# Patient Record
Sex: Male | Born: 1960 | Race: White | Hispanic: No | Smoking: Current every day smoker
Health system: Southern US, Community
[De-identification: ages and names within clinical notes are randomized; demographics above are authoritative.]

## PROBLEM LIST (undated history)

## (undated) DIAGNOSIS — I10 Essential (primary) hypertension: Secondary | ICD-10-CM

## (undated) DIAGNOSIS — C169 Malignant neoplasm of stomach, unspecified: Secondary | ICD-10-CM

## (undated) DIAGNOSIS — E7800 Pure hypercholesterolemia, unspecified: Secondary | ICD-10-CM

## (undated) DIAGNOSIS — I509 Heart failure, unspecified: Secondary | ICD-10-CM

## (undated) DIAGNOSIS — I219 Acute myocardial infarction, unspecified: Secondary | ICD-10-CM

## (undated) DIAGNOSIS — T8859XA Other complications of anesthesia, initial encounter: Secondary | ICD-10-CM

## (undated) DIAGNOSIS — M1990 Unspecified osteoarthritis, unspecified site: Secondary | ICD-10-CM

## (undated) DIAGNOSIS — I2510 Atherosclerotic heart disease of native coronary artery without angina pectoris: Secondary | ICD-10-CM

## (undated) DIAGNOSIS — Z87442 Personal history of urinary calculi: Secondary | ICD-10-CM

## (undated) DIAGNOSIS — Z9221 Personal history of antineoplastic chemotherapy: Secondary | ICD-10-CM

## (undated) DIAGNOSIS — C8300 Small cell B-cell lymphoma, unspecified site: Secondary | ICD-10-CM

## (undated) DIAGNOSIS — K219 Gastro-esophageal reflux disease without esophagitis: Secondary | ICD-10-CM

## (undated) HISTORY — DX: Pure hypercholesterolemia, unspecified: E78.00

## (undated) HISTORY — DX: Acute myocardial infarction, unspecified: I21.9

## (undated) HISTORY — PX: NASAL SINUS SURGERY: SHX719

## (undated) HISTORY — PX: KNEE SURGERY: SHX244

## (undated) HISTORY — PX: CORONARY ANGIOPLASTY WITH STENT PLACEMENT: SHX49

## (undated) HISTORY — DX: Essential (primary) hypertension: I10

## (undated) HISTORY — DX: Atherosclerotic heart disease of native coronary artery without angina pectoris: I25.10

## (undated) HISTORY — DX: Gastro-esophageal reflux disease without esophagitis: K21.9

## (undated) HISTORY — PX: TONSILLECTOMY AND ADENOIDECTOMY: SHX28

---

## 2001-07-30 DIAGNOSIS — I219 Acute myocardial infarction, unspecified: Secondary | ICD-10-CM

## 2001-07-30 HISTORY — DX: Acute myocardial infarction, unspecified: I21.9

## 2017-07-30 HISTORY — PX: OTHER SURGICAL HISTORY: SHX169

## 2018-10-08 ENCOUNTER — Encounter: Payer: Self-pay | Admitting: Gastroenterology

## 2018-10-22 ENCOUNTER — Ambulatory Visit: Payer: Self-pay | Admitting: Nurse Practitioner

## 2018-11-12 ENCOUNTER — Telehealth: Payer: Self-pay

## 2018-11-12 ENCOUNTER — Other Ambulatory Visit: Payer: Self-pay

## 2018-11-12 ENCOUNTER — Ambulatory Visit (INDEPENDENT_AMBULATORY_CARE_PROVIDER_SITE_OTHER): Payer: 59 | Admitting: Gastroenterology

## 2018-11-12 ENCOUNTER — Encounter: Payer: Self-pay | Admitting: Gastroenterology

## 2018-11-12 DIAGNOSIS — R1013 Epigastric pain: Secondary | ICD-10-CM

## 2018-11-12 DIAGNOSIS — R634 Abnormal weight loss: Secondary | ICD-10-CM

## 2018-11-12 NOTE — Progress Notes (Addendum)
Referring Physician: [facility]  Primary Care Physician:  Orpah Greek, MD  Primary GI: Dr. Gala Romney   Virtual Visit via Telephone Note Due to COVID-19, visit is conducted virtually and was requested by patient.   I connected with Patrick Rubio on 11/24/18 at  8:30 AM EDT by telephone and verified that I am speaking with the correct person using two identifiers.   I discussed the limitations, risks, security and privacy concerns of performing an evaluation and management service by telephone and the availability of in person appointments. I also discussed with the patient that there may be a patient responsible charge related to this service. The patient expressed understanding and agreed to proceed.  Chief Complaint  Patient presents with   Abdominal Pain    mid abd; feels like acid reflux; started after taking weekly shot for cholesterol/weight     History of Present Illness: 58 year old male here at the request of [facility] due to abdominal pain. No prior GI evaluation.   States onset of abdominal pain June 2019, after starting a cholesterol medication injection weekly that helped weight control, cholesterol. Part of a study. Started in June 2019. Takes the shot every Thursday. Took a drug holiday and was off for 2-3 weeks but still had issues. Doesn't remember name of medication.   Postprandial abdominal pain. Was already on an "antacid pill" taking once per day, then when pain started, got an OTC medication prior to supper along with antacid, which helped but didn't solve the problem. Instructed to take pepto, but didn't help. Protonix now BID. Not every day bad. Ate BLT sandwich yesterday. Last night flared up. Pain center of abdomen, above umbilicus, like a gas pain that is constant. In mornings when waking after eating hamburger and feels an acid reflux that wants to come up. Sometimes vomiting with this. Tastes greasy in saliva. Takes  Aleve two a day. Taking Protonix in mornings with other medications, then taking an hour or 30 minutes before supper.   Can go a few days without pain. Chronic GERD. GERD wakes up at night. Laying on back is worse. No dysphagia. 231 from starting study to 193. Supper around 5 or 530 and lays down around 9 or 930. Sometimes glass of milk in evenings or a cookie. No prior endoscopy or colonoscopy.     Past Medical History:  Diagnosis Date   CAD (coronary artery disease)    GERD (gastroesophageal reflux disease)    HTN (hypertension)    Hypercholesterolemia    MI (myocardial infarction) ([place])      Past Surgical History:  Procedure Laterality Date   CORONARY ANGIOPLASTY WITH STENT PLACEMENT     2003   KNEE SURGERY     lumbar fusion with cage  07/2017   NASAL SINUS SURGERY     TONSILLECTOMY AND ADENOIDECTOMY       Current Meds  Medication Sig   aspirin EC 81 MG tablet Take 81 mg by mouth every other day.    baclofen (LIORESAL) 10 MG tablet Take 10 mg by mouth 2 (two) times daily as needed (spasms).    clopidogrel (PLAVIX) 75 MG tablet Take 75 mg by mouth daily.    escitalopram (LEXAPRO) 20 MG tablet Take 20 mg by mouth daily.    gabapentin (NEURONTIN) 100 MG capsule Take 100 mg by mouth 2 (two) times a day.    lisinopril (PRINIVIL,ZESTRIL) 10 MG tablet Take 10 mg by mouth daily.    pantoprazole (  PROTONIX) 40 MG tablet Take 40 mg by mouth 2 (two) times daily.    simvastatin (ZOCOR) 40 MG tablet Take 40 mg by mouth every other day. At bedtime.   [DISCONTINUED] NON FORMULARY Weekly shot for cholesterol/weight. Pt unsure of name       Observations/Objective: No distress. Video available. Well-dressed, pleasant and cooperative.   Assessment and Plan: 58 year old male with chronic epigastric pain starting around June 2019 after enrolling in a study providing injectable cholesterol medication; he had a drug holiday but continued to note abdominal pain, intermittent  vomiting in the morning, and uncontrolled GERD despite BID PPI. Reported weight loss, which may be side effect secondary to new drug. Endorses NSAID use daily. Query gastritis, PUD, less likely occult malignancy. No dysphagia. Due to persistent dyspepsia despite BID dosing, will pursue diagnostic EGD. He is unsure the name of the medication but will be calling us in next 24 hours with name. As of note, will need routine screening colonoscopy later this year. No concerning lower GI signs/symptoms.  Proceed with upper endoscopy in the near future with Dr. Gala Romney. The risks, benefits, and alternatives have been discussed in detail with patient. They have stated understanding and desire to proceed.  GERD diet to be provided.  PPI BID Avoid NSAIDs Further recommendations to follow  ADDENDUM: medication is semaglutide. Black box warnings include potential risk of thyroid tumor. Patient without dysphagia. Reviewed adverse reactions. Potential for pancreatitis, cholelithiasis, more common reactions abdominal pain, constipation, N/V/D, appetite decrease. Could quite possibly have side effects due to medication; however, need to rule out occult etiology in setting of persistent dyspepsia, weight loss (which may be due to side effect of medication).   Follow Up Instructions: See AVS   I discussed the assessment and treatment plan with the patient. The patient was provided an opportunity to ask questions and all were answered. The patient agreed with the plan and demonstrated an understanding of the instructions.   The patient was advised to call back or seek an in-person evaluation if the symptoms worsen or if the condition fails to improve as anticipated.  I provided 20 minutes of face-to-face time during this encounter via video call.   [name], PhD, ANP-BC [facility]

## 2018-11-12 NOTE — Telephone Encounter (Signed)
Called pt, EGD w/RMR scheduled for 11/26/18 at 11:00am. Orders entered. Instructions mailed.

## 2018-11-12 NOTE — Patient Instructions (Addendum)
We have arranged an upper endoscopy with Dr. Gala Romney in the near future. You will need a routine screening colonoscopy in the future, but we will hold off on this until a more elective time.  I will try to get other blood work from your primary care.   I have included a handout for foods to avoid to help reduce reflux symptoms.   Please avoid Aleve or any other types of non-steroidal medication. You may take tylenol products instead.  Further recommendations to follow!  It was a pleasure to see you today. I strive to create trusting relationships with patients to provide genuine, compassionate, and quality care. I value your feedback. If you receive a survey regarding your visit,  I greatly appreciate you taking time to fill this out.   [name], PhD, ANP-BC [facility]

## 2018-11-12 NOTE — Telephone Encounter (Signed)
EGD approved. PA# [id number], 11/26/18-02/24/19.

## 2018-11-12 NOTE — Telephone Encounter (Signed)
PA for EGD submitted via [facility] website. Per [facility] website: Notification or Prior Authorization is not required for the requested services. In response to the COVID-19 national emergency, Site of Service prior authorization requirements have been suspended for this code(s) through 12/28/2018. Decision ID# [id number].  Case submitted. Notification/prior authorization reference number is [id number].

## 2018-11-13 ENCOUNTER — Telehealth: Payer: Self-pay | Admitting: Internal Medicine

## 2018-11-13 NOTE — Progress Notes (Signed)
CC'D TO PCP °

## 2018-11-13 NOTE — Telephone Encounter (Signed)
PATIENT CALLED AND SAID THE SHOT FROM HIS CARDIOLOGIST IS SEMAGLUTIDE 3.0MG 

## 2018-11-13 NOTE — Telephone Encounter (Signed)
Added to med list. FYI to AB.

## 2018-11-25 ENCOUNTER — Telehealth: Payer: Self-pay

## 2018-11-25 NOTE — Telephone Encounter (Signed)
EGD moved to 12:15pm tomorrow, arrive at 11:15am. Called pt to inform him, the hospital had already called and informed him.

## 2018-11-26 ENCOUNTER — Ambulatory Visit (HOSPITAL_COMMUNITY)
Admission: RE | Admit: 2018-11-26 | Discharge: 2018-11-26 | Disposition: A | Discharge status: Home / Self Care | Payer: 59 | Attending: Internal Medicine | Admitting: Internal Medicine

## 2018-11-26 ENCOUNTER — Encounter (HOSPITAL_COMMUNITY)
Admission: RE | Disposition: A | Discharge status: Home / Self Care | Payer: Self-pay | Source: Home / Self Care | Attending: Internal Medicine

## 2018-11-26 ENCOUNTER — Encounter (HOSPITAL_COMMUNITY): Payer: Self-pay | Admitting: *Deleted

## 2018-11-26 ENCOUNTER — Encounter (HOSPITAL_COMMUNITY): Payer: Self-pay | Admitting: Anesthesiology

## 2018-11-26 ENCOUNTER — Telehealth: Payer: Self-pay

## 2018-11-26 ENCOUNTER — Telehealth: Payer: Self-pay | Admitting: *Deleted

## 2018-11-26 ENCOUNTER — Other Ambulatory Visit: Payer: Self-pay

## 2018-11-26 DIAGNOSIS — F17210 Nicotine dependence, cigarettes, uncomplicated: Secondary | ICD-10-CM | POA: Diagnosis not present

## 2018-11-26 DIAGNOSIS — K209 Esophagitis, unspecified: Secondary | ICD-10-CM

## 2018-11-26 DIAGNOSIS — E7800 Pure hypercholesterolemia, unspecified: Secondary | ICD-10-CM | POA: Diagnosis not present

## 2018-11-26 DIAGNOSIS — I252 Old myocardial infarction: Secondary | ICD-10-CM | POA: Diagnosis not present

## 2018-11-26 DIAGNOSIS — I10 Essential (primary) hypertension: Secondary | ICD-10-CM | POA: Diagnosis not present

## 2018-11-26 DIAGNOSIS — I2510 Atherosclerotic heart disease of native coronary artery without angina pectoris: Secondary | ICD-10-CM | POA: Insufficient documentation

## 2018-11-26 DIAGNOSIS — Z7902 Long term (current) use of antithrombotics/antiplatelets: Secondary | ICD-10-CM | POA: Diagnosis not present

## 2018-11-26 DIAGNOSIS — Z7982 Long term (current) use of aspirin: Secondary | ICD-10-CM | POA: Diagnosis not present

## 2018-11-26 DIAGNOSIS — K210 Gastro-esophageal reflux disease with esophagitis: Secondary | ICD-10-CM | POA: Insufficient documentation

## 2018-11-26 DIAGNOSIS — K2970 Gastritis, unspecified, without bleeding: Secondary | ICD-10-CM

## 2018-11-26 DIAGNOSIS — Z79899 Other long term (current) drug therapy: Secondary | ICD-10-CM | POA: Insufficient documentation

## 2018-11-26 DIAGNOSIS — R1013 Epigastric pain: Secondary | ICD-10-CM

## 2018-11-26 DIAGNOSIS — R634 Abnormal weight loss: Secondary | ICD-10-CM

## 2018-11-26 DIAGNOSIS — K219 Gastro-esophageal reflux disease without esophagitis: Secondary | ICD-10-CM | POA: Diagnosis present

## 2018-11-26 HISTORY — PX: BIOPSY: SHX5522

## 2018-11-26 HISTORY — PX: ESOPHAGOGASTRODUODENOSCOPY: SHX5428

## 2018-11-26 SURGERY — EGD (ESOPHAGOGASTRODUODENOSCOPY)
Anesthesia: Moderate Sedation

## 2018-11-26 MED ORDER — MIDAZOLAM HCL 5 MG/5ML IJ SOLN
INTRAMUSCULAR | Status: AC
  Filled 2018-11-26: qty 10

## 2018-11-26 MED ORDER — MEPERIDINE HCL 50 MG/ML IJ SOLN
INTRAMUSCULAR | Status: AC
  Filled 2018-11-26: qty 1

## 2018-11-26 MED ORDER — LIDOCAINE VISCOUS HCL 2 % MT SOLN
OROMUCOSAL | Status: DC | PRN
  Administered 2018-11-26: 1 via OROMUCOSAL

## 2018-11-26 MED ORDER — ONDANSETRON HCL 4 MG/2ML IJ SOLN
INTRAMUSCULAR | Status: DC | PRN
  Administered 2018-11-26: 4 mg via INTRAVENOUS

## 2018-11-26 MED ORDER — ONDANSETRON HCL 4 MG/2ML IJ SOLN
INTRAMUSCULAR | Status: AC
  Filled 2018-11-26: qty 2

## 2018-11-26 MED ORDER — LIDOCAINE VISCOUS HCL 2 % MT SOLN
OROMUCOSAL | Status: AC
  Filled 2018-11-26: qty 15

## 2018-11-26 MED ORDER — STERILE WATER FOR IRRIGATION IR SOLN
Status: DC | PRN
  Administered 2018-11-26: 1.5 mL

## 2018-11-26 MED ORDER — SODIUM CHLORIDE 0.9 % IV SOLN
INTRAVENOUS | Status: DC
  Administered 2018-11-26: 12:00:00 via INTRAVENOUS

## 2018-11-26 MED ORDER — MIDAZOLAM HCL 5 MG/5ML IJ SOLN
INTRAMUSCULAR | Status: DC | PRN
  Administered 2018-11-26 (×2): 2 mg via INTRAVENOUS
  Administered 2018-11-26: 1 mg via INTRAVENOUS
  Administered 2018-11-26: 2 mg via INTRAVENOUS

## 2018-11-26 MED ORDER — MEPERIDINE HCL 100 MG/ML IJ SOLN
INTRAMUSCULAR | Status: DC | PRN
  Administered 2018-11-26: 25 mg via INTRAVENOUS
  Administered 2018-11-26: 15 mg via INTRAVENOUS

## 2018-11-26 NOTE — H&P (Signed)
@LOGO @   Primary Care Physician:  Orpah Greek, MD Primary Gastroenterologist:  Dr. Gala Romney  Pre-Procedure History & Physical: HPI:  Patrick Rubio is a 58 y.o. male here for upper abdominal pain and reflux symptoms for many years much worse in the past few months.  Had been on Protonix 40 mg once daily until a few months ago on his therapy was escalated to twice daily.  Denies dysphagia.  Has some intolerance to certain foods like cucumbers and other vegetables.  He does take NSAIDs daily.  Occasional alcohol.  He continues to smoke. Prior history of peptic ulcer disease.  Here for further evaluation via EGD.  Past Medical History:  Diagnosis Date  . CAD (coronary artery disease)   . GERD (gastroesophageal reflux disease)   . HTN (hypertension)   . Hypercholesterolemia   . MI (myocardial infarction) [facility])     Past Surgical History:  Procedure Laterality Date  . CORONARY ANGIOPLASTY WITH STENT PLACEMENT     2003  . KNEE SURGERY    . lumbar fusion with cage  07/2017  . NASAL SINUS SURGERY    . TONSILLECTOMY AND ADENOIDECTOMY      Prior to Admission medications   Medication Sig Start Date End Date Taking? Authorizing Provider  aspirin EC 81 MG tablet Take 81 mg by mouth every other day.    Yes [provider]  baclofen (LIORESAL) 10 MG tablet Take 10 mg by mouth 2 (two) times daily as needed (spasms).  09/29/18  Yes [provider]  clopidogrel (PLAVIX) 75 MG tablet Take 75 mg by mouth daily.  08/16/18  Yes [provider]  escitalopram (LEXAPRO) 20 MG tablet Take 20 mg by mouth daily.  10/06/18  Yes [provider]  gabapentin (NEURONTIN) 100 MG capsule Take 100 mg by mouth 2 (two) times a day.  09/02/18  Yes [provider]  lisinopril (PRINIVIL,ZESTRIL) 10 MG tablet Take 10 mg by mouth daily.  11/03/18  Yes [provider]  naproxen sodium (ALEVE) 220 MG tablet Take 220-440 mg by mouth 2 (two) times daily as needed (pain.).   Yes  [provider]  pantoprazole (PROTONIX) 40 MG tablet Take 40 mg by mouth 2 (two) times daily.  09/15/18  Yes [provider]  SEMAGLUTIDE, 1 MG/DOSE,  Inject 3 mg into the skin every Thursday. Receives this medication from [facility] as part of a research trial (3mg  dose is not commercially available)   Yes [provider]  simvastatin (ZOCOR) 40 MG tablet Take 40 mg by mouth every other day. At bedtime.    [provider]    Allergies as of 11/12/2018  . (No Known Allergies)    Family History  Problem Relation Age of Onset  . Colon polyps Father        thinks he may have had polyps, possibly in his 47s.   . Colon cancer Neg Hx   . Pancreatic cancer Neg Hx   . Liver disease Neg Hx     Social History   Socioeconomic History  . Marital status: Soil scientist    Spouse name: Not on file  . Number of children: Not on file  . Years of education: Not on file  . Highest education level: Not on file  Occupational History    Comment: part time at [employer]  Social Needs  . Financial resource strain: Not on file  . Food insecurity:    Worry: Not on file  Inability: Not on file  . Transportation needs:    Medical: Not on file    Non-medical: Not on file  Tobacco Use  . Smoking status: Current Every Day Smoker    Packs/day: 1.00    Types: Cigarettes  . Smokeless tobacco: Never Used  Substance and Sexual Activity  . Alcohol use: Yes    Comment: occ  . Drug use: Never  . Sexual activity: Not on file  Lifestyle  . Physical activity:    Days per week: Not on file    Minutes per session: Not on file  . Stress: Not on file  Relationships  . Social connections:    Talks on phone: Not on file    Gets together: Not on file    Attends religious service: Not on file    Active member of club or organization: Not on file    Attends meetings of clubs or organizations: Not on file    Relationship status: Not on file  .  Intimate partner violence:    Fear of current or ex partner: Not on file    Emotionally abused: Not on file    Physically abused: Not on file    Forced sexual activity: Not on file  Other Topics Concern  . Not on file  Social History Narrative  . Not on file    Review of Systems: See HPI, otherwise negative ROS  Physical Exam: BP (!) 128/95   Pulse 65   Temp 98.7 F (37.1 C) (Oral)   Resp 17   Ht 5\' 10"  (1.778 m)   Wt 87.5 kg   SpO2 100%   BMI 27.69 kg/m  General:   Alert,  Well-developed, well-nourished, pleasant and cooperative in NAD Neck:  Supple; no masses or thyromegaly. No significant cervical adenopathy. Lungs:  Clear throughout to auscultation.   No wheezes, crackles, or rhonchi. No acute distress. Heart:  Regular rate and rhythm; no murmurs, clicks, rubs,  or gallops. Abdomen: Non-distended, normal bowel sounds.  Soft and nontender without appreciable mass or hepatosplenomegaly.  Pulses:  Normal pulses noted. Extremities:  Without clubbing or edema.  Impression/Plan: Pleasant 58 year old gentleman with dyspepsia and reflux symptoms-crescendo.  Refractory to even twice daily PPI therapy although PPI therapy has improved his symptoms.  No dysphagia.  Daily NSAID use.  Agree with need for endoscopic evaluation at this time.  I have offered the patient a EGD today.  The risks, benefits, limitations, alternatives and imponderables have been reviewed with the patient. Potential for esophageal dilation, biopsy, etc. have also been reviewed.  Questions have been answered. All parties agreeable.  Notice: This dictation was prepared with Dragon dictation along with smaller phrase technology. Any transcriptional errors that result from this process are unintentional and may not be corrected upon review.

## 2018-11-26 NOTE — Discharge Instructions (Signed)
EGD Discharge instructions Please read the instructions outlined below and refer to this sheet in the next few weeks. These discharge instructions provide you with general information on caring for yourself after you leave the hospital. Your doctor may also give you specific instructions. While your treatment has been planned according to the most current medical practices available, unavoidable complications occasionally occur. If you have any problems or questions after discharge, please call your doctor. Dr Gala Romney:  [phone].  After hours and weekends call the hospital and have the GI doctor on call paged; they will call you back. ACTIVITY  You may resume your regular activity but move at a slower pace for the next 24 hours.   Take frequent rest periods for the next 24 hours.   Walking will help expel (get rid of) the air and reduce the bloated feeling in your abdomen.   No driving for 24 hours (because of the anesthesia (medicine) used during the test).   You may shower.   Do not sign any important legal documents or operate any machinery for 24 hours (because of the anesthesia used during the test).  NUTRITION  Drink plenty of fluids.   You may resume your normal diet.   Begin with a light meal and progress to your normal diet.   Avoid alcoholic beverages for 24 hours or as instructed by your caregiver.  MEDICATIONS  You may resume your normal medications unless your caregiver tells you otherwise.  WHAT YOU CAN EXPECT TODAY  You may experience abdominal discomfort such as a feeling of fullness or gas pains.  FOLLOW-UP  Your doctor will discuss the results of your test with you.  SEEK IMMEDIATE MEDICAL ATTENTION IF ANY OF THE FOLLOWING OCCUR:  Excessive nausea (feeling sick to your stomach) and/or vomiting.   Severe abdominal pain and distention (swelling).   Trouble swallowing.   Temperature over 101 F (37.8 C).   Rectal bleeding or vomiting of blood.   Stop  Protonix; begin a trial of Dexilant 60 mg daily  Further recommendations to follow pending review of pathology report (biopsies)  If symptoms not markedly improved with Dexilant, may need evaluation of your gallbladder (with ultrasound)  Office visit with [place] in 3 months. (Dr Rourk's office will call with appointment date/time)

## 2018-11-26 NOTE — Telephone Encounter (Signed)
Received called from [place]. Pt was seen by RMR and will need a 3 month APT. Please schedule and let pt know.

## 2018-11-26 NOTE — Telephone Encounter (Signed)
[facility] called. They received Rx from patient that states dexilan 60mg  1 tablet BID. Patient advised her he was told to take dexilant once a day. Looking at d/c summary from today it says dexilant 60mg  daily. Pharmacy still wants me to confirm with Dr. Gala Romney how patient should take medication. Please advise Thanks

## 2018-11-26 NOTE — Telephone Encounter (Signed)
Per RMR's procedure note, Dexilant 60 mg once daily. [facility] was notified.

## 2018-11-26 NOTE — Telephone Encounter (Signed)
Yes; should be once daily

## 2018-11-26 NOTE — Op Note (Signed)
[facility] Patient Name: Patrick Rubio Procedure Date: 11/26/2018 7:57 AM MRN: [id number] Date of Birth: Aug 29, 1960 Attending MD: Norvel Richards , MD CSN: [id number] Age: 58 Admit Type: Outpatient Procedure:                Upper GI endoscopy Indications:              Dyspepsia, Heartburn Providers:                Norvel Richards, MD, Charlsie Quest. Theda Sers RN, RN,                            Raphael Gibney Tech., Technician, Aram Candela Referring MD:              Medicines:                Midazolam 7 mg IV, Meperidine 40 mg IV, Ondansetron                            4 mg IV Complications:            No immediate complications. Estimated Blood Loss:     Estimated blood loss was minimal. Procedure:                Pre-Anesthesia Assessment:                           - Prior to the procedure, a History and Physical                            was performed, and patient medications and                            allergies were reviewed. The patient's tolerance of                            previous anesthesia was also reviewed. The risks                            and benefits of the procedure and the sedation                            options and risks were discussed with the patient.                            All questions were answered, and informed consent                            was obtained. Prior Anticoagulants: The patient                            last took Plavix (clopidogrel) on the day of the                            procedure. ASA Grade Assessment: II - A patient  with mild systemic disease. After reviewing the                            risks and benefits, the patient was deemed in                            satisfactory condition to undergo the procedure.                           After obtaining informed consent, the endoscope was                            passed under direct vision. Throughout the                             procedure, the patient's blood pressure, pulse, and                            oxygen saturations were monitored continuously. The                            GIF-[id number] ([id number]) was introduced through the                            mouth, and advanced to the second part of duodenum.                            The upper GI endoscopy was accomplished without                            difficulty. The patient tolerated the procedure                            well. Scope In: 1:08:58 PM Scope Out: 1:17:11 PM Total Procedure Duration: 0 hours 8 minutes 13 seconds  Findings:      Esophagitis was found. 3 tiny (less than 5 mm) erosion straddling the GE       junction. Undulating Z line. No Barrett's epithelium seen.      Patchy erythema of the gastric mucosa. Area of focal scar on the       anterior wall. Patchy gastric erythema. No ulcer or infiltrating process       seen. Mucosa including area of scar biopsied. Patent pylorus.      The duodenal bulb and second portion of the duodenum were normal. Impression:               -Mild erosive reflux esophagitis esophagitis.                            Gastric erythema and scar?"status post biopsy.                            Patient symptoms are out of proportion to  endoscopic findings. Patient has been on                            pantoprazole for many years. I wonder if he would                            benefit from switching to another PPI as a trial.                            Also, some of his postprandial epigastric symptoms                            sound a bit biliary in etiology to me. Significant                            NSAID gastropathy less likely given concomitant                            cyto-protection from twice daily PPI therapy.                           - Normal duodenal bulb and second portion of the                            duodenum.                           - No specimens  collected. Moderate Sedation:      Moderate (conscious) sedation was administered by the endoscopy nurse       and supervised by the endoscopist. The following parameters were       monitored: oxygen saturation, heart rate, blood pressure, respiratory       rate, EKG, adequacy of pulmonary ventilation, and response to care.       Total physician intraservice time was 20 minutes. Recommendation:           - Patient has a contact number available for                            emergencies. The signs and symptoms of potential                            delayed complications were discussed with the                            patient. Return to normal activities tomorrow.                            Written discharge instructions were provided to the                            patient.                           - Advance diet as tolerated.                           -  Stop Protonix. Begin Dexilant 60 mg daily. Patient                            will report back to me in 2 weeks and let me know                            how he is doing on this regimen. At patient's                            request, I have discussed my findings and                            recommendations with Shelly Flatten at                            [phone].                           - Await pathology results. Depending on his                            response to Dexilant, may consider further                            evaluation i.e. gallbladder ultrasound in the near                            future. Procedure Code(s):        --- Professional ---                           [phone], Esophagogastroduodenoscopy, flexible,                            transoral; diagnostic, including collection of                            specimen(s) by brushing or washing, when performed                            (separate procedure)                           G0500, Moderate sedation services provided by the                             same physician or other qualified health care                            professional performing a gastrointestinal                            endoscopic service that sedation supports,                            requiring  the presence of an independent trained                            observer to assist in the monitoring of the                            patient's level of consciousness and physiological                            status; initial 15 minutes of intra-service time;                            patient age 57 years or older (additional time may                            be reported with [phone], as appropriate) Diagnosis Code(s):        --- Professional ---                           K20.9, Esophagitis, unspecified                           R10.13, Epigastric pain                           R12, Heartburn CPT copyright 2019 American Medical Association. All rights reserved. The codes documented in this report are preliminary and upon coder review may  be revised to meet current compliance requirements. [name]. Endia Moncur, MD Norvel Richards, MD 11/26/2018 1:40:40 PM This report has been signed electronically. Number of Addenda: 0

## 2018-11-26 NOTE — Telephone Encounter (Signed)
Noted, AM notified pharmacy.

## 2018-11-27 ENCOUNTER — Encounter: Payer: Self-pay | Admitting: Internal Medicine

## 2018-11-27 NOTE — Telephone Encounter (Signed)
SCHEDULED PATIENT AND SENT LETTER  °

## 2018-11-28 ENCOUNTER — Encounter (HOSPITAL_COMMUNITY): Payer: Self-pay | Admitting: Internal Medicine

## 2018-11-30 ENCOUNTER — Encounter: Payer: Self-pay | Admitting: Internal Medicine

## 2018-12-30 ENCOUNTER — Ambulatory Visit: Payer: Self-pay | Admitting: Gastroenterology

## 2019-03-11 ENCOUNTER — Encounter: Payer: Self-pay | Admitting: *Deleted

## 2019-03-11 ENCOUNTER — Ambulatory Visit: Payer: 59 | Admitting: Gastroenterology

## 2019-03-11 ENCOUNTER — Encounter: Payer: Self-pay | Admitting: Gastroenterology

## 2019-03-11 ENCOUNTER — Ambulatory Visit (HOSPITAL_COMMUNITY)
Admission: RE | Admit: 2019-03-11 | Discharge: 2019-03-11 | Disposition: A | Discharge status: Home / Self Care | Payer: 59 | Source: Ambulatory Visit | Attending: Gastroenterology | Admitting: Gastroenterology

## 2019-03-11 ENCOUNTER — Other Ambulatory Visit: Payer: Self-pay

## 2019-03-11 DIAGNOSIS — R1011 Right upper quadrant pain: Secondary | ICD-10-CM

## 2019-03-11 NOTE — Progress Notes (Signed)
Referring Provider: Orpah Greek, MD Primary Care Physician:  Orpah Greek, MD  Primary GI: Dr. Gala Romney   Chief Complaint  Patient presents with  . dyspepsia    f/u    HPI:   Patrick Rubio is a 58 y.o. male presenting today with a history of dyspepsia, s/p EGD  April 2020: mild erosive reflux esophagitis, gastric erythema. Negative H.pylori. Had been on semaglutide last year when abdominal pain and vomiting started. Medication was stopped for a month then resumed. Black box warnings include potential risk of thyroid tumor. Patient without dysphagia at that time. Symptoms continued despite stopping. Remains on semaglutide.   Gallbladder present. Dexilant without improvement. Protonix BID. Tagemet BID. Right-sided abdominal pain. Worse after eating. Intermittent, waxes and wanes. Worsened with high fat, greasy foods. Salad will also cause pain. Associated N/V every morning. No fever or chills. No [place] on file. Blood work greater than 3 months ago.   Past Medical History:  Diagnosis Date  . CAD (coronary artery disease)   . GERD (gastroesophageal reflux disease)   . HTN (hypertension)   . Hypercholesterolemia   . MI (myocardial infarction) [facility])     Past Surgical History:  Procedure Laterality Date  . BIOPSY  11/26/2018   Procedure: BIOPSY;  Surgeon: Daneil Dolin, MD;  Location: [facility];  Service: Endoscopy;;  . CORONARY ANGIOPLASTY WITH STENT PLACEMENT     2003  . ESOPHAGOGASTRODUODENOSCOPY N/A 11/26/2018   mild erosive reflux esophagitis, gastric erythema. Negative H.pylori   . KNEE SURGERY    . lumbar fusion with cage  07/2017  . NASAL SINUS SURGERY    . TONSILLECTOMY AND ADENOIDECTOMY      Current Outpatient Medications  Medication Sig Dispense Refill  . aspirin EC 81 MG tablet Take 81 mg by mouth 3 (three) times a week. Takes Mon, Wed, Fri    . baclofen (LIORESAL) 10 MG tablet Take 10 mg by mouth 2 (two) times daily as needed (spasms).     . cimetidine  (TAGAMET) 200 MG tablet Take 200 mg by mouth 2 (two) times daily.    . clopidogrel (PLAVIX) 75 MG tablet Take 75 mg by mouth daily.     [name] escitalopram (LEXAPRO) 20 MG tablet Take 20 mg by mouth daily.     [name] gabapentin (NEURONTIN) 100 MG capsule Take 100 mg by mouth as needed.     [name] lisinopril (PRINIVIL,ZESTRIL) 10 MG tablet Take 10 mg by mouth daily.     . naproxen sodium (ALEVE) 220 MG tablet Take 220-440 mg by mouth 2 (two) times daily as needed (pain.).    [name] pantoprazole (PROTONIX) 40 MG tablet Take 40 mg by mouth 2 (two) times daily.    [name] SEMAGLUTIDE, 1 MG/DOSE, [place] Inject 3 mg into the skin every Thursday. Receives this medication from [facility] as part of a research trial (3mg  dose is not commercially available)    . simvastatin (ZOCOR) 40 MG tablet Take 40 mg by mouth every other day. At bedtime.     No current facility-administered medications for this visit.     Allergies as of 03/11/2019  . (No Known Allergies)    Family History  Problem Relation Age of Onset  . Colon polyps Father        thinks he may have had polyps, possibly in his 75s.   . Colon cancer Neg Hx   . Pancreatic cancer Neg Hx   . Liver disease Neg Hx  Social History   Socioeconomic History  . Marital status: Soil scientist    Spouse name: Not on file  . Number of children: Not on file  . Years of education: Not on file  . Highest education level: Not on file  Occupational History    Comment: part time at [employer]  Social Needs  . Financial resource strain: Not on file  . Food insecurity    Worry: Not on file    Inability: Not on file  . Transportation needs    Medical: Not on file    Non-medical: Not on file  Tobacco Use  . Smoking status: Current Every Day Smoker    Packs/day: 1.00    Types: Cigarettes  . Smokeless tobacco: Never Used  Substance and Sexual Activity  . Alcohol use: Yes    Comment: occ  . Drug use: Never  . Sexual activity: Not on file   Lifestyle  . Physical activity    Days per week: Not on file    Minutes per session: Not on file  . Stress: Not on file  Relationships  . Social Herbalist on phone: Not on file    Gets together: Not on file    Attends religious service: Not on file    Active member of club or organization: Not on file    Attends meetings of clubs or organizations: Not on file    Relationship status: Not on file  Other Topics Concern  . Not on file  Social History Narrative  . Not on file    Review of Systems: Gen: Denies fever, chills, anorexia. Denies fatigue, weakness, weight loss.  CV: Denies chest pain, palpitations, syncope, peripheral edema, and claudication. Resp: Denies dyspnea at rest, cough, wheezing, coughing up blood, and pleurisy. GI: see HPI Derm: Denies rash, itching, dry skin Psych: Denies depression, anxiety, memory loss, confusion. No homicidal or suicidal ideation.  Heme: Denies bruising, bleeding, and enlarged lymph nodes.  Physical Exam: BP 111/76   Pulse 70   Temp (!) 97.1 F (36.2 C) (Temporal)   Ht 5\' 10"  (1.778 m)   Wt 184 lb 9.6 oz (83.7 kg)   BMI 26.49 kg/m  General:   Alert and oriented. No distress noted. Pleasant and cooperative.  Head:  Normocephalic and atraumatic. Eyes:  Conjuctiva clear without scleral icterus. Mouth:  Oral mucosa pink and moist.  Abdomen:  +BS, soft, TTP RUQ and non-distended. No rebound or guarding. No HSM or masses noted. Msk:  Symmetrical without gross deformities. Normal posture. Extremities:  Without edema. Neurologic:  Alert and  oriented x4 Psych:  Alert and cooperative. Normal mood and affect.

## 2019-03-11 NOTE — Assessment & Plan Note (Signed)
58 year old male with postprandial RUQ pain, associated N/V, with EGD on file. Symptoms concerning for biliary etiology. As of note, he has been on semaglutide since last year, even taking a drug holiday due to symptoms but still persisted. Remains on this. Potential side effects including cholelithiasis and pancreatitis. Pursuing CBC, CMP, lipase today. RUQ [place] as soon as possible. HIDA if normal RUQ [place]. Continue PPI for now. Further recommendations to follow.  At some point when acute illness is addressed, needs initial screening colonoscopy.

## 2019-03-11 NOTE — Patient Instructions (Signed)
Please have blood work done today.   We have ordered an ultrasound of your gallbladder.  If severe pain, nausea, vomiting, fever, chills, yellowing of skin or eyes, please go to the emergency room.  Avoid the foods that you know will trigger the pain.  Continue with Protonix for now.  Further recommendations to follow!  I enjoyed seeing you again today! As you know, I value our relationship and want to provide genuine, compassionate, and quality care. I welcome your feedback. If you receive a survey regarding your visit,  I greatly appreciate you taking time to fill this out. See you next time!  [name], PhD, ANP-BC [facility]

## 2019-03-12 ENCOUNTER — Other Ambulatory Visit (HOSPITAL_COMMUNITY)
Admission: RE | Admit: 2019-03-12 | Discharge: 2019-03-12 | Disposition: A | Discharge status: Home / Self Care | Payer: 59 | Source: Ambulatory Visit | Attending: Gastroenterology | Admitting: Gastroenterology

## 2019-03-12 ENCOUNTER — Other Ambulatory Visit: Payer: Self-pay | Admitting: *Deleted

## 2019-03-12 DIAGNOSIS — R1011 Right upper quadrant pain: Secondary | ICD-10-CM | POA: Diagnosis not present

## 2019-03-12 LAB — CBC WITH DIFFERENTIAL/PLATELET
Abs Immature Granulocytes: 0.03 10*3/uL (ref 0.00–0.07)
Basophils Absolute: 0.1 10*3/uL (ref 0.0–0.1)
Basophils Relative: 1 %
Eosinophils Absolute: 0.4 10*3/uL (ref 0.0–0.5)
Eosinophils Relative: 4 %
HCT: 40.7 % (ref 39.0–52.0)
Hemoglobin: 13.2 g/dL (ref 13.0–17.0)
Immature Granulocytes: 0 %
Lymphocytes Relative: 32 %
Lymphs Abs: 2.7 10*3/uL (ref 0.7–4.0)
MCH: 28.7 pg (ref 26.0–34.0)
MCHC: 32.4 g/dL (ref 30.0–36.0)
MCV: 88.5 fL (ref 80.0–100.0)
Monocytes Absolute: 0.5 10*3/uL (ref 0.1–1.0)
Monocytes Relative: 6 %
Neutro Abs: 4.9 10*3/uL (ref 1.7–7.7)
Neutrophils Relative %: 57 %
Platelets: 189 10*3/uL (ref 150–400)
RBC: 4.6 MIL/uL (ref 4.22–5.81)
RDW: 13.5 % (ref 11.5–15.5)
WBC: 8.7 10*3/uL (ref 4.0–10.5)
nRBC: 0 % (ref 0.0–0.2)

## 2019-03-12 LAB — COMPREHENSIVE METABOLIC PANEL
ALT: 11 U/L (ref 0–44)
AST: 15 U/L (ref 15–41)
Albumin: 3.8 g/dL (ref 3.5–5.0)
Alkaline Phosphatase: 52 U/L (ref 38–126)
Anion gap: 7 (ref 5–15)
BUN: 17 mg/dL (ref 6–20)
CO2: 24 mmol/L (ref 22–32)
Calcium: 8.6 mg/dL — ABNORMAL LOW (ref 8.9–10.3)
Chloride: 105 mmol/L (ref 98–111)
Creatinine, Ser: 0.8 mg/dL (ref 0.61–1.24)
GFR calc Af Amer: >60 mL/min (ref >60)
GFR calc non Af Amer: >60 mL/min (ref >60)
Glucose, Bld: 75 mg/dL (ref 70–99)
Potassium: 3.8 mmol/L (ref 3.5–5.1)
Sodium: 136 mmol/L (ref 135–145)
Total Bilirubin: 0.9 mg/dL (ref 0.3–1.2)
Total Protein: 6.4 g/dL — ABNORMAL LOW (ref 6.5–8.1)

## 2019-03-12 LAB — LIPASE, BLOOD: Lipase: 41 U/L (ref 11–51)

## 2019-03-12 NOTE — Progress Notes (Signed)
CBC, CMP, lipase normal. [place] without stones but did have gallbladder polyp. HIDA recommended and sent to [facility] clinical pool.

## 2019-03-12 NOTE — Progress Notes (Signed)
No gallstones. Need to pursue HIDA within the next week due to high concern for biliary dyskinesia. RUQ pain.

## 2019-03-17 ENCOUNTER — Telehealth: Payer: Self-pay | Admitting: *Deleted

## 2019-03-17 ENCOUNTER — Other Ambulatory Visit: Payer: Self-pay

## 2019-03-17 ENCOUNTER — Encounter (HOSPITAL_COMMUNITY)
Admission: RE | Admit: 2019-03-17 | Discharge: 2019-03-17 | Disposition: A | Discharge status: Home / Self Care | Payer: 59 | Source: Ambulatory Visit | Attending: Gastroenterology | Admitting: Gastroenterology

## 2019-03-17 DIAGNOSIS — R634 Abnormal weight loss: Secondary | ICD-10-CM

## 2019-03-17 DIAGNOSIS — R1011 Right upper quadrant pain: Secondary | ICD-10-CM | POA: Diagnosis present

## 2019-03-17 MED ORDER — TECHNETIUM TC 99M MEBROFENIN IV KIT
5.0000 | PACK | Freq: Once | INTRAVENOUS | Status: AC | PRN
  Administered 2019-03-17: 08:00:00 5.45 via INTRAVENOUS

## 2019-03-17 NOTE — Telephone Encounter (Addendum)
PA for CT abd/pelvis done via [facility] website. Received message: "Submit Clinical Request Based on the clinical information provided, the request has not demonstrated consistency with evidence-based clinical guidelines.  A Physician-to-Physician discussion is required to complete the Notification Process. The ordering provider must call [phone] and select option #3 to engage in a Physician-to-Physician discussion. Please ensure the physician has the case number provided when making this call. If a Physician-to-Physician discussion is not completed within 3 business days, the notification number request will be deemed expired and you must reinitiate the Notification Process.  Please click submit to receive your case number."  Case# [id number]  Patient CT scheduled for 8/31  Please advise AB thanks

## 2019-03-17 NOTE — Progress Notes (Signed)
Pt is aware of the results.  He feels he is holding his own on his weight for now.  OK to schedule CT scan and then after that see if he needs referral to surgeon.

## 2019-03-17 NOTE — Progress Notes (Signed)
[facility] clinical pool: please arrange CT abd/pelvis with contrast due to RUQ pain, history of weight loss.

## 2019-03-17 NOTE — Progress Notes (Signed)
cc'ed to pcp °

## 2019-03-17 NOTE — Progress Notes (Signed)
HIDA scan is normal. Symptoms point towards biliary etiology despite normal HIDA. HOWEVER: is he still losing weight? He reported to me a weight of 193 during video visit but now 184. Highest weight in 230s per his report. We can refer to General Surgery now for cholecystectomy, but I feel it would be remiss not to pursue CT abd/pelvis to rule out any other occult etiology. Labs are normal, but weight loss is concerning.

## 2019-03-18 ENCOUNTER — Other Ambulatory Visit (HOSPITAL_COMMUNITY): Payer: Self-pay | Admitting: "Endocrinology

## 2019-03-19 NOTE — Telephone Encounter (Signed)
Approval: [id number]  Good for 45 days. Expires Oct 4th, 2020.

## 2019-03-30 ENCOUNTER — Ambulatory Visit (HOSPITAL_COMMUNITY)
Admission: RE | Admit: 2019-03-30 | Discharge: 2019-03-30 | Disposition: A | Discharge status: Home / Self Care | Payer: 59 | Source: Ambulatory Visit | Attending: Gastroenterology | Admitting: Gastroenterology

## 2019-03-30 ENCOUNTER — Other Ambulatory Visit: Payer: Self-pay

## 2019-03-30 DIAGNOSIS — R1011 Right upper quadrant pain: Secondary | ICD-10-CM

## 2019-03-30 DIAGNOSIS — R634 Abnormal weight loss: Secondary | ICD-10-CM

## 2019-03-30 MED ORDER — IOHEXOL 300 MG/ML  SOLN
100.0000 mL | Freq: Once | INTRAMUSCULAR | Status: AC | PRN
  Administered 2019-03-30: 100 mL via INTRAVENOUS

## 2019-03-31 NOTE — Progress Notes (Signed)
Reviewed findings. Extensive mesenteric and retroperitoneal adenopathy. I have already reached out to Dr. Delton Coombes. Concerning for lymphoma. Oncology has already arranged an appointment for THIS week. I appreciate the great team work and expedited care. I have spoken with patient personally, and he is aware of appointment on Thursday as well.

## 2019-03-31 NOTE — Telephone Encounter (Signed)
Addressed under result note 

## 2019-04-01 ENCOUNTER — Encounter (HOSPITAL_COMMUNITY): Payer: Self-pay

## 2019-04-02 ENCOUNTER — Other Ambulatory Visit: Payer: Self-pay

## 2019-04-02 ENCOUNTER — Inpatient Hospital Stay (HOSPITAL_COMMUNITY): Payer: 59 | Attending: Hematology | Admitting: Hematology

## 2019-04-02 ENCOUNTER — Encounter (HOSPITAL_COMMUNITY): Payer: Self-pay | Admitting: Hematology

## 2019-04-02 VITALS — BP 117/77 | HR 78 | Temp 97.7°F | Resp 18 | Wt 181.6 lb

## 2019-04-02 DIAGNOSIS — R591 Generalized enlarged lymph nodes: Secondary | ICD-10-CM | POA: Diagnosis not present

## 2019-04-02 DIAGNOSIS — R634 Abnormal weight loss: Secondary | ICD-10-CM | POA: Diagnosis not present

## 2019-04-02 DIAGNOSIS — R590 Localized enlarged lymph nodes: Secondary | ICD-10-CM | POA: Insufficient documentation

## 2019-04-02 DIAGNOSIS — F17210 Nicotine dependence, cigarettes, uncomplicated: Secondary | ICD-10-CM | POA: Diagnosis not present

## 2019-04-02 DIAGNOSIS — R1011 Right upper quadrant pain: Secondary | ICD-10-CM | POA: Diagnosis not present

## 2019-04-02 DIAGNOSIS — R599 Enlarged lymph nodes, unspecified: Secondary | ICD-10-CM

## 2019-04-02 NOTE — Progress Notes (Signed)
[facility] NOTE  Patient Care Team: Orpah Greek, MD as PCP - General (Internal Medicine) Gala Romney [name], MD as Consulting Physician (Gastroenterology)  CHIEF COMPLAINTS/PURPOSE OF CONSULTATION:  Mesenteric and retroperitoneal lymphadenopathy.  HISTORY OF PRESENTING ILLNESS:  Patrick Rubio 58 y.o. male is seen in consultation today for further work-up and management of mesenteric and retroperitoneal lymphadenopathy found on the CT scan dated 03/30/2019.  He reported 56 pound weight loss in the last 1 and half year.  Denied any fevers or night sweats.  For the past 4 months, he has been having right upper quadrant pain, constant, achy.  It gets worse on eating.  It is rated a 7 out of 10.  The pain is better when he lies on the left side.  He was evaluated by Roseanne Kaufman, NP in GI clinic and was referred to [place] for further evaluation.  A preliminary ultrasound of the abdomen showed no gallstones.  Gallbladder polyps was seen.  Nuclear scan showed normal ejection fraction of the gallbladder.  Subsequent CT scan of the abdomen and pelvis was ordered due to extensive weight loss which showed enlarged retroperitoneal and extensive mesenteric adenopathy.  He does report decrease in appetite.  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  He sustained a vertebral fracture and was out of work for 2 and half years.  He is working part-time at a funeral home.  He is a current active smoker, 1 pack/day for the past 17 years.  Family history significant for daughter with kidney cancer.  Maternal grandmother had cancer, patient does not know the type.  He reports having history of MI with stent placement in 2002.  Denies any strokes.  Denies any bleeding per rectum or melena.  Denies any nausea or vomiting.  No lightheadedness or chest pains reported.  No headaches were reported.  Appetite is 50%.  Energy levels are 100%.  MEDICAL HISTORY:  Past  Medical History:  Diagnosis Date  . CAD (coronary artery disease)   . GERD (gastroesophageal reflux disease)   . HTN (hypertension)   . Hypercholesterolemia   . MI (myocardial infarction) ([place])     SURGICAL HISTORY: Past Surgical History:  Procedure Laterality Date  . BIOPSY  11/26/2018   Procedure: BIOPSY;  Surgeon: Daneil Dolin, MD;  Location: [facility];  Service: Endoscopy;;  . CORONARY ANGIOPLASTY WITH STENT PLACEMENT     2003  . ESOPHAGOGASTRODUODENOSCOPY N/A 11/26/2018   mild erosive reflux esophagitis, gastric erythema. Negative H.pylori   . KNEE SURGERY    . lumbar fusion with cage  07/2017  . NASAL SINUS SURGERY    . TONSILLECTOMY AND ADENOIDECTOMY      SOCIAL HISTORY: Social History   Socioeconomic History  . Marital status: Divorced    Spouse name: Not on file  . Number of children: 2  . Years of education: Not on file  . Highest education level: Not on file  Occupational History  . Occupation: employed    Comment: part time at [employer]  . Financial resource strain: Not hard at all  . Food insecurity    Worry: Never true    Inability: Never true  . Transportation needs    Medical: No    Non-medical: No  Tobacco Use  . Smoking status: Current Every Day Smoker    Packs/day: 0.50    Types: Cigarettes  . Smokeless tobacco: Never Used  Substance and Sexual Activity  . Alcohol use: Yes    Comment: occ  . Drug use: Never  . Sexual activity: Yes  Lifestyle  . Physical activity    Days per week: 4 days    Minutes per session: 60 min  . Stress: Not at all  Relationships  . Social connections    Talks on phone: More than three times a week    Gets together: More than three times a week    Attends religious service: More than 4 times per year    Active member of club or organization: Yes    Attends meetings of clubs or organizations: 1 to 4 times per year    Relationship status: Divorced  . Intimate partner violence    Fear of  current or ex partner: No    Emotionally abused: No    Physically abused: No    Forced sexual activity: No  Other Topics Concern  . Not on file  Social History Narrative  . Not on file    FAMILY HISTORY: Family History  Problem Relation Age of Onset  . Colon polyps Father        thinks he may have had polyps, possibly in his 2s.   [name] Heart attack Father   . Dementia Mother   . Hypotension Mother   . Stroke Sister   . Diabetes Maternal Grandmother   . Stroke Maternal Grandfather   . Cancer Paternal Grandmother   . Congestive Heart Failure Paternal Grandfather   . Kidney cancer Daughter   . Colon cancer Neg Hx   . Pancreatic cancer Neg Hx   . Liver disease Neg Hx     ALLERGIES:  has No Known Allergies.  MEDICATIONS:  Current Outpatient Medications  Medication Sig Dispense Refill  . aspirin EC 81 MG tablet Take 81 mg by mouth 3 (three) times a week. Takes Mon, Wed, Fri    . baclofen (LIORESAL) 10 MG tablet Take 10 mg by mouth 2 (two) times daily as needed (spasms).     . cimetidine (TAGAMET) 200 MG tablet Take 200 mg by mouth 2 (two) times daily.    . clopidogrel (PLAVIX) 75 MG tablet Take 75 mg by mouth daily.     [name] escitalopram (LEXAPRO) 20 MG tablet Take 20 mg by mouth daily.     [name] gabapentin (NEURONTIN) 100 MG capsule Take 100 mg by mouth as needed.     [name] lisinopril (PRINIVIL,ZESTRIL) 10 MG tablet Take 10 mg by mouth daily.     . pantoprazole (PROTONIX) 40 MG tablet Take 40 mg by mouth 2 (two) times daily.    [name] SEMAGLUTIDE, 1 MG/DOSE, [place] Inject 3 mg into the skin every Thursday. Receives this medication from [facility] as part of a research trial (3mg  dose is not commercially available)    . simvastatin (ZOCOR) 40 MG tablet Take 40 mg by mouth every other day. At bedtime.    . naproxen sodium (ALEVE) 220 MG tablet Take 220-440 mg by mouth 2 (two) times daily as needed (pain.).     No current facility-administered medications for this visit.      REVIEW OF SYSTEMS:   Constitutional: Denies fevers, chills or abnormal night sweats.  Significant weight loss of 56 pounds in the last 1 and half year. Eyes: Denies blurriness of vision, double vision or watery eyes Ears, nose, mouth, throat, and face: Denies mucositis or sore throat Respiratory: Denies cough, dyspnea or wheezes Cardiovascular: Denies palpitation,  chest discomfort or lower extremity swelling Gastrointestinal: Right upper quadrant constant pain for the past 4 months.  No change in bowel habits. Skin: Denies abnormal skin rashes Lymphatics: Denies new lymphadenopathy or easy bruising Neurological:Denies numbness, tingling or new weaknesses Behavioral/Psych: Mood is stable, no new changes  All other systems were reviewed with the patient and are negative.  PHYSICAL EXAMINATION: ECOG PERFORMANCE STATUS: 0 - Asymptomatic  Vitals:   04/02/19 1521  BP: 117/77  Pulse: 78  Resp: 18  Temp: 97.7 F (36.5 C)  SpO2: 99%   Filed Weights   04/02/19 1521  Weight: 181 lb 9.6 oz (82.4 kg)    GENERAL:alert, no distress and comfortable SKIN: skin color, texture, turgor are normal, no rashes or significant lesions EYES: normal, conjunctiva are pink and non-injected, sclera clear OROPHARYNX:no exudate, no erythema and lips, buccal mucosa, and tongue normal  NECK: supple, thyroid normal size, non-tender, without nodularity LYMPH:  no palpable lymphadenopathy in the cervical, axillary or inguinal LUNGS: clear to auscultation and percussion with normal breathing effort HEART: regular rate & rhythm and no murmurs and no lower extremity edema ABDOMEN:abdomen soft, non-tender and normal bowel sounds Musculoskeletal:no cyanosis of digits and no clubbing  PSYCH: alert & oriented x 3 with fluent speech NEURO: no focal motor/sensory deficits - Very mild right axillary adenopathy.  LABORATORY DATA:  I have reviewed the data as listed Lab Results  Component Value Date   WBC 8.7  03/12/2019   HGB 13.2 03/12/2019   HCT 40.7 03/12/2019   MCV 88.5 03/12/2019   PLT 189 03/12/2019     Chemistry      Component Value Date/Time   NA 136 03/12/2019 0847   K 3.8 03/12/2019 0847   CL 105 03/12/2019 0847   CO2 24 03/12/2019 0847   BUN 17 03/12/2019 0847   CREATININE 0.80 03/12/2019 0847      Component Value Date/Time   CALCIUM 8.6 (L) 03/12/2019 0847   ALKPHOS 52 03/12/2019 0847   AST 15 03/12/2019 0847   ALT 11 03/12/2019 0847   BILITOT 0.9 03/12/2019 0847       RADIOGRAPHIC STUDIES: I have personally reviewed the radiological images as listed and agreed with the findings in the report. Ct Abdomen Pelvis W Contrast  Result Date: 03/30/2019 CLINICAL DATA:  Abdominal pain and unspecified weight loss. EXAM: CT ABDOMEN AND PELVIS WITH CONTRAST TECHNIQUE: Multidetector CT imaging of the abdomen and pelvis was performed using the standard protocol following bolus administration of intravenous contrast. CONTRAST:  111mL OMNIPAQUE IOHEXOL 300 MG/ML  SOLN COMPARISON:  None. FINDINGS: Lower chest: No acute abnormality. Hepatobiliary: No focal liver abnormality is seen. No gallstones, gallbladder wall thickening, or biliary dilatation. Pancreas: Unremarkable. No pancreatic ductal dilatation or surrounding inflammatory changes. Spleen: Normal in size without focal abnormality. Adrenals/Urinary Tract: Normal appearance of the adrenal glands. Left kidney cyst measures 1.6 cm. Several additional small low attenuation foci in the left kidney are too small to reliably characterize measuring less than 1 cm. No hydronephrosis or mass identified. The urinary bladder appears normal. Stomach/Bowel: Stomach is within normal limits. No evidence of bowel wall thickening, distention, or inflammatory changes. Vascular/Lymphatic: Aortic atherosclerosis.  No aneurysm. Bulky abdominal adenopathy is identified. There is extensive mesenteric adenopathy. Index lymph node within the ileo jejunal nodal  region has a short axis of 2.7 cm, image 37/2. Central small bowel mesenteric lymph node has a short axis of 2.4 cm, image 43/2. Enlarged retroperitoneal lymph nodes are identified. Index retrocaval node measures  1.6 cm, image 32/2. Index left retroperitoneal lymph node measures 1.6 cm, image 37/2. No pelvic or inguinal adenopathy identified. Reproductive: Prostate is unremarkable. Other: No ascites or fluid collections. Small fat containing inguinal hernias. Musculoskeletal: Postop change from lumbar decompression and posterior hardware fixation identified. IMPRESSION: 1. Exam positive for extensive abdominal mesenteric and retroperitoneal adenopathy. Findings concerning for lymphoproliferative disorder. Suggest further evaluation with PET-CT and tissue sampling. 2. Left kidney cyst. Electronically Signed   By: Kerby Moors M.D.   On: 03/30/2019 16:09   Nm Hepato W/eject Fract  Result Date: 03/17/2019 CLINICAL DATA:  Right upper quadrant pain with nausea and vomiting EXAM: NUCLEAR MEDICINE HEPATOBILIARY IMAGING WITH GALLBLADDER EF VIEWS: Anterior right upper quadrant RADIOPHARMACEUTICALS:  5.45 mCi Tc-37m  Choletec IV COMPARISON:  None. FINDINGS: Liver uptake of radiotracer is unremarkable. There is prompt visualization of gallbladder and small bowel, indicating patency of the cystic and common bile ducts. Patient consumed 8 ounces of Ensure orally with calculation of the computer generated ejection fraction of radiotracer from the gallbladder. Patient did not experience clinical symptoms with the oral Ensure consumption. The computer generated ejection fraction of radiotracer the gallbladder is normal at 64%, normal greater than 33% using the oral agent. IMPRESSION: Study within normal limits. Electronically Signed   By: Lowella Grip III M.D.   On: 03/17/2019 12:40   US Abdomen Limited Ruq  Result Date: 03/11/2019 CLINICAL DATA:  Postprandial right upper quadrant abdominal pain, nausea and vomiting.  EXAM: ULTRASOUND ABDOMEN LIMITED RIGHT UPPER QUADRANT COMPARISON:  None. FINDINGS: Gallbladder: No shadowing gallstones. No gallbladder wall thickening. No pericholecystic fluid. No gallbladder distention. No sonographic Murphy sign. Few scattered gallbladder polyps, largest 6 mm. Common bile duct: Diameter: 4 mm Liver: No focal lesion identified. Within normal limits in parenchymal echogenicity. Portal vein is patent on color Doppler imaging with normal direction of blood flow towards the liver. Other: None. IMPRESSION: 1. No cholelithiasis.  No biliary ductal dilatation. 2. Gallbladder polyps, largest 6 mm, compatible with benign cholesterol polyps. No follow-up recommended. This recommendation follows ACR consensus guidelines: White Paper of the ACR Incidental Findings Committee II on Gallbladder and Biliary Findings. J Am Coll Radiol 2013:;10:953-956. 3. Normal liver. Electronically Signed   By: Ilona Sorrel M.D.   On: 03/11/2019 16:37    ASSESSMENT & PLAN:  Lymphadenopathy, mesenteric 1.  Mesenteric and retroperitoneal lymphadenopathy: - Patient had 56 pound weight loss in the last 1 year and 6 months.  Denied any fevers or night sweats.  Reports decreased appetite. -He also reported having right upper quadrant pain for the past 4 months, constant, 7 out of 10, described mostly as achy, worse on eating, better lying on left side. 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 -1 pack/day smoker for past 17 years. -EGD on 11/26/2018 showed mild erosive reflux esophagitis.  Gastric erythema.  Normal duodenal bulb and second part of duodenum.  Biopsy consistent with gastric mucosa with slight chronic inflammation, negative for H. pylori. -Ultrasound abdomen showed no cholelithiasis.  Gallbladder polyps, largest 6 mm compatible with benign cholesterol polyps.  Normal liver.  Gallbladder scan showed normal ejection fraction. - CTAP on 03/30/2019 showed  extensive mesenteric adenopathy, largest lymph node in short axis measuring 2.7 cm.  Enlarged retroperitoneal lymph nodes are also identified.  No pelvic or inguinal adenopathy.  There is a small left kidney cyst. - These findings are highly suspicious for a lymphoma. -I have recommended PET CT  scan to identify the area of biopsy.  We will also check LDH level, beta-2 microglobulin, and hepatitis panel. -I will see him back after the PET scan and labs.  Orders Placed This Encounter  Procedures  . NM PET Image Initial (PI) Skull Base To Thigh    Standing Status:   Future    Standing Expiration Date:   04/01/2020    Order Specific Question:   ** REASON FOR EXAM (FREE TEXT)    Answer:   extensive abdominal mesenteric and retroperitoneal adenopathy    Order Specific Question:   If indicated for the ordered procedure, I authorize the administration of a radiopharmaceutical per Radiology protocol    Answer:   Yes    Order Specific Question:   Preferred imaging location?    Answer:   [facility]    Order Specific Question:   Radiology Contrast Protocol - do NOT remove file path    Answer:   [email or web address]  . Lactate dehydrogenase    Standing Status:   Future    Standing Expiration Date:   04/01/2020  . Beta 2 microglobulin, serum    Standing Status:   Future    Standing Expiration Date:   04/01/2020  . Hepatitis panel, acute    Standing Status:   Future    Standing Expiration Date:   04/01/2020    All questions were answered. The patient knows to call the clinic with any problems, questions or concerns.      Derek Jack, MD 04/02/2019 5:06 PM

## 2019-04-02 NOTE — Patient Instructions (Signed)
[place] at [facility] Discharge Instructions  You were seen today by Dr. Delton Coombes. He went over your history, family history and you've been feeling lately.  He will see you back in for labs and follow up.   Thank you for choosing [place] at [facility] to provide your oncology and hematology care.  To afford each patient quality time with our provider, please arrive at least 15 minutes before your scheduled appointment time.   If you have a lab appointment with the [facility] please come in thru the  Main Entrance and check in at the main information desk  You need to re-schedule your appointment should you arrive 10 or more minutes late.  We strive to give you quality time with our providers, and arriving late affects you and other patients whose appointments are after yours.  Also, if you no show three or more times for appointments you may be dismissed from the clinic at the providers discretion.     Again, thank you for choosing [facility].  Our hope is that these requests will decrease the amount of time that you wait before being seen by our physicians.       _____________________________________________________________  Should you have questions after your visit to [facility], please contact our office at [phone] between the hours of 8:00 a.m. and 4:30 p.m.  Voicemails left after 4:00 p.m. will not be returned until the following business day.  For prescription refill requests, have your pharmacy contact our office and allow 72 hours.    Cancer Center Support Programs:   > Cancer Support Group  2nd Tuesday of the month 1pm-2pm, Journey Room

## 2019-04-02 NOTE — Assessment & Plan Note (Addendum)
1.  Mesenteric and retroperitoneal lymphadenopathy: - Patient had 56 pound weight loss in the last 1 year and 6 months.  Denied any fevers or night sweats.  Reports decreased appetite. -He also reported having right upper quadrant pain for the past 4 months, constant, 7 out of 10, described mostly as achy, worse on eating, better lying on left side. 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 -1 pack/day smoker for past 17 years. -EGD on 11/26/2018 showed mild erosive reflux esophagitis.  Gastric erythema.  Normal duodenal bulb and second part of duodenum.  Biopsy consistent with gastric mucosa with slight chronic inflammation, negative for H. pylori. -Ultrasound abdomen showed no cholelithiasis.  Gallbladder polyps, largest 6 mm compatible with benign cholesterol polyps.  Normal liver.  Gallbladder scan showed normal ejection fraction. - CTAP on 03/30/2019 showed extensive mesenteric adenopathy, largest lymph node in short axis measuring 2.7 cm.  Enlarged retroperitoneal lymph nodes are also identified.  No pelvic or inguinal adenopathy.  There is a small left kidney cyst. - These findings are highly suspicious for a lymphoma. -I have recommended PET CT scan to identify the area of biopsy.  We will also check LDH level, beta-2 microglobulin, and hepatitis panel. -I will see him back after the PET scan and labs.

## 2019-04-08 ENCOUNTER — Other Ambulatory Visit (HOSPITAL_COMMUNITY): Payer: Self-pay | Admitting: Nurse Practitioner

## 2019-04-13 ENCOUNTER — Inpatient Hospital Stay (HOSPITAL_COMMUNITY): Payer: 59

## 2019-04-13 ENCOUNTER — Ambulatory Visit (HOSPITAL_COMMUNITY)
Admission: RE | Admit: 2019-04-13 | Discharge: 2019-04-13 | Disposition: A | Discharge status: Home / Self Care | Payer: 59 | Source: Ambulatory Visit | Attending: Nurse Practitioner | Admitting: Nurse Practitioner

## 2019-04-13 ENCOUNTER — Other Ambulatory Visit: Payer: Self-pay

## 2019-04-13 DIAGNOSIS — R590 Localized enlarged lymph nodes: Secondary | ICD-10-CM | POA: Insufficient documentation

## 2019-04-13 LAB — LACTATE DEHYDROGENASE: LDH: 126 U/L (ref 98–192)

## 2019-04-13 LAB — GLUCOSE, CAPILLARY: Glucose-Capillary: 87 mg/dL (ref 70–99)

## 2019-04-13 MED ORDER — FLUDEOXYGLUCOSE F - 18 (FDG) INJECTION
9.4200 | Freq: Once | INTRAVENOUS | Status: AC
  Administered 2019-04-13: 11:00:00 9.42 via INTRAVENOUS

## 2019-04-14 ENCOUNTER — Inpatient Hospital Stay (HOSPITAL_BASED_OUTPATIENT_CLINIC_OR_DEPARTMENT_OTHER): Payer: 59 | Admitting: Hematology

## 2019-04-14 ENCOUNTER — Encounter (HOSPITAL_COMMUNITY): Payer: Self-pay | Admitting: Hematology

## 2019-04-14 DIAGNOSIS — R590 Localized enlarged lymph nodes: Secondary | ICD-10-CM | POA: Diagnosis not present

## 2019-04-14 LAB — BETA 2 MICROGLOBULIN, SERUM: Beta-2 Microglobulin: 2.3 mg/L (ref 0.6–2.4)

## 2019-04-14 LAB — HEPATITIS PANEL, ACUTE
HCV Ab: <0.1 s/co ratio (ref 0.0–0.9)
Hep A IgM: NEGATIVE
Hep B C IgM: NEGATIVE
Hepatitis B Surface Ag: NEGATIVE

## 2019-04-14 NOTE — Assessment & Plan Note (Signed)
1.  Mesenteric and retroperitoneal adenopathy: -He had 56 pound weight loss in the last 1 and half year.  Denies any fevers or night sweats.  Decrease in appetite. -Right upper quadrant pain in the past 4 months, constant, 7 out of 10, described mostly as achy, worse on eating, better on lying on left side. 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 -1 pack/day smoker for the past 17 years. -EGD on 11/26/2018 shows mild erosive reflux esophagitis.  Gastric erythema.  Normal duodenal bulb and second part of duodenum.  Biopsy consistent with gastric mucosa with slight chronic inflammation, negative for H. pylori. -Ultrasound of the abdomen showed no cholelithiasis.  Gallbladder polyps, largest 6 mm compatible with benign cholesterol polyps.  Normal liver.  Gallbladder scan showed normal ejection fraction. -CTAP on 03/30/2019 showed extensive mesenteric adenopathy, largest lymph node in short axis measuring 2.7 cm.  Enlarged retroperitoneal lymph nodes. -LDH was normal.  Hepatitis panel was normal. - PET scan on 04/13/2019 showed enlarged mesenteric and retroperitoneal lymph node mostly with SUV of 3.0.  Left axillary lymph node with SUV 2.1. -I have recommended biopsy of the left axillary lymph node.  If it is not feasible, laparoscopic biopsy of the mesenteric lymph nodes can be considered.  We will make a referral to Dr. Renold Genta. -I will see him back after the biopsy to discuss further plan.

## 2019-04-14 NOTE — Progress Notes (Signed)
[place], [place] [address]   CLINIC:  Medical Oncology/Hematology  PCP:  Orpah Greek, MD [address], [address] [place] [address] [phone]   REASON FOR VISIT:  Follow-up for mesenteric and retroperitoneal adenopathy.  CURRENT THERAPY: Work-up in progress.    INTERVAL HISTORY:  Patrick Rubio 58 y.o. male seen for follow-up of mesenteric and retroperitoneal adenopathy.  He continues to have right upper quadrant pain, worse on eating.  He lost another couple of pounds.  In total he lost 56 pounds in the last 1 and half year.  He continues to smoke 1 pack of cigarettes per day.  Occasional nausea associated with pain which occasionally leads to vomiting mostly in the mornings.  Vomitus contains food consumed the previous night.  Denies any fevers or night sweats.    REVIEW OF SYSTEMS:  Review of Systems  Gastrointestinal: Positive for abdominal pain and vomiting.  All other systems reviewed and are negative.    PAST MEDICAL/SURGICAL HISTORY:  Past Medical History:  Diagnosis Date  . CAD (coronary artery disease)   . GERD (gastroesophageal reflux disease)   . HTN (hypertension)   . Hypercholesterolemia   . MI (myocardial infarction) [facility])    Past Surgical History:  Procedure Laterality Date  . BIOPSY  11/26/2018   Procedure: BIOPSY;  Surgeon: Daneil Dolin, MD;  Location: [facility];  Service: Endoscopy;;  . CORONARY ANGIOPLASTY WITH STENT PLACEMENT     2003  . ESOPHAGOGASTRODUODENOSCOPY N/A 11/26/2018   mild erosive reflux esophagitis, gastric erythema. Negative H.pylori   . KNEE SURGERY    . lumbar fusion with cage  07/2017  . NASAL SINUS SURGERY    . TONSILLECTOMY AND ADENOIDECTOMY       SOCIAL HISTORY:  Social History   Socioeconomic History  . Marital status: Divorced    Spouse name: Not on file  . Number of children: 2  . Years of education: Not on file  . Highest education level: Not on file   Occupational History  . Occupation: employed    Comment: part time at [employer]  . Financial resource strain: Not hard at all  . Food insecurity    Worry: Never true    Inability: Never true  . Transportation needs    Medical: No    Non-medical: No  Tobacco Use  . Smoking status: Current Every Day Smoker    Packs/day: 0.50    Types: Cigarettes  . Smokeless tobacco: Never Used  Substance and Sexual Activity  . Alcohol use: Yes    Comment: occ  . Drug use: Never  . Sexual activity: Yes  Lifestyle  . Physical activity    Days per week: 4 days    Minutes per session: 60 min  . Stress: Not at all  Relationships  . Social connections    Talks on phone: More than three times a week    Gets together: More than three times a week    Attends religious service: More than 4 times per year    Active member of club or organization: Yes    Attends meetings of clubs or organizations: 1 to 4 times per year    Relationship status: Divorced  . Intimate partner violence    Fear of current or ex partner: No    Emotionally abused: No    Physically abused: No    Forced sexual activity: No  Other Topics Concern  .  Not on file  Social History Narrative  . Not on file    FAMILY HISTORY:  Family History  Problem Relation Age of Onset  . Colon polyps Father        thinks he may have had polyps, possibly in his 67s.   [name] Heart attack Father   . Dementia Mother   . Hypotension Mother   . Stroke Sister   . Diabetes Maternal Grandmother   . Stroke Maternal Grandfather   . Cancer Paternal Grandmother   . Congestive Heart Failure Paternal Grandfather   . Kidney cancer Daughter   . Colon cancer Neg Hx   . Pancreatic cancer Neg Hx   . Liver disease Neg Hx     CURRENT MEDICATIONS:  Outpatient Encounter Medications as of 04/14/2019  Medication Sig  . aspirin EC 81 MG tablet Take 81 mg by mouth 3 (three) times a week. Takes Mon, Wed, Fri  . baclofen (LIORESAL) 10 MG  tablet Take 10 mg by mouth 2 (two) times daily as needed (spasms).   . cimetidine (TAGAMET) 200 MG tablet Take 200 mg by mouth 2 (two) times daily.  . clopidogrel (PLAVIX) 75 MG tablet Take 75 mg by mouth daily.   [name] escitalopram (LEXAPRO) 20 MG tablet Take 20 mg by mouth daily.   [name] gabapentin (NEURONTIN) 100 MG capsule Take 100 mg by mouth as needed.   [name] lisinopril (PRINIVIL,ZESTRIL) 10 MG tablet Take 10 mg by mouth daily.   . naproxen sodium (ALEVE) 220 MG tablet Take 220-440 mg by mouth 2 (two) times daily as needed (pain.).  [name] pantoprazole (PROTONIX) 40 MG tablet Take 40 mg by mouth 2 (two) times daily.  . simvastatin (ZOCOR) 40 MG tablet Take 40 mg by mouth every other day. At bedtime.  [name] SEMAGLUTIDE, 1 MG/DOSE, [place] Inject 3 mg into the skin every Thursday. Receives this medication from [facility] as part of a research trial (3mg  dose is not commercially available)   No facility-administered encounter medications on file as of 04/14/2019.     ALLERGIES:  No Known Allergies   PHYSICAL EXAM:  ECOG Performance status: 1  Vitals:   04/14/19 1058  BP: 132/87  Pulse: 62  Resp: 18  Temp: (!) 97.1 F (36.2 C)  SpO2: 99%   Filed Weights   04/14/19 1058  Weight: 180 lb 6.4 oz (81.8 kg)    Physical Exam Vitals signs reviewed.  Constitutional:      Appearance: Normal appearance.  Cardiovascular:     Rate and Rhythm: Normal rate and regular rhythm.     Heart sounds: Normal heart sounds.  Pulmonary:     Effort: Pulmonary effort is normal.     Breath sounds: Normal breath sounds.  Abdominal:     General: There is no distension.     Palpations: Abdomen is soft. There is no mass.  Musculoskeletal:        General: No swelling.  Skin:    General: Skin is warm.  Neurological:     General: No focal deficit present.     Mental Status: He is alert and oriented to person, place, and time.  Psychiatric:        Mood and Affect: Mood normal.        Behavior:  Behavior normal.      LABORATORY DATA:  I have reviewed the labs as listed.  CBC    Component Value Date/Time   WBC 8.7 03/12/2019 0847   RBC 4.60 03/12/2019  0847   HGB 13.2 03/12/2019 0847   HCT 40.7 03/12/2019 0847   PLT 189 03/12/2019 0847   MCV 88.5 03/12/2019 0847   MCH 28.7 03/12/2019 0847   MCHC 32.4 03/12/2019 0847   RDW 13.5 03/12/2019 0847   LYMPHSABS 2.7 03/12/2019 0847   MONOABS 0.5 03/12/2019 0847   EOSABS 0.4 03/12/2019 0847   BASOSABS 0.1 03/12/2019 0847   CMP Latest Ref Rng & Units 03/12/2019  Glucose 70 - 99 mg/dL 75  BUN 6 - 20 mg/dL 17  Creatinine 0.61 - 1.24 mg/dL 0.80  Sodium 135 - 145 mmol/L 136  Potassium 3.5 - 5.1 mmol/L 3.8  Chloride 98 - 111 mmol/L 105  CO2 22 - 32 mmol/L 24  Calcium 8.9 - 10.3 mg/dL 8.6(L)  Total Protein 6.5 - 8.1 g/dL 6.4(L)  Total Bilirubin 0.3 - 1.2 mg/dL 0.9  Alkaline Phos 38 - 126 U/L 52  AST 15 - 41 U/L 15  ALT 0 - 44 U/L 11       DIAGNOSTIC IMAGING:  I have independently reviewed the scans and discussed with the patient.    ASSESSMENT & PLAN:   Lymphadenopathy, mesenteric 1.  Mesenteric and retroperitoneal adenopathy: -He had 56 pound weight loss in the last 1 and half year.  Denies any fevers or night sweats.  Decrease in appetite. -Right upper quadrant pain in the past 4 months, constant, 7 out of 10, described mostly as achy, worse on eating, better on lying on left side. 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 -1 pack/day smoker for the past 17 years. -EGD on 11/26/2018 shows mild erosive reflux esophagitis.  Gastric erythema.  Normal duodenal bulb and second part of duodenum.  Biopsy consistent with gastric mucosa with slight chronic inflammation, negative for H. pylori. -Ultrasound of the abdomen showed no cholelithiasis.  Gallbladder polyps, largest 6 mm compatible with benign cholesterol polyps.  Normal liver.  Gallbladder scan showed normal ejection  fraction. -CTAP on 03/30/2019 showed extensive mesenteric adenopathy, largest lymph node in short axis measuring 2.7 cm.  Enlarged retroperitoneal lymph nodes. -LDH was normal.  Hepatitis panel was normal. - PET scan on 04/13/2019 showed enlarged mesenteric and retroperitoneal lymph node mostly with SUV of 3.0.  Left axillary lymph node with SUV 2.1. -I have recommended biopsy of the left axillary lymph node.  If it is not feasible, laparoscopic biopsy of the mesenteric lymph nodes can be considered.  We will make a referral to Dr. Renold Genta. -I will see him back after the biopsy to discuss further plan.   Total time spent is 25 minutes with more than 50% of the time spent face-to-face discussing scan results, further work-up, counseling and coordination of care.  Orders placed this encounter:  No orders of the defined types were placed in this encounter.     Derek Jack, MD [place] [phone]

## 2019-04-14 NOTE — Patient Instructions (Addendum)
[place] at [facility] Discharge Instructions  You were seen today by Dr. Delton Coombes. He went over your recent lab and scan results. 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 He will schedule you with a surgeon to have this biopsy done. Once we have those results back we will be able to come up with a plan for treatment. He will see you back after the biopsy for follow up.   Thank you for choosing [place] at [facility] to provide your oncology and hematology care.  To afford each patient quality time with our provider, please arrive at least 15 minutes before your scheduled appointment time.   If you have a lab appointment with the [place] [facility] please come in thru the  Main Entrance and check in at the main information desk  You need to re-schedule your appointment should you arrive 10 or more minutes late.  We strive to give you quality time with our providers, and arriving late affects you and other patients whose appointments are after yours.  Also, if you no show three or more times for appointments you may be dismissed from the clinic at the providers discretion.     Again, thank you for choosing [facility].  Our hope is that these requests will decrease the amount of time that you wait before being seen by our physicians.       _____________________________________________________________  Should you have questions after your visit to [facility], please contact our office at [phone] between the hours of 8:00 a.m. and 4:30 p.m.  Voicemails left after 4:00 p.m. will not be returned until the following business day.  For prescription refill requests, have your pharmacy contact our office and allow 72 hours.    Cancer Center Support Programs:   > Cancer Support Group  2nd Tuesday of the month 1pm-2pm, Journey Room

## 2019-04-21 ENCOUNTER — Encounter: Payer: Self-pay | Admitting: General Surgery

## 2019-04-21 ENCOUNTER — Ambulatory Visit (INDEPENDENT_AMBULATORY_CARE_PROVIDER_SITE_OTHER): Payer: 59 | Admitting: General Surgery

## 2019-04-21 ENCOUNTER — Other Ambulatory Visit: Payer: Self-pay

## 2019-04-21 VITALS — BP 113/74 | HR 63 | Temp 97.5°F | Resp 16 | Ht 71.0 in | Wt 184.0 lb

## 2019-04-21 DIAGNOSIS — R591 Generalized enlarged lymph nodes: Secondary | ICD-10-CM

## 2019-04-21 DIAGNOSIS — K4090 Unilateral inguinal hernia, without obstruction or gangrene, not specified as recurrent: Secondary | ICD-10-CM | POA: Diagnosis not present

## 2019-04-21 NOTE — Patient Instructions (Signed)
Inguinal Hernia, Adult An inguinal hernia develops when fat or the intestines push through a weak spot in a muscle where your leg meets your lower abdomen (groin). This creates a bulge. This kind of hernia could also be:  In your scrotum, if you are male.  In folds of skin around your vagina, if you are male. There are three types of inguinal hernias:  Hernias that can be pushed back into the abdomen (are reducible). This type rarely causes pain.  Hernias that are not reducible (are incarcerated).  Hernias that are not reducible and lose their blood supply (are strangulated). This type of hernia requires emergency surgery. What are the causes? This condition is caused by having a weak spot in the muscles or tissues in the groin. This weak spot develops over time. The hernia may poke through the weak spot when you suddenly strain your lower abdominal muscles, such as when you:  Lift a heavy object.  Strain to have a bowel movement. Constipation can lead to straining.  Cough. What increases the risk? This condition is more likely to develop in:  Men.  Pregnant women.  People who: ? Are overweight. ? Work in jobs that require long periods of standing or heavy lifting. ? Have had an inguinal hernia before. ? Smoke or have lung disease. These factors can lead to long-lasting (chronic) coughing. What are the signs or symptoms? Symptoms may depend on the size of the hernia. Often, a small inguinal hernia has no symptoms. Symptoms of a larger hernia may include:  A lump in the groin area. This is easier to see when standing. It might not be visible when lying down.  Pain or burning in the groin. This may get worse when lifting, straining, or coughing.  A dull ache or a feeling of pressure in the groin.  In men, an unusual lump in the scrotum. Symptoms of a strangulated inguinal hernia may include:  A bulge in your groin that is very painful and tender to the touch.  A bulge  that turns red or purple.  Fever, nausea, and vomiting.  Inability to have a bowel movement or to pass gas. How is this diagnosed? This condition is diagnosed based on your symptoms, your medical history, and a physical exam. Your health care provider may feel your groin area and ask you to cough. How is this treated? Treatment depends on the size of your hernia and whether you have symptoms. If you do not have symptoms, your health care provider may have you watch your hernia carefully and have you come in for follow-up visits. If your hernia is large or if you have symptoms, you may need surgery to repair the hernia. Follow these instructions at home: Lifestyle  Avoid lifting heavy objects.  Avoid standing for long periods of time.  Do not use any products that contain nicotine or tobacco, such as cigarettes and e-cigarettes. If you need help quitting, ask your health care provider.  Maintain a healthy weight. Preventing constipation  Take actions to prevent constipation. Constipation leads to straining with bowel movements, which can make a hernia worse or cause a hernia repair to break down. Your health care provider may recommend that you: ? Drink enough fluid to keep your urine pale yellow. ? Eat foods that are high in fiber, such as fresh fruits and vegetables, whole grains, and beans. ? Limit foods that are high in fat and processed sugars, such as fried or sweet foods. ? Take an over-the-counter   or prescription medicine for constipation. General instructions  You may try to push the hernia back in place by very gently pressing on it while lying down. Do not try to force the bulge back in if it will not push in easily.  Watch your hernia for any changes in shape, size, or color. Get help right away if you notice any changes.  Take over-the-counter and prescription medicines only as told by your health care provider.  Keep all follow-up visits as told by your health care  provider. This is important. Contact a health care provider if:  You have a fever.  You develop new symptoms.  Your symptoms get worse. Get help right away if:  You have pain in your groin that suddenly gets worse.  You have a bulge in your groin that: ? Suddenly gets bigger and does not get smaller. ? Becomes red or purple or painful to the touch.  You are a man and you have a sudden pain in your scrotum, or the size of your scrotum suddenly changes.  You cannot push the hernia back in place by very gently pressing on it when you are lying down. Do not try to force the bulge back in if it will not push in easily.  You have nausea or vomiting that does not go away.  You have a fast heartbeat.  You cannot have a bowel movement or pass gas. These symptoms may represent a serious problem that is an emergency. Do not wait to see if the symptoms will go away. Get medical help right away. Call your local emergency services (911 in the [place] Summary  An inguinal hernia develops when fat or the intestines push through a weak spot in a muscle where your leg meets your lower abdomen (groin).  This condition is caused by having a weak spot in muscles or tissue in your groin.  Symptoms may depend on the size of the hernia, and they may include pain or swelling in your groin. A small inguinal hernia often has no symptoms.  Treatment may not be needed if you do not have symptoms. If you have symptoms or a large hernia, you may need surgery to repair the hernia.  Avoid lifting heavy objects. Also avoid standing for long amounts of time. This information is not intended to replace advice given to you by your health care provider. Make sure you discuss any questions you have with your health care provider. Document Released: 12/02/2008 Document Revised: 08/17/2017 Document Reviewed: 04/17/2017 Elsevier Patient Education  [place]. Open Lymph Node Biopsy An open lymph node biopsy is  a procedure to remove a lymph node so that it can be examined under a microscope. Lymph nodes are part of the body's disease-fighting system (immune system). The immune system protects the body from infections, germs, and diseases. An open lymph node biopsy may be done to:  Look for germs or cancer cells in your lymph node.  Find out why your lymph node is swollen.  Find out more about a condition you have. Lymph nodes are found in many locations in the body. Biopsies are often done on lymph nodes in the head, neck, armpit, or groin. Tell a health care provider about:  Any allergies you have.  All medicines you are taking, including vitamins, herbs, eye drops, creams, and over-the-counter medicines.  Any problems you or family members have had with anesthetic medicines.  Any blood disorders you have.  Any surgeries you have had.  Any medical conditions you have or have had.  Whether you are pregnant or may be pregnant. What are the risks? Generally, this is a safe procedure. However, problems may occur, including:  Infection.  Bleeding.  Allergic reactions to medicines.  Damage to other structures or organs, such as a nerve.  Scarring. What happens before the procedure? Medicines Ask your health care provider about:  Changing or stopping your regular medicines. This is especially important if you are taking diabetes medicines or blood thinners.  Taking medicines such as aspirin and ibuprofen. These medicines can thin your blood. Do not take these medicines unless your health care provider tells you to take them.  Taking over-the-counter medicines, vitamins, herbs, and supplements. General instructions  Follow instructions from your health care provider about eating or drinking restrictions.  You may have an exam or testing.  You may have a blood or urine sample taken.  Plan to have someone take you home from the hospital or clinic.  If you will be going home  right after the procedure, plan to have someone with you for 24 hours.  Ask your health care provider how your surgical site will be marked or identified.  Ask your health care provider what steps will be taken to help prevent infection. These may include: ? Removing hair at the biopsy site. ? Washing skin with a germ-killing soap. ? Taking antibiotic medicine. What happens during the procedure?   An IV will be inserted into one of your veins.  You will be given one or more of the following: ? A medicine to help you relax (sedative). ? A medicine to numb the area (local anesthetic).  An incision will be made in the area where your lymph node is located.  Your lymph node will be removed.  Your incision will be closed with stitches (sutures).  An antibiotic ointment may be applied to your incision.  A bandage (dressing) will be placed over your incision. The procedure may vary among health care providers and hospitals. What happens after the procedure?  Your blood pressure, heart rate, breathing rate, and blood oxygen level will be monitored until you leave the hospital or clinic.  Do not drive for 24 hours if you were given a sedative during your procedure.  It is up to you to get the results of your procedure. Ask your health care provider, or the department that is doing the procedure, when your results will be ready. Summary  An open lymph node biopsy is a procedure to remove a lymph node so that it can be checked for infections, germs, and disease.  Generally, this is a safe procedure. However, problems may occur, including bleeding, infection, allergic reaction to medicines, and damage to other structures or organs.  Follow your health care provider's instructions before the procedure. These may include changing or stopping some medicines and restricting what you eat and drink.  During the procedure, an incision will be made in the area of the lymph node, the lymph node  will be removed, and the incision will be closed with sutures.  You will be monitored after the procedure. Do not drive for 24 hours if you were given a sedative during your procedure. This information is not intended to replace advice given to you by your health care provider. Make sure you discuss any questions you have with your health care provider. Document Released: 08/12/2015 Document Revised: 02/20/2018 Document Reviewed: 02/20/2018 Elsevier Patient Education  2020 [employer].

## 2019-04-22 NOTE — H&P (Signed)
Patrick Rubio; [id number]; Jun 25, 1961   HPI Patient is a 58 year old white male who was referred to my care by Drs. Sabra Heck and [place] for a lymph node biopsy.  Patient has had a 50 pound weight loss over many months now.  A recent CT scan and PET scan of the abdomen and chest revealed significant mesenteric and retroperitoneal lymphadenopathy as well as a pathologic lymph node in the left axilla.  He also has had ongoing right upper quadrant abdominal pain with cholelithiasis.  I have been asked to do a lymph node biopsy of the left axilla.  Patient also complains of pain in a right inguinal hernia that has developed over the past few months.  Is made worse with straining.  It constantly sticks out.  The pain radiates to his right testicle.  He currently has a pain level of 6 out of 10 from it.  He denies any fever or chills. Past Medical History:  Diagnosis Date  . CAD (coronary artery disease)   . GERD (gastroesophageal reflux disease)   . HTN (hypertension)   . Hypercholesterolemia   . MI (myocardial infarction) [facility])     Past Surgical History:  Procedure Laterality Date  . BIOPSY  11/26/2018   Procedure: BIOPSY;  Surgeon: Daneil Dolin, MD;  Location: [facility];  Service: Endoscopy;;  . CORONARY ANGIOPLASTY WITH STENT PLACEMENT     2003  . ESOPHAGOGASTRODUODENOSCOPY N/A 11/26/2018   mild erosive reflux esophagitis, gastric erythema. Negative H.pylori   . KNEE SURGERY    . lumbar fusion with cage  07/2017  . NASAL SINUS SURGERY    . TONSILLECTOMY AND ADENOIDECTOMY      Family History  Problem Relation Age of Onset  . Colon polyps Father        thinks he may have had polyps, possibly in his 41s.   [name] Heart attack Father   . Dementia Mother   . Hypotension Mother   . Stroke Sister   . Diabetes Maternal Grandmother   . Stroke Maternal Grandfather   . Cancer Paternal Grandmother   . Congestive Heart Failure Paternal Grandfather   . Kidney cancer Daughter   . Colon cancer  Neg Hx   . Pancreatic cancer Neg Hx   . Liver disease Neg Hx     Current Outpatient Medications on File Prior to Visit  Medication Sig Dispense Refill  . aspirin EC 81 MG tablet Take 81 mg by mouth every Monday, Wednesday, and Friday.     . baclofen (LIORESAL) 10 MG tablet Take 10 mg by mouth 2 (two) times daily as needed for muscle spasms.     . cimetidine (TAGAMET) 200 MG tablet Take 200 mg by mouth 2 (two) times daily.    . clopidogrel (PLAVIX) 75 MG tablet Take 75 mg by mouth daily.     [name] escitalopram (LEXAPRO) 20 MG tablet Take 20 mg by mouth daily.     [name] gabapentin (NEURONTIN) 100 MG capsule Take 100 mg by mouth 2 (two) times daily as needed (pain).     [name] lisinopril (PRINIVIL,ZESTRIL) 10 MG tablet Take 10 mg by mouth daily.     . naproxen sodium (ALEVE) 220 MG tablet Take 220-440 mg by mouth 2 (two) times daily as needed (pain).     . pantoprazole (PROTONIX) 40 MG tablet Take 40 mg by mouth 2 (two) times daily.    [name] SEMAGLUTIDE, 1 MG/DOSE, [place] Inject 3 mg into the skin every Thursday. Receives this medication from  [facility] with Dr. Orpah Greek as part of a research trial (3mg  dose is not commercially available)    . simvastatin (ZOCOR) 40 MG tablet Take 40 mg by mouth every other day.      No current facility-administered medications on file prior to visit.     No Known Allergies  Social History   Substance and Sexual Activity  Alcohol Use Yes   Comment: occ    Social History   Tobacco Use  Smoking Status Current Every Day Smoker  . Packs/day: 0.50  . Types: Cigarettes  Smokeless Tobacco Never Used    Review of Systems  Constitutional: Negative.   HENT: Positive for sinus pain.   Eyes: Negative.   Respiratory: Negative.   Cardiovascular: Negative.   Gastrointestinal: Positive for abdominal pain, heartburn and nausea.  Genitourinary: Negative.   Musculoskeletal: Positive for back pain and joint pain.  Skin: Negative.   Neurological:  Negative.   Endo/Heme/Allergies: Negative.   Psychiatric/Behavioral: Negative.     Objective   Vitals:   04/21/19 1321  BP: 113/74  Pulse: 63  Resp: 16  Temp: (!) 97.5 F (36.4 C)  SpO2: 97%    Physical Exam Vitals signs reviewed.  Constitutional:      Appearance: Normal appearance. He is not ill-appearing.  HENT:     Head: Normocephalic and atraumatic.  Cardiovascular:     Rate and Rhythm: Normal rate and regular rhythm.     Heart sounds: Normal heart sounds. No murmur. No friction rub. No gallop.   Pulmonary:     Effort: Pulmonary effort is normal. No respiratory distress.     Breath sounds: Normal breath sounds. No stridor. No wheezing, rhonchi or rales.  Abdominal:     General: Bowel sounds are normal. There is no distension.     Palpations: Abdomen is soft. There is no mass.     Tenderness: There is no abdominal tenderness. There is no guarding or rebound.     Hernia: A hernia is present.     Comments: Reducible right inguinal hernia.  Genitourinary:    Comments: Genitourinary examination is within normal limits. Skin:    General: Skin is warm and dry.  Neurological:     Mental Status: He is alert and oriented to person, place, and time.   Left axilla with a faintly palpable lymph node deep in the axilla. Dr. Tomie China notes reviewed.  CT/PET scan images personally reviewed Assessment  Generalized lymphadenopathy of unknown etiology, right inguinal hernia Plan   Patient is scheduled for a left axillary lymph node biopsy, right inguinal herniorrhaphy on 04/28/2019.  The risks and benefits of the procedures including bleeding, infection, mesh use, and the possibility of a negative lymph node biopsy were fully explained to the patient, who gave informed consent.  Should the left axillary lymph node biopsy be negative, I told the patient that we would proceed with exploratory laparotomy, mesenteric lymph node biopsy, and cholecystectomy.

## 2019-04-22 NOTE — Progress Notes (Signed)
Patrick Rubio; [id number]; 12/24/1960   HPI Patient is a 58 year old white male who was referred to my care by Drs. Sabra Heck and [place] for a lymph node biopsy.  Patient has had a 50 pound weight loss over many months now.  A recent CT scan and PET scan of the abdomen and chest revealed significant mesenteric and retroperitoneal lymphadenopathy as well as a pathologic lymph node in the left axilla.  He also has had ongoing right upper quadrant abdominal pain with cholelithiasis.  I have been asked to do a lymph node biopsy of the left axilla.  Patient also complains of pain in a right inguinal hernia that has developed over the past few months.  Is made worse with straining.  It constantly sticks out.  The pain radiates to his right testicle.  He currently has a pain level of 6 out of 10 from it.  He denies any fever or chills. Past Medical History:  Diagnosis Date  . CAD (coronary artery disease)   . GERD (gastroesophageal reflux disease)   . HTN (hypertension)   . Hypercholesterolemia   . MI (myocardial infarction) [facility])     Past Surgical History:  Procedure Laterality Date  . BIOPSY  11/26/2018   Procedure: BIOPSY;  Surgeon: Daneil Dolin, MD;  Location: [facility];  Service: Endoscopy;;  . CORONARY ANGIOPLASTY WITH STENT PLACEMENT     2003  . ESOPHAGOGASTRODUODENOSCOPY N/A 11/26/2018   mild erosive reflux esophagitis, gastric erythema. Negative H.pylori   . KNEE SURGERY    . lumbar fusion with cage  07/2017  . NASAL SINUS SURGERY    . TONSILLECTOMY AND ADENOIDECTOMY      Family History  Problem Relation Age of Onset  . Colon polyps Father        thinks he may have had polyps, possibly in his 53s.   [name] Heart attack Father   . Dementia Mother   . Hypotension Mother   . Stroke Sister   . Diabetes Maternal Grandmother   . Stroke Maternal Grandfather   . Cancer Paternal Grandmother   . Congestive Heart Failure Paternal Grandfather   . Kidney cancer Daughter   . Colon cancer  Neg Hx   . Pancreatic cancer Neg Hx   . Liver disease Neg Hx     Current Outpatient Medications on File Prior to Visit  Medication Sig Dispense Refill  . aspirin EC 81 MG tablet Take 81 mg by mouth every Monday, Wednesday, and Friday.     . baclofen (LIORESAL) 10 MG tablet Take 10 mg by mouth 2 (two) times daily as needed for muscle spasms.     . cimetidine (TAGAMET) 200 MG tablet Take 200 mg by mouth 2 (two) times daily.    . clopidogrel (PLAVIX) 75 MG tablet Take 75 mg by mouth daily.     [name] escitalopram (LEXAPRO) 20 MG tablet Take 20 mg by mouth daily.     [name] gabapentin (NEURONTIN) 100 MG capsule Take 100 mg by mouth 2 (two) times daily as needed (pain).     [name] lisinopril (PRINIVIL,ZESTRIL) 10 MG tablet Take 10 mg by mouth daily.     . naproxen sodium (ALEVE) 220 MG tablet Take 220-440 mg by mouth 2 (two) times daily as needed (pain).     . pantoprazole (PROTONIX) 40 MG tablet Take 40 mg by mouth 2 (two) times daily.    [name] SEMAGLUTIDE, 1 MG/DOSE, [place] Inject 3 mg into the skin every Thursday. Receives this medication from  [facility] with Dr. Orpah Greek as part of a research trial (3mg  dose is not commercially available)    . simvastatin (ZOCOR) 40 MG tablet Take 40 mg by mouth every other day.      No current facility-administered medications on file prior to visit.     No Known Allergies  Social History   Substance and Sexual Activity  Alcohol Use Yes   Comment: occ    Social History   Tobacco Use  Smoking Status Current Every Day Smoker  . Packs/day: 0.50  . Types: Cigarettes  Smokeless Tobacco Never Used    Review of Systems  Constitutional: Negative.   HENT: Positive for sinus pain.   Eyes: Negative.   Respiratory: Negative.   Cardiovascular: Negative.   Gastrointestinal: Positive for abdominal pain, heartburn and nausea.  Genitourinary: Negative.   Musculoskeletal: Positive for back pain and joint pain.  Skin: Negative.   Neurological:  Negative.   Endo/Heme/Allergies: Negative.   Psychiatric/Behavioral: Negative.     Objective   Vitals:   04/21/19 1321  BP: 113/74  Pulse: 63  Resp: 16  Temp: (!) 97.5 F (36.4 C)  SpO2: 97%    Physical Exam Vitals signs reviewed.  Constitutional:      Appearance: Normal appearance. He is not ill-appearing.  HENT:     Head: Normocephalic and atraumatic.  Cardiovascular:     Rate and Rhythm: Normal rate and regular rhythm.     Heart sounds: Normal heart sounds. No murmur. No friction rub. No gallop.   Pulmonary:     Effort: Pulmonary effort is normal. No respiratory distress.     Breath sounds: Normal breath sounds. No stridor. No wheezing, rhonchi or rales.  Abdominal:     General: Bowel sounds are normal. There is no distension.     Palpations: Abdomen is soft. There is no mass.     Tenderness: There is no abdominal tenderness. There is no guarding or rebound.     Hernia: A hernia is present.     Comments: Reducible right inguinal hernia.  Genitourinary:    Comments: Genitourinary examination is within normal limits. Skin:    General: Skin is warm and dry.  Neurological:     Mental Status: He is alert and oriented to person, place, and time.   Left axilla with a faintly palpable lymph node deep in the axilla. Dr. Tomie China notes reviewed.  CT/PET scan images personally reviewed Assessment  Generalized lymphadenopathy of unknown etiology, right inguinal hernia Plan   Patient is scheduled for a left axillary lymph node biopsy, right inguinal herniorrhaphy on 04/28/2019.  The risks and benefits of the procedures including bleeding, infection, mesh use, and the possibility of a negative lymph node biopsy were fully explained to the patient, who gave informed consent.  Should the left axillary lymph node biopsy be negative, I told the patient that we would proceed with exploratory laparotomy, mesenteric lymph node biopsy, and cholecystectomy.

## 2019-04-23 ENCOUNTER — Other Ambulatory Visit: Payer: Self-pay

## 2019-04-23 ENCOUNTER — Other Ambulatory Visit (HOSPITAL_COMMUNITY)
Admission: RE | Admit: 2019-04-23 | Discharge: 2019-04-23 | Disposition: A | Discharge status: Home / Self Care | Payer: 59 | Source: Ambulatory Visit | Attending: General Surgery | Admitting: General Surgery

## 2019-04-23 ENCOUNTER — Encounter (HOSPITAL_COMMUNITY): Payer: Self-pay

## 2019-04-23 ENCOUNTER — Encounter (HOSPITAL_COMMUNITY)
Admission: RE | Admit: 2019-04-23 | Discharge: 2019-04-23 | Disposition: A | Discharge status: Home / Self Care | Payer: 59 | Source: Ambulatory Visit | Attending: General Surgery | Admitting: General Surgery

## 2019-04-23 DIAGNOSIS — Z20828 Contact with and (suspected) exposure to other viral communicable diseases: Secondary | ICD-10-CM | POA: Insufficient documentation

## 2019-04-23 DIAGNOSIS — K4090 Unilateral inguinal hernia, without obstruction or gangrene, not specified as recurrent: Secondary | ICD-10-CM | POA: Insufficient documentation

## 2019-04-23 DIAGNOSIS — Z01818 Encounter for other preprocedural examination: Secondary | ICD-10-CM | POA: Insufficient documentation

## 2019-04-23 DIAGNOSIS — R591 Generalized enlarged lymph nodes: Secondary | ICD-10-CM | POA: Insufficient documentation

## 2019-04-23 LAB — SARS CORONAVIRUS 2 (TAT 6-24 HRS): SARS Coronavirus 2: NEGATIVE

## 2019-04-23 NOTE — Patient Instructions (Signed)
Patrick Rubio  04/23/2019     @PREFPERIOPPHARMACY @   Your procedure is scheduled on  04/27/2019 .  Report to [facility] at  [email or web address].  Call this number if you have problems the morning of surgery:  [phone]   Remember:  Do not eat or drink after midnight.                      Take these medicines the morning of surgery with A SIP OF WATER  Baclofen,tagamet, lexapro, neurontin, protonix.    Do not wear jewelry, make-up or nail polish.  Do not wear lotions, powders, or perfumes, or deodorant. Please brush your teeth  Do not shave 48 hours prior to surgery.  Men may shave face and neck.  Do not bring valuables to the hospital.  [facility] is not responsible for any belongings or valuables.  Contacts, dentures or bridgework may not be worn into surgery.  Leave your suitcase in the car.  After surgery it may be brought to your room.  For patients admitted to the hospital, discharge time will be determined by your treatment team.  Patients discharged the day of surgery will not be allowed to drive home.   Name and phone number of your driver:   family Special instructions:  Follow any instructions given to you by Dr Arnoldo Morale concerning your plavix.  Please read over the following fact sheets that you were given. Anesthesia Post-op Instructions and Care and Recovery After Surgery       Axillary Lymph Node Dissection, Care After This sheet gives you information about how to care for yourself after your procedure. Your health care provider may also give you more specific instructions. If you have problems or questions, contact your health care provider. What can I expect after the procedure? After the procedure, it is common to have:  Pain and soreness around your incision area.  Trouble moving your arm or shoulder.  A small amount of swelling in your arm.  Numbness on the upper and inside parts of your arm. Follow these instructions at home: Medicines   Take over-the-counter and prescription medicines only as told by your health care provider.  If you were prescribed an antibiotic medicine, take it as told by your health care provider. Do not stop taking the antibiotic even if you start to feel better. Incision care   Follow instructions from your health care provider about how to take care of your incision. Make sure you: ? Wash your hands with soap and water before you change your bandage (dressing). If soap and water are not available, use hand sanitizer. ? Change your dressing as told by your health care provider. ? Leave stitches (sutures), skin glue, or adhesive strips in place. These skin closures may need to stay in place for 2 weeks or longer. If adhesive strip edges start to loosen and curl up, you may trim the loose edges. Do not remove adhesive strips completely unless your health care provider tells you to do that.  Check your incision area every day for signs of infection. Check for: ? Redness, swelling, or pain. ? Fluid or blood. ? Warmth. ? Pus or a bad smell. Activity  Do arm and shoulder exercises as told by your health care provider. This may prevent movement problems and stiffness.  Return to your normal activities as told by your health care provider. Ask your health care provider what activities  are safe for you. Avoid any activities that cause pain. Driving  Do not drive for 24 hours if you were given a medicine to help you relax (sedative).  Do not drive or use heavy machinery while taking prescription pain medicine. General instructions  If a drainage tube was left in your breast, care for it as told by your health care provider. The drain may stay in place for up to 7-10 days.  Wear a compression garment on your arm as told by your health care provider. This may help to prevent blood clots and reduce swelling in your arm.  Do not use any products that contain nicotine or tobacco, such as cigarettes and  e-cigarettes. If you need help quitting, ask your health care provider.  Do not take baths, swim, or use a hot tub until your health care provider approves. Ask your health care provider if you may take showers. You may only be allowed to take sponge baths for bathing.  Do not have your blood pressure taken, have blood drawn, or get injections or IVs in the arm on the side where your lymph nodes were removed.  Follow instructions from your health care provider about how often you should be screened for extra fluid around your lymph nodes (lymphedema).  Keep all follow-up visits as told by your health care provider. This is important. Contact a health care provider if:  Your arm is swollen, tight, and painful.  You have redness, swelling, or pain around your incision.  You have fluid or blood coming from your incision.  Your incision feels warm to the touch.  You have pus or a bad smell coming from your incision. Get help right away if:  You have severe pain that does not get better with medicine.  You have a fever or chills.  You are confused.  You have chest pain.  You have shortness of breath. Summary  After the procedure, it is common to have pain and soreness and trouble moving your arm or shoulder.  A small amount of arm swelling is normal after the procedure. However, you should contact your health care provider if your arm is swollen, tight, and painful.  Wear a compression garment on your arm as told by your health care provider. This may help to prevent blood clots and reduce swelling in your arm.  Do arm and shoulder exercises as directed to help prevent movement problems and stiffness.  If a drainage tube was left in your breast, care for it as told by your health care provider. This information is not intended to replace advice given to you by your health care provider. Make sure you discuss any questions you have with your health care provider. Document  Released: 09/12/2016 Document Revised: 11/04/2018 Document Reviewed: 09/12/2016 Elsevier Patient Education  [place], Adult, Care After These instructions give you information about caring for yourself after your procedure. Your doctor may also give you more specific instructions. If you have problems or questions, contact your doctor. Follow these instructions at home: Surgical cut (incision) care   Follow instructions from your doctor about how to take care of your surgical cut area. Make sure you: ? Wash your hands with soap and water before you change your bandage (dressing). If you cannot use soap and water, use hand sanitizer. ? Change your bandage as told by your doctor. ? Leave stitches (sutures), skin glue, or skin tape (adhesive) strips in place. They may need to  stay in place for 2 weeks or longer. If tape strips get loose and curl up, you may trim the loose edges. Do not remove tape strips completely unless your doctor says it is okay.  Check your surgical cut every day for signs of infection. Check for: ? More redness, swelling, or pain. ? More fluid or blood. ? Warmth. ? Pus or a bad smell. Activity  Do not drive or use heavy machinery while taking prescription pain medicine. Do not drive until your doctor says it is okay.  Until your doctor says it is okay: ? Do not lift anything that is heavier than 10 lb (4.5 kg). ? Do not play contact sports.  Return to your normal activities as told by your doctor. Ask your doctor what activities are safe. General instructions  To prevent or treat having a hard time pooping (constipation) while you are taking prescription pain medicine, your doctor may recommend that you: ? Drink enough fluid to keep your pee (urine) clear or pale yellow. ? Take over-the-counter or prescription medicines. ? Eat foods that are high in fiber, such as fresh fruits and vegetables, whole grains, and beans. ? Limit foods  that are high in fat and processed sugars, such as fried and sweet foods.  Take over-the-counter and prescription medicines only as told by your doctor.  Do not take baths, swim, or use a hot tub until your doctor says it is okay.  Keep all follow-up visits as told by your doctor. This is important. Contact a doctor if:  You develop a rash.  You have more redness, swelling, or pain around your surgical cut.  You have more fluid or blood coming from your surgical cut.  Your surgical cut feels warm to the touch.  You have pus or a bad smell coming from your surgical cut.  You have a fever or chills.  You have blood in your poop (stool).  You have not pooped in 2-3 days.  Medicine does not help your pain. Get help right away if:  You have chest pain or you are short of breath.  You feel light-headed.  You feel weak and dizzy (feel faint).  You have very bad pain.  You throw up (vomit) and your pain is worse. This information is not intended to replace advice given to you by your health care provider. Make sure you discuss any questions you have with your health care provider. Document Released: 08/06/2014 Document Revised: 11/07/2018 Document Reviewed: 12/28/2015 Elsevier Patient Education  2020 [place] Anesthesia, Adult, Care After This sheet gives you information about how to care for yourself after your procedure. Your health care provider may also give you more specific instructions. If you have problems or questions, contact your health care provider. What can I expect after the procedure? After the procedure, the following side effects are common:  Pain or discomfort at the IV site.  Nausea.  Vomiting.  Sore throat.  Trouble concentrating.  Feeling cold or chills.  Weak or tired.  Sleepiness and fatigue.  Soreness and body aches. These side effects can affect parts of the body that were not involved in surgery. Follow these  instructions at home:  For at least 24 hours after the procedure:  Have a responsible adult stay with you. It is important to have someone help care for you until you are awake and alert.  Rest as needed.  Do not: ? Participate in activities in which you could fall or become  injured. ? Drive. ? Use heavy machinery. ? Drink alcohol. ? Take sleeping pills or medicines that cause drowsiness. ? Make important decisions or sign legal documents. ? Take care of children on your own. Eating and drinking  Follow any instructions from your health care provider about eating or drinking restrictions.  When you feel hungry, start by eating small amounts of foods that are soft and easy to digest (bland), such as toast. Gradually return to your regular diet.  Drink enough fluid to keep your urine pale yellow.  If you vomit, rehydrate by drinking water, juice, or clear broth. General instructions  If you have sleep apnea, surgery and certain medicines can increase your risk for breathing problems. Follow instructions from your health care provider about wearing your sleep device: ? Anytime you are sleeping, including during daytime naps. ? While taking prescription pain medicines, sleeping medicines, or medicines that make you drowsy.  Return to your normal activities as told by your health care provider. Ask your health care provider what activities are safe for you.  Take over-the-counter and prescription medicines only as told by your health care provider.  If you smoke, do not smoke without supervision.  Keep all follow-up visits as told by your health care provider. This is important. Contact a health care provider if:  You have nausea or vomiting that does not get better with medicine.  You cannot eat or drink without vomiting.  You have pain that does not get better with medicine.  You are unable to pass urine.  You develop a skin rash.  You have a fever.  You have redness  around your IV site that gets worse. Get help right away if:  You have difficulty breathing.  You have chest pain.  You have blood in your urine or stool, or you vomit blood. Summary  After the procedure, it is common to have a sore throat or nausea. It is also common to feel tired.  Have a responsible adult stay with you for the first 24 hours after general anesthesia. It is important to have someone help care for you until you are awake and alert.  When you feel hungry, start by eating small amounts of foods that are soft and easy to digest (bland), such as toast. Gradually return to your regular diet.  Drink enough fluid to keep your urine pale yellow.  Return to your normal activities as told by your health care provider. Ask your health care provider what activities are safe for you. This information is not intended to replace advice given to you by your health care provider. Make sure you discuss any questions you have with your health care provider. Document Released: 10/22/2000 Document Revised: 07/19/2017 Document Reviewed: 03/01/2017 Elsevier Patient Education  2020 [employer]. How to Use Chlorhexidine for Bathing Chlorhexidine gluconate (CHG) is a germ-killing (antiseptic) solution that is used to clean the skin. It can get rid of the bacteria that normally live on the skin and can keep them away for about 24 hours. To clean your skin with CHG, you may be given:  A CHG solution to use in the shower or as part of a sponge bath.  A prepackaged cloth that contains CHG. Cleaning your skin with CHG may help lower the risk for infection:  While you are staying in the intensive care unit of the hospital.  If you have a vascular access, such as a central line, to provide short-term or long-term access to your veins.  If you have a catheter to drain urine from your bladder.  If you are on a ventilator. A ventilator is a machine that helps you breathe by moving air in and out  of your lungs.  After surgery. What are the risks? Risks of using CHG include:  A skin reaction.  Hearing loss, if CHG gets in your ears.  Eye injury, if CHG gets in your eyes and is not rinsed out.  The CHG product catching fire. Make sure that you avoid smoking and flames after applying CHG to your skin. Do not use CHG:  If you have a chlorhexidine allergy or have previously reacted to chlorhexidine.  On babies younger than 17 months of age. How to use CHG solution  Use CHG only as told by your health care provider, and follow the instructions on the label.  Use the full amount of CHG as directed. Usually, this is one bottle. During a shower Follow these steps when using CHG solution during a shower (unless your health care provider gives you different instructions): 1. Start the shower. 2. Use your normal soap and shampoo to wash your face and hair. 3. Turn off the shower or move out of the shower stream. 4. Pour the CHG onto a clean washcloth. Do not use any type of brush or rough-edged sponge. 5. Starting at your neck, lather your body down to your toes. Make sure you follow these instructions: ? If you will be having surgery, pay special attention to the part of your body where you will be having surgery. Scrub this area for at least 1 minute. ? Do not use CHG on your head or face. If the solution gets into your ears or eyes, rinse them well with water. ? Avoid your genital area. ? Avoid any areas of skin that have broken skin, cuts, or scrapes. ? Scrub your back and under your arms. Make sure to wash skin folds. 6. Let the lather sit on your skin for 1-2 minutes or as long as told by your health care provider. 7. Thoroughly rinse your entire body in the shower. Make sure that all body creases and crevices are rinsed well. 8. Dry off with a clean towel. Do not put any substances on your body afterward-such as powder, lotion, or perfume-unless you are told to do so by your  health care provider. Only use lotions that are recommended by the manufacturer. 9. Put on clean clothes or pajamas. 10. If it is the night before your surgery, sleep in clean sheets.  During a sponge bath Follow these steps when using CHG solution during a sponge bath (unless your health care provider gives you different instructions): 1. Use your normal soap and shampoo to wash your face and hair. 2. Pour the CHG onto a clean washcloth. 3. Starting at your neck, lather your body down to your toes. Make sure you follow these instructions: ? If you will be having surgery, pay special attention to the part of your body where you will be having surgery. Scrub this area for at least 1 minute. ? Do not use CHG on your head or face. If the solution gets into your ears or eyes, rinse them well with water. ? Avoid your genital area. ? Avoid any areas of skin that have broken skin, cuts, or scrapes. ? Scrub your back and under your arms. Make sure to wash skin folds. 4. Let the lather sit on your skin for 1-2 minutes or as long as  told by your health care provider. 5. Using a different clean, wet washcloth, thoroughly rinse your entire body. Make sure that all body creases and crevices are rinsed well. 6. Dry off with a clean towel. Do not put any substances on your body afterward-such as powder, lotion, or perfume-unless you are told to do so by your health care provider. Only use lotions that are recommended by the manufacturer. 7. Put on clean clothes or pajamas. 8. If it is the night before your surgery, sleep in clean sheets. How to use CHG prepackaged cloths  Only use CHG cloths as told by your health care provider, and follow the instructions on the label.  Use the CHG cloth on clean, dry skin.  Do not use the CHG cloth on your head or face unless your health care provider tells you to.  When washing with the CHG cloth: ? Avoid your genital area. ? Avoid any areas of skin that have broken  skin, cuts, or scrapes. Before surgery Follow these steps when using a CHG cloth to clean before surgery (unless your health care provider gives you different instructions): 1. Using the CHG cloth, vigorously scrub the part of your body where you will be having surgery. Scrub using a back-and-forth motion for 3 minutes. The area on your body should be completely wet with CHG when you are done scrubbing. 2. Do not rinse. Discard the cloth and let the area air-dry. Do not put any substances on the area afterward, such as powder, lotion, or perfume. 3. Put on clean clothes or pajamas. 4. If it is the night before your surgery, sleep in clean sheets.  For general bathing Follow these steps when using CHG cloths for general bathing (unless your health care provider gives you different instructions). 1. Use a separate CHG cloth for each area of your body. Make sure you wash between any folds of skin and between your fingers and toes. Wash your body in the following order, switching to a new cloth after each step: ? The front of your neck, shoulders, and chest. ? Both of your arms, under your arms, and your hands. ? Your stomach and groin area, avoiding the genitals. ? Your right leg and foot. ? Your left leg and foot. ? The back of your neck, your back, and your buttocks. 2. Do not rinse. Discard the cloth and let the area air-dry. Do not put any substances on your body afterward-such as powder, lotion, or perfume-unless you are told to do so by your health care provider. Only use lotions that are recommended by the manufacturer. 3. Put on clean clothes or pajamas. Contact a health care provider if:  Your skin gets irritated after scrubbing.  You have questions about using your solution or cloth. Get help right away if:  Your eyes become very red or swollen.  Your eyes itch badly.  Your skin itches badly and is red or swollen.  Your hearing changes.  You have trouble seeing.  You have  swelling or tingling in your mouth or throat.  You have trouble breathing.  You swallow any chlorhexidine. Summary  Chlorhexidine gluconate (CHG) is a germ-killing (antiseptic) solution that is used to clean the skin. Cleaning your skin with CHG may help to lower your risk for infection.  You may be given CHG to use for bathing. It may be in a bottle or in a prepackaged cloth to use on your skin. Carefully follow your health care provider's instructions and the instructions  on the product label.  Do not use CHG if you have a chlorhexidine allergy.  Contact your health care provider if your skin gets irritated after scrubbing. This information is not intended to replace advice given to you by your health care provider. Make sure you discuss any questions you have with your health care provider. Document Released: 04/09/2012 Document Revised: 10/02/2018 Document Reviewed: 06/13/2017 Elsevier Patient Education  2020 [employer].

## 2019-04-27 ENCOUNTER — Other Ambulatory Visit: Payer: Self-pay

## 2019-04-27 ENCOUNTER — Ambulatory Visit (HOSPITAL_COMMUNITY)
Admission: RE | Admit: 2019-04-27 | Discharge: 2019-04-27 | Disposition: A | Discharge status: Home / Self Care | Payer: 59 | Source: Ambulatory Visit | Attending: General Surgery | Admitting: General Surgery

## 2019-04-27 ENCOUNTER — Encounter (HOSPITAL_COMMUNITY): Payer: Self-pay

## 2019-04-27 ENCOUNTER — Ambulatory Visit (HOSPITAL_COMMUNITY): Payer: 59 | Admitting: Anesthesiology

## 2019-04-27 ENCOUNTER — Encounter (HOSPITAL_COMMUNITY)
Admission: RE | Disposition: A | Discharge status: Home / Self Care | Payer: Self-pay | Source: Ambulatory Visit | Attending: General Surgery

## 2019-04-27 DIAGNOSIS — Z955 Presence of coronary angioplasty implant and graft: Secondary | ICD-10-CM | POA: Insufficient documentation

## 2019-04-27 DIAGNOSIS — Z7902 Long term (current) use of antithrombotics/antiplatelets: Secondary | ICD-10-CM | POA: Diagnosis not present

## 2019-04-27 DIAGNOSIS — Z79899 Other long term (current) drug therapy: Secondary | ICD-10-CM | POA: Diagnosis not present

## 2019-04-27 DIAGNOSIS — C9110 Chronic lymphocytic leukemia of B-cell type not having achieved remission: Secondary | ICD-10-CM | POA: Insufficient documentation

## 2019-04-27 DIAGNOSIS — F17210 Nicotine dependence, cigarettes, uncomplicated: Secondary | ICD-10-CM | POA: Insufficient documentation

## 2019-04-27 DIAGNOSIS — E7800 Pure hypercholesterolemia, unspecified: Secondary | ICD-10-CM | POA: Insufficient documentation

## 2019-04-27 DIAGNOSIS — I2510 Atherosclerotic heart disease of native coronary artery without angina pectoris: Secondary | ICD-10-CM | POA: Diagnosis not present

## 2019-04-27 DIAGNOSIS — Z7982 Long term (current) use of aspirin: Secondary | ICD-10-CM | POA: Insufficient documentation

## 2019-04-27 DIAGNOSIS — R591 Generalized enlarged lymph nodes: Secondary | ICD-10-CM

## 2019-04-27 DIAGNOSIS — I10 Essential (primary) hypertension: Secondary | ICD-10-CM | POA: Insufficient documentation

## 2019-04-27 DIAGNOSIS — I252 Old myocardial infarction: Secondary | ICD-10-CM | POA: Insufficient documentation

## 2019-04-27 DIAGNOSIS — K4090 Unilateral inguinal hernia, without obstruction or gangrene, not specified as recurrent: Secondary | ICD-10-CM | POA: Diagnosis not present

## 2019-04-27 DIAGNOSIS — K219 Gastro-esophageal reflux disease without esophagitis: Secondary | ICD-10-CM | POA: Insufficient documentation

## 2019-04-27 DIAGNOSIS — D176 Benign lipomatous neoplasm of spermatic cord: Secondary | ICD-10-CM | POA: Diagnosis not present

## 2019-04-27 HISTORY — PX: AXILLARY LYMPH NODE BIOPSY: SHX5737

## 2019-04-27 HISTORY — PX: INGUINAL HERNIA REPAIR: SHX194

## 2019-04-27 SURGERY — REPAIR, HERNIA, INGUINAL, ADULT
Anesthesia: General | Laterality: Right

## 2019-04-27 MED ORDER — MIDAZOLAM HCL 2 MG/2ML IJ SOLN
INTRAMUSCULAR | Status: AC
  Filled 2019-04-27: qty 2

## 2019-04-27 MED ORDER — BUPIVACAINE LIPOSOME 1.3 % IJ SUSP
INTRAMUSCULAR | Status: AC
  Filled 2019-04-27: qty 20

## 2019-04-27 MED ORDER — HYDROCODONE-ACETAMINOPHEN 7.5-325 MG PO TABS
ORAL_TABLET | ORAL | Status: AC
  Filled 2019-04-27: qty 1

## 2019-04-27 MED ORDER — KETOROLAC TROMETHAMINE 30 MG/ML IJ SOLN
30.0000 mg | Freq: Once | INTRAMUSCULAR | Status: AC
  Administered 2019-04-27: 30 mg via INTRAVENOUS
  Filled 2019-04-27: qty 1

## 2019-04-27 MED ORDER — PHENYLEPHRINE HCL (PRESSORS) 10 MG/ML IV SOLN
INTRAVENOUS | Status: DC | PRN
  Administered 2019-04-27: 40 ug via INTRAVENOUS

## 2019-04-27 MED ORDER — PROPOFOL 10 MG/ML IV BOLUS
INTRAVENOUS | Status: DC | PRN
  Administered 2019-04-27: 200 mg via INTRAVENOUS
  Administered 2019-04-27: 50 mg via INTRAVENOUS

## 2019-04-27 MED ORDER — GLYCOPYRROLATE PF 0.2 MG/ML IJ SOSY
PREFILLED_SYRINGE | INTRAMUSCULAR | Status: AC
  Filled 2019-04-27: qty 1

## 2019-04-27 MED ORDER — LIDOCAINE HCL (CARDIAC) PF 100 MG/5ML IV SOSY
PREFILLED_SYRINGE | INTRAVENOUS | Status: DC | PRN
  Administered 2019-04-27: 80 mg via INTRAVENOUS

## 2019-04-27 MED ORDER — PROMETHAZINE HCL 25 MG/ML IJ SOLN: 6.2500 mg | INTRAMUSCULAR | Status: DC | PRN

## 2019-04-27 MED ORDER — DEXAMETHASONE SODIUM PHOSPHATE 10 MG/ML IJ SOLN
INTRAMUSCULAR | Status: AC
  Filled 2019-04-27: qty 1

## 2019-04-27 MED ORDER — ROCURONIUM BROMIDE 100 MG/10ML IV SOLN
INTRAVENOUS | Status: DC | PRN
  Administered 2019-04-27: 30 mg via INTRAVENOUS

## 2019-04-27 MED ORDER — SUCCINYLCHOLINE CHLORIDE 200 MG/10ML IV SOSY
PREFILLED_SYRINGE | INTRAVENOUS | Status: DC | PRN
  Administered 2019-04-27: 100 mg via INTRAVENOUS

## 2019-04-27 MED ORDER — SUGAMMADEX SODIUM 200 MG/2ML IV SOLN
INTRAVENOUS | Status: DC | PRN
  Administered 2019-04-27: 200 mg via INTRAVENOUS

## 2019-04-27 MED ORDER — BUPIVACAINE HCL (PF) 0.5 % IJ SOLN
INTRAMUSCULAR | Status: AC
  Filled 2019-04-27: qty 30

## 2019-04-27 MED ORDER — HYDROMORPHONE HCL 1 MG/ML IJ SOLN
INTRAMUSCULAR | Status: AC
  Filled 2019-04-27: qty 0.5

## 2019-04-27 MED ORDER — CHLORHEXIDINE GLUCONATE CLOTH 2 % EX PADS: 6.0000 | MEDICATED_PAD | Freq: Once | CUTANEOUS | Status: DC

## 2019-04-27 MED ORDER — DEXAMETHASONE SODIUM PHOSPHATE 10 MG/ML IJ SOLN
INTRAMUSCULAR | Status: DC | PRN
  Administered 2019-04-27: 10 mg via INTRAVENOUS

## 2019-04-27 MED ORDER — PROPOFOL 10 MG/ML IV BOLUS
INTRAVENOUS | Status: AC
  Filled 2019-04-27: qty 20

## 2019-04-27 MED ORDER — EPHEDRINE SULFATE 50 MG/ML IJ SOLN
INTRAMUSCULAR | Status: DC | PRN
  Administered 2019-04-27: 10 mg via INTRAVENOUS
  Administered 2019-04-27 (×2): 5 mg via INTRAVENOUS

## 2019-04-27 MED ORDER — FENTANYL CITRATE (PF) 100 MCG/2ML IJ SOLN
INTRAMUSCULAR | Status: DC | PRN
  Administered 2019-04-27 (×2): 100 ug via INTRAVENOUS

## 2019-04-27 MED ORDER — ROCURONIUM BROMIDE 10 MG/ML (PF) SYRINGE
PREFILLED_SYRINGE | INTRAVENOUS | Status: AC
  Filled 2019-04-27: qty 10

## 2019-04-27 MED ORDER — MIDAZOLAM HCL 2 MG/2ML IJ SOLN
INTRAMUSCULAR | Status: DC | PRN
  Administered 2019-04-27: 1 mg via INTRAVENOUS

## 2019-04-27 MED ORDER — EPHEDRINE 5 MG/ML INJ
INTRAVENOUS | Status: AC
  Filled 2019-04-27: qty 10

## 2019-04-27 MED ORDER — CEFAZOLIN SODIUM-DEXTROSE 2-4 GM/100ML-% IV SOLN
2.0000 g | INTRAVENOUS | Status: AC
  Administered 2019-04-27: 2 g via INTRAVENOUS
  Filled 2019-04-27: qty 100

## 2019-04-27 MED ORDER — MIDAZOLAM HCL 2 MG/2ML IJ SOLN: 0.5000 mg | Freq: Once | INTRAMUSCULAR | Status: DC | PRN

## 2019-04-27 MED ORDER — BUPIVACAINE LIPOSOME 1.3 % IJ SUSP
INTRAMUSCULAR | Status: DC | PRN
  Administered 2019-04-27: 5 mL
  Administered 2019-04-27: 15 mL

## 2019-04-27 MED ORDER — ONDANSETRON HCL 4 MG/2ML IJ SOLN
INTRAMUSCULAR | Status: DC | PRN
  Administered 2019-04-27: 4 mg via INTRAVENOUS

## 2019-04-27 MED ORDER — LACTATED RINGERS IV SOLN
INTRAVENOUS | Status: DC
  Administered 2019-04-27 (×2): via INTRAVENOUS

## 2019-04-27 MED ORDER — OXYCODONE-ACETAMINOPHEN 7.5-325 MG PO TABS: 1.0000 | ORAL_TABLET | Freq: Four times a day (QID) | ORAL | 0 refills | Status: DC | PRN

## 2019-04-27 MED ORDER — FENTANYL CITRATE (PF) 250 MCG/5ML IJ SOLN
INTRAMUSCULAR | Status: AC
  Filled 2019-04-27: qty 5

## 2019-04-27 MED ORDER — GLYCOPYRROLATE 0.2 MG/ML IJ SOLN
INTRAMUSCULAR | Status: DC | PRN
  Administered 2019-04-27: 0.1 mg via INTRAVENOUS

## 2019-04-27 MED ORDER — HYDROMORPHONE HCL 1 MG/ML IJ SOLN
0.2500 mg | INTRAMUSCULAR | Status: DC | PRN
  Administered 2019-04-27: 0.5 mg via INTRAVENOUS

## 2019-04-27 MED ORDER — SUCCINYLCHOLINE CHLORIDE 200 MG/10ML IV SOSY
PREFILLED_SYRINGE | INTRAVENOUS | Status: AC
  Filled 2019-04-27: qty 10

## 2019-04-27 MED ORDER — HYDROCODONE-ACETAMINOPHEN 7.5-325 MG PO TABS
1.0000 | ORAL_TABLET | Freq: Once | ORAL | Status: AC | PRN
  Administered 2019-04-27: 1 via ORAL

## 2019-04-27 MED ORDER — HEMOSTATIC AGENTS (NO CHARGE) OPTIME
TOPICAL | Status: DC | PRN
  Administered 2019-04-27: 1 via TOPICAL

## 2019-04-27 MED ORDER — SODIUM CHLORIDE 0.9 % IR SOLN
Status: DC | PRN
  Administered 2019-04-27: 1000 mL

## 2019-04-27 SURGICAL SUPPLY — 50 items
APPLIER CLIP 11 MED OPEN (CLIP)
APPLIER CLIP 9.375 SM OPEN (CLIP) ×4
BLADE SURG 15 STRL LF DISP TIS (BLADE) ×2 IMPLANT
BLADE SURG 15 STRL SS (BLADE) ×2
CHLORAPREP W/TINT 26 (MISCELLANEOUS) ×4 IMPLANT
CLIP APPLIE 11 MED OPEN (CLIP) IMPLANT
CLIP APPLIE 9.375 SM OPEN (CLIP) ×2 IMPLANT
CLOTH BEACON ORANGE TIMEOUT ST (SAFETY) ×4 IMPLANT
CONT SPEC 4OZ CLIKSEAL STRL BL (MISCELLANEOUS) ×4 IMPLANT
COVER LIGHT HANDLE STERIS (MISCELLANEOUS) ×8 IMPLANT
COVER WAND RF STERILE (DRAPES) ×4 IMPLANT
DECANTER SPIKE VIAL GLASS SM (MISCELLANEOUS) ×4 IMPLANT
DERMABOND ADVANCED (GAUZE/BANDAGES/DRESSINGS) ×4
DERMABOND ADVANCED .7 DNX12 (GAUZE/BANDAGES/DRESSINGS) ×4 IMPLANT
DRAIN PENROSE 18X1/2 LTX STRL (DRAIN) ×4 IMPLANT
ELECT REM PT RETURN 9FT ADLT (ELECTROSURGICAL) ×4
ELECTRODE REM PT RTRN 9FT ADLT (ELECTROSURGICAL) ×2 IMPLANT
GAUZE SPONGE 4X4 12PLY STRL (GAUZE/BANDAGES/DRESSINGS) ×4 IMPLANT
GLOVE BIOGEL PI IND STRL 7.0 (GLOVE) ×6 IMPLANT
GLOVE BIOGEL PI INDICATOR 7.0 (GLOVE) ×6
GLOVE SURG SS PI 7.5 STRL IVOR (GLOVE) ×4 IMPLANT
GOWN STRL REUS W/TWL LRG LVL3 (GOWN DISPOSABLE) ×12 IMPLANT
HEMOSTAT ARISTA ABSORB 1G (HEMOSTASIS) ×4 IMPLANT
INST SET MINOR GENERAL (KITS) ×4 IMPLANT
KIT TURNOVER KIT A (KITS) ×4 IMPLANT
MANIFOLD NEPTUNE II (INSTRUMENTS) ×4 IMPLANT
MESH HERNIA 1.6X1.9 PLUG LRG (Mesh General) ×2 IMPLANT
MESH HERNIA PLUG LRG (Mesh General) ×2 IMPLANT
NEEDLE HYPO 18GX1.5 BLUNT FILL (NEEDLE) ×4 IMPLANT
NEEDLE HYPO 21X1.5 SAFETY (NEEDLE) ×4 IMPLANT
NEEDLE HYPO 25X1 1.5 SAFETY (NEEDLE) ×4 IMPLANT
NS IRRIG 1000ML POUR BTL (IV SOLUTION) ×4 IMPLANT
PACK MINOR (CUSTOM PROCEDURE TRAY) ×4 IMPLANT
PAD ARMBOARD 7.5X6 YLW CONV (MISCELLANEOUS) ×4 IMPLANT
SET BASIN LINEN APH (SET/KITS/TRAYS/PACK) ×4 IMPLANT
SOL PREP PROV IODINE SCRUB 4OZ (MISCELLANEOUS) ×4 IMPLANT
SPONGE INTESTINAL PEANUT (DISPOSABLE) IMPLANT
SPONGE LAP 18X18 RF (DISPOSABLE) ×4 IMPLANT
SUT MNCRL AB 4-0 PS2 18 (SUTURE) ×8 IMPLANT
SUT NOVA NAB GS-22 2 2-0 T-19 (SUTURE) ×8 IMPLANT
SUT PROLENE 2 0 SH 30 (SUTURE) IMPLANT
SUT SILK 3 0 (SUTURE)
SUT SILK 3-0 18XBRD TIE 12 (SUTURE) IMPLANT
SUT VIC AB 2-0 CT1 27 (SUTURE) ×2
SUT VIC AB 2-0 CT1 TAPERPNT 27 (SUTURE) ×2 IMPLANT
SUT VIC AB 3-0 SH 27 (SUTURE) ×4
SUT VIC AB 3-0 SH 27X BRD (SUTURE) ×4 IMPLANT
SUT VICRYL AB 3 0 TIES (SUTURE) ×4 IMPLANT
SYR 20ML LL LF (SYRINGE) ×8 IMPLANT
SYR CONTROL 10ML LL (SYRINGE) ×4 IMPLANT

## 2019-04-27 NOTE — Anesthesia Postprocedure Evaluation (Signed)
Anesthesia Post Note  Patient: Patrick Rubio  Procedure(s) Performed: HERNIA REPAIR INGUINAL ADULT (Right ) AXILLARY LYMPH NODE BIOPSY (Left )  Patient location during evaluation: PACU Anesthesia Type: General Level of consciousness: awake and alert Pain management: pain level controlled Vital Signs Assessment: post-procedure vital signs reviewed and stable Respiratory status: spontaneous breathing Cardiovascular status: stable Postop Assessment: no apparent nausea or vomiting Anesthetic complications: no     Last Vitals:  Vitals:   04/27/19 0759  BP: 135/90  Pulse: (!) 57  Resp: 16  Temp: 36.7 C  SpO2: 98%    Last Pain:  Vitals:   04/27/19 0759  TempSrc: Oral  PainSc: 7                  Everette Rank

## 2019-04-27 NOTE — Anesthesia Preprocedure Evaluation (Addendum)
Anesthesia Evaluation  Patient identified by MRN, date of birth, ID band Patient awake    Reviewed: Allergy & Precautions, NPO status , Patient's Chart, lab work & pertinent test results  Airway Mallampati: I  TM Distance: >3 FB Neck ROM: Full    Dental no notable dental hx. (+) Teeth Intact   Pulmonary neg pulmonary ROS, Current Smoker and Patient abstained from smoking.,    Pulmonary exam normal breath sounds clear to auscultation       Cardiovascular Exercise Tolerance: Good hypertension, Pt. on medications and Pt. on home beta blockers + CAD and + Past MI  Normal cardiovascular examI Rhythm:Regular Rate:Normal  Reports MI/Stent PTCA in 2003- noew reports good ET Denies recent CP/MI/DOE or nitrate use   Neuro/Psych negative neurological ROS  negative psych ROS   GI/Hepatic Neg liver ROS, GERD  Medicated and Controlled,  Endo/Other  negative endocrine ROS  Renal/GU negative Renal ROS  negative genitourinary   Musculoskeletal negative musculoskeletal ROS (+)   Abdominal   Peds negative pediatric ROS (+)  Hematology negative hematology ROS (+)   Anesthesia Other Findings   Reproductive/Obstetrics negative OB ROS                            Anesthesia Physical Anesthesia Plan  ASA: III  Anesthesia Plan: General   Post-op Pain Management:    Induction: Intravenous  PONV Risk Score and Plan: 1 and Midazolam, Treatment may vary due to age or medical condition and Ondansetron  Airway Management Planned: Oral ETT  Additional Equipment:   Intra-op Plan:   Post-operative Plan:   Informed Consent: I have reviewed the patients History and Physical, chart, labs and discussed the procedure including the risks, benefits and alternatives for the proposed anesthesia with the patient or authorized representative who has indicated his/her understanding and acceptance.     Dental advisory  given  Plan Discussed with: CRNA  Anesthesia Plan Comments: (Plan Full PPE use Plan GETA D/W PT -WTP with same after Q&A)        Anesthesia Quick Evaluation

## 2019-04-27 NOTE — Op Note (Signed)
Patient:  Patrick Rubio  DOB:  03/15/1961  MRN:  [id number]   Preop Diagnosis: Generalized lymphadenopathy, right inguinal hernia  Postop Diagnosis: Same  Procedure: Right inguinal herniorrhaphy with mesh, left axillary lymph node biopsy  Surgeon: Aviva Signs, MD  Assistant: Curlene Labrum, MD  Anes: General endotracheal  Indications: Patient is a 58 year old white male who was referred to my care by Dr. Delton Coombes of oncology for left axillary lymph node biopsy.  In addition, he has a significant symptomatic right inguinal hernia.  It was elected to proceed with a left axillary lymph node biopsy and right inguinal herniorrhaphy with mesh as both are clean cases.  The risks and benefits of both procedures including bleeding, infection, mesh use, recurrence of the hernia, and the possibility of left arm pain and swelling were fully explained to the patient, who gave informed consent.  Procedure note: The patient was placed in the supine position.  After induction of general endotracheal anesthesia, the right groin region was prepped and draped using the usual sterile technique with ChloraPrep.  Surgical site confirmation was performed.  An incision was made in the right groin region down to the external oblique aponeurosis.  The aponeurosis was incised to the external ring.  A Penrose drain was placed around the spermatic cord.  The vas deferens was noted within the spermatic cord.  A lipoma of the cord was found and a high ligation was performed using a 3-0 Vicryl tie.  The patient did have an indirect hernia sac.  This was freed away from the spermatic cord up to the peritoneal reflection and inverted.  A large Bard PerFix plug was then inserted.  An onlay patch was placed along the floor of the inguinal canal and secured superiorly to the conjoined tendon and inferiorly to the shelving edge of Poupart's ligament using a 2-0 Novafil interrupted suture.  The internal ring was re-created using  a 2-0 Novafil interrupted suture.  The external oblique aponeurosis was reapproximated using a 2-0 Vicryl running suture.  The subcutaneous layer was reapproximated using a 3-0 Vicryl interrupted suture.  Exparel was instilled into the surrounding wound.  The skin was closed using a 4-0 Monocryl subcuticular suture.  Dermabond was applied.  Next, a left axillary lymph node biopsy was performed.  An incision was made in the left axilla.  I then explored the left axilla and was able to find a 1.5 cm oval, abnormally enlarged lymph node.  Small clips were used to ligate any small vessels.  The lymph node was removed and sent fresh to pathology for flow cytometry.  A bleeding was controlled using small clips.  Arista was placed in the base of the incision.  The subcutaneous layer was reapproximated using a 3-0 Vicryl interrupted suture.  Exparel was instilled into the surrounding wound.  The skin was closed using a 4-0 Monocryl subcuticular suture.  Dermabond was applied.  All tape and needle counts were correct after both procedures.  The patient was extubated in the operating room and transferred to PACU in stable condition.  Complications: None  EBL: Minimal  Specimen: Left axillary lymph node

## 2019-04-27 NOTE — Interval H&P Note (Signed)
History and Physical Interval Note:  04/27/2019 8:35 AM  Patrick Rubio  has presented today for surgery, with the diagnosis of Right inguinal hernia and lymphadenopathy.  The various methods of treatment have been discussed with the patient and family. After consideration of risks, benefits and other options for treatment, the patient has consented to  Procedure(s): HERNIA REPAIR INGUINAL ADULT (Right) AXILLARY LYMPH NODE BIOPSY (Left) as a surgical intervention.  The patient's history has been reviewed, patient examined, no change in status, stable for surgery.  I have reviewed the patient's chart and labs.  Questions were answered to the patient's satisfaction.     Aviva Signs

## 2019-04-27 NOTE — Transfer of Care (Signed)
Immediate Anesthesia Transfer of Care Note  Patient: Patrick Rubio  Procedure(s) Performed: HERNIA REPAIR INGUINAL ADULT (Right ) AXILLARY LYMPH NODE BIOPSY (Left )  Patient Location: PACU  Anesthesia Type:General  Level of Consciousness: awake, alert , oriented and patient cooperative  Airway & Oxygen Therapy: Patient Spontanous Breathing and Patient connected to face mask oxygen  Post-op Assessment: Report given to RN and Post -op Vital signs reviewed and stable  Post vital signs: Reviewed and stable  Last Vitals:  Vitals Value Taken Time  BP    Temp    Pulse    Resp    SpO2      Last Pain:  Vitals:   04/27/19 0759  TempSrc: Oral  PainSc: 7       Patients Stated Pain Goal: 8 [phone]  Complications: No apparent anesthesia complications

## 2019-04-27 NOTE — Discharge Instructions (Signed)
Open Hernia Repair, Adult, Care After °This sheet gives you information about how to care for yourself after your procedure. Your health care provider may also give you more specific instructions. If you have problems or questions, contact your health care provider. °What can I expect after the procedure? °After the procedure, it is common to have: °· Mild discomfort. °· Slight bruising. °· Minor swelling. °· Pain in the abdomen. °Follow these instructions at home: °Incision care ° °· Follow instructions from your health care provider about how to take care of your incision area. Make sure you: °? Wash your hands with soap and water before you change your bandage (dressing). If soap and water are not available, use hand sanitizer. °? Change your dressing as told by your health care provider. °? Leave stitches (sutures), skin glue, or adhesive strips in place. These skin closures may need to stay in place for 2 weeks or longer. If adhesive strip edges start to loosen and curl up, you may trim the loose edges. Do not remove adhesive strips completely unless your health care provider tells you to do that. °· Check your incision area every day for signs of infection. Check for: °? More redness, swelling, or pain. °? More fluid or blood. °? Warmth. °? Pus or a bad smell. °Activity °· Do not drive or use heavy machinery while taking prescription pain medicine. Do not drive until your health care provider approves. °· Until your health care provider approves: °? Do not lift anything that is heavier than 10 lb (4.5 kg). °? Do not play contact sports. °· Return to your normal activities as told by your health care provider. Ask your health care provider what activities are safe. °General instructions °· To prevent or treat constipation while you are taking prescription pain medicine, your health care provider may recommend that you: °? Drink enough fluid to keep your urine clear or pale yellow. °? Take over-the-counter or  prescription medicines. °? Eat foods that are high in fiber, such as fresh fruits and vegetables, whole grains, and beans. °? Limit foods that are high in fat and processed sugars, such as fried and sweet foods. °· Take over-the-counter and prescription medicines only as told by your health care provider. °· Do not take tub baths or go swimming until your health care provider approves. °· Keep all follow-up visits as told by your health care provider. This is important. °Contact a health care provider if: °· You develop a rash. °· You have more redness, swelling, or pain around your incision. °· You have more fluid or blood coming from your incision. °· Your incision feels warm to the touch. °· You have pus or a bad smell coming from your incision. °· You have a fever or chills. °· You have blood in your stool (feces). °· You have not had a bowel movement in 2-3 days. °· Your pain is not controlled with medicine. °Get help right away if: °· You have chest pain or shortness of breath. °· You feel light-headed or feel faint. °· You have severe pain. °· You vomit and your pain is worse. °This information is not intended to replace advice given to you by your health care provider. Make sure you discuss any questions you have with your health care provider. °Document Released: 02/02/2005 Document Revised: 06/28/2017 Document Reviewed: 12/28/2015 °Elsevier Patient Education © 2020 [employer] Inc[employer] ° ° ° ° °General Anesthesia, Adult, Care After °This sheet gives you information about how to care for   yourself after your procedure. Your health care provider may also give you more specific instructions. If you have problems or questions, contact your health care provider. °What can I expect after the procedure? °After the procedure, the following side effects are common: °· Pain or discomfort at the IV site. °· Nausea. °· Vomiting. °· Sore throat. °· Trouble concentrating. °· Feeling cold or chills. °· Weak or  tired. °· Sleepiness and fatigue. °· Soreness and body aches. These side effects can affect parts of the body that were not involved in surgery. °Follow these instructions at home: ° °For at least 24 hours after the procedure: °· Have a responsible adult stay with you. It is important to have someone help care for you until you are awake and alert. °· Rest as needed. °· Do not: °? Participate in activities in which you could fall or become injured. °? Drive. °? Use heavy machinery. °? Drink alcohol. °? Take sleeping pills or medicines that cause drowsiness. °? Make important decisions or sign legal documents. °? Take care of children on your own. °Eating and drinking °· Follow any instructions from your health care provider about eating or drinking restrictions. °· When you feel hungry, start by eating small amounts of foods that are soft and easy to digest (bland), such as toast. Gradually return to your regular diet. °· Drink enough fluid to keep your urine pale yellow. °· If you vomit, rehydrate by drinking water, juice, or clear broth. °General instructions °· If you have sleep apnea, surgery and certain medicines can increase your risk for breathing problems. Follow instructions from your health care provider about wearing your sleep device: °? Anytime you are sleeping, including during daytime naps. °? While taking prescription pain medicines, sleeping medicines, or medicines that make you drowsy. °· Return to your normal activities as told by your health care provider. Ask your health care provider what activities are safe for you. °· Take over-the-counter and prescription medicines only as told by your health care provider. °· If you smoke, do not smoke without supervision. °· Keep all follow-up visits as told by your health care provider. This is important. °Contact a health care provider if: °· You have nausea or vomiting that does not get better with medicine. °· You cannot eat or drink without  vomiting. °· You have pain that does not get better with medicine. °· You are unable to pass urine. °· You develop a skin rash. °· You have a fever. °· You have redness around your IV site that gets worse. °Get help right away if: °· You have difficulty breathing. °· You have chest pain. °· You have blood in your urine or stool, or you vomit blood. °Summary °· After the procedure, it is common to have a sore throat or nausea. It is also common to feel tired. °· Have a responsible adult stay with you for the first 24 hours after general anesthesia. It is important to have someone help care for you until you are awake and alert. °· When you feel hungry, start by eating small amounts of foods that are soft and easy to digest (bland), such as toast. Gradually return to your regular diet. °· Drink enough fluid to keep your urine pale yellow. °· Return to your normal activities as told by your health care provider. Ask your health care provider what activities are safe for you. °This information is not intended to replace advice given to you by your health care provider. Make sure   you discuss any questions you have with your health care provider. °Document Released: 10/22/2000 Document Revised: 07/19/2017 Document Reviewed: 03/01/2017 °Elsevier Patient Education © 2020 Elsevier Inc. ° °

## 2019-04-27 NOTE — Anesthesia Procedure Notes (Signed)
Procedure Name: Intubation Date/Time: 04/27/2019 9:34 AM Performed by: Georgeanne Nim, CRNA Pre-anesthesia Checklist: Patient identified, Patient being monitored, Timeout performed, Emergency Drugs available and Suction available Patient Re-evaluated:Patient Re-evaluated prior to induction Oxygen Delivery Method: Circle System Utilized Preoxygenation: Pre-oxygenation with 100% oxygen Induction Type: IV induction Ventilation: Mask ventilation without difficulty Laryngoscope Size: Miller and 2 Grade View: Grade II Tube type: Oral Tube size: 7.5 mm Number of attempts: 1 Airway Equipment and Method: stylet Placement Confirmation: ETT inserted through vocal cords under direct vision,  positive ETCO2 and breath sounds checked- equal and bilateral Secured at: 21 cm Tube secured with: Tape Dental Injury: Teeth and Oropharynx as per pre-operative assessment

## 2019-04-28 ENCOUNTER — Encounter (HOSPITAL_COMMUNITY): Payer: Self-pay | Admitting: General Surgery

## 2019-04-28 ENCOUNTER — Other Ambulatory Visit: Payer: Self-pay | Admitting: General Surgery

## 2019-04-29 LAB — SURGICAL PATHOLOGY

## 2019-05-01 ENCOUNTER — Telehealth (INDEPENDENT_AMBULATORY_CARE_PROVIDER_SITE_OTHER): Payer: Self-pay

## 2019-05-01 DIAGNOSIS — Z09 Encounter for follow-up examination after completed treatment for conditions other than malignant neoplasm: Secondary | ICD-10-CM

## 2019-05-04 ENCOUNTER — Encounter (HOSPITAL_COMMUNITY): Payer: Self-pay | Admitting: Hematology

## 2019-05-04 ENCOUNTER — Inpatient Hospital Stay (HOSPITAL_COMMUNITY): Payer: 59 | Attending: Hematology | Admitting: Hematology

## 2019-05-04 ENCOUNTER — Other Ambulatory Visit: Payer: Self-pay

## 2019-05-04 DIAGNOSIS — R1011 Right upper quadrant pain: Secondary | ICD-10-CM | POA: Diagnosis not present

## 2019-05-04 DIAGNOSIS — F17210 Nicotine dependence, cigarettes, uncomplicated: Secondary | ICD-10-CM | POA: Insufficient documentation

## 2019-05-04 DIAGNOSIS — C9110 Chronic lymphocytic leukemia of B-cell type not having achieved remission: Secondary | ICD-10-CM | POA: Diagnosis present

## 2019-05-04 DIAGNOSIS — I10 Essential (primary) hypertension: Secondary | ICD-10-CM | POA: Insufficient documentation

## 2019-05-04 DIAGNOSIS — C8300 Small cell B-cell lymphoma, unspecified site: Secondary | ICD-10-CM

## 2019-05-04 DIAGNOSIS — Z79899 Other long term (current) drug therapy: Secondary | ICD-10-CM | POA: Diagnosis not present

## 2019-05-04 DIAGNOSIS — R634 Abnormal weight loss: Secondary | ICD-10-CM | POA: Diagnosis not present

## 2019-05-04 DIAGNOSIS — R590 Localized enlarged lymph nodes: Secondary | ICD-10-CM | POA: Diagnosis present

## 2019-05-04 NOTE — Progress Notes (Signed)
Patrick Rubio, Patrick Rubio [id number]   CLINIC:  Medical Oncology/Hematology  PCP:  Orpah Greek, MD [address], [address] [place] [address] [phone]   REASON FOR VISIT:  Small lymphocytic lymphoma.  CURRENT THERAPY: Work-up in progress.    INTERVAL HISTORY:  Patrick Rubio 58 y.o. male seen for follow-up of mesenteric and retroperitoneal adenopathy.  He continues to have right upper quadrant pain, worse on eating.  He had left axillary lymph node biopsy done on 04/27/2019.  He smokes a pack of cigarettes a day.  He had 56 pound weight loss in the last 1 and half year.  Appetite is 25%.  Energy levels are 100%.  He tried drinking boost but had diarrhea.  Denies any fevers or night sweats.    REVIEW OF SYSTEMS:  Review of Systems  Gastrointestinal: Positive for abdominal pain.  All other systems reviewed and are negative.    PAST MEDICAL/SURGICAL HISTORY:  Past Medical History:  Diagnosis Date  . CAD (coronary artery disease)   . GERD (gastroesophageal reflux disease)   . HTN (hypertension)   . Hypercholesterolemia   . MI (myocardial infarction) ([place]) 2003   Past Surgical History:  Procedure Laterality Date  . AXILLARY LYMPH NODE BIOPSY Left 04/27/2019   Procedure: AXILLARY LYMPH NODE BIOPSY;  Surgeon: Aviva Signs, MD;  Location: [facility]S;  Service: General;  Laterality: Left;  . BIOPSY  11/26/2018   Procedure: BIOPSY;  Surgeon: Daneil Dolin, MD;  Location: [facility] SUITE;  Service: Endoscopy;;  . CORONARY ANGIOPLASTY WITH STENT PLACEMENT     2003  . ESOPHAGOGASTRODUODENOSCOPY N/A 11/26/2018   mild erosive reflux esophagitis, gastric erythema. Negative H.pylori   . INGUINAL HERNIA REPAIR Right 04/27/2019   Procedure: HERNIA REPAIR INGUINAL ADULT;  Surgeon: Aviva Signs, MD;  Location: [facility];  Service: General;  Laterality: Right;  . KNEE SURGERY Right   . lumbar fusion with cage  07/2017  . NASAL SINUS SURGERY    . TONSILLECTOMY  AND ADENOIDECTOMY       SOCIAL HISTORY:  Social History   Socioeconomic History  . Marital status: Divorced    Spouse name: Not on file  . Number of children: 2  . Years of education: Not on file  . Highest education level: Not on file  Occupational History  . Occupation: employed    Comment: part time at [employer]  . Financial resource strain: Not hard at all  . Food insecurity    Worry: Never true    Inability: Never true  . Transportation needs    Medical: No    Non-medical: No  Tobacco Use  . Smoking status: Current Every Day Smoker    Packs/day: 0.50    Years: 25.00    Pack years: 12.50    Types: Cigarettes  . Smokeless tobacco: Never Used  Substance and Sexual Activity  . Alcohol use: Yes    Comment: occ  . Drug use: Never  . Sexual activity: Yes  Lifestyle  . Physical activity    Days per week: 4 days    Minutes per session: 60 min  . Stress: Not at all  Relationships  . Social connections    Talks on phone: More than three times a week    Gets together: More than three times a week    Attends religious service: More than 4 times per year    Active member of club or organization: Yes  Attends meetings of clubs or organizations: 1 to 4 times per year    Relationship status: Divorced  . Intimate partner violence    Fear of current or ex partner: No    Emotionally abused: No    Physically abused: No    Forced sexual activity: No  Other Topics Concern  . Not on file  Social History Narrative  . Not on file    FAMILY HISTORY:  Family History  Problem Relation Age of Onset  . Colon polyps Father        thinks he may have had polyps, possibly in his 53s.   [name] Heart attack Father   . Dementia Mother   . Hypotension Mother   . Stroke Sister   . Diabetes Maternal Grandmother   . Stroke Maternal Grandfather   . Cancer Paternal Grandmother   . Congestive Heart Failure Paternal Grandfather   . Kidney cancer Daughter   . Colon  cancer Neg Hx   . Pancreatic cancer Neg Hx   . Liver disease Neg Hx     CURRENT MEDICATIONS:  Outpatient Encounter Medications as of 05/04/2019  Medication Sig Note  . baclofen (LIORESAL) 10 MG tablet Take 10 mg by mouth 2 (two) times daily as needed for muscle spasms.    . cimetidine (TAGAMET) 200 MG tablet Take 200 mg by mouth 2 (two) times daily.   . clopidogrel (PLAVIX) 75 MG tablet Take 75 mg by mouth daily.  04/23/2019: On hold for surgery, last dose was 04/21/2019.  [name] escitalopram (LEXAPRO) 20 MG tablet Take 20 mg by mouth daily.    [name] gabapentin (NEURONTIN) 100 MG capsule Take 100 mg by mouth 2 (two) times daily as needed (pain).    [name] lisinopril (PRINIVIL,ZESTRIL) 10 MG tablet Take 10 mg by mouth daily.    . pantoprazole (PROTONIX) 40 MG tablet Take 40 mg by mouth 2 (two) times daily.   . simvastatin (ZOCOR) 40 MG tablet Take 40 mg by mouth every other day.    . naproxen sodium (ALEVE) 220 MG tablet Take 220-440 mg by mouth 2 (two) times daily as needed (pain).    [name] oxyCODONE-acetaminophen (PERCOCET) 7.5-325 MG tablet Take 1 tablet by mouth every 6 (six) hours as needed. (Patient not taking: Reported on 05/04/2019)   . [DISCONTINUED] SEMAGLUTIDE, 1 MG/DOSE, [place] Inject 3 mg into the skin every Thursday. Receives this medication from [facility] with Dr. Orpah Greek as part of a research trial ('3mg'$  dose is not commercially available)    No facility-administered encounter medications on file as of 05/04/2019.     ALLERGIES:  No Known Allergies   PHYSICAL EXAM:  ECOG Performance status: 1  Vitals:   05/04/19 1536  BP: 118/75  Pulse: 70  Resp: 18  Temp: (!) 97.3 F (36.3 C)  SpO2: 100%   Filed Weights   05/04/19 1536  Weight: 185 lb 4.8 oz (84.1 kg)    Physical Exam Vitals signs reviewed.  Constitutional:      Appearance: Normal appearance.  Cardiovascular:     Rate and Rhythm: Normal rate and regular rhythm.     Heart sounds: Normal heart sounds.   Pulmonary:     Effort: Pulmonary effort is normal.     Breath sounds: Normal breath sounds.  Abdominal:     General: There is no distension.     Palpations: Abdomen is soft. There is no mass.  Musculoskeletal:        General: No swelling.  Skin:    General: Skin is warm.  Neurological:     General: No focal deficit present.     Mental Status: He is alert and oriented to person, place, and time.  Psychiatric:        Mood and Affect: Mood normal.        Behavior: Behavior normal.    Swelling at the left axillary lymph node biopsy site with no erythema or tenderness.  LABORATORY DATA:  I have reviewed the labs as listed.  CBC    Component Value Date/Time   WBC 8.7 03/12/2019 0847   RBC 4.60 03/12/2019 0847   HGB 13.2 03/12/2019 0847   HCT 40.7 03/12/2019 0847   PLT 189 03/12/2019 0847   MCV 88.5 03/12/2019 0847   MCH 28.7 03/12/2019 0847   MCHC 32.4 03/12/2019 0847   RDW 13.5 03/12/2019 0847   LYMPHSABS 2.7 03/12/2019 0847   MONOABS 0.5 03/12/2019 0847   EOSABS 0.4 03/12/2019 0847   BASOSABS 0.1 03/12/2019 0847   CMP Latest Ref Rng & Units 03/12/2019  Glucose 70 - 99 mg/dL 75  BUN 6 - 20 mg/dL 17  Creatinine 0.61 - 1.24 mg/dL 0.80  Sodium 135 - 145 mmol/L 136  Potassium 3.5 - 5.1 mmol/L 3.8  Chloride 98 - 111 mmol/L 105  CO2 22 - 32 mmol/L 24  Calcium 8.9 - 10.3 mg/dL 8.6(L)  Total Protein 6.5 - 8.1 g/dL 6.4(L)  Total Bilirubin 0.3 - 1.2 mg/dL 0.9  Alkaline Phos 38 - 126 U/L 52  AST 15 - 41 U/L 15  ALT 0 - 44 U/L 11       DIAGNOSTIC IMAGING:  I have independently reviewed the scans and discussed with the patient.    ASSESSMENT & PLAN:   Small lymphocytic lymphoma ([place]) 1.  Clinical stage IIIb small lymphocytic lymphoma: - 56 pound weight loss in the last 1 and half year.  No fevers or night sweats. - Right upper quadrant pain for the past 4 months, worse on eating and better on lying on left side. -PET/CT scan on 04/13/2019 showed pathologically  enlarged mesenteric and retroperitoneal adenopathy.  SUV ranged from 2.6-3.2.  Left external iliac lymph node 0.9 cm, SUV 2.  1.1 cm left axillary lymph node, SUV 2.1.  Accentuated activity in the left sternoclavicular joint, maximum SUV 5.3, likely secondary to mild arthropathy. - Left axillary lymph node biopsy on 04/27/2019 consistent with small lymphocytic lymphoma. - I had a prolonged discussion with the patient about his new diagnosis.  I think he requires treatment based on his unexplained weight loss.  He is also being evaluated by Dr. Arnoldo Morale for possible cholecystectomy to alleviate the pain. - I have recommended bone marrow biopsy for accurate staging. -I have also recommended hepatitis panel.  We will check FISH for del 17 P, and IGH V mutation status.  We will also check TP 53 mutation status.  We will also do karyotype on the bone marrow biopsy. - He will be a candidate for ibrutinib or venetoclax plus obinutuzumab regimen. -We will schedule him for bone marrow biopsy as soon as possible.  2.  Weight loss: -He had 56 pound weight loss in the last 1 and half year. - This could be from combination of lymphoma and likely gallbladder issues. -He tried drinking boost but had diarrhea.  We have recommended drinking boost breeze.   Total time spent is 40 minutes with more than 50% of the time spent face-to-face discussing biopsy results, new  diagnosis, further work-up, management options, counseling and coordination of care.  Orders placed this encounter:  No orders of the defined types were placed in this encounter.     Derek Jack, MD [place] [phone]

## 2019-05-04 NOTE — Patient Instructions (Signed)
[facility] at [facility] Discharge Instructions  You were seen today by Dr. Katragadda. He went over your recent lab and biopsy results. He discussed your diagnosis as well as the treatment you could receive. Your type of Lymphoma can be treated but not cured. He will get you scheduled for a bone marrow biopsy. He will send for further testing on your recent biopsy. He will see you back after the bone marrow biopsy for follow up.   Thank you for choosing [facility] at [facility] to provide your oncology and hematology care.  To afford each patient quality time with our provider, please arrive at least 15 minutes before your scheduled appointment time.   If you have a lab appointment with the Cancer Center please come in thru the  Main Entrance and check in at the main information desk  You need to re-schedule your appointment should you arrive 10 or more minutes late.  We strive to give you quality time with our providers, and arriving late affects you and other patients whose appointments are after yours.  Also, if you no show three or more times for appointments you may be dismissed from the clinic at the providers discretion.     Again, thank you for choosing [facility].  Our hope is that these requests will decrease the amount of time that you wait before being seen by our physicians.       _____________________________________________________________  Should you have questions after your visit to [facility], please contact our office at [phone] between the hours of 8:00 a.m. and 4:30 p.m.  Voicemails left after 4:00 p.m. will not be returned until the following business day.  For prescription refill requests, have your pharmacy contact our office and allow 72 hours.    Cancer Center Support Programs:   > Cancer Support Group  2nd Tuesday of the month 1pm-2pm, Journey Room    

## 2019-05-04 NOTE — Assessment & Plan Note (Addendum)
1.  Clinical stage IIIb small lymphocytic lymphoma: - 56 pound weight loss in the last 1 and half year.  No fevers or night sweats. - Right upper quadrant pain for the past 4 months, worse on eating and better on lying on left side. -PET/CT scan on 04/13/2019 showed pathologically enlarged mesenteric and retroperitoneal adenopathy.  SUV ranged from 2.6-3.2.  Left external iliac lymph node 0.9 cm, SUV 2.  1.1 cm left axillary lymph node, SUV 2.1.  Accentuated activity in the left sternoclavicular joint, maximum SUV 5.3, likely secondary to mild arthropathy. - Left axillary lymph node biopsy on 04/27/2019 consistent with small lymphocytic lymphoma. - I had a prolonged discussion with the patient about his new diagnosis.  I think he requires treatment based on his unexplained weight loss.  He is also being evaluated by Dr. Arnoldo Morale for possible cholecystectomy to alleviate the pain. - I have recommended bone marrow biopsy for accurate staging. -I have also recommended hepatitis panel.  We will check FISH for del 17 P, and IGH V mutation status.  We will also check TP 53 mutation status.  We will also do karyotype on the bone marrow biopsy. - He will be a candidate for ibrutinib or venetoclax plus obinutuzumab regimen. -We will schedule him for bone marrow biopsy as soon as possible.  2.  Weight loss: -He had 56 pound weight loss in the last 1 and half year. - This could be from combination of lymphoma and likely gallbladder issues. -He tried drinking boost but had diarrhea.  We have recommended drinking boost breeze.

## 2019-05-05 ENCOUNTER — Ambulatory Visit (INDEPENDENT_AMBULATORY_CARE_PROVIDER_SITE_OTHER): Payer: Self-pay | Admitting: General Surgery

## 2019-05-05 ENCOUNTER — Inpatient Hospital Stay (HOSPITAL_BASED_OUTPATIENT_CLINIC_OR_DEPARTMENT_OTHER): Payer: 59 | Admitting: Hematology

## 2019-05-05 ENCOUNTER — Encounter (HOSPITAL_COMMUNITY): Payer: Self-pay | Admitting: Hematology

## 2019-05-05 ENCOUNTER — Encounter: Payer: Self-pay | Admitting: General Surgery

## 2019-05-05 ENCOUNTER — Inpatient Hospital Stay (HOSPITAL_COMMUNITY): Payer: 59 | Admitting: Hematology

## 2019-05-05 ENCOUNTER — Other Ambulatory Visit: Payer: Self-pay

## 2019-05-05 VITALS — BP 111/77 | HR 66 | Temp 97.5°F | Resp 16 | Ht 70.0 in | Wt 187.0 lb

## 2019-05-05 DIAGNOSIS — C8300 Small cell B-cell lymphoma, unspecified site: Secondary | ICD-10-CM

## 2019-05-05 DIAGNOSIS — R590 Localized enlarged lymph nodes: Secondary | ICD-10-CM

## 2019-05-05 DIAGNOSIS — Z09 Encounter for follow-up examination after completed treatment for conditions other than malignant neoplasm: Secondary | ICD-10-CM

## 2019-05-05 DIAGNOSIS — C9110 Chronic lymphocytic leukemia of B-cell type not having achieved remission: Secondary | ICD-10-CM | POA: Diagnosis not present

## 2019-05-05 LAB — CBC WITH DIFFERENTIAL/PLATELET
Abs Immature Granulocytes: 0.04 10*3/uL (ref 0.00–0.07)
Basophils Absolute: 0.1 10*3/uL (ref 0.0–0.1)
Basophils Relative: 1 %
Eosinophils Absolute: 0.5 10*3/uL (ref 0.0–0.5)
Eosinophils Relative: 5 %
HCT: 41.8 % (ref 39.0–52.0)
Hemoglobin: 13.4 g/dL (ref 13.0–17.0)
Immature Granulocytes: 0 %
Lymphocytes Relative: 32 %
Lymphs Abs: 3 10*3/uL (ref 0.7–4.0)
MCH: 29.1 pg (ref 26.0–34.0)
MCHC: 32.1 g/dL (ref 30.0–36.0)
MCV: 90.9 fL (ref 80.0–100.0)
Monocytes Absolute: 0.7 10*3/uL (ref 0.1–1.0)
Monocytes Relative: 8 %
Neutro Abs: 5.2 10*3/uL (ref 1.7–7.7)
Neutrophils Relative %: 54 %
Platelets: 227 10*3/uL (ref 150–400)
RBC: 4.6 MIL/uL (ref 4.22–5.81)
RDW: 13.5 % (ref 11.5–15.5)
WBC: 9.5 10*3/uL (ref 4.0–10.5)
nRBC: 0 % (ref 0.0–0.2)

## 2019-05-05 MED ORDER — LIDOCAINE HCL (PF) 1 % IJ SOLN
INTRAMUSCULAR | Status: AC
  Filled 2019-05-05: qty 10

## 2019-05-05 NOTE — Assessment & Plan Note (Signed)
1.  Clinical stage IIIb small lymphocytic lymphoma: - 56 pound weight loss in the last 1 and half year.  No fevers or night sweats. - Right upper quadrant pain for the past 4 months, worse on eating and better on lying on left side. -PET/CT scan on 04/13/2019 showed pathologically enlarged mesenteric and retroperitoneal adenopathy.  SUV ranged from 2.6-3.2.  Left external iliac lymph node 0.9 cm, SUV 2.  1.1 cm left axillary lymph node, SUV 2.1.  Accentuated activity in the left sternoclavicular joint, maximum SUV 5.3, likely secondary to mild arthropathy. - Left axillary lymph node biopsy on 04/27/2019 consistent with small lymphocytic lymphoma. - We discussed his new diagnosis.  He needs treatment based on unexplained weight loss.  I also talked to Dr. Arnoldo Morale about gallbladder issues. - I have recommended bone marrow biopsy.  We talked about the procedure and rare complications including bleeding and infection. -We will also send hepatitis panel.  We will send [place] for del 17 P, and TP 53 mutation status.  We will send IGH V mutation status.  Options include ibrutinib, venetoclax and obinutuzumab. -I will see him back in 2 weeks for follow-up.  2.  Weight loss: -He had 56 pound weight loss in the last 1 and half year. - This could be from combination of lymphoma and likely gallbladder issues. -He tried drinking boost but had diarrhea.  We have recommended drinking boost breeze.

## 2019-05-05 NOTE — Patient Instructions (Signed)
[name] at [facility] Discharge Instructions  You were seen today by Dr. Katragadda. He went over your procedure. He will see you back as scheduled for labs and follow up.   Bone Marrow Aspiration and Bone Marrow Biopsy, Adult, Care After This sheet gives you information about how to care for yourself after your procedure. Your health care provider may also give you more specific instructions. If you have problems or questions, contact your health care provider. What can I expect after the procedure? After the procedure, it is common to have:  Mild pain and tenderness.  Swelling.  Bruising. Follow these instructions at home: Puncture site care      Follow instructions from your health care provider about how to take care of the puncture site. Make sure you: ? Wash your hands with soap and water before you change your bandage (dressing). If soap and water are not available, use hand sanitizer. ? Change your dressing as told by your health care provider.  Check your puncture siteevery day for signs of infection. Check for: ? More redness, swelling, or pain. ? More fluid or blood. ? Warmth. ? Pus or a bad smell. General instructions  Take over-the-counter and prescription medicines only as told by your health care provider.  Do not take baths, swim, or use a hot tub until your health care provider approves. Ask if you can take a shower or have a sponge bath.  Return to your normal activities as told by your health care provider. Ask your health care provider what activities are safe for you.  Do not drive for 24 hours if you were given a medicine to help you relax (sedative) during your procedure.  Keep all follow-up visits as told by your health care provider. This is important. Contact a health care provider if:  Your pain is not controlled with medicine. Get help right away if:  You have a fever.  You have more redness, swelling, or pain  around the puncture site.  You have more fluid or blood coming from the puncture site.  Your puncture site feels warm to the touch.  You have pus or a bad smell coming from the puncture site. These symptoms may represent a serious problem that is an emergency. Do not wait to see if the symptoms will go away. Get medical help right away. Call your local emergency services (911 in the [place] Do not drive yourself to the hospital. Summary  After the procedure, it is common to have mild pain, tenderness, swelling, and bruising.  Follow instructions from your health care provider about how to take care of the puncture site.  Get help right away if you have any symptoms of infection or if you have more blood or fluid coming from the puncture site. This information is not intended to replace advice given to you by your health care provider. Make sure you discuss any questions you have with your health care provider. Document Released: 02/02/2005 Document Revised: 10/29/2017 Document Reviewed: 12/28/2015 Elsevier Patient Education  2020 Elsevier Inc.   Thank you for choosing [facility] at [facility] to provide your oncology and hematology care.  To afford each patient quality time with our provider, please arrive at least 15 minutes before your scheduled appointment time.   If you have a lab appointment with the Cancer Center please come in thru the  Main Entrance and check in at the main information desk  You need   to re-schedule your appointment should you arrive 10 or more minutes late.  We strive to give you quality time with our providers, and arriving late affects you and other patients whose appointments are after yours.  Also, if you no show three or more times for appointments you may be dismissed from the clinic at the providers discretion.     Again, thank you for choosing [facility].  Our hope is that these requests will decrease the amount of time  that you wait before being seen by our physicians.       _____________________________________________________________  Should you have questions after your visit to [facility], please contact our office at [phone] between the hours of 8:00 a.m. and 4:30 p.m.  Voicemails left after 4:00 p.m. will not be returned until the following business day.  For prescription refill requests, have your pharmacy contact our office and allow 72 hours.    Cancer Center Support Programs:   > Cancer Support Group  2nd Tuesday of the month 1pm-2pm, Journey Room    

## 2019-05-05 NOTE — Patient Instructions (Signed)
Laparoscopic Cholecystectomy Laparoscopic cholecystectomy is surgery to remove the gallbladder. The gallbladder is a pear-shaped organ that lies beneath the liver on the right side of the body. The gallbladder stores bile, which is a fluid that helps the body to digest fats. Cholecystectomy is often done for inflammation of the gallbladder (cholecystitis). This condition is usually caused by a buildup of gallstones (cholelithiasis) in the gallbladder. Gallstones can block the flow of bile, which can result in inflammation and pain. In severe cases, emergency surgery may be required. This procedure is done though small incisions in your abdomen (laparoscopic surgery). A thin scope with a camera (laparoscope) is inserted through one incision. Thin surgical instruments are inserted through the other incisions. In some cases, a laparoscopic procedure may be turned into a type of surgery that is done through a larger incision (open surgery). Tell a health care provider about:  Any allergies you have.  All medicines you are taking, including vitamins, herbs, eye drops, creams, and over-the-counter medicines.  Any problems you or family members have had with anesthetic medicines.  Any blood disorders you have.  Any surgeries you have had.  Any medical conditions you have.  Whether you are pregnant or may be pregnant. What are the risks? Generally, this is a safe procedure. However, problems may occur, including:  Infection.  Bleeding.  Allergic reactions to medicines.  Damage to other structures or organs.  A stone remaining in the common bile duct. The common bile duct carries bile from the gallbladder into the small intestine.  A bile leak from the cyst duct that is clipped when your gallbladder is removed. What happens before the procedure?   Medicines  Ask your health care provider about: ? Changing or stopping your regular medicines. This is especially important if you are taking  diabetes medicines or blood thinners. ? Taking medicines such as aspirin and ibuprofen. These medicines can thin your blood. Do not take these medicines before your procedure if your health care provider instructs you not to.  You may be given antibiotic medicine to help prevent infection. General instructions  Let your health care provider know if you develop a cold or an infection before surgery.  Plan to have someone take you home from the hospital or clinic.  Ask your health care provider how your surgical site will be marked or identified. What happens during the procedure?   To reduce your risk of infection: ? Your health care team will wash or sanitize their hands. ? Your skin will be washed with soap. ? Hair may be removed from the surgical area.  An IV tube may be inserted into one of your veins.  You will be given one or more of the following: ? A medicine to help you relax (sedative). ? A medicine to make you fall asleep (general anesthetic).  A breathing tube will be placed in your mouth.  Your surgeon will make several small cuts (incisions) in your abdomen.  The laparoscope will be inserted through one of the small incisions. The camera on the laparoscope will send images to a TV screen (monitor) in the operating room. This lets your surgeon see inside your abdomen.  Air-like gas will be pumped into your abdomen. This will expand your abdomen to give the surgeon more room to perform the surgery.  Other tools that are needed for the procedure will be inserted through the other incisions. The gallbladder will be removed through one of the incisions.  Your common bile duct   may be examined. If stones are found in the common bile duct, they may be removed.  After your gallbladder has been removed, the incisions will be closed with stitches (sutures), staples, or skin glue.  Your incisions may be covered with a bandage (dressing). The procedure may vary among health  care providers and hospitals. What happens after the procedure?  Your blood pressure, heart rate, breathing rate, and blood oxygen level will be monitored until the medicines you were given have worn off.  You will be given medicines as needed to control your pain.  Do not drive for 24 hours if you were given a sedative. This information is not intended to replace advice given to you by your health care provider. Make sure you discuss any questions you have with your health care provider. Document Released: 07/16/2005 Document Revised: 06/28/2017 Document Reviewed: 01/02/2016 Elsevier Patient Education  2020 Elsevier Inc.  

## 2019-05-05 NOTE — Progress Notes (Signed)
[place], [place] [address]   CLINIC:  Medical Oncology/Hematology  PCP:  Orpah Greek, MD [address], [address] [place] [address] [phone]   REASON FOR VISIT:  Small lymphocytic lymphoma.  CURRENT THERAPY: Work-up in progress.    INTERVAL HISTORY:  [name] 58 y.o. male seen for follow-up of lymphoma.  Continues to have right upper quadrant pain, worse on eating.  Pain gets better on lying on the left side.  Denies any fevers or night sweats but has significant weight loss.    REVIEW OF SYSTEMS:  Review of Systems  Gastrointestinal: Positive for abdominal pain.  All other systems reviewed and are negative.    PAST MEDICAL/SURGICAL HISTORY:  Past Medical History:  Diagnosis Date  . CAD (coronary artery disease)   . GERD (gastroesophageal reflux disease)   . HTN (hypertension)   . Hypercholesterolemia   . MI (myocardial infarction) ([place]) 2003   Past Surgical History:  Procedure Laterality Date  . AXILLARY LYMPH NODE BIOPSY Left 04/27/2019   Procedure: AXILLARY LYMPH NODE BIOPSY;  Surgeon: Aviva Signs, MD;  Location: [facility] ORS;  Service: General;  Laterality: Left;  . BIOPSY  11/26/2018   Procedure: BIOPSY;  Surgeon: Daneil Dolin, MD;  Location: [facility] ENDO SUITE;  Service: Endoscopy;;  . CORONARY ANGIOPLASTY WITH STENT PLACEMENT     2003  . ESOPHAGOGASTRODUODENOSCOPY N/A 11/26/2018   mild erosive reflux esophagitis, gastric erythema. Negative H.pylori   . INGUINAL HERNIA REPAIR Right 04/27/2019   Procedure: HERNIA REPAIR INGUINAL ADULT;  Surgeon: Aviva Signs, MD;  Location: [facility] ORS;  Service: General;  Laterality: Right;  . KNEE SURGERY Right   . lumbar fusion with cage  07/2017  . NASAL SINUS SURGERY    . TONSILLECTOMY AND ADENOIDECTOMY       SOCIAL HISTORY:  Social History   Socioeconomic History  . Marital status: Divorced    Spouse name: Not on file  . Number of children: 2  . Years of education: Not  on file  . Highest education level: Not on file  Occupational History  . Occupation: employed    Comment: part time at [employer]  . Financial resource strain: Not hard at all  . Food insecurity    Worry: Never true    Inability: Never true  . Transportation needs    Medical: No    Non-medical: No  Tobacco Use  . Smoking status: Current Every Day Smoker    Packs/day: 0.50    Years: 25.00    Pack years: 12.50    Types: Cigarettes  . Smokeless tobacco: Never Used  Substance and Sexual Activity  . Alcohol use: Yes    Comment: occ  . Drug use: Never  . Sexual activity: Yes  Lifestyle  . Physical activity    Days per week: 4 days    Minutes per session: 60 min  . Stress: Not at all  Relationships  . Social connections    Talks on phone: More than three times a week    Gets together: More than three times a week    Attends religious service: More than 4 times per year    Active member of club or organization: Yes    Attends meetings of clubs or organizations: 1 to 4 times per year    Relationship status: Divorced  . Intimate partner violence    Fear of current or ex partner: No    Emotionally  abused: No    Physically abused: No    Forced sexual activity: No  Other Topics Concern  . Not on file  Social History Narrative  . Not on file    FAMILY HISTORY:  Family History  Problem Relation Age of Onset  . Colon polyps Father        thinks he may have had polyps, possibly in his 27s.   [name] Heart attack Father   . Dementia Mother   . Hypotension Mother   . Stroke Sister   . Diabetes Maternal Grandmother   . Stroke Maternal Grandfather   . Cancer Paternal Grandmother   . Congestive Heart Failure Paternal Grandfather   . Kidney cancer Daughter   . Colon cancer Neg Hx   . Pancreatic cancer Neg Hx   . Liver disease Neg Hx     CURRENT MEDICATIONS:  Outpatient Encounter Medications as of 05/05/2019  Medication Sig Note  . baclofen (LIORESAL) 10 MG  tablet Take 10 mg by mouth 2 (two) times daily as needed for muscle spasms.    . cimetidine (TAGAMET) 200 MG tablet Take 200 mg by mouth 2 (two) times daily.   . clopidogrel (PLAVIX) 75 MG tablet Take 75 mg by mouth daily.  04/23/2019: On hold for surgery, last dose was 04/21/2019.  [name] escitalopram (LEXAPRO) 20 MG tablet Take 20 mg by mouth daily.    [name] gabapentin (NEURONTIN) 100 MG capsule Take 100 mg by mouth 2 (two) times daily as needed (pain).    [name] lisinopril (PRINIVIL,ZESTRIL) 10 MG tablet Take 10 mg by mouth daily.    . naproxen sodium (ALEVE) 220 MG tablet Take 220-440 mg by mouth 2 (two) times daily as needed (pain).    [name] oxyCODONE-acetaminophen (PERCOCET) 7.5-325 MG tablet Take 1 tablet by mouth every 6 (six) hours as needed.   . pantoprazole (PROTONIX) 40 MG tablet Take 40 mg by mouth 2 (two) times daily.   . simvastatin (ZOCOR) 40 MG tablet Take 40 mg by mouth every other day.     No facility-administered encounter medications on file as of 05/05/2019.     ALLERGIES:  No Known Allergies   PHYSICAL EXAM:  ECOG Performance status: 1  Vitals:   05/05/19 0741 05/05/19 0830  BP: (!) 141/86 (!) 146/98  Pulse: 60 (!) 58  Resp: 18 18  Temp: 97.7 F (36.5 C)   SpO2: 100% 100%   Filed Weights   05/05/19 0741  Weight: 185 lb 14.4 oz (84.3 kg)    Physical Exam Vitals signs reviewed.  Constitutional:      Appearance: Normal appearance.  Cardiovascular:     Rate and Rhythm: Normal rate and regular rhythm.     Heart sounds: Normal heart sounds.  Pulmonary:     Effort: Pulmonary effort is normal.     Breath sounds: Normal breath sounds.  Abdominal:     General: There is no distension.     Palpations: Abdomen is soft. There is no mass.  Musculoskeletal:        General: No swelling.  Skin:    General: Skin is warm.  Neurological:     General: No focal deficit present.     Mental Status: He is alert and oriented to person, place, and time.  Psychiatric:        Mood and  Affect: Mood normal.        Behavior: Behavior normal.    Swelling at the left axillary lymph node biopsy site with no  erythema or tenderness.  LABORATORY DATA:  I have reviewed the labs as listed.  CBC    Component Value Date/Time   WBC 9.5 05/05/2019 0739   RBC 4.60 05/05/2019 0739   HGB 13.4 05/05/2019 0739   HCT 41.8 05/05/2019 0739   PLT 227 05/05/2019 0739   MCV 90.9 05/05/2019 0739   MCH 29.1 05/05/2019 0739   MCHC 32.1 05/05/2019 0739   RDW 13.5 05/05/2019 0739   LYMPHSABS 3.0 05/05/2019 0739   MONOABS 0.7 05/05/2019 0739   EOSABS 0.5 05/05/2019 0739   BASOSABS 0.1 05/05/2019 0739   CMP Latest Ref Rng & Units 03/12/2019  Glucose 70 - 99 mg/dL 75  BUN 6 - 20 mg/dL 17  Creatinine 0.61 - 1.24 mg/dL 0.80  Sodium 135 - 145 mmol/L 136  Potassium 3.5 - 5.1 mmol/L 3.8  Chloride 98 - 111 mmol/L 105  CO2 22 - 32 mmol/L 24  Calcium 8.9 - 10.3 mg/dL 8.6(L)  Total Protein 6.5 - 8.1 g/dL 6.4(L)  Total Bilirubin 0.3 - 1.2 mg/dL 0.9  Alkaline Phos 38 - 126 U/L 52  AST 15 - 41 U/L 15  ALT 0 - 44 U/L 11       DIAGNOSTIC IMAGING:  I have independently reviewed the scans and discussed with the patient.    ASSESSMENT & PLAN:   Small lymphocytic lymphoma ([place]) 1.  Clinical stage IIIb small lymphocytic lymphoma: - 56 pound weight loss in the last 1 and half year.  No fevers or night sweats. - Right upper quadrant pain for the past 4 months, worse on eating and better on lying on left side. -PET/CT scan on 04/13/2019 showed pathologically enlarged mesenteric and retroperitoneal adenopathy.  SUV ranged from 2.6-3.2.  Left external iliac lymph node 0.9 cm, SUV 2.  1.1 cm left axillary lymph node, SUV 2.1.  Accentuated activity in the left sternoclavicular joint, maximum SUV 5.3, likely secondary to mild arthropathy. - Left axillary lymph node biopsy on 04/27/2019 consistent with small lymphocytic lymphoma. - We discussed his new diagnosis.  He needs treatment based on unexplained  weight loss.  I also talked to Dr. Arnoldo Morale about gallbladder issues. - I have recommended bone marrow biopsy.  We talked about the procedure and rare complications including bleeding and infection. -We will also send hepatitis panel.  We will send [place] for del 17 P, and TP 53 mutation status.  We will send IGH V mutation status.  Options include ibrutinib, venetoclax and obinutuzumab. -I will see him back in 2 weeks for follow-up.  2.  Weight loss: -He had 56 pound weight loss in the last 1 and half year. - This could be from combination of lymphoma and likely gallbladder issues. -He tried drinking boost but had diarrhea.  We have recommended drinking boost breeze.   Total time spent is 25 minutes with more than 50% of the time spent face-to-face discussing plan of further work-up, counseling and coordination of care.  Orders placed this encounter:  No orders of the defined types were placed in this encounter.     Derek Jack, MD [place] [phone]

## 2019-05-05 NOTE — Progress Notes (Signed)
Patient here today for bone marrow biopsy. Procedure explained and consent signed by all parties at [place]. Patient placed in prone position with both arms above head. Time out conducted at 0756 all parties agreed. Procedure started at 0800. Patient tolerated procedure well with minimal pain and discomfort. Specimens collected and labeled appropriately. Procedure completed at [place]. Dressing applied and patient reposition on back, sitting up, resting at 0827. Specimens taken to lab for processing. Patient stable and discharged home ambulatory.

## 2019-05-05 NOTE — Progress Notes (Signed)
INDICATION:    Bone Marrow Biopsy and Aspiration Procedure Note   The patient was identified by name and date of birth, prior to start of the procedure and a timeout was performed.   An informed consent was obtained after discussing potential risks including bleeding, infection and pain.  The right posterior iliac crest was palpated, cleaned with ChloraPrep, and drapes applied.  1% lidocaine is infiltrated into the skin, subcutaneous tissue and periosteum.  Bone marrow was aspirated and smears made.  With the help of Jamshidi needle a core biopsy was obtained.  Pressure was applied to the biopsy site and bandage was placed over the biopsy site. Patient was made to lie on the back for 15 mins prior to discharge.  The procedure was tolerated well. COMPLICATIONS: None BLOOD LOSS: none Patient was discharged home in stable condition to return in 2 weeks to review results.  Patient was provided with post bone marrow biopsy instructions and instructed to call if there was any bleeding or worsening pain.  Specimens sent for flow cytometry, cytogenetics and additional studies.  Signed Derek Jack, MD

## 2019-05-06 ENCOUNTER — Ambulatory Visit (HOSPITAL_COMMUNITY): Payer: 59 | Admitting: Hematology

## 2019-05-06 LAB — SURGICAL PATHOLOGY

## 2019-05-06 NOTE — Progress Notes (Signed)
Subjective:     Patrick Rubio  Here for postoperative visit.  Patient did have swelling in the left axilla over this past weekend, but has since started to resolve.  He denies any fever or chills.  No drainage has been noted.  He is still having intermittent right upper quadrant abdominal pain.  This has been present for some time. Objective:    BP 111/77 (BP Location: Left Arm, Patient Position: Sitting, Cuff Size: Normal)   Pulse 66   Temp (!) 97.5 F (36.4 C) (Tympanic)   Resp 16   Ht 5\' 10"  (1.778 m)   Wt 187 lb (84.8 kg)   SpO2 98%   BMI 26.83 kg/m   General:  alert, cooperative and no distress  Left axillary incision healing well.  Mild swelling present but no ecchymosis.  No erythema. Final pathology has already been reviewed with the patient by oncology.     Assessment:    Resolving seroma, left axilla   Chronic cholecystitis   Plan:   Patient is scheduled for laparoscopic cholecystectomy on 05/18/2019.  The risks and benefits of the procedure including bleeding, infection, hepatobiliary injury, and the possibility of an open procedure were fully explained to the patient, who gave informed consent.

## 2019-05-06 NOTE — H&P (Signed)
Patrick Rubio; [id number]; [birth year]   HPI Patient is a 58 year old white male who was referred to my care by Patrick Rubio and Patrick Rubio for a lymph node biopsy.  A left axillary lymph node biopsy as well as a right inguinal herniorrhaphy were recently performed.  He tolerated both procedures well.  He has been having biliary colic secondary to chronic cholecystitis for many months.  He initially was seen for this, but work-up revealed the lymphoma.  He continues to have right upper quadrant abdominal pain with fatty food intolerance.  No fever, chills, jaundice have been noted. Past Medical History:  Diagnosis Date  . CAD (coronary artery disease)   . GERD (gastroesophageal reflux disease)   . HTN (hypertension)   . Hypercholesterolemia   . MI (myocardial infarction) [facility])     Past Surgical History:  Procedure Laterality Date  . BIOPSY  11/26/2018   Procedure: BIOPSY;  Surgeon: Daneil Dolin, MD;  Location: [facility];  Service: Endoscopy;;  . CORONARY ANGIOPLASTY WITH STENT PLACEMENT     2003  . ESOPHAGOGASTRODUODENOSCOPY N/A 11/26/2018   mild erosive reflux esophagitis, gastric erythema. Negative H.pylori   . KNEE SURGERY    . lumbar fusion with cage  07/2017  . NASAL SINUS SURGERY    . TONSILLECTOMY AND ADENOIDECTOMY      Family History  Problem Relation Age of Onset  . Colon polyps Father        thinks he may have had polyps, possibly in his 46s.   [name] Heart attack Father   . Dementia Mother   . Hypotension Mother   . Stroke Sister   . Diabetes Maternal Grandmother   . Stroke Maternal Grandfather   . Cancer Paternal Grandmother   . Congestive Heart Failure Paternal Grandfather   . Kidney cancer Daughter   . Colon cancer Neg Hx   . Pancreatic cancer Neg Hx   . Liver disease Neg Hx     Current Outpatient Medications on File Prior to Visit  Medication Sig Dispense Refill  . aspirin EC 81 MG tablet Take 81 mg by mouth every Monday, Wednesday, and Friday.     .  baclofen (LIORESAL) 10 MG tablet Take 10 mg by mouth 2 (two) times daily as needed for muscle spasms.     . cimetidine (TAGAMET) 200 MG tablet Take 200 mg by mouth 2 (two) times daily.    . clopidogrel (PLAVIX) 75 MG tablet Take 75 mg by mouth daily.     [name] escitalopram (LEXAPRO) 20 MG tablet Take 20 mg by mouth daily.     [name] gabapentin (NEURONTIN) 100 MG capsule Take 100 mg by mouth 2 (two) times daily as needed (pain).     [name] lisinopril (PRINIVIL,ZESTRIL) 10 MG tablet Take 10 mg by mouth daily.     . naproxen sodium (ALEVE) 220 MG tablet Take 220-440 mg by mouth 2 (two) times daily as needed (pain).     . pantoprazole (PROTONIX) 40 MG tablet Take 40 mg by mouth 2 (two) times daily.    [name] SEMAGLUTIDE, 1 MG/DOSE, [place] Inject 3 mg into the skin every Thursday. Receives this medication from [facility] with Dr. Orpah Greek as part of a research trial (3mg  dose is not commercially available)    . simvastatin (ZOCOR) 40 MG tablet Take 40 mg by mouth every other day.      No current facility-administered medications on file prior to visit.     No Known Allergies  Social  History   Substance and Sexual Activity  Alcohol Use Yes   Comment: occ    Social History   Tobacco Use  Smoking Status Current Every Day Smoker  . Packs/day: 0.50  . Types: Cigarettes  Smokeless Tobacco Never Used    Review of Systems  Constitutional: Negative.   HENT: Positive for sinus pain.   Eyes: Negative.   Respiratory: Negative.   Cardiovascular: Negative.   Gastrointestinal: Positive for abdominal pain, heartburn and nausea.  Genitourinary: Negative.   Musculoskeletal: Positive for back pain and joint pain.  Skin: Negative.   Neurological: Negative.   Endo/Heme/Allergies: Negative.   Psychiatric/Behavioral: Negative.     Objective   Vitals:   04/21/19 1321  BP: 113/74  Pulse: 63  Resp: 16  Temp: (!) 97.5 F (36.4 C)  SpO2: 97%    Physical Exam Vitals signs reviewed.   Constitutional:      Appearance: Normal appearance. He is not ill-appearing.  HENT:     Head: Normocephalic and atraumatic.  Cardiovascular:     Rate and Rhythm: Normal rate and regular rhythm.     Heart sounds: Normal heart sounds. No murmur. No friction rub. No gallop.   Pulmonary:     Effort: Pulmonary effort is normal. No respiratory distress.     Breath sounds: Normal breath sounds. No stridor. No wheezing, rhonchi or rales.  Abdominal:     General: Bowel sounds are normal. There is no distension.     Palpations: Abdomen is soft. There is no mass.     Tenderness: There is no abdominal tenderness. There is no guarding or rebound.     Hernia:     Comments: Well-healed right inguinal surgical scar. Genitourinary:    Comments: Genitourinary examination is within normal limits. Skin:    General: Skin is warm and dry.  Neurological:     Mental Status: He is alert and oriented to person, place, and time.    Assessment  Chronic cholecystitis, known history of lymphoma Plan   Patient is scheduled for laparoscopic cholecystectomy on 05/18/2019.  The risks and benefits of the procedure including bleeding, infection, hepatobiliary injury, and the possibility of an open procedure were fully explained to the patient, who gave informed consent.

## 2019-05-07 ENCOUNTER — Encounter (HOSPITAL_COMMUNITY): Payer: Self-pay | Admitting: *Deleted

## 2019-05-07 NOTE — Progress Notes (Signed)
I emailed flow cytometry lab [facility] and [facility]) and requested IGVH mutation, TP53 mutation and CLL FISH panel from the bone marrow done on 05/05/19.  I have asked that they let me know if it can not be done and I will send it from his biopsy.

## 2019-05-12 ENCOUNTER — Other Ambulatory Visit (HOSPITAL_COMMUNITY): Payer: Self-pay | Admitting: *Deleted

## 2019-05-12 ENCOUNTER — Encounter (HOSPITAL_COMMUNITY): Payer: Self-pay | Admitting: Hematology

## 2019-05-12 ENCOUNTER — Encounter (HOSPITAL_COMMUNITY): Payer: Self-pay | Admitting: *Deleted

## 2019-05-12 DIAGNOSIS — C8300 Small cell B-cell lymphoma, unspecified site: Secondary | ICD-10-CM

## 2019-05-12 NOTE — Progress Notes (Signed)
Orders placed for IGVH mutation testing.  Patient aware of appointment date and time.

## 2019-05-13 ENCOUNTER — Encounter (HOSPITAL_COMMUNITY): Payer: Self-pay | Admitting: Hematology

## 2019-05-14 ENCOUNTER — Other Ambulatory Visit: Payer: Self-pay

## 2019-05-14 ENCOUNTER — Encounter (HOSPITAL_COMMUNITY): Payer: Self-pay

## 2019-05-15 ENCOUNTER — Inpatient Hospital Stay (HOSPITAL_COMMUNITY): Payer: 59

## 2019-05-15 ENCOUNTER — Other Ambulatory Visit (HOSPITAL_COMMUNITY)
Admission: RE | Admit: 2019-05-15 | Discharge: 2019-05-15 | Disposition: A | Discharge status: Home / Self Care | Payer: 59 | Source: Ambulatory Visit | Attending: General Surgery | Admitting: General Surgery

## 2019-05-15 ENCOUNTER — Encounter (HOSPITAL_COMMUNITY): Payer: Self-pay | Admitting: Hematology

## 2019-05-15 ENCOUNTER — Encounter (HOSPITAL_COMMUNITY)
Admission: RE | Admit: 2019-05-15 | Discharge: 2019-05-15 | Disposition: A | Discharge status: Home / Self Care | Payer: 59 | Source: Ambulatory Visit | Attending: General Surgery | Admitting: General Surgery

## 2019-05-15 DIAGNOSIS — Z20828 Contact with and (suspected) exposure to other viral communicable diseases: Secondary | ICD-10-CM | POA: Insufficient documentation

## 2019-05-15 DIAGNOSIS — C8300 Small cell B-cell lymphoma, unspecified site: Secondary | ICD-10-CM

## 2019-05-15 DIAGNOSIS — C9110 Chronic lymphocytic leukemia of B-cell type not having achieved remission: Secondary | ICD-10-CM | POA: Diagnosis not present

## 2019-05-15 LAB — SARS CORONAVIRUS 2 (TAT 6-24 HRS): SARS Coronavirus 2: NEGATIVE

## 2019-05-18 ENCOUNTER — Ambulatory Visit (HOSPITAL_COMMUNITY)
Admission: RE | Admit: 2019-05-18 | Discharge: 2019-05-18 | Disposition: A | Discharge status: Home / Self Care | Payer: 59 | Attending: General Surgery | Admitting: General Surgery

## 2019-05-18 ENCOUNTER — Ambulatory Visit (HOSPITAL_COMMUNITY): Payer: 59 | Admitting: Anesthesiology

## 2019-05-18 ENCOUNTER — Other Ambulatory Visit: Payer: Self-pay

## 2019-05-18 ENCOUNTER — Encounter (HOSPITAL_COMMUNITY): Payer: Self-pay | Admitting: *Deleted

## 2019-05-18 ENCOUNTER — Encounter (HOSPITAL_COMMUNITY)
Admission: RE | Disposition: A | Discharge status: Home / Self Care | Payer: Self-pay | Source: Home / Self Care | Attending: General Surgery

## 2019-05-18 DIAGNOSIS — I10 Essential (primary) hypertension: Secondary | ICD-10-CM | POA: Diagnosis not present

## 2019-05-18 DIAGNOSIS — K819 Cholecystitis, unspecified: Secondary | ICD-10-CM | POA: Insufficient documentation

## 2019-05-18 DIAGNOSIS — I252 Old myocardial infarction: Secondary | ICD-10-CM | POA: Diagnosis not present

## 2019-05-18 DIAGNOSIS — F17210 Nicotine dependence, cigarettes, uncomplicated: Secondary | ICD-10-CM | POA: Diagnosis not present

## 2019-05-18 DIAGNOSIS — Z7902 Long term (current) use of antithrombotics/antiplatelets: Secondary | ICD-10-CM | POA: Insufficient documentation

## 2019-05-18 DIAGNOSIS — Z7982 Long term (current) use of aspirin: Secondary | ICD-10-CM | POA: Diagnosis not present

## 2019-05-18 DIAGNOSIS — K219 Gastro-esophageal reflux disease without esophagitis: Secondary | ICD-10-CM | POA: Diagnosis not present

## 2019-05-18 DIAGNOSIS — C8590 Non-Hodgkin lymphoma, unspecified, unspecified site: Secondary | ICD-10-CM | POA: Diagnosis not present

## 2019-05-18 DIAGNOSIS — Z8249 Family history of ischemic heart disease and other diseases of the circulatory system: Secondary | ICD-10-CM | POA: Diagnosis not present

## 2019-05-18 DIAGNOSIS — E7800 Pure hypercholesterolemia, unspecified: Secondary | ICD-10-CM | POA: Diagnosis not present

## 2019-05-18 DIAGNOSIS — Z79899 Other long term (current) drug therapy: Secondary | ICD-10-CM | POA: Insufficient documentation

## 2019-05-18 DIAGNOSIS — K8020 Calculus of gallbladder without cholecystitis without obstruction: Secondary | ICD-10-CM

## 2019-05-18 DIAGNOSIS — Z955 Presence of coronary angioplasty implant and graft: Secondary | ICD-10-CM | POA: Diagnosis not present

## 2019-05-18 DIAGNOSIS — I2510 Atherosclerotic heart disease of native coronary artery without angina pectoris: Secondary | ICD-10-CM | POA: Insufficient documentation

## 2019-05-18 HISTORY — PX: CHOLECYSTECTOMY: SHX55

## 2019-05-18 SURGERY — LAPAROSCOPIC CHOLECYSTECTOMY
Anesthesia: General | Site: Abdomen

## 2019-05-18 MED ORDER — HEMOSTATIC AGENTS (NO CHARGE) OPTIME
TOPICAL | Status: DC | PRN
  Administered 2019-05-18: 1 via TOPICAL

## 2019-05-18 MED ORDER — BUPIVACAINE LIPOSOME 1.3 % IJ SUSP
INTRAMUSCULAR | Status: DC | PRN
  Administered 2019-05-18: 20 mL

## 2019-05-18 MED ORDER — SUGAMMADEX SODIUM 500 MG/5ML IV SOLN
INTRAVENOUS | Status: AC
  Filled 2019-05-18: qty 5

## 2019-05-18 MED ORDER — HYDROMORPHONE HCL 1 MG/ML IJ SOLN
0.2500 mg | INTRAMUSCULAR | Status: DC | PRN
  Administered 2019-05-18: 13:00:00 0.5 mg via INTRAVENOUS
  Filled 2019-05-18: qty 0.5

## 2019-05-18 MED ORDER — ONDANSETRON HCL 4 MG/2ML IJ SOLN
INTRAMUSCULAR | Status: AC
  Filled 2019-05-18: qty 2

## 2019-05-18 MED ORDER — ROCURONIUM 10MG/ML (10ML) SYRINGE FOR MEDFUSION PUMP - OPTIME
INTRAVENOUS | Status: DC | PRN
  Administered 2019-05-18: 35 mg via INTRAVENOUS

## 2019-05-18 MED ORDER — CHLORHEXIDINE GLUCONATE CLOTH 2 % EX PADS
6.0000 | MEDICATED_PAD | Freq: Once | CUTANEOUS | Status: AC
  Administered 2019-05-18: 6 via TOPICAL

## 2019-05-18 MED ORDER — CIPROFLOXACIN IN D5W 400 MG/200ML IV SOLN
INTRAVENOUS | Status: AC
  Filled 2019-05-18: qty 200

## 2019-05-18 MED ORDER — LIDOCAINE 2% (20 MG/ML) 5 ML SYRINGE
INTRAMUSCULAR | Status: AC
  Filled 2019-05-18: qty 10

## 2019-05-18 MED ORDER — BUPIVACAINE LIPOSOME 1.3 % IJ SUSP
INTRAMUSCULAR | Status: AC
  Filled 2019-05-18: qty 20

## 2019-05-18 MED ORDER — MIDAZOLAM HCL 2 MG/2ML IJ SOLN
INTRAMUSCULAR | Status: AC
  Filled 2019-05-18: qty 2

## 2019-05-18 MED ORDER — PROPOFOL 10 MG/ML IV BOLUS
INTRAVENOUS | Status: DC | PRN
  Administered 2019-05-18: 200 mg via INTRAVENOUS

## 2019-05-18 MED ORDER — KETOROLAC TROMETHAMINE 30 MG/ML IJ SOLN
30.0000 mg | Freq: Once | INTRAMUSCULAR | Status: AC
  Administered 2019-05-18: 13:00:00 30 mg via INTRAVENOUS
  Filled 2019-05-18: qty 1

## 2019-05-18 MED ORDER — SUCCINYLCHOLINE 20MG/ML (10ML) SYRINGE FOR MEDFUSION PUMP - OPTIME
INTRAMUSCULAR | Status: DC | PRN
  Administered 2019-05-18: 120 mg via INTRAVENOUS

## 2019-05-18 MED ORDER — HYDROCODONE-ACETAMINOPHEN 7.5-325 MG PO TABS: 1.0000 | ORAL_TABLET | Freq: Once | ORAL | Status: DC | PRN

## 2019-05-18 MED ORDER — PROMETHAZINE HCL 25 MG/ML IJ SOLN: 6.2500 mg | INTRAMUSCULAR | Status: DC | PRN

## 2019-05-18 MED ORDER — SODIUM CHLORIDE 0.9 % IR SOLN
Status: DC | PRN
  Administered 2019-05-18: 1

## 2019-05-18 MED ORDER — MIDAZOLAM HCL 2 MG/2ML IJ SOLN: 0.5000 mg | Freq: Once | INTRAMUSCULAR | Status: DC | PRN

## 2019-05-18 MED ORDER — SUGAMMADEX SODIUM 200 MG/2ML IV SOLN
INTRAVENOUS | Status: DC | PRN
  Administered 2019-05-18: 340 mg via INTRAVENOUS

## 2019-05-18 MED ORDER — HYDRALAZINE HCL 20 MG/ML IJ SOLN
INTRAMUSCULAR | Status: AC
  Filled 2019-05-18: qty 1

## 2019-05-18 MED ORDER — CHLORHEXIDINE GLUCONATE CLOTH 2 % EX PADS: 6.0000 | MEDICATED_PAD | Freq: Once | CUTANEOUS | Status: DC

## 2019-05-18 MED ORDER — HYDRALAZINE HCL 20 MG/ML IJ SOLN
10.0000 mg | Freq: Four times a day (QID) | INTRAMUSCULAR | Status: DC | PRN
  Administered 2019-05-18: 10 mg via INTRAVENOUS

## 2019-05-18 MED ORDER — SUCCINYLCHOLINE CHLORIDE 200 MG/10ML IV SOSY
PREFILLED_SYRINGE | INTRAVENOUS | Status: AC
  Filled 2019-05-18: qty 10

## 2019-05-18 MED ORDER — FENTANYL CITRATE (PF) 100 MCG/2ML IJ SOLN
INTRAMUSCULAR | Status: AC
  Filled 2019-05-18: qty 2

## 2019-05-18 MED ORDER — LACTATED RINGERS IV SOLN
INTRAVENOUS | Status: DC
  Administered 2019-05-18: 09:00:00 via INTRAVENOUS

## 2019-05-18 MED ORDER — ROCURONIUM BROMIDE 10 MG/ML (PF) SYRINGE
PREFILLED_SYRINGE | INTRAVENOUS | Status: AC
  Filled 2019-05-18: qty 10

## 2019-05-18 MED ORDER — FENTANYL CITRATE (PF) 100 MCG/2ML IJ SOLN
INTRAMUSCULAR | Status: DC | PRN
  Administered 2019-05-18 (×4): 50 ug via INTRAVENOUS

## 2019-05-18 MED ORDER — FENTANYL CITRATE (PF) 250 MCG/5ML IJ SOLN
INTRAMUSCULAR | Status: AC
  Filled 2019-05-18: qty 5

## 2019-05-18 MED ORDER — CIPROFLOXACIN IN D5W 400 MG/200ML IV SOLN
400.0000 mg | INTRAVENOUS | Status: AC
  Administered 2019-05-18: 12:00:00 400 mg via INTRAVENOUS

## 2019-05-18 MED ORDER — MIDAZOLAM HCL 5 MG/5ML IJ SOLN
INTRAMUSCULAR | Status: DC | PRN
  Administered 2019-05-18: 2 mg via INTRAVENOUS

## 2019-05-18 MED ORDER — ONDANSETRON HCL 4 MG/2ML IJ SOLN
INTRAMUSCULAR | Status: DC | PRN
  Administered 2019-05-18: 4 mg via INTRAVENOUS

## 2019-05-18 SURGICAL SUPPLY — 43 items
APPLIER CLIP ROT 10 11.4 M/L (STAPLE) ×3
BAG RETRIEVAL 10 (BASKET) ×1
BAG RETRIEVAL 10MM (BASKET) ×1
CHLORAPREP W/TINT 26 (MISCELLANEOUS) ×3 IMPLANT
CLIP APPLIE ROT 10 11.4 M/L (STAPLE) ×1 IMPLANT
CLOTH BEACON ORANGE TIMEOUT ST (SAFETY) ×3 IMPLANT
COVER LIGHT HANDLE STERIS (MISCELLANEOUS) ×6 IMPLANT
COVER WAND RF STERILE (DRAPES) ×3 IMPLANT
DERMABOND ADVANCED (GAUZE/BANDAGES/DRESSINGS) ×2
DERMABOND ADVANCED .7 DNX12 (GAUZE/BANDAGES/DRESSINGS) ×1 IMPLANT
ELECT REM PT RETURN 9FT ADLT (ELECTROSURGICAL) ×3
ELECTRODE REM PT RTRN 9FT ADLT (ELECTROSURGICAL) ×1 IMPLANT
FILTER SMOKE EVAC LAPAROSHD (FILTER) ×3 IMPLANT
GLOVE BIOGEL PI IND STRL 7.0 (GLOVE) ×2 IMPLANT
GLOVE BIOGEL PI INDICATOR 7.0 (GLOVE) ×4
GLOVE SURG SS PI 7.5 STRL IVOR (GLOVE) ×3 IMPLANT
GOWN STRL REUS W/TWL LRG LVL3 (GOWN DISPOSABLE) ×9 IMPLANT
HEMOSTAT SNOW SURGICEL 2X4 (HEMOSTASIS) ×3 IMPLANT
INST SET LAPROSCOPIC AP (KITS) ×3 IMPLANT
IV NS IRRIG 3000ML ARTHROMATIC (IV SOLUTION) IMPLANT
KIT TURNOVER KIT A (KITS) ×3 IMPLANT
MANIFOLD NEPTUNE II (INSTRUMENTS) ×3 IMPLANT
NEEDLE HYPO 18GX1.5 BLUNT FILL (NEEDLE) ×3 IMPLANT
NEEDLE HYPO 22GX1.5 SAFETY (NEEDLE) ×3 IMPLANT
NEEDLE INSUFFLATION 14GA 120MM (NEEDLE) ×3 IMPLANT
NS IRRIG 1000ML POUR BTL (IV SOLUTION) ×3 IMPLANT
PACK LAP CHOLE LZT030E (CUSTOM PROCEDURE TRAY) ×3 IMPLANT
PAD ARMBOARD 7.5X6 YLW CONV (MISCELLANEOUS) ×3 IMPLANT
SET BASIN LINEN APH (SET/KITS/TRAYS/PACK) ×3 IMPLANT
SET TUBE IRRIG SUCTION NO TIP (IRRIGATION / IRRIGATOR) IMPLANT
SET TUBE SMOKE EVAC HIGH FLOW (TUBING) ×3 IMPLANT
SLEEVE ENDOPATH XCEL 5M (ENDOMECHANICALS) ×3 IMPLANT
SUT MNCRL AB 4-0 PS2 18 (SUTURE) ×6 IMPLANT
SUT VICRYL 0 UR6 27IN ABS (SUTURE) ×3 IMPLANT
SYR 20ML LL LF (SYRINGE) ×6 IMPLANT
SYS BAG RETRIEVAL 10MM (BASKET) ×1
SYSTEM BAG RETRIEVAL 10MM (BASKET) ×1 IMPLANT
TROCAR ENDO BLADELESS 11MM (ENDOMECHANICALS) ×3 IMPLANT
TROCAR XCEL NON-BLD 5MMX100MML (ENDOMECHANICALS) ×3 IMPLANT
TROCAR XCEL UNIV SLVE 11M 100M (ENDOMECHANICALS) ×3 IMPLANT
TUBE CONNECTING 12'X1/4 (SUCTIONS) ×1
TUBE CONNECTING 12X1/4 (SUCTIONS) ×2 IMPLANT
WARMER LAPAROSCOPE (MISCELLANEOUS) ×3 IMPLANT

## 2019-05-18 NOTE — Anesthesia Postprocedure Evaluation (Signed)
Anesthesia Post Note Late entry for 1400  Patient: Patrick Rubio  Procedure(s) Performed: LAPAROSCOPIC CHOLECYSTECTOMY (N/A Abdomen)  Patient location during evaluation: Phase II Anesthesia Type: General Level of consciousness: awake and alert, oriented and patient cooperative Pain management: pain level controlled Vital Signs Assessment: post-procedure vital signs reviewed and stable Respiratory status: spontaneous breathing Cardiovascular status: stable Postop Assessment: no apparent nausea or vomiting Anesthetic complications: no     Last Vitals:  Vitals:   05/18/19 1331 05/18/19 1337  BP: (!) 139/93   Pulse: (!) 55   Resp: 18   Temp:  36.6 C  SpO2: 98%     Last Pain:  Vitals:   05/18/19 1337  TempSrc: Oral  PainSc:                  Hermenegildo Clausen A

## 2019-05-18 NOTE — Anesthesia Procedure Notes (Addendum)
Procedure Name: Intubation Date/Time: 05/18/2019 11:30 AM Performed by: Ollen Bowl, CRNA Pre-anesthesia Checklist: Patient identified, Patient being monitored, Timeout performed, Emergency Drugs available and Suction available Patient Re-evaluated:Patient Re-evaluated prior to induction Oxygen Delivery Method: Circle system utilized Preoxygenation: Pre-oxygenation with 100% oxygen Induction Type: IV induction Ventilation: Mask ventilation without difficulty Laryngoscope Size: Mac and 3 Grade View: Grade I Tube type: Oral Tube size: 7.5 mm Number of attempts: 1 Airway Equipment and Method: Stylet Placement Confirmation: ETT inserted through vocal cords under direct vision,  positive ETCO2 and breath sounds checked- equal and bilateral Secured at: 21 cm Tube secured with: Tape Dental Injury: Teeth and Oropharynx as per pre-operative assessment

## 2019-05-18 NOTE — Op Note (Signed)
Patient:  Patrick Rubio  DOB:  [birth year]  MRN:  [id number]   Preop Diagnosis: Biliary colic, cholelithiasis  Postop Diagnosis: Same  Procedure: Laparoscopic cholecystectomy  Surgeon: Aviva Signs, MD  Assistant: Curlene Labrum, MD  Anes: General endotracheal  Indications: Patient is a 58 year old white male with a known history of cholelithiasis who was recently diagnosed with lymphoma/leukemia.  He is still symptomatic from his gallstones.  He thus presents for laparoscopic cholecystectomy.  The risks and benefits of the procedure including bleeding, infection, hepatobiliary injury, and the possibility of an open procedure were fully explained to the patient, who gave informed consent.  Procedure note: The patient was placed in the supine position.  After induction of general endotracheal anesthesia, the abdomen was prepped and draped using the usual sterile technique with ChloraPrep.  Surgical site confirmation was performed.  A supraumbilical incision was made down to the fascia.  A Veress needle was introduced into the abdominal cavity and confirmation of placement was done using the saline drop test.  The abdomen was then insufflated to 15 millimeters of mercury pressure.  An 11 mm trocar was introduced into the abdominal cavity under direct visualization without difficulty.  The patient was placed in reverse Trendelenburg position and an additional 105mm trocar was placed in the epigastric region and 5 mm trochars were placed in the right upper quadrant and right flank regions.  Liver was inspected and generally was within normal limits.  The gallbladder was retracted in a dynamic fashion in order to provide a critical view of the triangle of Calot.  The cystic duct was first identified.  Its juncture to the infundibulum was fully identified.  Endoclips were placed proximally and distally on the cystic duct, and the cystic duct was divided.  This was likewise done to the cystic artery.   The gallbladder was freed away from the gallbladder fossa using Bovie electrocautery.  The gallbladder was ultimately removed from the abdominal cavity with an Endo Catch catch bag.  The gallbladder fossa was inspected and any bleeding was controlled using Bovie electrocautery.  Surgicel was placed in the gallbladder fossa.  All fluid and air were then evacuated from the abdominal cavity prior to removal of the trochars.  All wounds were irrigated with normal saline.  All wounds were injected with Exparel.  The supraumbilical fascia was reapproximated using an 0 Vicryl interrupted suture.  All skin incisions were closed using a 4-0 Monocryl subcuticular suture.  Dermabond was applied.  All tape and needle counts were correct at the end of the procedure.  The patient was extubated in the operating room and transferred to PACU in stable condition.  Complications: None  EBL: Minimal  Specimen: Gallbladder

## 2019-05-18 NOTE — Discharge Instructions (Signed)
Laparoscopic Cholecystectomy, Care After This sheet gives you information about how to care for yourself after your procedure. Your health care provider may also give you more specific instructions. If you have problems or questions, contact your health care provider. What can I expect after the procedure? After the procedure, it is common to have:  Pain at your incision sites. You will be given medicines to control this pain.  Mild nausea or vomiting.  Bloating and possible shoulder pain from the air-like gas that was used during the procedure. Follow these instructions at home: Incision care   Follow instructions from your health care provider about how to take care of your incisions. Make sure you: ? Wash your hands with soap and water before you change your bandage (dressing). If soap and water are not available, use hand sanitizer. ? Change your dressing as told by your health care provider. ? Leave stitches (sutures), skin glue, or adhesive strips in place. These skin closures may need to be in place for 2 weeks or longer. If adhesive strip edges start to loosen and curl up, you may trim the loose edges. Do not remove adhesive strips completely unless your health care provider tells you to do that.  Do not take baths, swim, or use a hot tub until your health care provider approves. Ask your health care provider if you can take showers. You may only be allowed to take sponge baths for bathing.  Check your incision area every day for signs of infection. Check for: ? More redness, swelling, or pain. ? More fluid or blood. ? Warmth. ? Pus or a bad smell. Activity  Do not drive or use heavy machinery while taking prescription pain medicine.  Do not lift anything that is heavier than 10 lb (4.5 kg) until your health care provider approves.  Do not play contact sports until your health care provider approves.  Do not drive for 24 hours if you were given a medicine to help you relax  (sedative).  Rest as needed. Do not return to work or school until your health care provider approves. General instructions  Take over-the-counter and prescription medicines only as told by your health care provider.  To prevent or treat constipation while you are taking prescription pain medicine, your health care provider may recommend that you: ? Drink enough fluid to keep your urine clear or pale yellow. ? Take over-the-counter or prescription medicines. ? Eat foods that are high in fiber, such as fresh fruits and vegetables, whole grains, and beans. ? Limit foods that are high in fat and processed sugars, such as fried and sweet foods. Contact a health care provider if:  You develop a rash.  You have more redness, swelling, or pain around your incisions.  You have more fluid or blood coming from your incisions.  Your incisions feel warm to the touch.  You have pus or a bad smell coming from your incisions.  You have a fever.  One or more of your incisions breaks open. Get help right away if:  You have trouble breathing.  You have chest pain.  You have increasing pain in your shoulders.  You faint or feel dizzy when you stand.  You have severe pain in your abdomen.  You have nausea or vomiting that lasts for more than one day.  You have leg pain. This information is not intended to replace advice given to you by your health care provider. Make sure you discuss any questions you have   with your health care provider. Document Released: 07/16/2005 Document Revised: 06/28/2017 Document Reviewed: 01/02/2016 Elsevier Patient Education  2020 Else[employer] Inc[employer]  

## 2019-05-18 NOTE — Transfer of Care (Signed)
Immediate Anesthesia Transfer of Care Note  Patient: Patrick Rubio  Procedure(s) Performed: LAPAROSCOPIC CHOLECYSTECTOMY (N/A Abdomen)  Patient Location: PACU  Anesthesia Type:General  Level of Consciousness: awake  Airway & Oxygen Therapy: Patient Spontanous Breathing  Post-op Assessment: Report given to RN  Post vital signs: Reviewed and stable  Last Vitals:  Vitals Value Taken Time  BP 174/105 05/18/19 1225  Temp    Pulse 60 05/18/19 1227  Resp 18 05/18/19 1227  SpO2 100 % 05/18/19 1227  Vitals shown include unvalidated device data.  Last Pain:  Vitals:   05/18/19 0900  TempSrc: Oral  PainSc: 7          Complications: No apparent anesthesia complications

## 2019-05-18 NOTE — Progress Notes (Signed)
Pt's BP continued to be elevated in Recovery. Made Dr. Hilaria Ota aware, ordered Hydralazine 10 mg. As I was about to give med pt didn't need it. BP was 136/85 and continued to come down. I told pt to take Lisinopril as soon as he gets home. Pt verbalized understanding.

## 2019-05-18 NOTE — Anesthesia Preprocedure Evaluation (Signed)
Anesthesia Evaluation  Patient identified by MRN, date of birth, ID band Patient awake    Reviewed: Allergy & Precautions, NPO status , Patient's Chart, lab work & pertinent test results  Airway Mallampati: I  TM Distance: >3 FB Neck ROM: Full    Dental no notable dental hx. (+) Teeth Intact   Pulmonary neg pulmonary ROS, Current SmokerPatient did not abstain from smoking.,    Pulmonary exam normal breath sounds clear to auscultation       Cardiovascular Exercise Tolerance: Good hypertension, Pt. on medications + CAD, + Past MI and + Cardiac Stents  Normal cardiovascular examI Rhythm:Regular Rate:Normal  Reports last of 2 MIs ~2003 Reports last of 2 stents placed ~2004 Reports good ET Denies CP or Nitroglycerine use   Neuro/Psych negative neurological ROS  negative psych ROS   GI/Hepatic Neg liver ROS, GERD  Medicated and Controlled,  Endo/Other  negative endocrine ROS  Renal/GU negative Renal ROS  negative genitourinary   Musculoskeletal negative musculoskeletal ROS (+)   Abdominal   Peds negative pediatric ROS (+)  Hematology negative hematology ROS (+) Newly Dx'd lymphocytic lymphoma -scheduled to start Chemo later this month    Anesthesia Other Findings   Reproductive/Obstetrics negative OB ROS                             Anesthesia Physical Anesthesia Plan  ASA: III  Anesthesia Plan: General   Post-op Pain Management:    Induction: Intravenous  PONV Risk Score and Plan: 1 and Ondansetron, Midazolam and Treatment may vary due to age or medical condition  Airway Management Planned: Oral ETT  Additional Equipment:   Intra-op Plan:   Post-operative Plan: Extubation in OR  Informed Consent: I have reviewed the patients History and Physical, chart, labs and discussed the procedure including the risks, benefits and alternatives for the proposed anesthesia with the patient  or authorized representative who has indicated his/her understanding and acceptance.     Dental advisory given  Plan Discussed with: CRNA  Anesthesia Plan Comments: (Plan Full PPE use Plan GETA D/W PT -WTP with same after Q&A)        Anesthesia Quick Evaluation

## 2019-05-18 NOTE — Interval H&P Note (Signed)
History and Physical Interval Note:  05/18/2019 10:45 AM  Patrick Rubio  has presented today for surgery, with the diagnosis of CHRONIC CHOLECYSTITIS.  The various methods of treatment have been discussed with the patient and family. After consideration of risks, benefits and other options for treatment, the patient has consented to  Procedure(s): LAPAROSCOPIC CHOLECYSTECTOMY (N/A) as a surgical intervention.  The patient's history has been reviewed, patient examined, no change in status, stable for surgery.  I have reviewed the patient's chart and labs.  Questions were answered to the patient's satisfaction.     Aviva Signs

## 2019-05-19 LAB — SURGICAL PATHOLOGY

## 2019-05-19 NOTE — Addendum Note (Signed)
Addendum  created 05/19/19 [id number] by Ollen Bowl, CRNA   Charge Capture section accepted

## 2019-05-19 NOTE — Addendum Note (Signed)
Addendum  created 05/19/19 1120 by Mickel Baas, CRNA   Charge Capture section accepted

## 2019-05-20 ENCOUNTER — Ambulatory Visit (HOSPITAL_COMMUNITY): Payer: 59 | Admitting: Hematology

## 2019-05-20 ENCOUNTER — Encounter (HOSPITAL_COMMUNITY): Payer: Self-pay | Admitting: General Surgery

## 2019-05-25 LAB — IGVH SOMATIC HYPERMUTATION

## 2019-05-26 ENCOUNTER — Encounter (HOSPITAL_COMMUNITY): Payer: Self-pay | Admitting: Hematology

## 2019-05-26 ENCOUNTER — Inpatient Hospital Stay (HOSPITAL_BASED_OUTPATIENT_CLINIC_OR_DEPARTMENT_OTHER): Payer: 59 | Admitting: Hematology

## 2019-05-26 ENCOUNTER — Other Ambulatory Visit: Payer: Self-pay

## 2019-05-26 VITALS — BP 111/73 | HR 65 | Temp 97.5°F | Resp 16 | Wt 188.3 lb

## 2019-05-26 DIAGNOSIS — Z7189 Other specified counseling: Secondary | ICD-10-CM | POA: Diagnosis not present

## 2019-05-26 DIAGNOSIS — C9110 Chronic lymphocytic leukemia of B-cell type not having achieved remission: Secondary | ICD-10-CM | POA: Diagnosis not present

## 2019-05-26 DIAGNOSIS — C8300 Small cell B-cell lymphoma, unspecified site: Secondary | ICD-10-CM | POA: Diagnosis not present

## 2019-05-26 NOTE — Patient Instructions (Addendum)
[place] at [facility] Discharge Instructions  You were seen today by Dr. Delton Coombes. He went over your recent test results. He discussed your diagnosis of lymphoma, we can treat this but not sure it. He also went over your treatment options and what may be best for you. He recommends you starting the IV and pill combination. The IV medication will be given once a week for the first month and then once a month after that. The pill will come from a specialty pharmacy. He will see you back in 2 weeks for labs, treatment and follow up.   Thank you for choosing [place] at [facility] to provide your oncology and hematology care.  To afford each patient quality time with our provider, please arrive at least 15 minutes before your scheduled appointment time.   If you have a lab appointment with the [facility] please come in thru the  Main Entrance and check in at the main information desk  You need to re-schedule your appointment should you arrive 10 or more minutes late.  We strive to give you quality time with our providers, and arriving late affects you and other patients whose appointments are after yours.  Also, if you no show three or more times for appointments you may be dismissed from the clinic at the providers discretion.     Again, thank you for choosing [facility].  Our hope is that these requests will decrease the amount of time that you wait before being seen by our physicians.       _____________________________________________________________  Should you have questions after your visit to [facility], please contact our office at [phone] between the hours of 8:00 a.m. and 4:30 p.m.  Voicemails left after 4:00 p.m. will not be returned until the following business day.  For prescription refill requests, have your pharmacy contact our office and allow 72 hours.    Cancer Center Support Programs:   >  Cancer Support Group  2nd Tuesday of the month 1pm-2pm, Journey Room

## 2019-05-26 NOTE — Progress Notes (Signed)
START ON PATHWAY REGIMEN - Lymphoma and CLL     Cycle 1: A cycle is 28 days:     Venetoclax      Obinutuzumab      Obinutuzumab      Obinutuzumab    Cycle 2: A cycle is 28 days:     Venetoclax      Venetoclax      Venetoclax      Venetoclax      Obinutuzumab    Cycles 3 through 6: A cycle is 28 days:     Venetoclax      Obinutuzumab    Cycles 7 through 12: A cycle is 28 days:     Venetoclax   **Always confirm dose/schedule in your pharmacy ordering system**  Patient Characteristics: Small Lymphocytic Lymphoma (SLL), First Line, Treatment Indicated, 17p del(-)/Unknown and ATM Mutation Negative/Unknown and TP53 Mutation Negative/Unknown, Age ? 53 or Frail (Any Age) Disease Type: Small Lymphocytic Lymphoma (SLL) Disease Type: Not Applicable Disease Type: Not Applicable [place] Stage: III Line of Therapy: First Line Treatment Indicated<= Treatment Indicated ATM Mutation Status: Unknown 17p Deletion Status: Negative TP53 Mutation Status: Negative Patient Age: ? 30 Patient Condition: Fit Patient Intent of Therapy: Non-Curative / Palliative Intent, Discussed with Patient

## 2019-05-26 NOTE — Progress Notes (Signed)
[place], [place] [address]   CLINIC:  Medical Oncology/Hematology  PCP:  Orpah Greek, MD [address], [address] [place] [address] [phone]   REASON FOR VISIT:  Small lymphocytic lymphoma.  CURRENT THERAPY: Work-up in progress.    INTERVAL HISTORY:  Patrick Rubio 58 y.o. male seen for follow-up of small lymphocytic lymphoma.  He had about 56 pound weight loss in the last 1 and half year but denied any fevers or night sweats.  He had gallbladder removed on 05/18/2019.  He does not report any pain in the epigastric region at this time.  He is taking aspirin and Plavix since 2003 but denies any bleeding issues.  Denies any tingling or numbness in extremities.  Appetite is 25%.  Energy levels are 75%.    REVIEW OF SYSTEMS:  Review of Systems  Constitutional: Positive for unexpected weight change.  All other systems reviewed and are negative.    PAST MEDICAL/SURGICAL HISTORY:  Past Medical History:  Diagnosis Date   CAD (coronary artery disease)    GERD (gastroesophageal reflux disease)    HTN (hypertension)    Hypercholesterolemia    MI (myocardial infarction) ([place]) 2003   Past Surgical History:  Procedure Laterality Date   AXILLARY LYMPH NODE BIOPSY Left 04/27/2019   Procedure: AXILLARY LYMPH NODE BIOPSY;  Surgeon: Aviva Signs, MD;  Location: [facility];  Service: General;  Laterality: Left;   BIOPSY  11/26/2018   Procedure: BIOPSY;  Surgeon: Daneil Dolin, MD;  Location: [facility];  Service: Endoscopy;;   CHOLECYSTECTOMY N/A 05/18/2019   Procedure: LAPAROSCOPIC CHOLECYSTECTOMY;  Surgeon: Aviva Signs, MD;  Location: [facility];  Service: General;  Laterality: N/A;   CORONARY ANGIOPLASTY WITH STENT PLACEMENT     2003   ESOPHAGOGASTRODUODENOSCOPY N/A 11/26/2018   mild erosive reflux esophagitis, gastric erythema. Negative H.pylori    INGUINAL HERNIA REPAIR Right 04/27/2019   Procedure: HERNIA REPAIR INGUINAL  ADULT;  Surgeon: Aviva Signs, MD;  Location: [facility];  Service: General;  Laterality: Right;   KNEE SURGERY Right    lumbar fusion with cage  07/2017   NASAL SINUS SURGERY     TONSILLECTOMY AND ADENOIDECTOMY       SOCIAL HISTORY:  Social History   Socioeconomic History   Marital status: Divorced    Spouse name: Not on file   Number of children: 2   Years of education: Not on file   Highest education level: Not on file  Occupational History   Occupation: employed    Comment: part time at [employer]  Social Needs   Financial resource strain: Not hard at all   Food insecurity    Worry: Never true    Inability: Never true   Transportation needs    Medical: No    Non-medical: No  Tobacco Use   Smoking status: Current Every Day Smoker    Packs/day: 0.50    Years: 25.00    Pack years: 12.50    Types: Cigarettes   Smokeless tobacco: Never Used  Substance and Sexual Activity   Alcohol use: Yes    Comment: occ   Drug use: Never   Sexual activity: Yes  Lifestyle   Physical activity    Days per week: 4 days    Minutes per session: 60 min   Stress: Not at all  Relationships   Social connections    Talks on phone: More than three times a week    Gets  together: More than three times a week    Attends religious service: More than 4 times per year    Active member of club or organization: Yes    Attends meetings of clubs or organizations: 1 to 4 times per year    Relationship status: Divorced   Intimate partner violence    Fear of current or ex partner: No    Emotionally abused: No    Physically abused: No    Forced sexual activity: No  Other Topics Concern   Not on file  Social History Narrative   Not on file    FAMILY HISTORY:  Family History  Problem Relation Age of Onset   Colon polyps Father        thinks he may have had polyps, possibly in his 63s.    Heart attack Father    Dementia Mother    Hypotension Mother    Stroke  Sister    Diabetes Maternal Grandmother    Stroke Maternal Grandfather    Cancer Paternal Grandmother    Congestive Heart Failure Paternal Grandfather    Kidney cancer Daughter    Colon cancer Neg Hx    Pancreatic cancer Neg Hx    Liver disease Neg Hx     CURRENT MEDICATIONS:  Outpatient Encounter Medications as of 05/26/2019  Medication Sig Note   aspirin EC 81 MG tablet Take 81 mg by mouth daily. 05/14/2019: On hold for procedure   baclofen (LIORESAL) 10 MG tablet Take 10 mg by mouth 2 (two) times daily as needed for muscle spasms.     cimetidine (TAGAMET) 200 MG tablet Take 200 mg by mouth 2 (two) times daily.    clopidogrel (PLAVIX) 75 MG tablet Take 75 mg by mouth daily.  05/14/2019: On hold for procedure   escitalopram (LEXAPRO) 20 MG tablet Take 20 mg by mouth daily.     gabapentin (NEURONTIN) 100 MG capsule Take 100 mg by mouth 2 (two) times daily as needed (pain).     lisinopril (PRINIVIL,ZESTRIL) 10 MG tablet Take 10 mg by mouth daily.     naproxen sodium (ALEVE) 220 MG tablet Take 220-440 mg by mouth 2 (two) times daily as needed (pain).     oxyCODONE-acetaminophen (PERCOCET) 7.5-325 MG tablet Take 1 tablet by mouth every 6 (six) hours as needed.    pantoprazole (PROTONIX) 40 MG tablet Take 40 mg by mouth 2 (two) times daily.    simvastatin (ZOCOR) 40 MG tablet Take 40 mg by mouth every other day.     No facility-administered encounter medications on file as of 05/26/2019.     ALLERGIES:  No Known Allergies   PHYSICAL EXAM:  ECOG Performance status: 1  Vitals:   05/26/19 1000  BP: 111/73  Pulse: 65  Resp: 16  Temp: (!) 97.5 F (36.4 C)  SpO2: 99%   Filed Weights   05/26/19 1000  Weight: 188 lb 5 oz (85.4 kg)    Physical Exam Vitals signs reviewed.  Constitutional:      Appearance: Normal appearance.  Cardiovascular:     Rate and Rhythm: Normal rate and regular rhythm.     Heart sounds: Normal heart sounds.  Pulmonary:     Effort:  Pulmonary effort is normal.     Breath sounds: Normal breath sounds.  Abdominal:     General: There is no distension.     Palpations: Abdomen is soft. There is no mass.  Musculoskeletal:        General: No swelling.  Skin:    General: Skin is warm.  Neurological:     General: No focal deficit present.     Mental Status: He is alert and oriented to person, place, and time.  Psychiatric:        Mood and Affect: Mood normal.        Behavior: Behavior normal.    Left axillary lymph node palpable.  LABORATORY DATA:  I have reviewed the labs as listed.  CBC    Component Value Date/Time   WBC 9.5 05/05/2019 0739   RBC 4.60 05/05/2019 0739   HGB 13.4 05/05/2019 0739   HCT 41.8 05/05/2019 0739   PLT 227 05/05/2019 0739   MCV 90.9 05/05/2019 0739   MCH 29.1 05/05/2019 0739   MCHC 32.1 05/05/2019 0739   RDW 13.5 05/05/2019 0739   LYMPHSABS 3.0 05/05/2019 0739   MONOABS 0.7 05/05/2019 0739   EOSABS 0.5 05/05/2019 0739   BASOSABS 0.1 05/05/2019 0739   CMP Latest Ref Rng & Units 03/12/2019  Glucose 70 - 99 mg/dL 75  BUN 6 - 20 mg/dL 17  Creatinine 0.61 - 1.24 mg/dL 0.80  Sodium 135 - 145 mmol/L 136  Potassium 3.5 - 5.1 mmol/L 3.8  Chloride 98 - 111 mmol/L 105  CO2 22 - 32 mmol/L 24  Calcium 8.9 - 10.3 mg/dL 8.6(L)  Total Protein 6.5 - 8.1 g/dL 6.4(L)  Total Bilirubin 0.3 - 1.2 mg/dL 0.9  Alkaline Phos 38 - 126 U/L 52  AST 15 - 41 U/L 15  ALT 0 - 44 U/L 11       DIAGNOSTIC IMAGING:  I have independently reviewed the scans and discussed with the patient.    ASSESSMENT & PLAN:   Small lymphocytic lymphoma ([place]) 1.  Clinical stage IIIb small lymphocytic lymphoma: - 56 pound weight loss in the last 1 and half year.  No fevers or night sweats. - Right upper quadrant pain for the past 4 months, worse on eating and better on lying on left side. -PET/CT scan on 04/13/2019 showed pathologically enlarged mesenteric and retroperitoneal adenopathy.  SUV ranged from 2.6-3.2.   Left external iliac lymph node 0.9 cm, SUV 2.  1.1 cm left axillary lymph node, SUV 2.1.  Accentuated activity in the left sternoclavicular joint, maximum SUV 5.3, likely secondary to mild arthropathy. - Left axillary lymph node biopsy on 04/27/2019 consistent with small lymphocytic lymphoma. -We discussed the results of bone marrow biopsy which showed involvement by the lymphoma.  Chromosome analysis was 46, XY.  CLL FISH panel showed trisomy 12 which portends neutral prognosis. -T p53 mutation was not detected. -I GVH mutation analysis could not be reported, as analysis did not yield interpretable result.  Repeat sampling was recommended. -We discussed about treatment options including ibrutinib.  He is on aspirin and Plavix for cardiac reasons since 2003 when he had stents put in.  Because of the cardiac related side effects of ibrutinib, I have recommended against it.  We discussed about venetoclax and obinutuzumab combination which is a limited duration therapy for 1 year.  We discussed the schedule and side effects of this regimen in detail. -We will plan to start him in the next 2 weeks.  Indication for treatment is weight loss.  2.  Weight loss: -He had 56 pound weight loss in the last 1 and half year. -He had gallbladder resected on 05/18/2019 which showed chronic cholecystitis.  3.  TLS prophylaxis: -We have sent a prescription for allopurinol 300 mg daily.  We  will consider rasburicase during first cycle.  We will check his TLS labs prior to next cycle.   Total time spent is 40 minutes with more than 50% of the time spent face-to-face discussing biopsy results, treatment plan, counseling and coordination of care.  Orders placed this encounter:  Orders Placed This Encounter  Procedures   CBC with Differential/Platelet   Comprehensive metabolic panel      Derek Jack, MD [place] [phone]

## 2019-05-27 NOTE — Assessment & Plan Note (Addendum)
1.  Clinical stage IIIb small lymphocytic lymphoma: - 56 pound weight loss in the last 1 and half year.  No fevers or night sweats. - Right upper quadrant pain for the past 4 months, worse on eating and better on lying on left side. -PET/CT scan on 04/13/2019 showed pathologically enlarged mesenteric and retroperitoneal adenopathy.  SUV ranged from 2.6-3.2.  Left external iliac lymph node 0.9 cm, SUV 2.  1.1 cm left axillary lymph node, SUV 2.1.  Accentuated activity in the left sternoclavicular joint, maximum SUV 5.3, likely secondary to mild arthropathy. - Left axillary lymph node biopsy on 04/27/2019 consistent with small lymphocytic lymphoma. -We discussed the results of bone marrow biopsy which showed involvement by the lymphoma.  Chromosome analysis was 46, XY.  CLL FISH panel showed trisomy 12 which portends neutral prognosis. -T p53 mutation was not detected. -I GVH mutation analysis could not be reported, as analysis did not yield interpretable result.  Repeat sampling was recommended. -We discussed about treatment options including ibrutinib.  He is on aspirin and Plavix for cardiac reasons since 2003 when he had stents put in.  Because of the cardiac related side effects of ibrutinib, I have recommended against it.  We discussed about venetoclax and obinutuzumab combination which is a limited duration therapy for 1 year.  We discussed the schedule and side effects of this regimen in detail. -We will plan to start him in the next 2 weeks.  Indication for treatment is weight loss.  2.  Weight loss: -He had 56 pound weight loss in the last 1 and half year. -He had gallbladder resected on 05/18/2019 which showed chronic cholecystitis.  3.  TLS prophylaxis: -We have sent a prescription for allopurinol 300 mg daily.  We will consider rasburicase during first cycle.  We will check his TLS labs prior to next cycle.

## 2019-05-29 ENCOUNTER — Telehealth: Payer: Self-pay | Admitting: General Surgery

## 2019-05-29 ENCOUNTER — Other Ambulatory Visit: Payer: Self-pay

## 2019-05-29 NOTE — Telephone Encounter (Signed)
Telephone visit performed.  Patient is doing well.  Has no complaints.  His preoperative pain and symptoms are gone.  He is about to start treatment for his lymphoma.  He will contact me should he need a Port-A-Cath.  Follow-up as needed.

## 2019-05-31 MED ORDER — ACYCLOVIR 400 MG PO TABS: 400.0000 mg | ORAL_TABLET | Freq: Every day | ORAL | 5 refills | Status: DC

## 2019-05-31 MED ORDER — SULFAMETHOXAZOLE-TRIMETHOPRIM 800-160 MG PO TABS: 1.0000 | ORAL_TABLET | ORAL | 5 refills | Status: DC

## 2019-05-31 MED ORDER — ALLOPURINOL 300 MG PO TABS: 300.0000 mg | ORAL_TABLET | Freq: Every day | ORAL | 0 refills | Status: DC

## 2019-05-31 NOTE — Patient Instructions (Signed)
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  You be treated with an immunotherapy medication called obinutuzumab Dyann Kief).  You will receive this medication in the clinic weekly x 4 weeks (cycle 1), then return in 2 weeks to begin cycle 2.  After cycle 2 you will come to the clinic for treatment every 28 days (every 4 weeks).  The intent of treatment is to control your cancer and to help alleviate any symptoms you may be having related to your disease.  You will see the doctor regularly throughout treatment.  We will obtain blood work from you prior to every treatment and monitor your results to make sure it is safe to give your treatment. The doctor monitors your response to treatment by the way you are feeling, your blood work, and by obtaining scans periodically.  There will be wait times while you are here for treatment.  It will take about 30 minutes to 1 hour for your lab work to result.  Then there will be wait times while pharmacy mixes your medications.    Medications you will receive in the clinic prior to your chemotherapy medications:  Pepcid:  This medication is a histamine blocker that helps prevent and allergic reaction to your chemotherapy.   Dexamethasone:  This is a steroid given prior to chemotherapy to help prevent allergic reactions; it may also help prevent and control nausea and diarrhea.   Benadryl:  This is a histamine blocker (different from the Pepcid) that helps prevent allergic/infusion reactions to your chemotherapy. This medication may cause dizziness/drowsiness.   Tylenol:  Given to help prevent infusion related reactions (fever, chills)   Obinutuzumab Dyann Kief)  About This Drug Obinutuzumab is used to treat cancer. It is given in the vein (IV).  Possible Side Effects . A decrease in the number of white blood cells. This may raise your risk of infection. . Diarrhea (loose bowel movements) .  Constipation (unable to move bowels) . While you are getting this drug in your vein (IV), you may have a reaction to the drug. Sometimes you may be given medication to stop or lessen these side effects. Your nurse will check you closely for these signs: fever or shaking chills, flushing, facial swelling, feeling dizzy, headache, trouble breathing, rash, itching, chest tightness, or chest pain. These reactions may happen after your infusion. If this happens, call 911 for emergency care. . Tiredness . Bone and muscle pain . Cough . Upper respiratory infection  Note: Each of the side effects above was reported in 20% or greater of patients treated with obinutuzumab given in combination with chemotherapy. Not all possible side effects are included above.  Warnings and Precautions . Severe infusion reactions and allergic reactions which can be life-threatening. Signs of allergic reaction to this drug may be swelling of the face, feeling like your tongue or throat are swelling, trouble breathing, rash, itching, fever, chills, feeling dizzy, and/or feeling that your heart is beating in a fast or not normal way. If this happens, do not take another dose of this drug. You should get urgent medical treatment. . Severe decrease in the numbers of white blood cells and platelets which can be life-threatening . Severe infections, including viral, bacterial, and fungal, which can very rarely be life-threatening . Reactivation of the hepatitis B virus if you have been exposed in the past, which may very rarely cause liver failure and be life-threatening . Changes in your central nervous system  can happen which can rarely be life-threatening. The central nervous system is made up of your brain and spinal cord. You could feel extreme tiredness, agitation, confusion, have hallucinations (see or hear things that are not there), trouble understanding or speaking, loss of control of your bowels or bladder,  eyesight changes, numbness or lack of strength to your arms, legs, face, or body, seizures or coma. If you start to have any of these symptoms let your doctor know right away. . Tumor lysis syndrome: This drug may act on the cancer cells very quickly. This may affect how your kidneys work and rarely be life-threatening.  Note: Some of the side effects above are very rare. If you have concerns and/or questions, please discuss them with your medical team.  Important Information . Talk to your doctor before receiving any vaccinations during and while you are recovering from your treatment. Some vaccinations are not recommended during or following obinutuzumab. . This drug may be present in the saliva, tears, sweat, urine, stool, vomit, semen, and vaginal secretions. Talk to your doctor and/or your nurse about the necessary precautions to take during this time.  Treating Side Effects . Manage tiredness by pacing your activities for the day. . Be sure to include periods of rest between energy-draining activities. . To decrease the risk of infection, wash your hands regularly. . Avoid close contact with people who have a cold, the flu, or other infections. . Take your temperature as your doctor or nurse tells you, and whenever you feel like you may have a fever. . To decrease the risk of bleeding, use a soft toothbrush. Check with your nurse before using dental floss. . Be very careful when using knives or tools. . Use an electric shaver instead of a razor. [name] Keeping your pain under control is important to your wellbeing. Please tell your doctor or nurse if you are experiencing pain. . Get regular exercise, with your doctor's approval. If you feel too tired to exercise vigorously, try taking a short walk. . Drink plenty of fluids (a minimum of eight glasses per day is recommended). . If you throw up or have loose bowel movements, you should drink more fluids so that you do not become  dehydrated (lack of water in the body from losing too much fluid). . If you have diarrhea, eat low-fiber foods that are high in protein and calories and avoid foods that can irritate your digestive tracts or lead to cramping. . Ask your doctor or nurse about medicines that are available to help stop or lessen constipation and/ or diarrhea. . If you are not able to move your bowels, check with your doctor or nurse before you use enemas, laxatives, or suppositories. . Infusion reactions may occur after your infusion. If this happens, call 911 for emergency care.  Food and Drug Interactions . There are no known interactions of obinutuzumab with food. . Tell your doctor and pharmacist about all the prescription and over-the-counter medicines and dietary supplements (vitamins, minerals, herbs, and others) that you are taking at this time. Also, check with your doctor or pharmacist before starting any new prescription or over-the-counter medicines, or dietary supplements to make sure that there are no interactions.  When to Call the Doctor Call your doctor or nurse if you have any of these symptoms and/or any new or unusual symptoms: . Fever of 100.4 F (38 C) or higher . Chills . Tiredness or weakness that interferes with your daily activities . Feeling dizzy or  lightheaded . Chest pain . Easy bleeding or bruising . Headache that does not go away . Confusion and/or agitation . Seizures. Symptoms of a seizure such as confusion, blacking out, passing out, loss of hearing or vision, blurred vision, unusual smells or tastes (such as burning rubber), trouble talking, tremors or shaking in parts or all of the body, repeated body movements, tense muscles that do not relax, and loss of control of urine and bowels. If you or your family member suspects you are having a seizure, call 911 right away. . Hallucinations . Trouble understanding or speaking . Blurry vision or changes in your eyesight .  Numbness or lack of strength to your arms, legs, face, or body . Wheezing or trouble breathing . Coughing up yellow, green, or bloody mucus . Diarrhea, 4 times in one day or diarrhea with lack of strength or a feeling of being dizzy . No bowel movement in 3 days or when you feel uncomfortable. . Signs of tumor lysis: confusion or agitation, decreased urine, nausea/vomiting, diarrhea, muscle cramping, numbness and/or tingling, seizures. . Signs of infusion reaction: fever or shaking chills, flushing, facial swelling, feeling dizzy, headache, trouble breathing, rash, itching, chest tightness, or chest pain. If this happens, call 911 for emergency care. . Signs of allergic reaction: swelling of the face, feeling like your tongue or throat are swelling, trouble breathing, rash, itching, fever, chills, feeling dizzy, and/or feeling that your heart is beating in a fast or not normal way. If this happens, call 911 for emergency care. . Signs of possible liver problems: dark urine, pale bowel movements, bad stomach pain, feeling very tired and weak, unusual itching, or yellowing of the eyes or skin . If you think you may be pregnant  Reproduction Warnings . Pregnancy warning: This drug can have harmful effects on the unborn baby. Women of childbearing potential should use effective methods of birth control during your cancer treatment and for at least 6 months after treatment. . Breastfeeding warning: It is not known if this drug passes into breast milk. For this reason, women should not breastfeed during treatment and for 6 months after treatment because this drug could enter the breast milk and cause harm to a breastfeeding baby. . Fertility warning: Human fertility studies have not been done with this drug. Talk with your doctor or nurse if you plan to have children. Ask for information on sperm or egg banking.  SELF CARE ACTIVITIES WHILE ON IMMUNOTHERAPY:  Hydration Increase your fluid  intake 48 hours prior to treatment and drink at least 8 to 12 cups (64 ounces) of water/decaffeinated beverages per day after treatment. You can still have your cup of coffee or soda but these beverages do not count as part of your 8 to 12 cups that you need to drink daily. No alcohol intake.  Medications Continue taking your normal prescription medication as prescribed.  If you start any new herbal or new supplements please let us know first to make sure it is safe.  Mouth Care Have teeth cleaned professionally before starting treatment. Keep dentures and partial plates clean. Use soft toothbrush and do not use mouthwashes that contain alcohol. Biotene is a good mouthwash that is available at most pharmacies or may be ordered by calling [phone]. Use warm salt water gargles (1 teaspoon salt per 1 quart warm water) before and after meals and at bedtime. Or you may rinse with 2 tablespoons of three-percent hydrogen peroxide mixed in eight ounces of water. If you  are still having problems with your mouth or sores in your mouth please call the clinic. If you need dental work, please let the doctor know before you go for your appointment so that we can coordinate the best possible time for you in regards to your chemo regimen. You need to also let your dentist know that you are actively taking chemo. We may need to do labs prior to your dental appointment.  Skin Care Always use sunscreen that has not expired and with SPF (Sun Protection Factor) of 50 or higher. Wear hats to protect your head from the sun. Remember to use sunscreen on your hands, ears, face, & feet.  Use good moisturizing lotions such as udder cream, eucerin, or even Vaseline. Some chemotherapies can cause dry skin, color changes in your skin and nails.    . Avoid long, hot showers or baths. . Use gentle, fragrance-free soaps and laundry detergent. . Use moisturizers, preferably creams or ointments rather than lotions because the  thicker consistency is better at preventing skin dehydration. Apply the cream or ointment within 15 minutes of showering. Reapply moisturizer at night, and moisturize your hands every time after you wash them.   Infection Prevention Please wash your hands for at least 30 seconds using warm soapy water. Handwashing is the #1 way to prevent the spread of germs. Stay away from sick people or people who are getting over a cold. If you develop respiratory systems such as green/yellow mucus production or productive cough or persistent cough let us know and we will see if you need an antibiotic. It is a good idea to keep a pair of gloves on when going into grocery stores/[employer] to decrease your risk of coming into contact with germs on the carts, etc. Carry alcohol hand gel with you at all times and use it frequently if out in public. If your temperature reaches 100.5 or higher please call the clinic and let us know.  If it is after hours or on the weekend please go to the ER if your temperature is over 100.4.  Please have your own personal thermometer at home to use.    Sex and bodily fluids If you are going to have sex, a condom must be used to protect the person that isn't taking immunotherapy. For a few days after treatment, immunotherapy can be excreted through your bodily fluids.  When using the toilet please close the lid and flush the toilet twice.  Do this for a few day after you have had immunotherapy.   Contraception It is not known for sure whether or not immunotherapy drugs can be passed on through semen or secretions from the vagina. Because of this some doctors advise people to use a barrier method if you have sex during treatment. This applies to vaginal, anal or oral sex.  Generally, doctors advise a barrier method only for the time you are actually having the treatment and for about a week after your treatment.  Advice like this can be worrying, but this does not mean that you have to avoid  being intimate with your partner. You can still have close contact with your partner and continue to enjoy sex.  Animals If you have cats or birds we just ask that you not change the litter or change the cage.  Please have someone else do this for you while you are on immunotherapy.   Food Safety During and After Cancer Treatment Food safety is important for people both during and  after cancer treatment. Cancer and cancer treatments, such as chemotherapy, radiation therapy, and stem cell/bone marrow transplantation, often weaken the immune system. This makes it harder for your body to protect itself from foodborne illness, also called food poisoning. Foodborne illness is caused by eating food that contains harmful bacteria, parasites, or viruses.  Foods to avoid Some foods have a higher risk of becoming tainted with bacteria. These include: [name] Unwashed fresh fruit and vegetables, especially leafy vegetables that can hide dirt and other contaminants . Raw sprouts, such as alfalfa sprouts . Raw or undercooked beef, especially ground beef, or other raw or undercooked meat and poultry . Fatty, fried, or spicy foods immediately before or after treatment.  These can sit heavy on your stomach and make you feel nauseous. . Raw or undercooked shellfish, such as oysters. . Sushi and sashimi, which often contain raw fish.  . Unpasteurized beverages, such as unpasteurized fruit juices, raw milk, raw yogurt, or cider . Undercooked eggs, such as soft boiled, over easy, and poached; raw, unpasteurized eggs; or foods made with raw egg, such as homemade raw cookie dough and homemade mayonnaise  Simple steps for food safety  Shop smart. . Do not buy food stored or displayed in an unclean area. . Do not buy bruised or damaged fruits or vegetables. . Do not buy cans that have cracks, dents, or bulges. . Pick up foods that can spoil at the end of your shopping trip and store them in a cooler on the way  home.  Prepare and clean up foods carefully. . Rinse all fresh fruits and vegetables under running water, and dry them with a clean towel or paper towel. . Clean the top of cans before opening them. . After preparing food, wash your hands for 20 seconds with hot water and soap. Pay special attention to areas between fingers and under nails. . Clean your utensils and dishes with hot water and soap. [name] Disinfect your kitchen and cutting boards using 1 teaspoon of liquid, unscented bleach mixed into 1 quart of water.    Dispose of old food. . Eat canned and packaged food before its expiration date (the "use by" or "best before" date). . Consume refrigerated leftovers within 3 to 4 days. After that time, throw out the food. Even if the food does not smell or look spoiled, it still may be unsafe. Some bacteria, such as Listeria, can grow even on foods stored in the refrigerator if they are kept for too long.  Take precautions when eating out. . At restaurants, avoid buffets and salad bars where food sits out for a long time and comes in contact with many people. Food can become contaminated when someone with a virus, often a norovirus, or another "bug" handles it. . Put any leftover food in a "to-go" container yourself, rather than having the server do it. And, refrigerate leftovers as soon as you get home. . Choose restaurants that are clean and that are willing to prepare your food as you order it cooked.     SYMPTOMS TO REPORT AS SOON AS POSSIBLE AFTER TREATMENT:   FEVER GREATER THAN 100.5 F  CHILLS WITH OR WITHOUT FEVER  NAUSEA AND VOMITING THAT IS NOT CONTROLLED WITH YOUR NAUSEA MEDICATION  UNUSUAL SHORTNESS OF BREATH  UNUSUAL BRUISING OR BLEEDING  TENDERNESS IN MOUTH AND THROAT WITH OR WITHOUT PRESENCE OF ULCERS  URINARY PROBLEMS  BOWEL PROBLEMS  UNUSUAL RASH    Wear comfortable clothing and clothing appropriate for easy  access to any Portacath or PICC line. Let us know if  there is anything that we can do to make your therapy better!  What to do if you need assistance after hours or on the weekends: CALL [phone].  HOLD on the line, do not hang up.  You will hear multiple messages but at the end you will be connected with a nurse triage line.  They will contact the doctor if necessary.  Most of the time they will be able to assist you.  Do not call the hospital operator.    I have been informed and understand all of the instructions given to me and have received a copy. I have been instructed to call the clinic [phone] or my family physician as soon as possible for continued medical care, if indicated. I do not have any more questions at this time but understand that I may call the [facility] or the Patient Navigator at [phone] during office hours should I have questions or need assistance in obtaining follow-up care.

## 2019-06-02 ENCOUNTER — Inpatient Hospital Stay (HOSPITAL_COMMUNITY): Payer: 59 | Attending: Hematology | Admitting: General Practice

## 2019-06-02 ENCOUNTER — Encounter (HOSPITAL_COMMUNITY): Payer: Self-pay | Admitting: General Practice

## 2019-06-02 DIAGNOSIS — R634 Abnormal weight loss: Secondary | ICD-10-CM | POA: Insufficient documentation

## 2019-06-02 DIAGNOSIS — I10 Essential (primary) hypertension: Secondary | ICD-10-CM | POA: Insufficient documentation

## 2019-06-02 DIAGNOSIS — Z5111 Encounter for antineoplastic chemotherapy: Secondary | ICD-10-CM | POA: Insufficient documentation

## 2019-06-02 DIAGNOSIS — F17210 Nicotine dependence, cigarettes, uncomplicated: Secondary | ICD-10-CM | POA: Insufficient documentation

## 2019-06-02 DIAGNOSIS — Z79899 Other long term (current) drug therapy: Secondary | ICD-10-CM | POA: Insufficient documentation

## 2019-06-02 DIAGNOSIS — C9110 Chronic lymphocytic leukemia of B-cell type not having achieved remission: Secondary | ICD-10-CM | POA: Insufficient documentation

## 2019-06-02 DIAGNOSIS — R1011 Right upper quadrant pain: Secondary | ICD-10-CM | POA: Insufficient documentation

## 2019-06-02 NOTE — Progress Notes (Signed)
[facility] Initial Psychosocial Assessment Clinical Social Work  Clinical Social Work contacted by phone to assess psychosocial, emotional, mental health, and spiritual needs of the patient.   Barriers to care/review of distress screen:  - Transportation:  Do you anticipate any problems getting to appointments?  Do you have someone who can help run errands for you if you need it?  No concerns, approx half hour to [facility]. - Help at home:  What is your living situation (alone, family, other)?  If you are physically unable to care for yourself, who would you call on to help you?  Lives w girlfriend of 20 plus years.  She is a Marine scientist, also has daughters who can help if needed.   - Support system:  What does your support system look like?  Who would you call on if you needed some kind of practical help?  What if you needed someone to talk to for emotional support?  Would call son in law or stepson if needed help of practical nature - girlfriend, sister, daughters are emotional support.  - Finances:  Are you concerned about finances   Considering returning to work?  If not, applying for disability?  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  Continued his income.  Has full insurance through his girlfriends coverage.  Has short term disability from previous job through [facility] [employer].  Interested in information about Social Security disability, Medicaid and/or Medicare.  What is your understanding of where you are with your cancer? Its cause?  Your treatment plan and what happens next? New diagnosis from one month ago.  Was surprised at diagnosis of cancer, went to hospital due to gall bladder area pain.  Learned he had gallstones, hernia - had surgery/biopsy and hernia at same time.  "The whole month of October was not good, I thought the weight loss was coming from inability to eat due to gall bladder."  Learned he has "several very large lymph  nodes", oncologist "insisted I begin treatment", wonders if he is "jumping the gun", "do I see if I gain the weight back before I start treatment?"  Wonders whether he needs chemo treatments if he is able to regain weight."  Wants to time treatments based on insurance availability. "I still have plenty of energy, my blood count is good, nothing is out of whack."  CT scan is "the only thing that picked it up."   What are your worries for the future as you begin treatment for cancer?  Not convinced that his situation requires chemotherapy as "nothing is bothering me" at this time.  Is weighing "getting an early jump on things" vs "feeling good for another year and if it gets out of hand Ill wish I had taken the treatments."  Leaning towards "feeling bad now" and taking the treatments.    What are your hopes and priorities during your treatment? What is important to you? What are your goals for your care?  Wants to have a better understanding of risks/benefits of treatment.  Wants to be able to interact w grandchildren, have a social life, work part time.  In job is around "a lot of people."  Likes to be active, engaged, working, "Im not the type who wants to sit in the house doing nothing."    What are you NOT willing to sacrifice during your treatment?  Being up and active, being around other people.  CSW Summary:  Patient and family psychosocial functioning including strengths, limitations, and coping skills:  Lives with longtime girlfriend who is supportive.  Retired after back injury, works part time currently.  Multiple medical issues in October 2020 - did not expect to be diagnosed w cancer.  Unsure about costs/benefits of starting chemo treatments now vs waiting a year until "things get bad."  Difficult to try to start treatments after having seen other struggle during chemo treatments, worries he will feel worse than he does right now.  Wants to remain active and engaged with others vs being  debilitated as he fears chemo might lead to.   Identifications of barriers to care:  None noted, has insurance through his girlfriend.  Has income but is considering option of applying for Social Security disability.  Had questions about Medicare/Medicaid.    Availability of community resources:  Suggested gaining more information on his condition through [facility] or Leukemia/Lymphoma Organization.  Clinical Social Worker follow up needed: No.   Edwyna Shell, [place] Social Worker Phone:  [phone] Cell:  [phone]

## 2019-06-04 ENCOUNTER — Other Ambulatory Visit: Payer: Self-pay

## 2019-06-04 ENCOUNTER — Inpatient Hospital Stay (HOSPITAL_COMMUNITY): Payer: 59

## 2019-06-04 DIAGNOSIS — F17210 Nicotine dependence, cigarettes, uncomplicated: Secondary | ICD-10-CM | POA: Diagnosis not present

## 2019-06-04 DIAGNOSIS — R1011 Right upper quadrant pain: Secondary | ICD-10-CM | POA: Diagnosis not present

## 2019-06-04 DIAGNOSIS — C9110 Chronic lymphocytic leukemia of B-cell type not having achieved remission: Secondary | ICD-10-CM | POA: Diagnosis not present

## 2019-06-04 DIAGNOSIS — C8300 Small cell B-cell lymphoma, unspecified site: Secondary | ICD-10-CM

## 2019-06-04 DIAGNOSIS — Z5111 Encounter for antineoplastic chemotherapy: Secondary | ICD-10-CM | POA: Diagnosis not present

## 2019-06-04 DIAGNOSIS — R634 Abnormal weight loss: Secondary | ICD-10-CM | POA: Diagnosis not present

## 2019-06-04 DIAGNOSIS — I10 Essential (primary) hypertension: Secondary | ICD-10-CM | POA: Diagnosis not present

## 2019-06-04 DIAGNOSIS — Z79899 Other long term (current) drug therapy: Secondary | ICD-10-CM | POA: Diagnosis not present

## 2019-06-04 LAB — CBC WITH DIFFERENTIAL/PLATELET
Abs Immature Granulocytes: 0.02 10*3/uL (ref 0.00–0.07)
Basophils Absolute: 0.1 10*3/uL (ref 0.0–0.1)
Basophils Relative: 1 %
Eosinophils Absolute: 0.5 10*3/uL (ref 0.0–0.5)
Eosinophils Relative: 6 %
HCT: 42.5 % (ref 39.0–52.0)
Hemoglobin: 13.5 g/dL (ref 13.0–17.0)
Immature Granulocytes: 0 %
Lymphocytes Relative: 36 %
Lymphs Abs: 3 10*3/uL (ref 0.7–4.0)
MCH: 28.8 pg (ref 26.0–34.0)
MCHC: 31.8 g/dL (ref 30.0–36.0)
MCV: 90.8 fL (ref 80.0–100.0)
Monocytes Absolute: 0.6 10*3/uL (ref 0.1–1.0)
Monocytes Relative: 7 %
Neutro Abs: 4.2 10*3/uL (ref 1.7–7.7)
Neutrophils Relative %: 50 %
Platelets: 227 10*3/uL (ref 150–400)
RBC: 4.68 MIL/uL (ref 4.22–5.81)
RDW: 13.4 % (ref 11.5–15.5)
WBC: 8.4 10*3/uL (ref 4.0–10.5)
nRBC: 0 % (ref 0.0–0.2)

## 2019-06-04 LAB — COMPREHENSIVE METABOLIC PANEL
ALT: 14 U/L (ref 0–44)
AST: 17 U/L (ref 15–41)
Albumin: 3.8 g/dL (ref 3.5–5.0)
Alkaline Phosphatase: 78 U/L (ref 38–126)
Anion gap: 7 (ref 5–15)
BUN: 16 mg/dL (ref 6–20)
CO2: 26 mmol/L (ref 22–32)
Calcium: 9 mg/dL (ref 8.9–10.3)
Chloride: 107 mmol/L (ref 98–111)
Creatinine, Ser: 0.77 mg/dL (ref 0.61–1.24)
GFR calc Af Amer: >60 mL/min (ref >60)
GFR calc non Af Amer: >60 mL/min (ref >60)
Glucose, Bld: 70 mg/dL (ref 70–99)
Potassium: 4.4 mmol/L (ref 3.5–5.1)
Sodium: 140 mmol/L (ref 135–145)
Total Bilirubin: 0.8 mg/dL (ref 0.3–1.2)
Total Protein: 6.7 g/dL (ref 6.5–8.1)

## 2019-06-04 LAB — URIC ACID: Uric Acid, Serum: 5.6 mg/dL (ref 3.7–8.6)

## 2019-06-04 LAB — HEPATITIS B CORE ANTIBODY, TOTAL: Hep B Core Total Ab: NONREACTIVE

## 2019-06-04 LAB — PHOSPHORUS: Phosphorus: 3.4 mg/dL (ref 2.5–4.6)

## 2019-06-04 NOTE — Progress Notes (Signed)
Immunotherapy education packet given and discussed with pt in detail.  Discussed diagnosis, staging, tx regimen, and intent of tx.  Reviewed immunotherapy medications and side effects.  Instructed on how to manage side effects at home, and when to call the clinic.  Importance of fever/chills discussed with pt and family. Discussed precautions to implement at home after receiving tx, as well as self care strategies. Phone numbers provided for clinic during regular working hours, also how to reach the clinic after hours and on weekends. Pt provided the opportunity to ask questions - all questions answered to pt's satisfaction.     

## 2019-06-09 ENCOUNTER — Inpatient Hospital Stay (HOSPITAL_COMMUNITY): Payer: 59

## 2019-06-09 ENCOUNTER — Inpatient Hospital Stay (HOSPITAL_BASED_OUTPATIENT_CLINIC_OR_DEPARTMENT_OTHER): Payer: 59 | Admitting: Hematology

## 2019-06-09 ENCOUNTER — Other Ambulatory Visit: Payer: Self-pay

## 2019-06-09 ENCOUNTER — Encounter (HOSPITAL_COMMUNITY): Payer: Self-pay | Admitting: Hematology

## 2019-06-09 ENCOUNTER — Telehealth (HOSPITAL_COMMUNITY): Payer: Self-pay | Admitting: Pharmacist

## 2019-06-09 ENCOUNTER — Encounter (HOSPITAL_COMMUNITY): Payer: Self-pay | Admitting: *Deleted

## 2019-06-09 VITALS — BP 140/70 | HR 70 | Temp 96.9°F | Resp 16

## 2019-06-09 VITALS — BP 125/73 | HR 61 | Temp 97.5°F | Resp 18 | Wt 188.8 lb

## 2019-06-09 DIAGNOSIS — C8300 Small cell B-cell lymphoma, unspecified site: Secondary | ICD-10-CM | POA: Diagnosis not present

## 2019-06-09 DIAGNOSIS — C9110 Chronic lymphocytic leukemia of B-cell type not having achieved remission: Secondary | ICD-10-CM | POA: Diagnosis not present

## 2019-06-09 MED ORDER — DIPHENHYDRAMINE HCL 50 MG/ML IJ SOLN
50.0000 mg | Freq: Once | INTRAMUSCULAR | Status: AC
  Administered 2019-06-09: 50 mg via INTRAVENOUS
  Filled 2019-06-09: qty 1

## 2019-06-09 MED ORDER — ACYCLOVIR 400 MG PO TABS: 400.0000 mg | ORAL_TABLET | Freq: Two times a day (BID) | ORAL | 5 refills | Status: DC

## 2019-06-09 MED ORDER — SODIUM CHLORIDE 0.9 % IV SOLN
20.0000 mg | Freq: Once | INTRAVENOUS | Status: AC
  Administered 2019-06-09: 20 mg via INTRAVENOUS
  Filled 2019-06-09: qty 20

## 2019-06-09 MED ORDER — SODIUM CHLORIDE 0.9 % IV SOLN
Freq: Once | INTRAVENOUS | Status: AC
  Administered 2019-06-09: 09:00:00 via INTRAVENOUS

## 2019-06-09 MED ORDER — VENETOCLAX 10 & 50 & 100 MG PO TBPK: ORAL_TABLET | ORAL | 0 refills | Status: DC

## 2019-06-09 MED ORDER — ACETAMINOPHEN 325 MG PO TABS
650.0000 mg | ORAL_TABLET | Freq: Once | ORAL | Status: AC
  Administered 2019-06-09: 650 mg via ORAL
  Filled 2019-06-09: qty 2

## 2019-06-09 MED ORDER — SODIUM CHLORIDE 0.9 % IV SOLN
100.0000 mg | Freq: Once | INTRAVENOUS | Status: AC
  Administered 2019-06-09: 100 mg via INTRAVENOUS
  Filled 2019-06-09: qty 4

## 2019-06-09 MED ORDER — FAMOTIDINE IN NACL 20-0.9 MG/50ML-% IV SOLN
20.0000 mg | Freq: Once | INTRAVENOUS | Status: AC
  Administered 2019-06-09: 20 mg via INTRAVENOUS
  Filled 2019-06-09: qty 50

## 2019-06-09 MED ORDER — ACYCLOVIR 400 MG PO TABS: 400.0000 mg | ORAL_TABLET | Freq: Two times a day (BID) | ORAL | 3 refills | Status: DC

## 2019-06-09 NOTE — Progress Notes (Signed)
Labs reviewed at office visit today. Will proceed with day 1,labs tomorrow to recheck uric acid and evaluate after that for day 2.   Peripheral iv site checked by 2 RN's, good blood return noted prior to starting [facility].  Treatment given per orders. Patient tolerated it well without problems. Vitals stable and discharged home from clinic ambulatory. Follow up as scheduled.

## 2019-06-09 NOTE — Patient Instructions (Signed)

## 2019-06-09 NOTE — Patient Instructions (Addendum)
[facility] at [facility] Discharge Instructions  You were seen today by Dr. Katragadda. He went over your recent lab results. He will see you back in 1 week for labs and follow up.   Thank you for choosing [facility] at [facility] to provide your oncology and hematology care.  To afford each patient quality time with our provider, please arrive at least 15 minutes before your scheduled appointment time.   If you have a lab appointment with the Cancer Center please come in thru the  Main Entrance and check in at the main information desk  You need to re-schedule your appointment should you arrive 10 or more minutes late.  We strive to give you quality time with our providers, and arriving late affects you and other patients whose appointments are after yours.  Also, if you no show three or more times for appointments you may be dismissed from the clinic at the providers discretion.     Again, thank you for choosing [facility].  Our hope is that these requests will decrease the amount of time that you wait before being seen by our physicians.       _____________________________________________________________  Should you have questions after your visit to [facility], please contact our office at [phone] between the hours of 8:00 a.m. and 4:30 p.m.  Voicemails left after 4:00 p.m. will not be returned until the following business day.  For prescription refill requests, have your pharmacy contact our office and allow 72 hours.    Cancer Center Support Programs:   > Cancer Support Group  2nd Tuesday of the month 1pm-2pm, Journey Room    

## 2019-06-09 NOTE — Progress Notes (Signed)
[address], [place] [address]   CLINIC:  Medical Oncology/Hematology  PCP:  Orpah Greek, MD [address] [place] [address] [phone]   REASON FOR VISIT:  Small lymphocytic lymphoma.  CURRENT THERAPY: Work-up in progress.    INTERVAL HISTORY:  Patrick Rubio 58 y.o. male seen for follow-up of SLL.  He did not gain any weight since last visit.  However he reports improvement in the right upper quadrant pain since gallbladder resection.  Appetite has not improved much.  It is reported at 50%.  Energy levels are 75%.  No pains reported.  He reportedly had nausea this morning.  He thinks it is coming from acyclovir.  Denies any fevers or night sweats.    REVIEW OF SYSTEMS:  Review of Systems  Constitutional: Positive for unexpected weight change.  Gastrointestinal: Positive for nausea.  All other systems reviewed and are negative.    PAST MEDICAL/SURGICAL HISTORY:  Past Medical History:  Diagnosis Date  . CAD (coronary artery disease)   . GERD (gastroesophageal reflux disease)   . HTN (hypertension)   . Hypercholesterolemia   . MI (myocardial infarction) ([place]) 2003   Past Surgical History:  Procedure Laterality Date  . AXILLARY LYMPH NODE BIOPSY Left 04/27/2019   Procedure: AXILLARY LYMPH NODE BIOPSY;  Surgeon: Aviva Signs, MD;  Location: [facility] ORS;  Service: General;  Laterality: Left;  . BIOPSY  11/26/2018   Procedure: BIOPSY;  Surgeon: Daneil Dolin, MD;  Location: [facility] ENDO SUITE;  Service: Endoscopy;;  . CHOLECYSTECTOMY N/A 05/18/2019   Procedure: LAPAROSCOPIC CHOLECYSTECTOMY;  Surgeon: Aviva Signs, MD;  Location: [facility] ORS;  Service: General;  Laterality: N/A;  . CORONARY ANGIOPLASTY WITH STENT PLACEMENT     2003  . ESOPHAGOGASTRODUODENOSCOPY N/A 11/26/2018   mild erosive reflux esophagitis, gastric erythema. Negative H.pylori   . INGUINAL HERNIA REPAIR Right 04/27/2019   Procedure: HERNIA REPAIR INGUINAL ADULT;   Surgeon: Aviva Signs, MD;  Location: [facility] ORS;  Service: General;  Laterality: Right;  . KNEE SURGERY Right   . lumbar fusion with cage  07/2017  . NASAL SINUS SURGERY    . TONSILLECTOMY AND ADENOIDECTOMY       SOCIAL HISTORY:  Social History   Socioeconomic History  . Marital status: Divorced    Spouse name: Not on file  . Number of children: 2  . Years of education: Not on file  . Highest education level: Not on file  Occupational History  . Occupation: employed    Comment: part time at [employer]  . Financial resource strain: Not hard at all  . Food insecurity    Worry: Never true    Inability: Never true  . Transportation needs    Medical: No    Non-medical: No  Tobacco Use  . Smoking status: Current Every Day Smoker    Packs/day: 0.50    Years: 25.00    Pack years: 12.50    Types: Cigarettes  . Smokeless tobacco: Never Used  Substance and Sexual Activity  . Alcohol use: Yes    Comment: occ  . Drug use: Never  . Sexual activity: Yes  Lifestyle  . Physical activity    Days per week: 4 days    Minutes per session: 60 min  . Stress: Not at all  Relationships  . Social connections    Talks on phone: More than three times a week    Gets together: More than three  times a week    Attends religious service: More than 4 times per year    Active member of club or organization: Yes    Attends meetings of clubs or organizations: 1 to 4 times per year    Relationship status: Divorced  . Intimate partner violence    Fear of current or ex partner: No    Emotionally abused: No    Physically abused: No    Forced sexual activity: No  Other Topics Concern  . Not on file  Social History Narrative  . Not on file    FAMILY HISTORY:  Family History  Problem Relation Age of Onset  . Colon polyps Father        thinks he may have had polyps, possibly in his 18s.   [name] Heart attack Father   . Dementia Mother   . Hypotension Mother   . Stroke Sister   .  Diabetes Maternal Grandmother   . Stroke Maternal Grandfather   . Cancer Paternal Grandmother   . Congestive Heart Failure Paternal Grandfather   . Kidney cancer Daughter   . Colon cancer Neg Hx   . Pancreatic cancer Neg Hx   . Liver disease Neg Hx     CURRENT MEDICATIONS:  Outpatient Encounter Medications as of 06/09/2019  Medication Sig  . acyclovir (ZOVIRAX) 400 MG tablet Take 1 tablet (400 mg total) by mouth 2 (two) times daily.  [name] allopurinol (ZYLOPRIM) 300 MG tablet Take 1 tablet (300 mg total) by mouth daily.  [name] aspirin EC 81 MG tablet Take 81 mg by mouth 3 (three) times a week. Pt takes M,W,F  . cimetidine (TAGAMET) 200 MG tablet Take 200 mg by mouth 2 (two) times daily.  . clopidogrel (PLAVIX) 75 MG tablet Take 75 mg by mouth daily.   [name] escitalopram (LEXAPRO) 20 MG tablet Take 20 mg by mouth daily.   [name] gabapentin (NEURONTIN) 100 MG capsule Take 100 mg by mouth 2 (two) times daily as needed (pain).   [name] lisinopril (PRINIVIL,ZESTRIL) 10 MG tablet Take 10 mg by mouth daily.   [name] obinutuzumab in sodium chloride 0.9 % 250 mL Inject into the vein.  . pantoprazole (PROTONIX) 40 MG tablet Take 40 mg by mouth 2 (two) times daily.  . simvastatin (ZOCOR) 40 MG tablet Take 40 mg by mouth every other day.   . sulfamethoxazole-trimethoprim (BACTRIM DS) 800-160 MG tablet Take 1 tablet by mouth 3 (three) times a week.  . [DISCONTINUED] acyclovir (ZOVIRAX) 400 MG tablet Take 1 tablet (400 mg total) by mouth daily.  . [DISCONTINUED] acyclovir (ZOVIRAX) 400 MG tablet Take 1 tablet (400 mg total) by mouth 2 (two) times daily.  . [DISCONTINUED] venetoclax 10 & 50 & 100 MG TBPK Take 20 mg by mouth daily for 7 days, THEN 50 mg daily for 7 days, THEN 100 mg daily for 7 days, THEN 200 mg daily for 7 days, THEN 400 mg daily for 7 days. 20 mg once daily during week 1, 50 mg once daily during week 2, 100 mg once daily during week 3, 200 mg once daily during week 4, 400 mg once daily during week 5..  .  baclofen (LIORESAL) 10 MG tablet Take 10 mg by mouth 2 (two) times daily as needed for muscle spasms.   . naproxen sodium (ALEVE) 220 MG tablet Take 220-440 mg by mouth 2 (two) times daily as needed (pain).   [name] oxyCODONE-acetaminophen (PERCOCET) 7.5-325 MG tablet Take 1 tablet by mouth every 6 (  six) hours as needed. (Patient not taking: Reported on 06/09/2019)   No facility-administered encounter medications on file as of 06/09/2019.     ALLERGIES:  No Known Allergies   PHYSICAL EXAM:  ECOG Performance status: 1  Vitals:   06/09/19 0817  BP: 125/73  Pulse: 61  Resp: 18  Temp: (!) 97.5 F (36.4 C)  SpO2: 98%   Filed Weights   06/09/19 0817  Weight: 188 lb 12.8 oz (85.6 kg)    Physical Exam Vitals signs reviewed.  Constitutional:      Appearance: Normal appearance.  Cardiovascular:     Rate and Rhythm: Normal rate and regular rhythm.     Heart sounds: Normal heart sounds.  Pulmonary:     Effort: Pulmonary effort is normal.     Breath sounds: Normal breath sounds.  Abdominal:     General: There is no distension.     Palpations: Abdomen is soft. There is no mass.  Musculoskeletal:        General: No swelling.  Skin:    General: Skin is warm.  Neurological:     General: No focal deficit present.     Mental Status: He is alert and oriented to person, place, and time.  Psychiatric:        Mood and Affect: Mood normal.        Behavior: Behavior normal.    Left axillary lymph node palpable.  LABORATORY DATA:  I have reviewed the labs as listed.  CBC    Component Value Date/Time   WBC 8.4 06/04/2019 0846   RBC 4.68 06/04/2019 0846   HGB 13.5 06/04/2019 0846   HCT 42.5 06/04/2019 0846   PLT 227 06/04/2019 0846   MCV 90.8 06/04/2019 0846   MCH 28.8 06/04/2019 0846   MCHC 31.8 06/04/2019 0846   RDW 13.4 06/04/2019 0846   LYMPHSABS 3.0 06/04/2019 0846   MONOABS 0.6 06/04/2019 0846   EOSABS 0.5 06/04/2019 0846   BASOSABS 0.1 06/04/2019 0846   CMP Latest Ref  Rng & Units 06/04/2019 03/12/2019  Glucose 70 - 99 mg/dL 70 75  BUN 6 - 20 mg/dL 16 17  Creatinine 0.61 - 1.24 mg/dL 0.77 0.80  Sodium 135 - 145 mmol/L 140 136  Potassium 3.5 - 5.1 mmol/L 4.4 3.8  Chloride 98 - 111 mmol/L 107 105  CO2 22 - 32 mmol/L 26 24  Calcium 8.9 - 10.3 mg/dL 9.0 8.6(L)  Total Protein 6.5 - 8.1 g/dL 6.7 6.4(L)  Total Bilirubin 0.3 - 1.2 mg/dL 0.8 0.9  Alkaline Phos 38 - 126 U/L 78 52  AST 15 - 41 U/L 17 15  ALT 0 - 44 U/L 14 11       DIAGNOSTIC IMAGING:  I have independently reviewed the scans and discussed with the patient.    ASSESSMENT & PLAN:   Small lymphocytic lymphoma ([place]) 1.  Clinical stage IIIb small lymphocytic lymphoma: - 56 pound weight loss in the last 1 and half year.  No fevers or night sweats. - Right upper quadrant pain for the past 4 months, worse on eating and better on lying on left side. -PET/CT scan on 04/13/2019 showed pathologically enlarged mesenteric and retroperitoneal adenopathy.  SUV ranged from 2.6-3.2.  Left external iliac lymph node 0.9 cm, SUV 2.  1.1 cm left axillary lymph node, SUV 2.1.  Accentuated activity in the left sternoclavicular joint, maximum SUV 5.3, likely secondary to mild arthropathy. - Left axillary lymph node biopsy on 04/27/2019 consistent with small lymphocytic  lymphoma. -We discussed the results of bone marrow biopsy which showed involvement by the lymphoma.  Chromosome analysis was 46, XY.  CLL FISH panel showed trisomy 12 which portends neutral prognosis. -T p53 mutation was not detected. -I GVH mutation analysis could not be reported, as analysis did not yield interpretable result.  Repeat sampling was recommended. -We will try to send IGH mutation on the pathology block. -We talked about starting him on therapy with venetoclax and obinutuzumab.  We will add venetoclax during cycle 2.  Today he will proceed with obinutuzumab test dose 100 mg.  We talked about various side effects including anaphylactic  reactions.  We will premedicate him very well.  I will plan to repeat tumor lysis labs tomorrow and see if he needs rasburicase. -I will see him back prior to date of therapy.  2.  Weight loss: -He had 56 pound weight loss in the last 1 and half year. -He had gallbladder resected on 05/18/2019 which showed chronic cholecystitis.  3.  TLS prophylaxis: -He is continuing allopurinol 300 mg daily.  4.  Infection prophylaxis: -He is continuing acyclovir 400 mg twice daily.  He will continue Bactrim 3 times a week.   Total time spent is 40 minutes with more than 50% of the time spent face-to-face discussing treatment plan, counseling and coordination of care.  Orders placed this encounter:  Orders Placed This Encounter  Procedures  . CBC with Differential/Platelet  . Comprehensive metabolic panel  . Magnesium  . Phosphorus  . Uric acid      Derek Jack, MD [place] [phone]

## 2019-06-09 NOTE — Progress Notes (Signed)
I emailed pathology at [facility] and requested IGVH testing to be done on Accession: [id number].

## 2019-06-09 NOTE — Telephone Encounter (Addendum)
Oral Oncology Pharmacy Student Encounter  Received new prescription for Venetoclax for the treatment of SLL in conjunction with obinutuzumab, planned duration 1 year.  Labs from 06/04/2019 assessed, no relevant abnormalities. Prescription dose and frequency assessed.   Current medication list in Epic reviewed, no DDIs with venetoclax identified.  Prescription has been e-scribed to the [facility] for benefits analysis and approval.  Oral Oncology Clinic will continue to follow for insurance authorization, copayment issues, initial counseling and start date.  Vallery Sa, PharmD Candidate  [facility] Oral [facility] [phone]  06/09/2019 11:11 AM

## 2019-06-09 NOTE — Telephone Encounter (Signed)
Oral Oncology Pharmacist Encounter   Prior Authorization for Venetoclax has been approved.     PA# [id number] Effective dates: 05/10/2019 through 06/08/2022  Copay $0   Oral Oncology Clinic will continue to follow.   Darl Pikes, PharmD, BCPS. BCOP Hematology/Oncology Clinical Pharmacist [facility] Oral [facility] [phone]  06/09/2019 1:51 PM

## 2019-06-09 NOTE — Assessment & Plan Note (Addendum)
1.  Clinical stage IIIb small lymphocytic lymphoma: - 56 pound weight loss in the last 1 and half year.  No fevers or night sweats. - Right upper quadrant pain for the past 4 months, worse on eating and better on lying on left side. -PET/CT scan on 04/13/2019 showed pathologically enlarged mesenteric and retroperitoneal adenopathy.  SUV ranged from 2.6-3.2.  Left external iliac lymph node 0.9 cm, SUV 2.  1.1 cm left axillary lymph node, SUV 2.1.  Accentuated activity in the left sternoclavicular joint, maximum SUV 5.3, likely secondary to mild arthropathy. - Left axillary lymph node biopsy on 04/27/2019 consistent with small lymphocytic lymphoma. -We discussed the results of bone marrow biopsy which showed involvement by the lymphoma.  Chromosome analysis was 46, XY.  CLL FISH panel showed trisomy 12 which portends neutral prognosis. -T p53 mutation was not detected. -I GVH mutation analysis could not be reported, as analysis did not yield interpretable result.  Repeat sampling was recommended. -We will try to send IGH mutation on the pathology block. -We talked about starting him on therapy with venetoclax and obinutuzumab.  We will add venetoclax during cycle 2.  Today he will proceed with obinutuzumab test dose 100 mg.  We talked about various side effects including anaphylactic reactions.  We will premedicate him very well.  I will plan to repeat tumor lysis labs tomorrow and see if he needs rasburicase. -I will see him back prior to date of therapy.  2.  Weight loss: -He had 56 pound weight loss in the last 1 and half year. -He had gallbladder resected on 05/18/2019 which showed chronic cholecystitis.  3.  TLS prophylaxis: -He is continuing allopurinol 300 mg daily.  4.  Infection prophylaxis: -He is continuing acyclovir 400 mg twice daily.  He will continue Bactrim 3 times a week. 

## 2019-06-09 NOTE — Telephone Encounter (Signed)
Oral Oncology Pharmacist Encounter   Received notification from [employer] that prior authorization for Venetoclax is required.   PA submitted on CMM Key [id number] Status is pending   Oral Oncology Clinic will continue to follow.   Darl Pikes, PharmD, BCPS, [facility] Hematology/Oncology Clinical Pharmacist [facility] [phone]  06/09/2019 1:48 PM

## 2019-06-10 ENCOUNTER — Inpatient Hospital Stay (HOSPITAL_COMMUNITY): Payer: 59

## 2019-06-10 VITALS — BP 109/61 | HR 58 | Temp 97.6°F | Resp 18

## 2019-06-10 DIAGNOSIS — C8300 Small cell B-cell lymphoma, unspecified site: Secondary | ICD-10-CM

## 2019-06-10 DIAGNOSIS — C9110 Chronic lymphocytic leukemia of B-cell type not having achieved remission: Secondary | ICD-10-CM | POA: Diagnosis not present

## 2019-06-10 LAB — CBC WITH DIFFERENTIAL/PLATELET
Abs Immature Granulocytes: 0.04 10*3/uL (ref 0.00–0.07)
Basophils Absolute: 0 10*3/uL (ref 0.0–0.1)
Basophils Relative: 0 %
Eosinophils Absolute: 0 10*3/uL (ref 0.0–0.5)
Eosinophils Relative: 0 %
HCT: 36.8 % — ABNORMAL LOW (ref 39.0–52.0)
Hemoglobin: 12.1 g/dL — ABNORMAL LOW (ref 13.0–17.0)
Immature Granulocytes: 0 %
Lymphocytes Relative: 4 %
Lymphs Abs: 0.5 10*3/uL — ABNORMAL LOW (ref 0.7–4.0)
MCH: 29.4 pg (ref 26.0–34.0)
MCHC: 32.9 g/dL (ref 30.0–36.0)
MCV: 89.3 fL (ref 80.0–100.0)
Monocytes Absolute: 0.3 10*3/uL (ref 0.1–1.0)
Monocytes Relative: 2 %
Neutro Abs: 10.8 10*3/uL — ABNORMAL HIGH (ref 1.7–7.7)
Neutrophils Relative %: 94 %
Platelets: 205 10*3/uL (ref 150–400)
RBC: 4.12 MIL/uL — ABNORMAL LOW (ref 4.22–5.81)
RDW: 13.2 % (ref 11.5–15.5)
WBC: 11.6 10*3/uL — ABNORMAL HIGH (ref 4.0–10.5)
nRBC: 0 % (ref 0.0–0.2)

## 2019-06-10 LAB — COMPREHENSIVE METABOLIC PANEL
ALT: 13 U/L (ref 0–44)
AST: 20 U/L (ref 15–41)
Albumin: 3.7 g/dL (ref 3.5–5.0)
Alkaline Phosphatase: 68 U/L (ref 38–126)
Anion gap: 10 (ref 5–15)
BUN: 15 mg/dL (ref 6–20)
CO2: 22 mmol/L (ref 22–32)
Calcium: 9 mg/dL (ref 8.9–10.3)
Chloride: 107 mmol/L (ref 98–111)
Creatinine, Ser: 0.83 mg/dL (ref 0.61–1.24)
GFR calc Af Amer: >60 mL/min (ref >60)
GFR calc non Af Amer: >60 mL/min (ref >60)
Glucose, Bld: 134 mg/dL — ABNORMAL HIGH (ref 70–99)
Potassium: 3.6 mmol/L (ref 3.5–5.1)
Sodium: 139 mmol/L (ref 135–145)
Total Bilirubin: 0.6 mg/dL (ref 0.3–1.2)
Total Protein: 6.1 g/dL — ABNORMAL LOW (ref 6.5–8.1)

## 2019-06-10 LAB — PHOSPHORUS: Phosphorus: 2.8 mg/dL (ref 2.5–4.6)

## 2019-06-10 LAB — URIC ACID: Uric Acid, Serum: 4.4 mg/dL (ref 3.7–8.6)

## 2019-06-10 LAB — MAGNESIUM: Magnesium: 2 mg/dL (ref 1.7–2.4)

## 2019-06-10 MED ORDER — HYDROCODONE-ACETAMINOPHEN 5-325 MG PO TABS
ORAL_TABLET | ORAL | Status: AC
  Filled 2019-06-10: qty 1

## 2019-06-10 MED ORDER — HYDROCODONE-ACETAMINOPHEN 5-325 MG PO TABS
1.0000 | ORAL_TABLET | Freq: Once | ORAL | Status: AC
  Administered 2019-06-10: 1 via ORAL

## 2019-06-10 MED ORDER — SODIUM CHLORIDE 0.9 % IV SOLN
Freq: Once | INTRAVENOUS | Status: AC
  Administered 2019-06-10: 09:00:00 via INTRAVENOUS

## 2019-06-10 MED ORDER — SODIUM CHLORIDE 0.9 % IV SOLN
20.0000 mg | Freq: Once | INTRAVENOUS | Status: AC
  Administered 2019-06-10: 20 mg via INTRAVENOUS
  Filled 2019-06-10: qty 20

## 2019-06-10 MED ORDER — OXYCODONE-ACETAMINOPHEN 5-325 MG PO TABS: 1.0000 | ORAL_TABLET | Freq: Once | ORAL | Status: DC

## 2019-06-10 MED ORDER — ACETAMINOPHEN 325 MG PO TABS
650.0000 mg | ORAL_TABLET | Freq: Once | ORAL | Status: AC
  Administered 2019-06-10: 650 mg via ORAL
  Filled 2019-06-10: qty 2

## 2019-06-10 MED ORDER — SODIUM CHLORIDE 0.9 % IV SOLN
900.0000 mg | Freq: Once | INTRAVENOUS | Status: AC
  Administered 2019-06-10: 900 mg via INTRAVENOUS
  Filled 2019-06-10: qty 36

## 2019-06-10 MED ORDER — FAMOTIDINE IN NACL 20-0.9 MG/50ML-% IV SOLN
20.0000 mg | Freq: Once | INTRAVENOUS | Status: AC
  Administered 2019-06-10: 20 mg via INTRAVENOUS
  Filled 2019-06-10: qty 50

## 2019-06-10 MED ORDER — DIPHENHYDRAMINE HCL 50 MG/ML IJ SOLN
50.0000 mg | Freq: Once | INTRAMUSCULAR | Status: AC
  Administered 2019-06-10: 50 mg via INTRAVENOUS
  Filled 2019-06-10: qty 1

## 2019-06-10 MED ORDER — HYDROCODONE-ACETAMINOPHEN 5-325 MG PO TABS
1.0000 | ORAL_TABLET | Freq: Once | ORAL | Status: AC
  Administered 2019-06-10: 1 via ORAL
  Filled 2019-06-10: qty 1

## 2019-06-10 NOTE — Progress Notes (Signed)
Labs reviewed with MD today, will recheck his uric acid level on Friday per MD. Proceed with treatment today , no additional orders at this time.  1000-MD notified that patient still complaining of headache, prior pain medication did not help at all per patient. Will give additional pain medication per order.   Treatment given per orders. Patient tolerated it well without problems. Vitals stable and discharged home from clinic ambulatory. Follow up as scheduled.

## 2019-06-10 NOTE — Patient Instructions (Signed)

## 2019-06-10 NOTE — Telephone Encounter (Signed)
Attempted to call patient for venetoclax education. Left voicemail.  Vallery Sa, PharmD Candidate 06/10/2019

## 2019-06-12 ENCOUNTER — Other Ambulatory Visit: Payer: Self-pay

## 2019-06-12 ENCOUNTER — Other Ambulatory Visit (HOSPITAL_COMMUNITY): Payer: Self-pay | Admitting: Nurse Practitioner

## 2019-06-12 ENCOUNTER — Encounter (HOSPITAL_COMMUNITY): Payer: Self-pay | Admitting: *Deleted

## 2019-06-12 ENCOUNTER — Inpatient Hospital Stay (HOSPITAL_COMMUNITY): Payer: 59

## 2019-06-12 DIAGNOSIS — C9110 Chronic lymphocytic leukemia of B-cell type not having achieved remission: Secondary | ICD-10-CM | POA: Diagnosis not present

## 2019-06-12 DIAGNOSIS — C8300 Small cell B-cell lymphoma, unspecified site: Secondary | ICD-10-CM

## 2019-06-12 LAB — CBC WITH DIFFERENTIAL/PLATELET
Abs Immature Granulocytes: 0.01 10*3/uL (ref 0.00–0.07)
Basophils Absolute: 0.1 10*3/uL (ref 0.0–0.1)
Basophils Relative: 1 %
Eosinophils Absolute: 0.1 10*3/uL (ref 0.0–0.5)
Eosinophils Relative: 2 %
HCT: 40.9 % (ref 39.0–52.0)
Hemoglobin: 13.1 g/dL (ref 13.0–17.0)
Immature Granulocytes: 0 %
Lymphocytes Relative: 36 %
Lymphs Abs: 1.9 10*3/uL (ref 0.7–4.0)
MCH: 28.9 pg (ref 26.0–34.0)
MCHC: 32 g/dL (ref 30.0–36.0)
MCV: 90.1 fL (ref 80.0–100.0)
Monocytes Absolute: 0.5 10*3/uL (ref 0.1–1.0)
Monocytes Relative: 10 %
Neutro Abs: 2.7 10*3/uL (ref 1.7–7.7)
Neutrophils Relative %: 51 %
Platelets: 175 10*3/uL (ref 150–400)
RBC: 4.54 MIL/uL (ref 4.22–5.81)
RDW: 13.7 % (ref 11.5–15.5)
WBC: 5.4 10*3/uL (ref 4.0–10.5)
nRBC: 0 % (ref 0.0–0.2)

## 2019-06-12 LAB — COMPREHENSIVE METABOLIC PANEL
ALT: 14 U/L (ref 0–44)
AST: 18 U/L (ref 15–41)
Albumin: 3.5 g/dL (ref 3.5–5.0)
Alkaline Phosphatase: 68 U/L (ref 38–126)
Anion gap: 9 (ref 5–15)
BUN: 18 mg/dL (ref 6–20)
CO2: 20 mmol/L — ABNORMAL LOW (ref 22–32)
Calcium: 8.5 mg/dL — ABNORMAL LOW (ref 8.9–10.3)
Chloride: 110 mmol/L (ref 98–111)
Creatinine, Ser: 0.92 mg/dL (ref 0.61–1.24)
GFR calc Af Amer: >60 mL/min (ref >60)
GFR calc non Af Amer: >60 mL/min (ref >60)
Glucose, Bld: 106 mg/dL — ABNORMAL HIGH (ref 70–99)
Potassium: 3.2 mmol/L — ABNORMAL LOW (ref 3.5–5.1)
Sodium: 139 mmol/L (ref 135–145)
Total Bilirubin: 0.5 mg/dL (ref 0.3–1.2)
Total Protein: 6.2 g/dL — ABNORMAL LOW (ref 6.5–8.1)

## 2019-06-12 LAB — URIC ACID: Uric Acid, Serum: 4.4 mg/dL (ref 3.7–8.6)

## 2019-06-12 LAB — PHOSPHORUS: Phosphorus: 3.4 mg/dL (ref 2.5–4.6)

## 2019-06-12 LAB — MAGNESIUM: Magnesium: 1.9 mg/dL (ref 1.7–2.4)

## 2019-06-12 NOTE — Progress Notes (Signed)
Per Dr. Tresa Moore with pathology, the [facility] can not be done on the biopsy as it is already set in paraffin.  She said she could look at potential peripheral blood.  I talked with Dr. Delton Coombes and since patient is already on therapy we will cancel the order.

## 2019-06-16 ENCOUNTER — Other Ambulatory Visit: Payer: Self-pay

## 2019-06-16 ENCOUNTER — Encounter (HOSPITAL_COMMUNITY): Payer: Self-pay | Admitting: Hematology

## 2019-06-16 ENCOUNTER — Inpatient Hospital Stay (HOSPITAL_COMMUNITY): Payer: 59

## 2019-06-16 ENCOUNTER — Inpatient Hospital Stay (HOSPITAL_BASED_OUTPATIENT_CLINIC_OR_DEPARTMENT_OTHER): Payer: 59 | Admitting: Hematology

## 2019-06-16 VITALS — BP 131/81 | HR 56 | Temp 97.6°F | Resp 18

## 2019-06-16 VITALS — BP 136/73 | HR 59 | Temp 97.5°F | Resp 20 | Wt 189.6 lb

## 2019-06-16 DIAGNOSIS — C8300 Small cell B-cell lymphoma, unspecified site: Secondary | ICD-10-CM

## 2019-06-16 DIAGNOSIS — C9110 Chronic lymphocytic leukemia of B-cell type not having achieved remission: Secondary | ICD-10-CM | POA: Diagnosis not present

## 2019-06-16 LAB — CBC WITH DIFFERENTIAL/PLATELET
Abs Immature Granulocytes: 0.03 10*3/uL (ref 0.00–0.07)
Basophils Absolute: 0.1 10*3/uL (ref 0.0–0.1)
Basophils Relative: 1 %
Eosinophils Absolute: 0.4 10*3/uL (ref 0.0–0.5)
Eosinophils Relative: 5 %
HCT: 40.3 % (ref 39.0–52.0)
Hemoglobin: 12.9 g/dL — ABNORMAL LOW (ref 13.0–17.0)
Immature Granulocytes: 0 %
Lymphocytes Relative: 25 %
Lymphs Abs: 1.8 10*3/uL (ref 0.7–4.0)
MCH: 28.7 pg (ref 26.0–34.0)
MCHC: 32 g/dL (ref 30.0–36.0)
MCV: 89.6 fL (ref 80.0–100.0)
Monocytes Absolute: 0.4 10*3/uL (ref 0.1–1.0)
Monocytes Relative: 6 %
Neutro Abs: 4.4 10*3/uL (ref 1.7–7.7)
Neutrophils Relative %: 63 %
Platelets: 206 10*3/uL (ref 150–400)
RBC: 4.5 MIL/uL (ref 4.22–5.81)
RDW: 13.6 % (ref 11.5–15.5)
WBC: 7 10*3/uL (ref 4.0–10.5)
nRBC: 0 % (ref 0.0–0.2)

## 2019-06-16 LAB — COMPREHENSIVE METABOLIC PANEL
ALT: 15 U/L (ref 0–44)
AST: 16 U/L (ref 15–41)
Albumin: 3.7 g/dL (ref 3.5–5.0)
Alkaline Phosphatase: 65 U/L (ref 38–126)
Anion gap: 7 (ref 5–15)
BUN: 14 mg/dL (ref 6–20)
CO2: 24 mmol/L (ref 22–32)
Calcium: 8.6 mg/dL — ABNORMAL LOW (ref 8.9–10.3)
Chloride: 106 mmol/L (ref 98–111)
Creatinine, Ser: 0.94 mg/dL (ref 0.61–1.24)
GFR calc Af Amer: >60 mL/min (ref >60)
GFR calc non Af Amer: >60 mL/min (ref >60)
Glucose, Bld: 127 mg/dL — ABNORMAL HIGH (ref 70–99)
Potassium: 4.2 mmol/L (ref 3.5–5.1)
Sodium: 137 mmol/L (ref 135–145)
Total Bilirubin: 0.5 mg/dL (ref 0.3–1.2)
Total Protein: 6.2 g/dL — ABNORMAL LOW (ref 6.5–8.1)

## 2019-06-16 LAB — URIC ACID: Uric Acid, Serum: 4.2 mg/dL (ref 3.7–8.6)

## 2019-06-16 LAB — PHOSPHORUS: Phosphorus: 3.3 mg/dL (ref 2.5–4.6)

## 2019-06-16 LAB — MAGNESIUM: Magnesium: 2.1 mg/dL (ref 1.7–2.4)

## 2019-06-16 MED ORDER — FAMOTIDINE IN NACL 20-0.9 MG/50ML-% IV SOLN
20.0000 mg | Freq: Once | INTRAVENOUS | Status: AC
  Administered 2019-06-16: 20 mg via INTRAVENOUS
  Filled 2019-06-16: qty 50

## 2019-06-16 MED ORDER — DIPHENHYDRAMINE HCL 50 MG/ML IJ SOLN
50.0000 mg | Freq: Once | INTRAMUSCULAR | Status: AC
  Administered 2019-06-16: 50 mg via INTRAVENOUS
  Filled 2019-06-16: qty 1

## 2019-06-16 MED ORDER — SODIUM CHLORIDE 0.9 % IV SOLN
20.0000 mg | Freq: Once | INTRAVENOUS | Status: AC
  Administered 2019-06-16: 20 mg via INTRAVENOUS
  Filled 2019-06-16: qty 20

## 2019-06-16 MED ORDER — SODIUM CHLORIDE 0.9 % IV SOLN
1000.0000 mg | Freq: Once | INTRAVENOUS | Status: AC
  Administered 2019-06-16: 1000 mg via INTRAVENOUS
  Filled 2019-06-16: qty 40

## 2019-06-16 MED ORDER — SODIUM CHLORIDE 0.9 % IV SOLN
Freq: Once | INTRAVENOUS | Status: AC
  Administered 2019-06-16: 09:00:00 via INTRAVENOUS

## 2019-06-16 MED ORDER — ACETAMINOPHEN 325 MG PO TABS
650.0000 mg | ORAL_TABLET | Freq: Once | ORAL | Status: AC
  Administered 2019-06-16: 650 mg via ORAL
  Filled 2019-06-16: qty 2

## 2019-06-16 NOTE — Assessment & Plan Note (Signed)
1.  Clinical stage IIIb small lymphocytic lymphoma: - 56 pound weight loss in the last 1 and half year.  No fevers or night sweats. - Right upper quadrant pain for the past 4 months, worse on eating and better on lying on left side. -PET/CT scan on 04/13/2019 showed pathologically enlarged mesenteric and retroperitoneal adenopathy.  SUV ranged from 2.6-3.2.  Left external iliac lymph node 0.9 cm, SUV 2.  1.1 cm left axillary lymph node, SUV 2.1.  Accentuated activity in the left sternoclavicular joint, maximum SUV 5.3, likely secondary to mild arthropathy. - Left axillary lymph node biopsy on 04/27/2019 consistent with small lymphocytic lymphoma. -We discussed the results of bone marrow biopsy which showed involvement by the lymphoma.  Chromosome analysis was 46, XY.  CLL FISH panel showed trisomy 12 which portends neutral prognosis. -T p53 mutation was not detected. -I GVH mutation analysis could not be reported, as analysis did not yield interpretable result.  Repeat sampling was recommended. -I GVH mutation cannot be done on the pathology block. -We started him on obinutuzumab on 06/09/2019.  He tolerated it very well.  He did develop headache during the second day.  There is responded to hydrocodone. -Today he does not report any major headache.  We reviewed his labs.  Potassium is 4.2.  Uric acid is also 4.2. -We will proceed with day 8 of obinutuzumab today.  I will reevaluate him in 1 week.  2.  Weight loss: -He had 56 pound weight loss in the last 1 and half year. -He had gallbladder resected on 05/18/2019 which showed chronic cholecystitis.  3.  TLS prophylaxis: -He will continue allopurinol 300 mg daily.  4.  Infection prophylaxis: -He is continuing Bactrim 3 times a week.  He has headaches after taking second dose of acyclovir.  We will cut back on acyclovir to once daily.

## 2019-06-16 NOTE — Patient Instructions (Addendum)
[facility] at [facility] Discharge Instructions  You were seen today by Dr. Delton Coombes. He went over your recent lab results. Start taking the Acyclovir once a day instead of twice to see if headaches get better. He will see you back in 1 week for labs, treatment and follow up.   Thank you for choosing [place] at [facility] to provide your oncology and hematology care.  To afford each patient quality time with our provider, please arrive at least 15 minutes before your scheduled appointment time.   If you have a lab appointment with the [facility] please come in thru the  Main Entrance and check in at the main information desk  You need to re-schedule your appointment should you arrive 10 or more minutes late.  We strive to give you quality time with our providers, and arriving late affects you and other patients whose appointments are after yours.  Also, if you no show three or more times for appointments you may be dismissed from the clinic at the providers discretion.     Again, thank you for choosing [facility].  Our hope is that these requests will decrease the amount of time that you wait before being seen by our physicians.       _____________________________________________________________  Should you have questions after your visit to [facility], please contact our office at [phone] between the hours of 8:00 a.m. and 4:30 p.m.  Voicemails left after 4:00 p.m. will not be returned until the following business day.  For prescription refill requests, have your pharmacy contact our office and allow 72 hours.    Cancer Center Support Programs:   > Cancer Support Group  2nd Tuesday of the month 1pm-2pm, Journey Room

## 2019-06-16 NOTE — Progress Notes (Signed)
[place], [place] [address]   CLINIC:  Medical Oncology/Hematology  PCP:  Orpah Greek, MD [address] [place] [address] [phone]   REASON FOR VISIT:  Small lymphocytic lymphoma.  CURRENT THERAPY: Work-up in progress.  INTERVAL HISTORY:  Mr. Tomko 58 y.o. male seen for follow-up of SLL.  He was started on obinutuzumab on 06/09/2019.  He reported headaches during day 2.  This subsided with hydrocodone.  However he has noticed headaches whenever he takes second pill of acyclovir daily.  He is also taking Bactrim 3 times a week.  He is taking allopurinol daily.  Denies any fevers, chills.  No abdominal pain reported.    REVIEW OF SYSTEMS:  Review of Systems  Neurological: Positive for headaches.  All other systems reviewed and are negative.    PAST MEDICAL/SURGICAL HISTORY:  Past Medical History:  Diagnosis Date  . CAD (coronary artery disease)   . GERD (gastroesophageal reflux disease)   . HTN (hypertension)   . Hypercholesterolemia   . MI (myocardial infarction) ([place]) 2003   Past Surgical History:  Procedure Laterality Date  . AXILLARY LYMPH NODE BIOPSY Left 04/27/2019   Procedure: AXILLARY LYMPH NODE BIOPSY;  Surgeon: Aviva Signs, MD;  Location: [facility] ORS;  Service: General;  Laterality: Left;  . BIOPSY  11/26/2018   Procedure: BIOPSY;  Surgeon: Daneil Dolin, MD;  Location: [facility] ENDO SUITE;  Service: Endoscopy;;  . CHOLECYSTECTOMY N/A 05/18/2019   Procedure: LAPAROSCOPIC CHOLECYSTECTOMY;  Surgeon: Aviva Signs, MD;  Location: [facility] ORS;  Service: General;  Laterality: N/A;  . CORONARY ANGIOPLASTY WITH STENT PLACEMENT     2003  . ESOPHAGOGASTRODUODENOSCOPY N/A 11/26/2018   mild erosive reflux esophagitis, gastric erythema. Negative H.pylori   . INGUINAL HERNIA REPAIR Right 04/27/2019   Procedure: HERNIA REPAIR INGUINAL ADULT;  Surgeon: Aviva Signs, MD;  Location: [facility];  Service: General;  Laterality: Right;   . KNEE SURGERY Right   . lumbar fusion with cage  07/2017  . NASAL SINUS SURGERY    . TONSILLECTOMY AND ADENOIDECTOMY       SOCIAL HISTORY:  Social History   Socioeconomic History  . Marital status: Divorced    Spouse name: Not on file  . Number of children: 2  . Years of education: Not on file  . Highest education level: Not on file  Occupational History  . Occupation: employed    Comment: part time at [employer]  . Financial resource strain: Not hard at all  . Food insecurity    Worry: Never true    Inability: Never true  . Transportation needs    Medical: No    Non-medical: No  Tobacco Use  . Smoking status: Current Every Day Smoker    Packs/day: 0.50    Years: 25.00    Pack years: 12.50    Types: Cigarettes  . Smokeless tobacco: Never Used  Substance and Sexual Activity  . Alcohol use: Yes    Comment: occ  . Drug use: Never  . Sexual activity: Yes  Lifestyle  . Physical activity    Days per week: 4 days    Minutes per session: 60 min  . Stress: Not at all  Relationships  . Social connections    Talks on phone: More than three times a week    Gets together: More than three times a week    Attends religious service: More than 4 times per year  Active member of club or organization: Yes    Attends meetings of clubs or organizations: 1 to 4 times per year    Relationship status: Divorced  . Intimate partner violence    Fear of current or ex partner: No    Emotionally abused: No    Physically abused: No    Forced sexual activity: No  Other Topics Concern  . Not on file  Social History Narrative  . Not on file    FAMILY HISTORY:  Family History  Problem Relation Age of Onset  . Colon polyps Father        thinks he may have had polyps, possibly in his 63s.   [name] Heart attack Father   . Dementia Mother   . Hypotension Mother   . Stroke Sister   . Diabetes Maternal Grandmother   . Stroke Maternal Grandfather   . Cancer Paternal  Grandmother   . Congestive Heart Failure Paternal Grandfather   . Kidney cancer Daughter   . Colon cancer Neg Hx   . Pancreatic cancer Neg Hx   . Liver disease Neg Hx     CURRENT MEDICATIONS:  Outpatient Encounter Medications as of 06/16/2019  Medication Sig  . acyclovir (ZOVIRAX) 400 MG tablet Take 1 tablet (400 mg total) by mouth 2 (two) times daily.  [name] allopurinol (ZYLOPRIM) 300 MG tablet Take 1 tablet (300 mg total) by mouth daily.  [name] aspirin EC 81 MG tablet Take 81 mg by mouth 3 (three) times a week. Pt takes M,W,F  . cimetidine (TAGAMET) 200 MG tablet Take 200 mg by mouth 2 (two) times daily.  . clopidogrel (PLAVIX) 75 MG tablet Take 75 mg by mouth daily.   [name] escitalopram (LEXAPRO) 20 MG tablet Take 20 mg by mouth daily.   [name] gabapentin (NEURONTIN) 100 MG capsule Take 100 mg by mouth 2 (two) times daily as needed (pain).   [name] lisinopril (PRINIVIL,ZESTRIL) 10 MG tablet Take 10 mg by mouth daily.   [name] obinutuzumab in sodium chloride 0.9 % 250 mL Inject into the vein.  . pantoprazole (PROTONIX) 40 MG tablet Take 40 mg by mouth 2 (two) times daily.  . simvastatin (ZOCOR) 40 MG tablet Take 40 mg by mouth every other day.   . sulfamethoxazole-trimethoprim (BACTRIM DS) 800-160 MG tablet Take 1 tablet by mouth 3 (three) times a week.  . venetoclax 10 & 50 & 100 MG TBPK Take 20 mg once daily during week 1, 50 mg once daily during week 2, 100 mg once daily during week 3, 200 mg once daily during week 4. Continue escalation as instructed  . baclofen (LIORESAL) 10 MG tablet Take 10 mg by mouth 2 (two) times daily as needed for muscle spasms.   . naproxen sodium (ALEVE) 220 MG tablet Take 220-440 mg by mouth 2 (two) times daily as needed (pain).   [name] oxyCODONE-acetaminophen (PERCOCET) 7.5-325 MG tablet Take 1 tablet by mouth every 6 (six) hours as needed. (Patient not taking: Reported on 06/09/2019)   No facility-administered encounter medications on file as of 06/16/2019.     ALLERGIES:  No  Known Allergies   PHYSICAL EXAM:  ECOG Performance status: 1  Vitals:   06/16/19 0834  BP: 136/73  Pulse: (!) 59  Resp: 20  Temp: (!) 97.5 F (36.4 C)  SpO2: 100%   Filed Weights   06/16/19 0834  Weight: 189 lb 9.6 oz (86 kg)    Physical Exam Vitals signs reviewed.  Constitutional:  Appearance: Normal appearance.  Cardiovascular:     Rate and Rhythm: Normal rate and regular rhythm.     Heart sounds: Normal heart sounds.  Pulmonary:     Effort: Pulmonary effort is normal.     Breath sounds: Normal breath sounds.  Abdominal:     General: There is no distension.     Palpations: Abdomen is soft. There is no mass.  Musculoskeletal:        General: No swelling.  Skin:    General: Skin is warm.  Neurological:     General: No focal deficit present.     Mental Status: He is alert and oriented to person, place, and time.  Psychiatric:        Mood and Affect: Mood normal.        Behavior: Behavior normal.    Left axillary lymph node palpable.  LABORATORY DATA:  I have reviewed the labs as listed.  CBC    Component Value Date/Time   WBC 7.0 06/16/2019 0804   RBC 4.50 06/16/2019 0804   HGB 12.9 (L) 06/16/2019 0804   HCT 40.3 06/16/2019 0804   PLT 206 06/16/2019 0804   MCV 89.6 06/16/2019 0804   MCH 28.7 06/16/2019 0804   MCHC 32.0 06/16/2019 0804   RDW 13.6 06/16/2019 0804   LYMPHSABS 1.8 06/16/2019 0804   MONOABS 0.4 06/16/2019 0804   EOSABS 0.4 06/16/2019 0804   BASOSABS 0.1 06/16/2019 0804   CMP Latest Ref Rng & Units 06/16/2019 06/12/2019 06/10/2019  Glucose 70 - 99 mg/dL 127(H) 106(H) 134(H)  BUN 6 - 20 mg/dL '14 18 15  '$ Creatinine 0.61 - 1.24 mg/dL 0.94 0.92 0.83  Sodium 135 - 145 mmol/L 137 139 139  Potassium 3.5 - 5.1 mmol/L 4.2 3.2(L) 3.6  Chloride 98 - 111 mmol/L 106 110 107  CO2 22 - 32 mmol/L 24 20(L) 22  Calcium 8.9 - 10.3 mg/dL 8.6(L) 8.5(L) 9.0  Total Protein 6.5 - 8.1 g/dL 6.2(L) 6.2(L) 6.1(L)  Total Bilirubin 0.3 - 1.2 mg/dL 0.5 0.5  0.6  Alkaline Phos 38 - 126 U/L 65 68 68  AST 15 - 41 U/L '16 18 20  '$ ALT 0 - 44 U/L '15 14 13       '$ DIAGNOSTIC IMAGING:  I have independently reviewed the scans and discussed with the patient.    ASSESSMENT & PLAN:   Small lymphocytic lymphoma ([place]) 1.  Clinical stage IIIb small lymphocytic lymphoma: - 56 pound weight loss in the last 1 and half year.  No fevers or night sweats. - Right upper quadrant pain for the past 4 months, worse on eating and better on lying on left side. -PET/CT scan on 04/13/2019 showed pathologically enlarged mesenteric and retroperitoneal adenopathy.  SUV ranged from 2.6-3.2.  Left external iliac lymph node 0.9 cm, SUV 2.  1.1 cm left axillary lymph node, SUV 2.1.  Accentuated activity in the left sternoclavicular joint, maximum SUV 5.3, likely secondary to mild arthropathy. - Left axillary lymph node biopsy on 04/27/2019 consistent with small lymphocytic lymphoma. -We discussed the results of bone marrow biopsy which showed involvement by the lymphoma.  Chromosome analysis was 46, XY.  CLL FISH panel showed trisomy 12 which portends neutral prognosis. -T p53 mutation was not detected. -I GVH mutation analysis could not be reported, as analysis did not yield interpretable result.  Repeat sampling was recommended. -I GVH mutation cannot be done on the pathology block. -We started him on obinutuzumab on 06/09/2019.  He tolerated it very  well.  He did develop headache during the second day.  There is responded to hydrocodone. -Today he does not report any major headache.  We reviewed his labs.  Potassium is 4.2.  Uric acid is also 4.2. -We will proceed with day 8 of obinutuzumab today.  I will reevaluate him in 1 week.  2.  Weight loss: -He had 56 pound weight loss in the last 1 and half year. -He had gallbladder resected on 05/18/2019 which showed chronic cholecystitis.  3.  TLS prophylaxis: -He will continue allopurinol 300 mg daily.  4.  Infection  prophylaxis: -He is continuing Bactrim 3 times a week.  He has headaches after taking second dose of acyclovir.  We will cut back on acyclovir to once daily.     Orders placed this encounter:  Orders Placed This Encounter  Procedures  . CBC with Differential/Platelet  . Comprehensive metabolic panel  . Magnesium  . Phosphorus  . Uric acid      Derek Jack, MD [place] [phone]

## 2019-06-16 NOTE — Patient Instructions (Signed)
[facility] Discharge Instructions for Patients Receiving Chemotherapy  Today you received the following chemotherapy agents   To help prevent nausea and vomiting after your treatment, we encourage you to take your nausea medication   If you develop nausea and vomiting that is not controlled by your nausea medication, call the clinic.   BELOW ARE SYMPTOMS THAT SHOULD BE REPORTED IMMEDIATELY:  *FEVER GREATER THAN 100.5 F  *CHILLS WITH OR WITHOUT FEVER  NAUSEA AND VOMITING THAT IS NOT CONTROLLED WITH YOUR NAUSEA MEDICATION  *UNUSUAL SHORTNESS OF BREATH  *UNUSUAL BRUISING OR BLEEDING  TENDERNESS IN MOUTH AND THROAT WITH OR WITHOUT PRESENCE OF ULCERS  *URINARY PROBLEMS  *BOWEL PROBLEMS  UNUSUAL RASH Items with * indicate a potential emergency and should be followed up as soon as possible.  Feel free to call the clinic should you have any questions or concerns. The clinic phone number is [phone].  Please show the CHEMO ALERT CARD at check-in to the Emergency Department and triage nurse.   

## 2019-06-16 NOTE — Progress Notes (Signed)
Labs reviewed with MD. Proceed with treatment today per MD.   Treatment given per orders. Patient tolerated it well without problems. Vitals stable and discharged home from clinic ambulatory. Follow up as scheduled.

## 2019-06-17 ENCOUNTER — Telehealth (HOSPITAL_COMMUNITY): Payer: Self-pay

## 2019-06-17 MED FILL — VENCLEXTA STARTING PACK: 10 & 50 & 1 | 28 days supply | Qty: 42 | Fill #0

## 2019-06-17 NOTE — Telephone Encounter (Signed)
Patient called today to let us know he had a major headache again last night after his treatment yesterday. He was instructed by Dr. Delton Coombes to only take one of the acyclovir pills and see if that would help. So he did this and still had the headache last night. He did take some vicodin throughout the night off and on and finally got relief by 730 am. He did report he felt diaphoretic and cold when he woke up at one time during the night. Reports no fever. Will notify MD and call patient back.   Per Dr. Delton Coombes, will instruct patient to discontinue the Acyclovir medication at this time to see if the headaches get better. Encouraged patient to continue to let us know is this helps.

## 2019-06-22 NOTE — Telephone Encounter (Signed)
Oral Chemotherapy Pharmacist Encounter   Reviewed note by pharmacy student Dorian Pod, agree with the venetoclax assessment. Patient will start venetoclax with cycle 2, labs obtained closer to that start date will be helpful for TLS monitoring purposes.  Darl Pikes, PharmD, BCPS, [facility] Hematology/Oncology Clinical Pharmacist [facility] [phone]  06/22/2019 10:56 AM

## 2019-06-23 ENCOUNTER — Encounter (HOSPITAL_COMMUNITY): Payer: Self-pay | Admitting: Hematology

## 2019-06-23 ENCOUNTER — Other Ambulatory Visit: Payer: Self-pay

## 2019-06-23 ENCOUNTER — Inpatient Hospital Stay (HOSPITAL_COMMUNITY): Payer: 59

## 2019-06-23 ENCOUNTER — Inpatient Hospital Stay (HOSPITAL_BASED_OUTPATIENT_CLINIC_OR_DEPARTMENT_OTHER): Payer: 59 | Admitting: Hematology

## 2019-06-23 VITALS — BP 126/71 | HR 66 | Temp 97.1°F | Resp 18

## 2019-06-23 VITALS — BP 123/75 | HR 63 | Temp 97.5°F | Resp 18 | Wt 189.8 lb

## 2019-06-23 DIAGNOSIS — C8300 Small cell B-cell lymphoma, unspecified site: Secondary | ICD-10-CM | POA: Diagnosis not present

## 2019-06-23 DIAGNOSIS — C9110 Chronic lymphocytic leukemia of B-cell type not having achieved remission: Secondary | ICD-10-CM | POA: Diagnosis not present

## 2019-06-23 LAB — CBC WITH DIFFERENTIAL/PLATELET
Abs Immature Granulocytes: 0.02 10*3/uL (ref 0.00–0.07)
Basophils Absolute: 0.1 10*3/uL (ref 0.0–0.1)
Basophils Relative: 1 %
Eosinophils Absolute: 0.4 10*3/uL (ref 0.0–0.5)
Eosinophils Relative: 5 %
HCT: 40.8 % (ref 39.0–52.0)
Hemoglobin: 13 g/dL (ref 13.0–17.0)
Immature Granulocytes: 0 %
Lymphocytes Relative: 27 %
Lymphs Abs: 2 10*3/uL (ref 0.7–4.0)
MCH: 28.6 pg (ref 26.0–34.0)
MCHC: 31.9 g/dL (ref 30.0–36.0)
MCV: 89.7 fL (ref 80.0–100.0)
Monocytes Absolute: 0.6 10*3/uL (ref 0.1–1.0)
Monocytes Relative: 8 %
Neutro Abs: 4.3 10*3/uL (ref 1.7–7.7)
Neutrophils Relative %: 59 %
Platelets: 217 10*3/uL (ref 150–400)
RBC: 4.55 MIL/uL (ref 4.22–5.81)
RDW: 13.5 % (ref 11.5–15.5)
WBC: 7.4 10*3/uL (ref 4.0–10.5)
nRBC: 0 % (ref 0.0–0.2)

## 2019-06-23 LAB — COMPREHENSIVE METABOLIC PANEL
ALT: 15 U/L (ref 0–44)
AST: 19 U/L (ref 15–41)
Albumin: 3.6 g/dL (ref 3.5–5.0)
Alkaline Phosphatase: 68 U/L (ref 38–126)
Anion gap: 6 (ref 5–15)
BUN: 17 mg/dL (ref 6–20)
CO2: 24 mmol/L (ref 22–32)
Calcium: 8.6 mg/dL — ABNORMAL LOW (ref 8.9–10.3)
Chloride: 108 mmol/L (ref 98–111)
Creatinine, Ser: 0.94 mg/dL (ref 0.61–1.24)
GFR calc Af Amer: >60 mL/min (ref >60)
GFR calc non Af Amer: >60 mL/min (ref >60)
Glucose, Bld: 68 mg/dL — ABNORMAL LOW (ref 70–99)
Potassium: 4.4 mmol/L (ref 3.5–5.1)
Sodium: 138 mmol/L (ref 135–145)
Total Bilirubin: 0.6 mg/dL (ref 0.3–1.2)
Total Protein: 6.5 g/dL (ref 6.5–8.1)

## 2019-06-23 LAB — PHOSPHORUS: Phosphorus: 3.5 mg/dL (ref 2.5–4.6)

## 2019-06-23 LAB — MAGNESIUM: Magnesium: 1.9 mg/dL (ref 1.7–2.4)

## 2019-06-23 LAB — URIC ACID: Uric Acid, Serum: 3.8 mg/dL (ref 3.7–8.6)

## 2019-06-23 MED ORDER — FAMOTIDINE IN NACL 20-0.9 MG/50ML-% IV SOLN
20.0000 mg | Freq: Once | INTRAVENOUS | Status: AC
  Administered 2019-06-23: 20 mg via INTRAVENOUS
  Filled 2019-06-23: qty 50

## 2019-06-23 MED ORDER — SODIUM CHLORIDE 0.9 % IV SOLN
Freq: Once | INTRAVENOUS | Status: AC
  Administered 2019-06-23: 09:00:00 via INTRAVENOUS

## 2019-06-23 MED ORDER — ACETAMINOPHEN 325 MG PO TABS
650.0000 mg | ORAL_TABLET | Freq: Once | ORAL | Status: AC
  Administered 2019-06-23: 650 mg via ORAL
  Filled 2019-06-23: qty 2

## 2019-06-23 MED ORDER — SODIUM CHLORIDE 0.9 % IV SOLN
1000.0000 mg | Freq: Once | INTRAVENOUS | Status: AC
  Administered 2019-06-23: 1000 mg via INTRAVENOUS
  Filled 2019-06-23: qty 40

## 2019-06-23 MED ORDER — SODIUM CHLORIDE 0.9 % IV SOLN
20.0000 mg | Freq: Once | INTRAVENOUS | Status: AC
  Administered 2019-06-23: 20 mg via INTRAVENOUS
  Filled 2019-06-23: qty 20

## 2019-06-23 MED ORDER — DIPHENHYDRAMINE HCL 50 MG/ML IJ SOLN
50.0000 mg | Freq: Once | INTRAMUSCULAR | Status: AC
  Administered 2019-06-23: 50 mg via INTRAVENOUS
  Filled 2019-06-23: qty 1

## 2019-06-23 NOTE — Assessment & Plan Note (Signed)
1.  Clinical stage IIIb small lymphocytic lymphoma: - 56 pound weight loss in the last 1 and half year.  No fevers or night sweats. - Right upper quadrant pain for the past 4 months, worse on eating and better on lying on left side. -PET/CT scan on 04/13/2019 showed pathologically enlarged mesenteric and retroperitoneal adenopathy.  SUV ranged from 2.6-3.2.  Left external iliac lymph node 0.9 cm, SUV 2.  1.1 cm left axillary lymph node, SUV 2.1.  Accentuated activity in the left sternoclavicular joint, maximum SUV 5.3, likely secondary to mild arthropathy. - Left axillary lymph node biopsy on 04/27/2019 consistent with small lymphocytic lymphoma. -We discussed the results of bone marrow biopsy which showed involvement by the lymphoma.  Chromosome analysis was 46, XY.  CLL FISH panel showed trisomy 12 which portends neutral prognosis. -T p53 mutation was not detected. -I GVH mutation analysis could not be reported, as analysis did not yield interpretable result.  Repeat sampling was recommended. -I GVH mutation cannot be done on the pathology block. -Obinutuzumab cycle 1 day 1 on 06/09/2019. -He reported headaches which start the next day after each infusion of obinutuzumab.  I have reviewed literature on obinutuzumab but could not find any reporting of headaches associated with it.  He is taking hydrocodone which is helping with the headache.  He does not require more than 1 or 2 tablets each time he gets infusion. -His left axillary lymph node has decreased in size.  I have reviewed his labs. -We will proceed with his day 15 treatment today.  I will see him back in 2 weeks to initiate cycle 2 every 28 days. -Plan to initiate venetoclax ramp-up dose at that time.   2.  Weight loss: -He had 56 pound weight loss in the last 1 and half year. -He had gallbladder resected on 05/18/2019 which showed chronic cholecystitis.  3.  TLS prophylaxis: -He will continue allopurinol 300 mg daily.  4.  Infection  prophylaxis: -He is taking Bactrim 3 times a week. -We have discontinued acyclovir secondary to possibility of headaches.  He will start back twice daily.  He will hold it if headaches come back.

## 2019-06-23 NOTE — Patient Instructions (Addendum)
[facility] at [facility] Discharge Instructions  You were seen today by Dr. Katragadda. He went over your recent lab results. He will see you back in 2 weeks for labs, treatment and follow up.   Thank you for choosing [facility] at [facility] to provide your oncology and hematology care.  To afford each patient quality time with our provider, please arrive at least 15 minutes before your scheduled appointment time.   If you have a lab appointment with the Cancer Center please come in thru the  Main Entrance and check in at the main information desk  You need to re-schedule your appointment should you arrive 10 or more minutes late.  We strive to give you quality time with our providers, and arriving late affects you and other patients whose appointments are after yours.  Also, if you no show three or more times for appointments you may be dismissed from the clinic at the providers discretion.     Again, thank you for choosing [facility].  Our hope is that these requests will decrease the amount of time that you wait before being seen by our physicians.       _____________________________________________________________  Should you have questions after your visit to [facility], please contact our office at [phone] between the hours of 8:00 a.m. and 4:30 p.m.  Voicemails left after 4:00 p.m. will not be returned until the following business day.  For prescription refill requests, have your pharmacy contact our office and allow 72 hours.    Cancer Center Support Programs:   > Cancer Support Group  2nd Tuesday of the month 1pm-2pm, Journey Room    

## 2019-06-23 NOTE — Patient Instructions (Signed)
[facility] Discharge Instructions for Patients Receiving Chemotherapy   Beginning January 23rd 2017 lab work for the Cancer Center will be done in the  Main lab at [place] on 1st floor. If you have a lab appointment with the Cancer Center please come in thru the  Main Entrance and check in at the main information desk   Today you received the following chemotherapy agents [place]va  To help prevent nausea and vomiting after your treatment, we encourage you to take your nausea medication   If you develop nausea and vomiting, or diarrhea that is not controlled by your medication, call the clinic.  The clinic phone number is [phone]. Office hours are Monday-Friday 8:30am-5:00pm.  BELOW ARE SYMPTOMS THAT SHOULD BE REPORTED IMMEDIATELY:  *FEVER GREATER THAN 101.0 F  *CHILLS WITH OR WITHOUT FEVER  NAUSEA AND VOMITING THAT IS NOT CONTROLLED WITH YOUR NAUSEA MEDICATION  *UNUSUAL SHORTNESS OF BREATH  *UNUSUAL BRUISING OR BLEEDING  TENDERNESS IN MOUTH AND THROAT WITH OR WITHOUT PRESENCE OF ULCERS  *URINARY PROBLEMS  *BOWEL PROBLEMS  UNUSUAL RASH Items with * indicate a potential emergency and should be followed up as soon as possible. If you have an emergency after office hours please contact your primary care physician or go to the nearest emergency department.  Please call the clinic during office hours if you have any questions or concerns.   You may also contact the Patient Navigator at [phone] should you have any questions or need assistance in obtaining follow up care.      Resources For Cancer Patients and their Caregivers ? American Cancer Society: Can assist with transportation, wigs, general needs, runs Look Good Feel Better.        1-888-227-6333 ? Cancer Care: Provides financial assistance, online support groups, medication/co-pay assistance.  1-800-813-HOPE (4673) ? [facility] Assists [place] cancer  patients and their families through emotional , educational and financial support.  336-427-4357 ? [place] DSS Where to apply for food stamps, Medicaid and utility assistance. 336-342-1394 ? [facility]: Transportation to medical appointments. 336-347-2287 ? Social Security Administration: May apply for disability if have a Stage IV cancer. 336-342-7796 1-800-772-1213 ? [place] Aging, Disability and Transit Services: Assists with nutrition, care and transit needs. 336-349-2343          

## 2019-06-23 NOTE — Progress Notes (Signed)
Pt presents today for treatment and follow up visit with Dr. Delton Coombes. MAR reviewed. Labs pending. Pt has no complaints of any changes since the last visit. Patient states no headache today at this time but states he had one last week on Wednesday after treatment.   10:37 Blood return noted and verified by 2 RN's. Barnetta Chapel Page RN and Surveyor, quantity. Prior to [facility] infusion.

## 2019-06-23 NOTE — Progress Notes (Signed)
[address], [place] [address]   CLINIC:  Medical Oncology/Hematology  PCP:  Orpah Greek, MD [address] [place] [address] [phone]   REASON FOR VISIT:  Small lymphocytic lymphoma.  CURRENT THERAPY: Work-up in progress.  INTERVAL HISTORY:  Patrick Rubio 58 y.o. male seen for follow-up of SLL.  He reported headaches 1 day after each treatment of obinutuzumab.  Appetite is 25%.  Energy levels are 75%.  Headaches usually last few hours and goes away with hydrocodone.  Denies any fevers or chills.  No vision changes.  He is continuing allopurinol.  He is also taking Bactrim 3 times a week.  We held acyclovir until suspicion that it was contributing to headaches.    REVIEW OF SYSTEMS:  Review of Systems  Neurological: Positive for headaches.  All other systems reviewed and are negative.    PAST MEDICAL/SURGICAL HISTORY:  Past Medical History:  Diagnosis Date  . CAD (coronary artery disease)   . GERD (gastroesophageal reflux disease)   . HTN (hypertension)   . Hypercholesterolemia   . MI (myocardial infarction) ([facility]) 2003   Past Surgical History:  Procedure Laterality Date  . AXILLARY LYMPH NODE BIOPSY Left 04/27/2019   Procedure: AXILLARY LYMPH NODE BIOPSY;  Surgeon: Aviva Signs, MD;  Location: [facility];  Service: General;  Laterality: Left;  . BIOPSY  11/26/2018   Procedure: BIOPSY;  Surgeon: Daneil Dolin, MD;  Location: [facility] SUITE;  Service: Endoscopy;;  . CHOLECYSTECTOMY N/A 05/18/2019   Procedure: LAPAROSCOPIC CHOLECYSTECTOMY;  Surgeon: Aviva Signs, MD;  Location: [facility];  Service: General;  Laterality: N/A;  . CORONARY ANGIOPLASTY WITH STENT PLACEMENT     2003  . ESOPHAGOGASTRODUODENOSCOPY N/A 11/26/2018   mild erosive reflux esophagitis, gastric erythema. Negative H.pylori   . INGUINAL HERNIA REPAIR Right 04/27/2019   Procedure: HERNIA REPAIR INGUINAL ADULT;  Surgeon: Aviva Signs, MD;  Location: [facility];   Service: General;  Laterality: Right;  . KNEE SURGERY Right   . lumbar fusion with cage  07/2017  . NASAL SINUS SURGERY    . TONSILLECTOMY AND ADENOIDECTOMY       SOCIAL HISTORY:  Social History   Socioeconomic History  . Marital status: Divorced    Spouse name: Not on file  . Number of children: 2  . Years of education: Not on file  . Highest education level: Not on file  Occupational History  . Occupation: employed    Comment: part time at [employer]  . Financial resource strain: Not hard at all  . Food insecurity    Worry: Never true    Inability: Never true  . Transportation needs    Medical: No    Non-medical: No  Tobacco Use  . Smoking status: Current Every Day Smoker    Packs/day: 0.50    Years: 25.00    Pack years: 12.50    Types: Cigarettes  . Smokeless tobacco: Never Used  Substance and Sexual Activity  . Alcohol use: Yes    Comment: occ  . Drug use: Never  . Sexual activity: Yes  Lifestyle  . Physical activity    Days per week: 4 days    Minutes per session: 60 min  . Stress: Not at all  Relationships  . Social connections    Talks on phone: More than three times a week    Gets together: More than three times a week    Attends religious  service: More than 4 times per year    Active member of club or organization: Yes    Attends meetings of clubs or organizations: 1 to 4 times per year    Relationship status: Divorced  . Intimate partner violence    Fear of current or ex partner: No    Emotionally abused: No    Physically abused: No    Forced sexual activity: No  Other Topics Concern  . Not on file  Social History Narrative  . Not on file    FAMILY HISTORY:  Family History  Problem Relation Age of Onset  . Colon polyps Father        thinks he may have had polyps, possibly in his 82s.   [name] Heart attack Father   . Dementia Mother   . Hypotension Mother   . Stroke Sister   . Diabetes Maternal Grandmother   . Stroke  Maternal Grandfather   . Cancer Paternal Grandmother   . Congestive Heart Failure Paternal Grandfather   . Kidney cancer Daughter   . Colon cancer Neg Hx   . Pancreatic cancer Neg Hx   . Liver disease Neg Hx     CURRENT MEDICATIONS:  Outpatient Encounter Medications as of 06/23/2019  Medication Sig  . acyclovir (ZOVIRAX) 400 MG tablet Take 1 tablet (400 mg total) by mouth 2 (two) times daily.  [name] allopurinol (ZYLOPRIM) 300 MG tablet Take 1 tablet (300 mg total) by mouth daily.  [name] aspirin EC 81 MG tablet Take 81 mg by mouth 3 (three) times a week. Pt takes M,W,F  . baclofen (LIORESAL) 10 MG tablet Take 10 mg by mouth 2 (two) times daily as needed for muscle spasms.   . cimetidine (TAGAMET) 200 MG tablet Take 200 mg by mouth as needed.   . clopidogrel (PLAVIX) 75 MG tablet Take 75 mg by mouth daily.   [name] escitalopram (LEXAPRO) 20 MG tablet Take 20 mg by mouth daily.   [name] gabapentin (NEURONTIN) 100 MG capsule Take 100 mg by mouth 2 (two) times daily as needed (pain).   [name] lisinopril (PRINIVIL,ZESTRIL) 10 MG tablet Take 10 mg by mouth daily.   . naproxen sodium (ALEVE) 220 MG tablet Take 220-440 mg by mouth 2 (two) times daily as needed (pain).   [name] obinutuzumab in sodium chloride 0.9 % 250 mL Inject into the vein.  [name] oxyCODONE-acetaminophen (PERCOCET) 7.5-325 MG tablet Take 1 tablet by mouth every 6 (six) hours as needed. (Patient not taking: Reported on 06/09/2019)  . pantoprazole (PROTONIX) 40 MG tablet Take 40 mg by mouth as needed.   . simvastatin (ZOCOR) 40 MG tablet Take 40 mg by mouth every other day.   . sulfamethoxazole-trimethoprim (BACTRIM DS) 800-160 MG tablet Take 1 tablet by mouth 3 (three) times a week.  . venetoclax 10 & 50 & 100 MG TBPK Take 20 mg once daily during week 1, 50 mg once daily during week 2, 100 mg once daily during week 3, 200 mg once daily during week 4. Continue escalation as instructed   No facility-administered encounter medications on file as of 06/23/2019.      ALLERGIES:  No Known Allergies   PHYSICAL EXAM:  ECOG Performance status: 1  Vitals:   06/23/19 0827  BP: 123/75  Pulse: 63  Resp: 18  Temp: (!) 97.5 F (36.4 C)  SpO2: 100%   Filed Weights   06/23/19 0827  Weight: 189 lb 12.8 oz (86.1 kg)    Physical Exam Vitals signs  reviewed.  Constitutional:      Appearance: Normal appearance.  Cardiovascular:     Rate and Rhythm: Normal rate and regular rhythm.     Heart sounds: Normal heart sounds.  Pulmonary:     Effort: Pulmonary effort is normal.     Breath sounds: Normal breath sounds.  Abdominal:     General: There is no distension.     Palpations: Abdomen is soft. There is no mass.  Musculoskeletal:        General: No swelling.  Skin:    General: Skin is warm.  Neurological:     General: No focal deficit present.     Mental Status: He is alert and oriented to person, place, and time.  Psychiatric:        Mood and Affect: Mood normal.        Behavior: Behavior normal.    Left axillary lymph node palpable.  LABORATORY DATA:  I have reviewed the labs as listed.  CBC    Component Value Date/Time   WBC 7.4 06/23/2019 0809   RBC 4.55 06/23/2019 0809   HGB 13.0 06/23/2019 0809   HCT 40.8 06/23/2019 0809   PLT 217 06/23/2019 0809   MCV 89.7 06/23/2019 0809   MCH 28.6 06/23/2019 0809   MCHC 31.9 06/23/2019 0809   RDW 13.5 06/23/2019 0809   LYMPHSABS 2.0 06/23/2019 0809   MONOABS 0.6 06/23/2019 0809   EOSABS 0.4 06/23/2019 0809   BASOSABS 0.1 06/23/2019 0809   CMP Latest Ref Rng & Units 06/23/2019 06/16/2019 06/12/2019  Glucose 70 - 99 mg/dL 68(L) 127(H) 106(H)  BUN 6 - 20 mg/dL _0 Creatinine 0.61 - 1.24 mg/dL 0.94 0.94 0.92  Sodium 135 - 145 mmol/L 138 137 139  Potassium 3.5 - 5.1 mmol/L 4.4 4.2 3.2(L)  Chloride 98 - 111 mmol/L 108 106 110  CO2 22 - 32 mmol/L 24 24 20(L)  Calcium 8.9 - 10.3 mg/dL 8.6(L) 8.6(L) 8.5(L)  Total Protein 6.5 - 8.1 g/dL 6.5 6.2(L) 6.2(L)  Total Bilirubin 0.3 - 1.2  mg/dL 0.6 0.5 0.5  Alkaline Phos 38 - 126 U/L 68 65 68  AST 15 - 41 U/L _1 ALT 0 - 44 U/L _2 DIAGNOSTIC IMAGING:  I have independently reviewed the scans and discussed with the patient.    ASSESSMENT & PLAN:   Small lymphocytic lymphoma ([place]) 1.  Clinical stage IIIb small lymphocytic lymphoma: - 56 pound weight loss in the last 1 and half year.  No fevers or night sweats. - Right upper quadrant pain for the past 4 months, worse on eating and better on lying on left side. -PET/CT scan on 04/13/2019 showed pathologically enlarged mesenteric and retroperitoneal adenopathy.  SUV ranged from 2.6-3.2.  Left external iliac lymph node 0.9 cm, SUV 2.  1.1 cm left axillary lymph node, SUV 2.1.  Accentuated activity in the left sternoclavicular joint, maximum SUV 5.3, likely secondary to mild arthropathy. - Left axillary lymph node biopsy on 04/27/2019 consistent with small lymphocytic lymphoma. -We discussed the results of bone marrow biopsy which showed involvement by the lymphoma.  Chromosome analysis was 46, XY.  CLL FISH panel showed trisomy 12 which portends neutral prognosis. -T p53 mutation was not detected. -I GVH mutation analysis could not be reported, as analysis did not yield interpretable result.  Repeat sampling was recommended. -I GVH mutation cannot be done on the pathology block. -Obinutuzumab cycle 1 day 1  on 06/09/2019. -He reported headaches which start the next day after each infusion of obinutuzumab.  I have reviewed literature on obinutuzumab but could not find any reporting of headaches associated with it.  He is taking hydrocodone which is helping with the headache.  He does not require more than 1 or 2 tablets each time he gets infusion. -His left axillary lymph node has decreased in size.  I have reviewed his labs. -We will proceed with his day 15 treatment today.  I will see him back in 2 weeks to initiate cycle 2 every 28 days. -Plan to initiate  venetoclax ramp-up dose at that time.   2.  Weight loss: -He had 56 pound weight loss in the last 1 and half year. -He had gallbladder resected on 05/18/2019 which showed chronic cholecystitis.  3.  TLS prophylaxis: -He will continue allopurinol 300 mg daily.  4.  Infection prophylaxis: -He is taking Bactrim 3 times a week. -We have discontinued acyclovir secondary to possibility of headaches.  He will start back twice daily.  He will hold it if headaches come back.     Orders placed this encounter:  Orders Placed This Encounter  Procedures  . CBC with Differential/Platelet  . Comprehensive metabolic panel  . Uric acid      Derek Jack, MD [place] [phone]

## 2019-06-23 NOTE — Progress Notes (Signed)
Sung Amabile tolerated infusion without incident or complaint. VSS upon completion of infusion. Pt discharged self ambulatory in satisfactory condition.

## 2019-06-29 ENCOUNTER — Telehealth (HOSPITAL_COMMUNITY): Payer: Self-pay | Admitting: *Deleted

## 2019-06-29 NOTE — Telephone Encounter (Signed)
Pt called into clinic wanting to know if he needs to start back on his acyclovir 400 mg BID. I spoke with Dr. Delton Coombes and he recommended pt start back and if pt starts to have headaches again to stop medication and call the clinic.Called pt back to let him know and pt verbalized understanding.

## 2019-06-30 ENCOUNTER — Ambulatory Visit (HOSPITAL_COMMUNITY): Payer: 59

## 2019-06-30 ENCOUNTER — Other Ambulatory Visit (HOSPITAL_COMMUNITY): Payer: 59

## 2019-06-30 ENCOUNTER — Other Ambulatory Visit (HOSPITAL_COMMUNITY): Payer: Self-pay | Admitting: Hematology

## 2019-06-30 ENCOUNTER — Ambulatory Visit (HOSPITAL_COMMUNITY): Payer: 59 | Admitting: Hematology

## 2019-06-30 DIAGNOSIS — C8300 Small cell B-cell lymphoma, unspecified site: Secondary | ICD-10-CM

## 2019-07-07 ENCOUNTER — Inpatient Hospital Stay (HOSPITAL_BASED_OUTPATIENT_CLINIC_OR_DEPARTMENT_OTHER): Payer: 59 | Admitting: Hematology

## 2019-07-07 ENCOUNTER — Other Ambulatory Visit: Payer: Self-pay

## 2019-07-07 ENCOUNTER — Encounter (HOSPITAL_COMMUNITY): Payer: Self-pay | Admitting: Hematology

## 2019-07-07 ENCOUNTER — Inpatient Hospital Stay (HOSPITAL_COMMUNITY): Payer: 59

## 2019-07-07 ENCOUNTER — Inpatient Hospital Stay (HOSPITAL_COMMUNITY): Payer: 59 | Attending: Hematology

## 2019-07-07 VITALS — BP 138/85 | HR 65 | Temp 96.8°F | Resp 18

## 2019-07-07 VITALS — BP 127/77 | HR 50 | Temp 97.1°F | Resp 18 | Wt 193.2 lb

## 2019-07-07 DIAGNOSIS — I10 Essential (primary) hypertension: Secondary | ICD-10-CM | POA: Diagnosis not present

## 2019-07-07 DIAGNOSIS — F17210 Nicotine dependence, cigarettes, uncomplicated: Secondary | ICD-10-CM | POA: Diagnosis not present

## 2019-07-07 DIAGNOSIS — Z79899 Other long term (current) drug therapy: Secondary | ICD-10-CM | POA: Diagnosis not present

## 2019-07-07 DIAGNOSIS — C8300 Small cell B-cell lymphoma, unspecified site: Secondary | ICD-10-CM

## 2019-07-07 DIAGNOSIS — Z5111 Encounter for antineoplastic chemotherapy: Secondary | ICD-10-CM | POA: Insufficient documentation

## 2019-07-07 DIAGNOSIS — R1011 Right upper quadrant pain: Secondary | ICD-10-CM | POA: Insufficient documentation

## 2019-07-07 DIAGNOSIS — R634 Abnormal weight loss: Secondary | ICD-10-CM | POA: Diagnosis not present

## 2019-07-07 DIAGNOSIS — C9110 Chronic lymphocytic leukemia of B-cell type not having achieved remission: Secondary | ICD-10-CM | POA: Insufficient documentation

## 2019-07-07 LAB — CBC WITH DIFFERENTIAL/PLATELET
Abs Immature Granulocytes: 0.01 10*3/uL (ref 0.00–0.07)
Basophils Absolute: 0.1 10*3/uL (ref 0.0–0.1)
Basophils Relative: 1 %
Eosinophils Absolute: 0.4 10*3/uL (ref 0.0–0.5)
Eosinophils Relative: 5 %
HCT: 40.3 % (ref 39.0–52.0)
Hemoglobin: 13 g/dL (ref 13.0–17.0)
Immature Granulocytes: 0 %
Lymphocytes Relative: 30 %
Lymphs Abs: 2.3 10*3/uL (ref 0.7–4.0)
MCH: 28.9 pg (ref 26.0–34.0)
MCHC: 32.3 g/dL (ref 30.0–36.0)
MCV: 89.6 fL (ref 80.0–100.0)
Monocytes Absolute: 0.6 10*3/uL (ref 0.1–1.0)
Monocytes Relative: 7 %
Neutro Abs: 4.5 10*3/uL (ref 1.7–7.7)
Neutrophils Relative %: 57 %
Platelets: 248 10*3/uL (ref 150–400)
RBC: 4.5 MIL/uL (ref 4.22–5.81)
RDW: 13.7 % (ref 11.5–15.5)
WBC: 7.8 10*3/uL (ref 4.0–10.5)
nRBC: 0 % (ref 0.0–0.2)

## 2019-07-07 LAB — URIC ACID: Uric Acid, Serum: 3.7 mg/dL (ref 3.7–8.6)

## 2019-07-07 LAB — COMPREHENSIVE METABOLIC PANEL
ALT: 15 U/L (ref 0–44)
AST: 17 U/L (ref 15–41)
Albumin: 3.8 g/dL (ref 3.5–5.0)
Alkaline Phosphatase: 75 U/L (ref 38–126)
Anion gap: 7 (ref 5–15)
BUN: 15 mg/dL (ref 6–20)
CO2: 24 mmol/L (ref 22–32)
Calcium: 8.7 mg/dL — ABNORMAL LOW (ref 8.9–10.3)
Chloride: 108 mmol/L (ref 98–111)
Creatinine, Ser: 0.83 mg/dL (ref 0.61–1.24)
GFR calc Af Amer: >60 mL/min (ref >60)
GFR calc non Af Amer: >60 mL/min (ref >60)
Glucose, Bld: 68 mg/dL — ABNORMAL LOW (ref 70–99)
Potassium: 4 mmol/L (ref 3.5–5.1)
Sodium: 139 mmol/L (ref 135–145)
Total Bilirubin: 0.6 mg/dL (ref 0.3–1.2)
Total Protein: 6.4 g/dL — ABNORMAL LOW (ref 6.5–8.1)

## 2019-07-07 LAB — MAGNESIUM: Magnesium: 1.9 mg/dL (ref 1.7–2.4)

## 2019-07-07 LAB — PHOSPHORUS: Phosphorus: 3.7 mg/dL (ref 2.5–4.6)

## 2019-07-07 MED ORDER — DIPHENHYDRAMINE HCL 50 MG/ML IJ SOLN
50.0000 mg | Freq: Once | INTRAMUSCULAR | Status: AC
  Administered 2019-07-07: 10:00:00 50 mg via INTRAVENOUS
  Filled 2019-07-07: qty 1

## 2019-07-07 MED ORDER — ACETAMINOPHEN 325 MG PO TABS
650.0000 mg | ORAL_TABLET | Freq: Once | ORAL | Status: AC
  Administered 2019-07-07: 650 mg via ORAL
  Filled 2019-07-07: qty 2

## 2019-07-07 MED ORDER — SODIUM CHLORIDE 0.9 % IV SOLN
1000.0000 mg | Freq: Once | INTRAVENOUS | Status: AC
  Administered 2019-07-07: 11:00:00 1000 mg via INTRAVENOUS
  Filled 2019-07-07: qty 40

## 2019-07-07 MED ORDER — SODIUM CHLORIDE 0.9 % IV SOLN
20.0000 mg | Freq: Once | INTRAVENOUS | Status: AC
  Administered 2019-07-07: 10:00:00 20 mg via INTRAVENOUS
  Filled 2019-07-07: qty 20

## 2019-07-07 MED ORDER — FAMOTIDINE IN NACL 20-0.9 MG/50ML-% IV SOLN
20.0000 mg | Freq: Once | INTRAVENOUS | Status: AC
  Administered 2019-07-07: 10:00:00 20 mg via INTRAVENOUS
  Filled 2019-07-07: qty 50

## 2019-07-07 MED ORDER — SODIUM CHLORIDE 0.9 % IV SOLN
Freq: Once | INTRAVENOUS | Status: AC
  Administered 2019-07-07: 09:00:00 via INTRAVENOUS

## 2019-07-07 NOTE — Progress Notes (Signed)
Patient presents today for treatment, and follow up visit with Dr. Delton Coombes. MAR reviewed and updated. Patient has no complaints of any pain or changes since the last visit. Vital signs within parameters for treatment. Labs within parameters for treatment.   Message received from [facility] LPN Proceed with treatment today.    Treatment given today per MD orders. Tolerated infusion without adverse affects. Vital signs stable. No complaints at this time. Discharged from clinic ambulatory. F/U with [facility] as scheduled.

## 2019-07-07 NOTE — Assessment & Plan Note (Signed)
1.  Clinical stage IIIb small lymphocytic lymphoma: - 56 pound weight loss in the last 1 and half year.  No fevers or night sweats. - Right upper quadrant pain for the past 4 months, worse on eating and better on lying on left side. -PET/CT scan on 04/13/2019 showed pathologically enlarged mesenteric and retroperitoneal adenopathy.  SUV ranged from 2.6-3.2.  Left external iliac lymph node 0.9 cm, SUV 2.  1.1 cm left axillary lymph node, SUV 2.1.  Accentuated activity in the left sternoclavicular joint, maximum SUV 5.3, likely secondary to mild arthropathy. - Left axillary lymph node biopsy on 04/27/2019 consistent with small lymphocytic lymphoma. -We discussed the results of bone marrow biopsy which showed involvement by the lymphoma.  Chromosome analysis was 46, XY.  CLL FISH panel showed trisomy 12 which portends neutral prognosis. -T p53 mutation was not detected. -I GVH mutation analysis could not be reported, as analysis did not yield interpretable result.  Repeat sampling was recommended. -I GVH mutation cannot be done on the pathology block. -Obinutuzumab cycle 1 day 1 on 06/09/2019. -He had a good last 2 weeks.  I have reviewed his labs which are grossly within normal limits. -Physical examination did not show any palpable adenopathy. -We will proceed with cycle 2 of obinutuzumab today.  I will start him on ramp-up dose of venetoclax today at 20 mg daily for 7 days.  I have discussed the side effects in detail.  We will see him back next week and check his tumor lysis labs.   2.  Weight loss: -He had 56 pound weight loss in the last 1 and half year. -He had gallbladder resected on 05/18/2019 which showed chronic cholecystitis. -He is beginning to gain some weight back.  3.  TLS prophylaxis: -He will continue allopurinol 300 mg daily.  4.  Infection prophylaxis: -He started getting headaches again after acyclovir started back again.  I told him to discontinue acyclovir. -He will  continue Bactrim 3 times a week. 

## 2019-07-07 NOTE — Patient Instructions (Signed)
[place] at [facility] Discharge Instructions  You were seen today by Dr. Delton Coombes. He went over your recent results. Start taking 20mg  venetoclax daily. He will see you back in 1 week for labs and follow up.   Thank you for choosing [place] at [facility] to provide your oncology and hematology care.  To afford each patient quality time with our provider, please arrive at least 15 minutes before your scheduled appointment time.   If you have a lab appointment with the [facility] please come in thru the  Main Entrance and check in at the main information desk  You need to re-schedule your appointment should you arrive 10 or more minutes late.  We strive to give you quality time with our providers, and arriving late affects you and other patients whose appointments are after yours.  Also, if you no show three or more times for appointments you may be dismissed from the clinic at the providers discretion.     Again, thank you for choosing [facility].  Our hope is that these requests will decrease the amount of time that you wait before being seen by our physicians.       _____________________________________________________________  Should you have questions after your visit to [facility], please contact our office at [phone] between the hours of 8:00 a.m. and 4:30 p.m.  Voicemails left after 4:00 p.m. will not be returned until the following business day.  For prescription refill requests, have your pharmacy contact our office and allow 72 hours.    Cancer Center Support Programs:   > Cancer Support Group  2nd Tuesday of the month 1pm-2pm, Journey Room

## 2019-07-07 NOTE — Progress Notes (Signed)
Patrick Rubio, [place] [address]   CLINIC:  Medical Oncology/Hematology  PCP:  Orpah Greek, MD [address], [address] [place] [address] [phone]   REASON FOR VISIT:  Small lymphocytic lymphoma.  CURRENT THERAPY: Work-up in progress.  INTERVAL HISTORY:  Patrick Rubio seen for follow-up of SLL.  He reported having headaches after restarting acyclovir.  Appetite is 50%.  Energy levels are 100%.  He gained about 4 pounds since last visit.  Having some sleep problems which are chronic.  Denies any nausea, vomiting, diarrhea or constipation.  No fevers or chills reported.  No infections in the last 2 weeks.    REVIEW OF SYSTEMS:  Review of Systems  Neurological: Positive for headaches.  Psychiatric/Behavioral: Positive for sleep disturbance.  All other systems reviewed and are negative.    PAST MEDICAL/SURGICAL HISTORY:  Past Medical History:  Diagnosis Date  . CAD (coronary artery disease)   . GERD (gastroesophageal reflux disease)   . HTN (hypertension)   . Hypercholesterolemia   . MI (myocardial infarction) ([place]) 2003   Past Surgical History:  Procedure Laterality Date  . AXILLARY LYMPH NODE BIOPSY Left 04/27/2019   Procedure: AXILLARY LYMPH NODE BIOPSY;  Surgeon: Aviva Signs, MD;  Location: [facility];  Service: General;  Laterality: Left;  . BIOPSY  11/26/2018   Procedure: BIOPSY;  Surgeon: Daneil Dolin, MD;  Location: [facility];  Service: Endoscopy;;  . CHOLECYSTECTOMY N/A 05/18/2019   Procedure: LAPAROSCOPIC CHOLECYSTECTOMY;  Surgeon: Aviva Signs, MD;  Location: [facility];  Service: General;  Laterality: N/A;  . CORONARY ANGIOPLASTY WITH STENT PLACEMENT     2003  . ESOPHAGOGASTRODUODENOSCOPY N/A 11/26/2018   mild erosive reflux esophagitis, gastric erythema. Negative H.pylori   . INGUINAL HERNIA REPAIR Right 04/27/2019   Procedure: HERNIA REPAIR INGUINAL ADULT;  Surgeon: Aviva Signs, MD;  Location: [facility];   Service: General;  Laterality: Right;  . KNEE SURGERY Right   . lumbar fusion with cage  07/2017  . NASAL SINUS SURGERY    . TONSILLECTOMY AND ADENOIDECTOMY       SOCIAL HISTORY:  Social History   Socioeconomic History  . Marital status: Divorced    Spouse name: Not on file  . Number of children: 2  . Years of education: Not on file  . Highest education level: Not on file  Occupational History  . Occupation: employed    Comment: part time at [employer]  . Financial resource strain: Not hard at all  . Food insecurity    Worry: Never true    Inability: Never true  . Transportation needs    Medical: No    Non-medical: No  Tobacco Use  . Smoking status: Current Every Day Smoker    Packs/day: 0.50    Years: 25.00    Pack years: 12.50    Types: Cigarettes  . Smokeless tobacco: Never Used  Substance and Sexual Activity  . Alcohol use: Yes    Comment: occ  . Drug use: Never  . Sexual activity: Yes  Lifestyle  . Physical activity    Days per week: 4 days    Minutes per session: 60 min  . Stress: Not at all  Relationships  . Social connections    Talks on phone: More than three times a week    Gets together: More than three times a week    Attends religious service: More than 4 times per  year    Active member of club or organization: Yes    Attends meetings of clubs or organizations: 1 to 4 times per year    Relationship status: Divorced  . Intimate partner violence    Fear of current or ex partner: No    Emotionally abused: No    Physically abused: No    Forced sexual activity: No  Other Topics Concern  . Not on file  Social History Narrative  . Not on file    FAMILY HISTORY:  Family History  Problem Relation Age of Onset  . Colon polyps Father        thinks he may have had polyps, possibly in his 38s.   [name] Heart attack Father   . Dementia Mother   . Hypotension Mother   . Stroke Sister   . Diabetes Maternal Grandmother   . Stroke  Maternal Grandfather   . Cancer Paternal Grandmother   . Congestive Heart Failure Paternal Grandfather   . Kidney cancer Daughter   . Colon cancer Neg Hx   . Pancreatic cancer Neg Hx   . Liver disease Neg Hx     CURRENT MEDICATIONS:  Outpatient Encounter Medications as of 07/07/2019  Medication Sig Note  . acyclovir (ZOVIRAX) 400 MG tablet Take 1 tablet (400 mg total) by mouth 2 (two) times daily.   [name] allopurinol (ZYLOPRIM) 300 MG tablet TAKE 1 TABLET BY MOUTH EVERY DAY   . aspirin EC 81 MG tablet Take 81 mg by mouth 3 (three) times a week. Pt takes M,W,F   . clopidogrel (PLAVIX) 75 MG tablet Take 75 mg by mouth daily.    [name] escitalopram (LEXAPRO) 20 MG tablet Take 20 mg by mouth daily.    [name] lisinopril (PRINIVIL,ZESTRIL) 10 MG tablet Take 10 mg by mouth daily.    [name] obinutuzumab in sodium chloride 0.9 % 250 mL Inject into the vein.   . simvastatin (ZOCOR) 40 MG tablet Take 40 mg by mouth every other day.    . sulfamethoxazole-trimethoprim (BACTRIM DS) 800-160 MG tablet Take 1 tablet by mouth 3 (three) times a week.   . baclofen (LIORESAL) 10 MG tablet Take 10 mg by mouth 2 (two) times daily as needed for muscle spasms.    . cimetidine (TAGAMET) 200 MG tablet Take 200 mg by mouth as needed.    . gabapentin (NEURONTIN) 100 MG capsule Take 100 mg by mouth 2 (two) times daily as needed (pain).    . naproxen sodium (ALEVE) 220 MG tablet Take 220-440 mg by mouth 2 (two) times daily as needed (pain).    [name] oxyCODONE-acetaminophen (PERCOCET) 7.5-325 MG tablet Take 1 tablet by mouth every 6 (six) hours as needed. (Patient not taking: Reported on 06/09/2019)   . pantoprazole (PROTONIX) 40 MG tablet Take 40 mg by mouth as needed.    . venetoclax 10 & 50 & 100 MG TBPK Take 20 mg once daily during week 1, 50 mg once daily during week 2, 100 mg once daily during week 3, 200 mg once daily during week 4. Continue escalation as instructed 07/07/2019: Pt hasn't started yet   No facility-administered encounter  medications on file as of 07/07/2019.     ALLERGIES:  No Known Allergies   PHYSICAL EXAM:  ECOG Performance status: 1  Vitals:   07/07/19 0824  BP: 127/77  Pulse: (!) 50  Resp: 18  Temp: (!) 97.1 F (36.2 C)  SpO2: 100%   Filed Weights   07/07/19 0824  Weight: 193 lb 3.2 oz (87.6 kg)    Physical Exam Vitals signs reviewed.  Constitutional:      Appearance: Normal appearance.  Cardiovascular:     Rate and Rhythm: Normal rate and regular rhythm.     Heart sounds: Normal heart sounds.  Pulmonary:     Effort: Pulmonary effort is normal.     Breath sounds: Normal breath sounds.  Abdominal:     General: There is no distension.     Palpations: Abdomen is soft. There is no mass.  Musculoskeletal:        General: No swelling.  Skin:    General: Skin is warm.  Neurological:     General: No focal deficit present.     Mental Status: He is alert and oriented to person, place, and time.  Psychiatric:        Mood and Affect: Mood normal.        Behavior: Behavior normal.      LABORATORY DATA:  I have reviewed the labs as listed.  CBC    Component Value Date/Time   WBC 7.8 07/07/2019 0807   RBC 4.50 07/07/2019 0807   HGB 13.0 07/07/2019 0807   HCT 40.3 07/07/2019 0807   PLT 248 07/07/2019 0807   MCV 89.6 07/07/2019 0807   MCH 28.9 07/07/2019 0807   MCHC 32.3 07/07/2019 0807   RDW 13.7 07/07/2019 0807   LYMPHSABS 2.3 07/07/2019 0807   MONOABS 0.6 07/07/2019 0807   EOSABS 0.4 07/07/2019 0807   BASOSABS 0.1 07/07/2019 0807   CMP Latest Ref Rng & Units 07/07/2019 06/23/2019 06/16/2019  Glucose 70 - 99 mg/dL 68(L) 68(L) 127(H)  BUN 6 - 20 mg/dL '15 17 14  '$ Creatinine 0.61 - 1.24 mg/dL 0.83 0.94 0.94  Sodium 135 - 145 mmol/L 139 138 137  Potassium 3.5 - 5.1 mmol/L 4.0 4.4 4.2  Chloride 98 - 111 mmol/L 108 108 106  CO2 22 - 32 mmol/L '24 24 24  '$ Calcium 8.9 - 10.3 mg/dL 8.7(L) 8.6(L) 8.6(L)  Total Protein 6.5 - 8.1 g/dL 6.4(L) 6.5 6.2(L)  Total Bilirubin 0.3 - 1.2  mg/dL 0.6 0.6 0.5  Alkaline Phos 38 - 126 U/L 75 68 65  AST 15 - 41 U/L '17 19 16  '$ ALT 0 - 44 U/L '15 15 15       '$ DIAGNOSTIC IMAGING:  I have independently reviewed the scans and discussed with the patient.    ASSESSMENT & PLAN:   Small lymphocytic lymphoma ([place]) 1.  Clinical stage IIIb small lymphocytic lymphoma: - 56 pound weight loss in the last 1 and half year.  No fevers or night sweats. - Right upper quadrant pain for the past 4 months, worse on eating and better on lying on left side. -PET/CT scan on 04/13/2019 showed pathologically enlarged mesenteric and retroperitoneal adenopathy.  SUV ranged from 2.6-3.2.  Left external iliac lymph node 0.9 cm, SUV 2.  1.1 cm left axillary lymph node, SUV 2.1.  Accentuated activity in the left sternoclavicular joint, maximum SUV 5.3, likely secondary to mild arthropathy. - Left axillary lymph node biopsy on 04/27/2019 consistent with small lymphocytic lymphoma. -We discussed the results of bone marrow biopsy which showed involvement by the lymphoma.  Chromosome analysis was 46, XY.  CLL FISH panel showed trisomy 12 which portends neutral prognosis. -T p53 mutation was not detected. -I GVH mutation analysis could not be reported, as analysis did not yield interpretable result.  Repeat sampling was recommended. -I GVH mutation cannot be  done on the pathology block. -Obinutuzumab cycle 1 day 1 on 06/09/2019. -He had a good last 2 weeks.  I have reviewed his labs which are grossly within normal limits. -Physical examination did not show any palpable adenopathy. -We will proceed with cycle 2 of obinutuzumab today.  I will start him on ramp-up dose of venetoclax today at 20 mg daily for 7 days.  I have discussed the side effects in detail.  We will see him back next week and check his tumor lysis labs.   2.  Weight loss: -He had 56 pound weight loss in the last 1 and half year. -He had gallbladder resected on 05/18/2019 which showed chronic  cholecystitis. -He is beginning to gain some weight back.  3.  TLS prophylaxis: -He will continue allopurinol 300 mg daily.  4.  Infection prophylaxis: -He started getting headaches again after acyclovir started back again.  I told him to discontinue acyclovir. -He will continue Bactrim 3 times a week.     Orders placed this encounter:  Orders Placed This Encounter  Procedures  . CBC with Differential/Platelet  . Comprehensive metabolic panel  . Magnesium  . Phosphorus  . Uric acid      Derek Jack, MD [place] [phone]

## 2019-07-07 NOTE — Patient Instructions (Signed)
[facility] Discharge Instructions for Patients Receiving Chemotherapy  Today you received the following chemotherapy agents   To help prevent nausea and vomiting after your treatment, we encourage you to take your nausea medication   If you develop nausea and vomiting that is not controlled by your nausea medication, call the clinic.   BELOW ARE SYMPTOMS THAT SHOULD BE REPORTED IMMEDIATELY:  *FEVER GREATER THAN 100.5 F  *CHILLS WITH OR WITHOUT FEVER  NAUSEA AND VOMITING THAT IS NOT CONTROLLED WITH YOUR NAUSEA MEDICATION  *UNUSUAL SHORTNESS OF BREATH  *UNUSUAL BRUISING OR BLEEDING  TENDERNESS IN MOUTH AND THROAT WITH OR WITHOUT PRESENCE OF ULCERS  *URINARY PROBLEMS  *BOWEL PROBLEMS  UNUSUAL RASH Items with * indicate a potential emergency and should be followed up as soon as possible.  Feel free to call the clinic should you have any questions or concerns. The clinic phone number is [phone].  Please show the CHEMO ALERT CARD at check-in to the Emergency Department and triage nurse.   

## 2019-07-14 ENCOUNTER — Other Ambulatory Visit: Payer: Self-pay

## 2019-07-14 ENCOUNTER — Inpatient Hospital Stay (HOSPITAL_COMMUNITY): Payer: 59

## 2019-07-14 ENCOUNTER — Inpatient Hospital Stay (HOSPITAL_BASED_OUTPATIENT_CLINIC_OR_DEPARTMENT_OTHER): Payer: 59 | Admitting: Hematology

## 2019-07-14 ENCOUNTER — Encounter (HOSPITAL_COMMUNITY): Payer: Self-pay | Admitting: Hematology

## 2019-07-14 VITALS — BP 142/80 | HR 60 | Temp 97.3°F | Resp 18 | Wt 189.6 lb

## 2019-07-14 DIAGNOSIS — C8300 Small cell B-cell lymphoma, unspecified site: Secondary | ICD-10-CM

## 2019-07-14 DIAGNOSIS — C9110 Chronic lymphocytic leukemia of B-cell type not having achieved remission: Secondary | ICD-10-CM | POA: Diagnosis not present

## 2019-07-14 LAB — CBC WITH DIFFERENTIAL/PLATELET
Abs Immature Granulocytes: 0.03 10*3/uL (ref 0.00–0.07)
Basophils Absolute: 0.1 10*3/uL (ref 0.0–0.1)
Basophils Relative: 1 %
Eosinophils Absolute: 0.3 10*3/uL (ref 0.0–0.5)
Eosinophils Relative: 4 %
HCT: 40.4 % (ref 39.0–52.0)
Hemoglobin: 13.1 g/dL (ref 13.0–17.0)
Immature Granulocytes: 0 %
Lymphocytes Relative: 37 %
Lymphs Abs: 2.8 10*3/uL (ref 0.7–4.0)
MCH: 29 pg (ref 26.0–34.0)
MCHC: 32.4 g/dL (ref 30.0–36.0)
MCV: 89.6 fL (ref 80.0–100.0)
Monocytes Absolute: 0.7 10*3/uL (ref 0.1–1.0)
Monocytes Relative: 9 %
Neutro Abs: 3.8 10*3/uL (ref 1.7–7.7)
Neutrophils Relative %: 49 %
Platelets: 254 10*3/uL (ref 150–400)
RBC: 4.51 MIL/uL (ref 4.22–5.81)
RDW: 14.1 % (ref 11.5–15.5)
WBC: 7.6 10*3/uL (ref 4.0–10.5)
nRBC: 0 % (ref 0.0–0.2)

## 2019-07-14 LAB — COMPREHENSIVE METABOLIC PANEL
ALT: 16 U/L (ref 0–44)
AST: 17 U/L (ref 15–41)
Albumin: 3.8 g/dL (ref 3.5–5.0)
Alkaline Phosphatase: 70 U/L (ref 38–126)
Anion gap: 8 (ref 5–15)
BUN: 12 mg/dL (ref 6–20)
CO2: 25 mmol/L (ref 22–32)
Calcium: 8.7 mg/dL — ABNORMAL LOW (ref 8.9–10.3)
Chloride: 103 mmol/L (ref 98–111)
Creatinine, Ser: 0.94 mg/dL (ref 0.61–1.24)
GFR calc Af Amer: >60 mL/min (ref >60)
GFR calc non Af Amer: >60 mL/min (ref >60)
Glucose, Bld: 83 mg/dL (ref 70–99)
Potassium: 3.9 mmol/L (ref 3.5–5.1)
Sodium: 136 mmol/L (ref 135–145)
Total Bilirubin: 0.7 mg/dL (ref 0.3–1.2)
Total Protein: 6.8 g/dL (ref 6.5–8.1)

## 2019-07-14 LAB — URIC ACID: Uric Acid, Serum: 3.9 mg/dL (ref 3.7–8.6)

## 2019-07-14 NOTE — Patient Instructions (Addendum)
[facility] at [facility] Discharge Instructions  You were seen today by Dr. Delton Coombes. He went over your recent lab results. Increase your dose of Venetoclax to 50mg  daily. He will see you back in 1 week for labs and follow up.   Thank you for choosing [facility] at [facility] to provide your oncology and hematology care.  To afford each patient quality time with our provider, please arrive at least 15 minutes before your scheduled appointment time.   If you have a lab appointment with the [facility] please come in thru the  Main Entrance and check in at the main information desk  You need to re-schedule your appointment should you arrive 10 or more minutes late.  We strive to give you quality time with our providers, and arriving late affects you and other patients whose appointments are after yours.  Also, if you no show three or more times for appointments you may be dismissed from the clinic at the providers discretion.     Again, thank you for choosing [facility].  Our hope is that these requests will decrease the amount of time that you wait before being seen by our physicians.       _____________________________________________________________  Should you have questions after your visit to [facility], please contact our office at [phone] between the hours of 8:00 a.m. and 4:30 p.m.  Voicemails left after 4:00 p.m. will not be returned until the following business day.  For prescription refill requests, have your pharmacy contact our office and allow 72 hours.    Cancer Center Support Programs:   > Cancer Support Group  2nd Tuesday of the month 1pm-2pm, Journey Room

## 2019-07-14 NOTE — Progress Notes (Signed)
[place], [place] [address]   CLINIC:  Medical Oncology/Hematology  PCP:  Orpah Greek, MD [address], [address] [place] [address] [phone]   REASON FOR VISIT:  Small lymphocytic lymphoma.  CURRENT THERAPY: Work-up in progress.  INTERVAL HISTORY:  Patrick Rubio 58 y.o. male seen for follow-up of small lymphocytic lymphoma.  He started taking venetoclax 20 mg daily last week.  He has denied any nausea, vomiting, diarrhea or constipation.  However he developed some arthralgias in the shoulders, knees and hips.  This started when he started venetoclax.  Denies any fevers or chills.   REVIEW OF SYSTEMS:  Review of Systems  Musculoskeletal: Positive for arthralgias.  All other systems reviewed and are negative.    PAST MEDICAL/SURGICAL HISTORY:  Past Medical History:  Diagnosis Date  . CAD (coronary artery disease)   . GERD (gastroesophageal reflux disease)   . HTN (hypertension)   . Hypercholesterolemia   . MI (myocardial infarction) ([place]) 2003   Past Surgical History:  Procedure Laterality Date  . AXILLARY LYMPH NODE BIOPSY Left 04/27/2019   Procedure: AXILLARY LYMPH NODE BIOPSY;  Surgeon: Aviva Signs, MD;  Location: [facility];  Service: General;  Laterality: Left;  . BIOPSY  11/26/2018   Procedure: BIOPSY;  Surgeon: Daneil Dolin, MD;  Location: [facility];  Service: Endoscopy;;  . CHOLECYSTECTOMY N/A 05/18/2019   Procedure: LAPAROSCOPIC CHOLECYSTECTOMY;  Surgeon: Aviva Signs, MD;  Location: [facility];  Service: General;  Laterality: N/A;  . CORONARY ANGIOPLASTY WITH STENT PLACEMENT     2003  . ESOPHAGOGASTRODUODENOSCOPY N/A 11/26/2018   mild erosive reflux esophagitis, gastric erythema. Negative H.pylori   . INGUINAL HERNIA REPAIR Right 04/27/2019   Procedure: HERNIA REPAIR INGUINAL ADULT;  Surgeon: Aviva Signs, MD;  Location: [facility];  Service: General;  Laterality: Right;  . KNEE SURGERY Right   . lumbar fusion with  cage  07/2017  . NASAL SINUS SURGERY    . TONSILLECTOMY AND ADENOIDECTOMY       SOCIAL HISTORY:  Social History   Socioeconomic History  . Marital status: Divorced    Spouse name: Not on file  . Number of children: 2  . Years of education: Not on file  . Highest education level: Not on file  Occupational History  . 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 Smoking status: Current Every Day Smoker    Packs/day: 0.50    Years: 25.00    Pack years: 12.50    Types: Cigarettes  . Smokeless tobacco: Never Used  Substance and Sexual Activity  . Alcohol use: Yes    Comment: occ  . Drug use: Never  . Sexual activity: Yes  Other Topics Concern  . Not on file  Social History Narrative  . Not on file   Social Determinants of Health   Financial Resource Strain: Low Risk   . Difficulty of Paying Living Expenses: Not hard at all  Food Insecurity: No Food Insecurity  . Worried About Charity fundraiser in the Last Year: Never true  . Ran Out of Food in the Last Year: Never true  Transportation Needs: No Transportation Needs  . Lack of Transportation (Medical): No  . Lack of Transportation (Non-Medical): No  Physical Activity: Sufficiently Active  . Days of Exercise per Week: 4 days  . Minutes of Exercise per Session: 60 min  Stress: No Stress Concern Present  .  Feeling of Stress : Not at all  Social Connections: Slightly Isolated  . Frequency of Communication with Friends and Family: More than three times a week  . Frequency of Social Gatherings with Friends and Family: More than three times a week  . Attends Religious Services: More than 4 times per year  . Active Member of Clubs or Organizations: Yes  . Attends Archivist Meetings: 1 to 4 times per year  . Marital Status: Divorced  Human resources officer Violence: Not At Risk  . Fear of Current or Ex-Partner: No  . Emotionally Abused: No  . Physically Abused: No  . Sexually Abused:  No    FAMILY HISTORY:  Family History  Problem Relation Age of Onset  . Colon polyps Father        thinks he may have had polyps, possibly in his 97s.   [name] Heart attack Father   . Dementia Mother   . Hypotension Mother   . Stroke Sister   . Diabetes Maternal Grandmother   . Stroke Maternal Grandfather   . Cancer Paternal Grandmother   . Congestive Heart Failure Paternal Grandfather   . Kidney cancer Daughter   . Colon cancer Neg Hx   . Pancreatic cancer Neg Hx   . Liver disease Neg Hx     CURRENT MEDICATIONS:  Outpatient Encounter Medications as of 07/14/2019  Medication Sig Note  . acyclovir (ZOVIRAX) 400 MG tablet Take 1 tablet (400 mg total) by mouth 2 (two) times daily.   [name] allopurinol (ZYLOPRIM) 300 MG tablet TAKE 1 TABLET BY MOUTH EVERY DAY   . aspirin EC 81 MG tablet Take 81 mg by mouth 3 (three) times a week. Pt takes M,W,F   . clopidogrel (PLAVIX) 75 MG tablet Take 75 mg by mouth daily.    [name] escitalopram (LEXAPRO) 20 MG tablet Take 20 mg by mouth daily.    [name] lisinopril (PRINIVIL,ZESTRIL) 10 MG tablet Take 10 mg by mouth daily.    [name] obinutuzumab in sodium chloride 0.9 % 250 mL Inject into the vein.   . pantoprazole (PROTONIX) 40 MG tablet Take 40 mg by mouth as needed.    . simvastatin (ZOCOR) 40 MG tablet Take 40 mg by mouth every other day.    . sulfamethoxazole-trimethoprim (BACTRIM DS) 800-160 MG tablet Take 1 tablet by mouth 3 (three) times a week.   . venetoclax 10 & 50 & 100 MG TBPK Take 20 mg once daily during week 1, 50 mg once daily during week 2, 100 mg once daily during week 3, 200 mg once daily during week 4. Continue escalation as instructed 07/07/2019: Pt hasn't started yet  . baclofen (LIORESAL) 10 MG tablet Take 10 mg by mouth 2 (two) times daily as needed for muscle spasms.    . cimetidine (TAGAMET) 200 MG tablet Take 200 mg by mouth as needed.    . gabapentin (NEURONTIN) 100 MG capsule Take 100 mg by mouth 2 (two) times daily as needed (pain).    .  naproxen sodium (ALEVE) 220 MG tablet Take 220-440 mg by mouth 2 (two) times daily as needed (pain).    [name] oxyCODONE-acetaminophen (PERCOCET) 7.5-325 MG tablet Take 1 tablet by mouth every 6 (six) hours as needed. (Patient not taking: Reported on 06/09/2019)    No facility-administered encounter medications on file as of 07/14/2019.    ALLERGIES:  No Known Allergies   PHYSICAL EXAM:  ECOG Performance status: 1  Vitals:   07/14/19 1549  BP: [name])  142/80  Pulse: 60  Resp: 18  Temp: (!) 97.3 F (36.3 C)  SpO2: 100%   Filed Weights   07/14/19 1549  Weight: 189 lb 9.6 oz (86 kg)    Physical Exam Vitals reviewed.  Constitutional:      Appearance: Normal appearance.  Cardiovascular:     Rate and Rhythm: Normal rate and regular rhythm.     Heart sounds: Normal heart sounds.  Pulmonary:     Effort: Pulmonary effort is normal.     Breath sounds: Normal breath sounds.  Abdominal:     General: There is no distension.     Palpations: Abdomen is soft. There is no mass.  Musculoskeletal:        General: No swelling.  Skin:    General: Skin is warm.  Neurological:     General: No focal deficit present.     Mental Status: He is alert and oriented to person, place, and time.  Psychiatric:        Mood and Affect: Mood normal.        Behavior: Behavior normal.      LABORATORY DATA:  I have reviewed the labs as listed.  CBC    Component Value Date/Time   WBC 7.6 07/14/2019 1450   RBC 4.51 07/14/2019 1450   HGB 13.1 07/14/2019 1450   HCT 40.4 07/14/2019 1450   PLT 254 07/14/2019 1450   MCV 89.6 07/14/2019 1450   MCH 29.0 07/14/2019 1450   MCHC 32.4 07/14/2019 1450   RDW 14.1 07/14/2019 1450   LYMPHSABS 2.8 07/14/2019 1450   MONOABS 0.7 07/14/2019 1450   EOSABS 0.3 07/14/2019 1450   BASOSABS 0.1 07/14/2019 1450   CMP Latest Ref Rng & Units 07/14/2019 07/07/2019 06/23/2019  Glucose 70 - 99 mg/dL 83 68(L) 68(L)  BUN 6 - 20 mg/dL '12 15 17  '$ Creatinine 0.61 - 1.24 mg/dL  0.94 0.83 0.94  Sodium 135 - 145 mmol/L 136 139 138  Potassium 3.5 - 5.1 mmol/L 3.9 4.0 4.4  Chloride 98 - 111 mmol/L 103 108 108  CO2 22 - 32 mmol/L '25 24 24  '$ Calcium 8.9 - 10.3 mg/dL 8.7(L) 8.7(L) 8.6(L)  Total Protein 6.5 - 8.1 g/dL 6.8 6.4(L) 6.5  Total Bilirubin 0.3 - 1.2 mg/dL 0.7 0.6 0.6  Alkaline Phos 38 - 126 U/L 70 75 68  AST 15 - 41 U/L '17 17 19  '$ ALT 0 - 44 U/L '16 15 15       '$ DIAGNOSTIC IMAGING:  I have independently reviewed the scans and discussed with the patient.    ASSESSMENT & PLAN:   Small lymphocytic lymphoma ([place]) 1.  Clinical stage IIIb small lymphocytic lymphoma: - 56 pound weight loss in the last 1 and half year.  No fevers or night sweats. - Right upper quadrant pain for the past 4 months, worse on eating and better on lying on left side. -PET/CT scan on 04/13/2019 showed pathologically enlarged mesenteric and retroperitoneal adenopathy.  SUV ranged from 2.6-3.2.  Left external iliac lymph node 0.9 cm, SUV 2.  1.1 cm left axillary lymph node, SUV 2.1.  Accentuated activity in the left sternoclavicular joint, maximum SUV 5.3, likely secondary to mild arthropathy. - Left axillary lymph node biopsy on 04/27/2019 consistent with small lymphocytic lymphoma. -We discussed the results of bone marrow biopsy which showed involvement by the lymphoma.  Chromosome analysis was 46, XY.  CLL FISH panel showed trisomy 12 which portends neutral prognosis. -T p53 mutation was not detected. -  I GVH mutation analysis could not be reported, as analysis did not yield interpretable result.  Repeat sampling was recommended. -I GVH mutation cannot be done on the pathology block. -Obinutuzumab cycle 1 day 1 on 06/09/2019. -Cycle 2 of obinutuzumab on 07/07/2019. -He started venetoclax 20 mg on 07/07/2019.  He has tolerated very well. -He reported joint pains in the shoulders, hips and knees.  About 10% of people taking venetoclax can develop arthralgias. -I have reassured him that it  will get better.  He will increase venetoclax to 50 mg daily.  He was told to take NSAIDs should he have severe pains.  We have reviewed his labs in detail. -We will reevaluate him next week and review his tumor lysis labs.   2.  Weight loss: -He had 56 pound weight loss in the last 1 and half year. -He had gallbladder resected on 05/18/2019 which showed chronic cholecystitis. -His weight has been stable around 189.  Wife reports that he has been eating mostly salads for dinner. -He was told to drink Ensure if he starts losing weight.  3.  TLS prophylaxis: -He will continue allopurinol 300 mg daily.  4.  Infection prophylaxis: -He started getting headaches again after acyclovir started back again.  I told him to discontinue acyclovir. -He will continue Bactrim 3 times a week.     Orders placed this encounter:  Orders Placed This Encounter  Procedures  . CBC with Differential/Platelet  . Comprehensive metabolic panel  . Magnesium  . Phosphorus  . Uric acid      Derek Jack, MD [place] [phone]

## 2019-07-14 NOTE — Assessment & Plan Note (Signed)
1.  Clinical stage IIIb small lymphocytic lymphoma: - 56 pound weight loss in the last 1 and half year.  No fevers or night sweats. - Right upper quadrant pain for the past 4 months, worse on eating and better on lying on left side. -PET/CT scan on 04/13/2019 showed pathologically enlarged mesenteric and retroperitoneal adenopathy.  SUV ranged from 2.6-3.2.  Left external iliac lymph node 0.9 cm, SUV 2.  1.1 cm left axillary lymph node, SUV 2.1.  Accentuated activity in the left sternoclavicular joint, maximum SUV 5.3, likely secondary to mild arthropathy. - Left axillary lymph node biopsy on 04/27/2019 consistent with small lymphocytic lymphoma. -We discussed the results of bone marrow biopsy which showed involvement by the lymphoma.  Chromosome analysis was 46, XY.  CLL FISH panel showed trisomy 12 which portends neutral prognosis. -T p53 mutation was not detected. -I GVH mutation analysis could not be reported, as analysis did not yield interpretable result.  Repeat sampling was recommended. -I GVH mutation cannot be done on the pathology block. -Obinutuzumab cycle 1 day 1 on 06/09/2019. -Cycle 2 of obinutuzumab on 07/07/2019. -He started venetoclax 20 mg on 07/07/2019.  He has tolerated very well. -He reported joint pains in the shoulders, hips and knees.  About 10% of people taking venetoclax can develop arthralgias. -I have reassured him that it will get better.  He will increase venetoclax to 50 mg daily.  He was told to take NSAIDs should he have severe pains.  We have reviewed his labs in detail. -We will reevaluate him next week and review his tumor lysis labs.   2.  Weight loss: -He had 56 pound weight loss in the last 1 and half year. -He had gallbladder resected on 05/18/2019 which showed chronic cholecystitis. -His weight has been stable around 189.  Wife reports that he has been eating mostly salads for dinner. -He was told to drink Ensure if he starts losing weight.  3.  TLS  prophylaxis: -He will continue allopurinol 300 mg daily.  4.  Infection prophylaxis: -He started getting headaches again after acyclovir started back again.  I told him to discontinue acyclovir. -He will continue Bactrim 3 times a week.

## 2019-07-15 ENCOUNTER — Ambulatory Visit (HOSPITAL_COMMUNITY): Payer: 59 | Admitting: Hematology

## 2019-07-15 ENCOUNTER — Other Ambulatory Visit (HOSPITAL_COMMUNITY): Payer: 59

## 2019-07-16 ENCOUNTER — Other Ambulatory Visit (HOSPITAL_COMMUNITY): Payer: Self-pay | Admitting: Hematology

## 2019-07-16 DIAGNOSIS — C8300 Small cell B-cell lymphoma, unspecified site: Secondary | ICD-10-CM

## 2019-07-16 MED ORDER — VENETOCLAX 100 MG PO TABS: 400.0000 mg | ORAL_TABLET | Freq: Every day | ORAL | 1 refills | Status: DC

## 2019-07-21 ENCOUNTER — Other Ambulatory Visit: Payer: Self-pay

## 2019-07-21 MED FILL — VENCLEXTA 100 MG TABS: 100 | 30 days supply | Qty: 120 | Fill #0

## 2019-07-22 ENCOUNTER — Inpatient Hospital Stay (HOSPITAL_BASED_OUTPATIENT_CLINIC_OR_DEPARTMENT_OTHER): Payer: 59 | Admitting: Hematology

## 2019-07-22 ENCOUNTER — Encounter (HOSPITAL_COMMUNITY): Payer: Self-pay | Admitting: Hematology

## 2019-07-22 ENCOUNTER — Inpatient Hospital Stay (HOSPITAL_COMMUNITY): Payer: 59

## 2019-07-22 VITALS — BP 126/92 | HR 77 | Temp 97.7°F | Resp 18 | Wt 192.0 lb

## 2019-07-22 DIAGNOSIS — C9110 Chronic lymphocytic leukemia of B-cell type not having achieved remission: Secondary | ICD-10-CM | POA: Diagnosis not present

## 2019-07-22 DIAGNOSIS — C8300 Small cell B-cell lymphoma, unspecified site: Secondary | ICD-10-CM

## 2019-07-22 LAB — COMPREHENSIVE METABOLIC PANEL
ALT: 20 U/L (ref 0–44)
AST: 23 U/L (ref 15–41)
Albumin: 3.9 g/dL (ref 3.5–5.0)
Alkaline Phosphatase: 73 U/L (ref 38–126)
Anion gap: 9 (ref 5–15)
BUN: 13 mg/dL (ref 6–20)
CO2: 23 mmol/L (ref 22–32)
Calcium: 8.9 mg/dL (ref 8.9–10.3)
Chloride: 104 mmol/L (ref 98–111)
Creatinine, Ser: 0.9 mg/dL (ref 0.61–1.24)
GFR calc Af Amer: >60 mL/min (ref >60)
GFR calc non Af Amer: >60 mL/min (ref >60)
Glucose, Bld: 80 mg/dL (ref 70–99)
Potassium: 3.5 mmol/L (ref 3.5–5.1)
Sodium: 136 mmol/L (ref 135–145)
Total Bilirubin: 0.7 mg/dL (ref 0.3–1.2)
Total Protein: 6.7 g/dL (ref 6.5–8.1)

## 2019-07-22 LAB — CBC WITH DIFFERENTIAL/PLATELET
Abs Immature Granulocytes: 0.03 10*3/uL (ref 0.00–0.07)
Basophils Absolute: 0.1 10*3/uL (ref 0.0–0.1)
Basophils Relative: 1 %
Eosinophils Absolute: 0.1 10*3/uL (ref 0.0–0.5)
Eosinophils Relative: 1 %
HCT: 39.9 % (ref 39.0–52.0)
Hemoglobin: 13.2 g/dL (ref 13.0–17.0)
Immature Granulocytes: 0 %
Lymphocytes Relative: 23 %
Lymphs Abs: 2.3 10*3/uL (ref 0.7–4.0)
MCH: 29.4 pg (ref 26.0–34.0)
MCHC: 33.1 g/dL (ref 30.0–36.0)
MCV: 88.9 fL (ref 80.0–100.0)
Monocytes Absolute: 0.8 10*3/uL (ref 0.1–1.0)
Monocytes Relative: 8 %
Neutro Abs: 7 10*3/uL (ref 1.7–7.7)
Neutrophils Relative %: 67 %
Platelets: 241 10*3/uL (ref 150–400)
RBC: 4.49 MIL/uL (ref 4.22–5.81)
RDW: 14 % (ref 11.5–15.5)
WBC: 10.4 10*3/uL (ref 4.0–10.5)
nRBC: 0 % (ref 0.0–0.2)

## 2019-07-22 LAB — MAGNESIUM: Magnesium: 1.9 mg/dL (ref 1.7–2.4)

## 2019-07-22 LAB — URIC ACID: Uric Acid, Serum: 3.7 mg/dL (ref 3.7–8.6)

## 2019-07-22 LAB — PHOSPHORUS: Phosphorus: 2.9 mg/dL (ref 2.5–4.6)

## 2019-07-22 NOTE — Assessment & Plan Note (Addendum)
1.  Clinical stage IIIb small lymphocytic lymphoma: - 56 pound weight loss in the last 1 and half year.  No fevers or night sweats. - Right upper quadrant pain for the past 4 months, worse on eating and better on lying on left side. -PET/CT scan on 04/13/2019 showed pathologically enlarged mesenteric and retroperitoneal adenopathy.  SUV ranged from 2.6-3.2.  Left external iliac lymph node 0.9 cm, SUV 2.  1.1 cm left axillary lymph node, SUV 2.1.  Accentuated activity in the left sternoclavicular joint, maximum SUV 5.3, likely secondary to mild arthropathy. - Left axillary lymph node biopsy on 04/27/2019 consistent with small lymphocytic lymphoma. -We discussed the results of bone marrow biopsy which showed involvement by the lymphoma.  Chromosome analysis was 46, XY.  CLL FISH panel showed trisomy 12 which portends neutral prognosis. -T p53 mutation was not detected. -I GVH mutation analysis could not be reported, as analysis did not yield interpretable result.  Repeat sampling was recommended. -I GVH mutation cannot be done on the pathology block. -Obinutuzumab cycle 1 day 1 on 06/09/2019. -Cycle 2 of obinutuzumab on 07/07/2019. -He started venetoclax 20 mg on 07/07/2019.  He has tolerated very well. -He increased dose of venetoclax to 50 mg on 07/14/2019.  I have reviewed his labs including tumor lysis labs which are within normal limits. -An echocardiogram at Dr. Ammie Ferrier office showed EF 50-55%.  Stage I diastolic dysfunction and impaired relaxation pattern was seen.  This was done on 07/21/2019. -He reported aches in the shoulders and lower back since he started venetoclax.  He tried taking ibuprofen 800 mg which did not help. -He was told to increase venetoclax 100 mg daily today.  He will further increase it to 200 mg daily next week. -I will see him back in 2 weeks with repeat labs prior to start of cycle 3.   2.  Weight loss: -He lost about 56 pounds since last 1 and half year.  He had  gallbladder resected on 05/18/2019 which showed chronic cholecystitis. -He gained about 3 pounds since last visit.  He reportedly clears eating salads in the evenings.  He eats a small lunch  3.  TLS prophylaxis: -He will continue allopurinol 300 mg daily.  4.  Infection prophylaxis: -He will continue Bactrim 3 times a week.  We have discontinued acyclovir as it causes headaches.

## 2019-07-22 NOTE — Patient Instructions (Signed)
[place] at [facility] Discharge Instructions  You were seen today by Dr. Delton Coombes. He went over your recent lab results. Start taking 100 mg of Venetoclax daily and bump up to 200 mg next week on Wednesday. He will see you back in 2 weeks for labs and follow up.   Thank you for choosing [place] at [facility] to provide your oncology and hematology care.  To afford each patient quality time with our provider, please arrive at least 15 minutes before your scheduled appointment time.   If you have a lab appointment with the [facility] please come in thru the  Main Entrance and check in at the main information desk  You need to re-schedule your appointment should you arrive 10 or more minutes late.  We strive to give you quality time with our providers, and arriving late affects you and other patients whose appointments are after yours.  Also, if you no show three or more times for appointments you may be dismissed from the clinic at the providers discretion.     Again, thank you for choosing [facility].  Our hope is that these requests will decrease the amount of time that you wait before being seen by our physicians.       _____________________________________________________________  Should you have questions after your visit to [facility], please contact our office at [phone] between the hours of 8:00 a.m. and 4:30 p.m.  Voicemails left after 4:00 p.m. will not be returned until the following business day.  For prescription refill requests, have your pharmacy contact our office and allow 72 hours.    Cancer Center Support Programs:   > Cancer Support Group  2nd Tuesday of the month 1pm-2pm, Journey Room

## 2019-07-22 NOTE — Progress Notes (Signed)
[name], [place] [address]   CLINIC:  Medical Oncology/Hematology  PCP:  Orpah Greek, MD [address] [place] [address] [phone]   REASON FOR VISIT:  Small lymphocytic lymphoma.  CURRENT THERAPY: Work-up in progress.  INTERVAL HISTORY:  [name] 58 y.o. male seen for follow-up of small lymphocytic lymphoma.  He has been taking venetoclax 50 mg daily.  He reported aches in the shoulders.  He tried taking NSAIDs which did not help.  He reports having diarrhea once a day which is also self-contained.  Appetite is 50%.  Energy levels are 75%.  Denies any fevers or chills.  Denies any ER visits or hospitalizations.   REVIEW OF SYSTEMS:  Review of Systems  Musculoskeletal: Positive for arthralgias.  All other systems reviewed and are negative.    PAST MEDICAL/SURGICAL HISTORY:  Past Medical History:  Diagnosis Date  . CAD (coronary artery disease)   . GERD (gastroesophageal reflux disease)   . HTN (hypertension)   . Hypercholesterolemia   . MI (myocardial infarction) ([place]) 2003   Past Surgical History:  Procedure Laterality Date  . AXILLARY LYMPH NODE BIOPSY Left 04/27/2019   Procedure: AXILLARY LYMPH NODE BIOPSY;  Surgeon: Aviva Signs, MD;  Location: [facility] ORS;  Service: General;  Laterality: Left;  . BIOPSY  11/26/2018   Procedure: BIOPSY;  Surgeon: Daneil Dolin, MD;  Location: [facility] ENDO SUITE;  Service: Endoscopy;;  . CHOLECYSTECTOMY N/A 05/18/2019   Procedure: LAPAROSCOPIC CHOLECYSTECTOMY;  Surgeon: Aviva Signs, MD;  Location: [facility] ORS;  Service: General;  Laterality: N/A;  . CORONARY ANGIOPLASTY WITH STENT PLACEMENT     2003  . ESOPHAGOGASTRODUODENOSCOPY N/A 11/26/2018   mild erosive reflux esophagitis, gastric erythema. Negative H.pylori   . INGUINAL HERNIA REPAIR Right 04/27/2019   Procedure: HERNIA REPAIR INGUINAL ADULT;  Surgeon: Aviva Signs, MD;  Location: [facility] ORS;  Service: General;  Laterality: Right;    . KNEE SURGERY Right   . lumbar fusion with cage  07/2017  . NASAL SINUS SURGERY    . TONSILLECTOMY AND ADENOIDECTOMY       SOCIAL HISTORY:  Social History   Socioeconomic History  . Marital status: Divorced    Spouse name: Not on file  . Number of children: 2  . Years of education: Not on file  . Highest education level: Not on file  Occupational History  . 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 Smoking status: Current Every Day Smoker    Packs/day: 0.50    Years: 25.00    Pack years: 12.50    Types: Cigarettes  . Smokeless tobacco: Never Used  Substance and Sexual Activity  . Alcohol use: Yes    Comment: occ  . Drug use: Never  . Sexual activity: Yes  Other Topics Concern  . Not on file  Social History Narrative  . Not on file   Social Determinants of Health   Financial Resource Strain: Low Risk   . Difficulty of Paying Living Expenses: Not hard at all  Food Insecurity: No Food Insecurity  . Worried About Charity fundraiser in the Last Year: Never true  . Ran Out of Food in the Last Year: Never true  Transportation Needs: No Transportation Needs  . Lack of Transportation (Medical): No  . Lack of Transportation (Non-Medical): No  Physical Activity: Sufficiently Active  . Days of Exercise per Week: 4 days  .  Minutes of Exercise per Session: 60 min  Stress: No Stress Concern Present  . Feeling of Stress : Not at all  Social Connections: Slightly Isolated  . Frequency of Communication with Friends and Family: More than three times a week  . Frequency of Social Gatherings with Friends and Family: More than three times a week  . Attends Religious Services: More than 4 times per year  . Active Member of Clubs or Organizations: Yes  . Attends Archivist Meetings: 1 to 4 times per year  . Marital Status: Divorced  Human resources officer Violence: Not At Risk  . Fear of Current or Ex-Partner: No  . Emotionally Abused: No   . Physically Abused: No  . Sexually Abused: No    FAMILY HISTORY:  Family History  Problem Relation Age of Onset  . Colon polyps Father        thinks he may have had polyps, possibly in his 60s.   Patrick Rubio Heart attack Father   . Dementia Mother   . Hypotension Mother   . Stroke Sister   . Diabetes Maternal Grandmother   . Stroke Maternal Grandfather   . Cancer Paternal Grandmother   . Congestive Heart Failure Paternal Grandfather   . Kidney cancer Daughter   . Colon cancer Neg Hx   . Pancreatic cancer Neg Hx   . Liver disease Neg Hx     CURRENT MEDICATIONS:  Outpatient Encounter Medications as of 07/22/2019  Medication Sig  . allopurinol (ZYLOPRIM) 300 MG tablet TAKE 1 TABLET BY MOUTH EVERY DAY  . aspirin EC 81 MG tablet Take 81 mg by mouth 3 (three) times a week. Pt takes M,W,F  . clopidogrel (PLAVIX) 75 MG tablet Take 75 mg by mouth daily.   Patrick Rubio escitalopram (LEXAPRO) 20 MG tablet Take 20 mg by mouth daily.   Patrick Rubio gabapentin (NEURONTIN) 100 MG capsule Take 100 mg by mouth 2 (two) times daily as needed (pain).   Patrick Rubio lisinopril (PRINIVIL,ZESTRIL) 10 MG tablet Take 10 mg by mouth daily.   Patrick Rubio obinutuzumab in sodium chloride 0.9 % 250 mL Inject into the vein.  . pantoprazole (PROTONIX) 40 MG tablet Take 40 mg by mouth as needed.   . simvastatin (ZOCOR) 40 MG tablet Take 40 mg by mouth every other day.   . sulfamethoxazole-trimethoprim (BACTRIM DS) 800-160 MG tablet Take 1 tablet by mouth 3 (three) times a week.  . venetoclax 10 & 50 & 100 MG TBPK Take 20 mg once daily during week 1, 50 mg once daily during week 2, 100 mg once daily during week 3, 200 mg once daily during week 4. Continue escalation as instructed  . venetoclax 100 MG TABS Take 400 mg by mouth daily in the afternoon.  Patrick Rubio acyclovir (ZOVIRAX) 400 MG tablet Take 1 tablet (400 mg total) by mouth 2 (two) times daily. (Patient not taking: Reported on 07/22/2019)  . baclofen (LIORESAL) 10 MG tablet Take 10 mg by mouth 2 (two) times  daily as needed for muscle spasms.   . cimetidine (TAGAMET) 200 MG tablet Take 200 mg by mouth as needed.   . naproxen sodium (ALEVE) 220 MG tablet Take 220-440 mg by mouth 2 (two) times daily as needed (pain).   Patrick Rubio oxyCODONE-acetaminophen (PERCOCET) 7.5-325 MG tablet Take 1 tablet by mouth every 6 (six) hours as needed. (Patient not taking: Reported on 06/09/2019)   No facility-administered encounter medications on file as of 07/22/2019.    ALLERGIES:  No Known Allergies   PHYSICAL  EXAM:  ECOG Performance status: 1  Vitals:   07/22/19 1347  BP: (!) 126/92  Pulse: 77  Resp: 18  Temp: 97.7 F (36.5 C)  SpO2: 99%   Filed Weights   07/22/19 1347  Weight: 192 lb (87.1 kg)    Physical Exam Vitals reviewed.  Constitutional:      Appearance: Normal appearance.  Cardiovascular:     Rate and Rhythm: Normal rate and regular rhythm.     Heart sounds: Normal heart sounds.  Pulmonary:     Effort: Pulmonary effort is normal.     Breath sounds: Normal breath sounds.  Abdominal:     General: There is no distension.     Palpations: Abdomen is soft. There is no mass.  Musculoskeletal:        General: No swelling.  Skin:    General: Skin is warm.  Neurological:     General: No focal deficit present.     Mental Status: He is alert and oriented to person, place, and time.  Psychiatric:        Mood and Affect: Mood normal.        Behavior: Behavior normal.      LABORATORY DATA:  I have reviewed the labs as listed.  CBC    Component Value Date/Time   WBC 10.4 07/22/2019 1330   RBC 4.49 07/22/2019 1330   HGB 13.2 07/22/2019 1330   HCT 39.9 07/22/2019 1330   PLT 241 07/22/2019 1330   MCV 88.9 07/22/2019 1330   MCH 29.4 07/22/2019 1330   MCHC 33.1 07/22/2019 1330   RDW 14.0 07/22/2019 1330   LYMPHSABS 2.3 07/22/2019 1330   MONOABS 0.8 07/22/2019 1330   EOSABS 0.1 07/22/2019 1330   BASOSABS 0.1 07/22/2019 1330   CMP Latest Ref Rng & Units 07/22/2019 07/14/2019 07/07/2019   Glucose 70 - 99 mg/dL 80 83 68(L)  BUN 6 - 20 mg/dL _0 Creatinine 0.61 - 1.24 mg/dL 0.90 0.94 0.83  Sodium 135 - 145 mmol/L 136 136 139  Potassium 3.5 - 5.1 mmol/L 3.5 3.9 4.0  Chloride 98 - 111 mmol/L 104 103 108  CO2 22 - 32 mmol/L _1 Calcium 8.9 - 10.3 mg/dL 8.9 8.7(L) 8.7(L)  Total Protein 6.5 - 8.1 g/dL 6.7 6.8 6.4(L)  Total Bilirubin 0.3 - 1.2 mg/dL 0.7 0.7 0.6  Alkaline Phos 38 - 126 U/L 73 70 75  AST 15 - 41 U/L _2 ALT 0 - 44 U/L _3 DIAGNOSTIC IMAGING:  I have independently reviewed the scans and discussed with the patient.    ASSESSMENT & PLAN:   Small lymphocytic lymphoma ([place]) 1.  Clinical stage IIIb small lymphocytic lymphoma: - 56 pound weight loss in the last 1 and half year.  No fevers or night sweats. - Right upper quadrant pain for the past 4 months, worse on eating and better on lying on left side. -PET/CT scan on 04/13/2019 showed pathologically enlarged mesenteric and retroperitoneal adenopathy.  SUV ranged from 2.6-3.2.  Left external iliac lymph node 0.9 cm, SUV 2.  1.1 cm left axillary lymph node, SUV 2.1.  Accentuated activity in the left sternoclavicular joint, maximum SUV 5.3, likely secondary to mild arthropathy. - Left axillary lymph node biopsy on 04/27/2019 consistent with small lymphocytic lymphoma. -We discussed the results of bone marrow biopsy which showed involvement by the lymphoma.  Chromosome analysis was 46, XY.  CLL FISH panel showed  trisomy 85 which portends neutral prognosis. -T p53 mutation was not detected. -I GVH mutation analysis could not be reported, as analysis did not yield interpretable result.  Repeat sampling was recommended. -I GVH mutation cannot be done on the pathology block. -Obinutuzumab cycle 1 day 1 on 06/09/2019. -Cycle 2 of obinutuzumab on 07/07/2019. -He started venetoclax 20 mg on 07/07/2019.  He has tolerated very well. -He increased dose of venetoclax to 50 mg on 07/14/2019.  I have  reviewed his labs including tumor lysis labs which are within normal limits. -An echocardiogram at Dr. Ammie Ferrier office showed EF 50-55%.  Stage I diastolic dysfunction and impaired relaxation pattern was seen.  This was done on 07/21/2019. -He reported aches in the shoulders and lower back since he started venetoclax.  He tried taking ibuprofen 800 mg which did not help. -He was told to increase venetoclax 100 mg daily today.  He will further increase it to 200 mg daily next week. -I will see him back in 2 weeks with repeat labs prior to start of cycle 3.   2.  Weight loss: -He lost about 56 pounds since last 1 and half year.  He had gallbladder resected on 05/18/2019 which showed chronic cholecystitis. -He gained about 3 pounds since last visit.  He reportedly clears eating salads in the evenings.  He eats a small lunch  3.  TLS prophylaxis: -He will continue allopurinol 300 mg daily.  4.  Infection prophylaxis: -He will continue Bactrim 3 times a week.  We have discontinued acyclovir as it causes headaches.     Orders placed this encounter:  Orders Placed This Encounter  Procedures  . CBC with Differential/Platelet  . Comprehensive metabolic panel  . Magnesium  . Phosphorus  . Uric acid      Patrick Jack, MD [place] [phone]

## 2019-07-28 ENCOUNTER — Other Ambulatory Visit (HOSPITAL_COMMUNITY): Payer: Self-pay | Admitting: Hematology

## 2019-07-28 DIAGNOSIS — C8300 Small cell B-cell lymphoma, unspecified site: Secondary | ICD-10-CM

## 2019-08-04 ENCOUNTER — Encounter (HOSPITAL_COMMUNITY): Payer: Self-pay | Admitting: Hematology

## 2019-08-04 ENCOUNTER — Inpatient Hospital Stay (HOSPITAL_COMMUNITY): Payer: 59 | Attending: Hematology

## 2019-08-04 ENCOUNTER — Inpatient Hospital Stay (HOSPITAL_COMMUNITY): Payer: 59 | Admitting: Hematology

## 2019-08-04 ENCOUNTER — Inpatient Hospital Stay (HOSPITAL_COMMUNITY): Payer: 59

## 2019-08-04 ENCOUNTER — Other Ambulatory Visit: Payer: Self-pay

## 2019-08-04 VITALS — BP 132/78 | HR 66 | Temp 97.5°F | Resp 18 | Wt 196.4 lb

## 2019-08-04 VITALS — BP 127/86 | HR 63 | Temp 96.9°F | Resp 18

## 2019-08-04 DIAGNOSIS — Z79899 Other long term (current) drug therapy: Secondary | ICD-10-CM | POA: Insufficient documentation

## 2019-08-04 DIAGNOSIS — F17210 Nicotine dependence, cigarettes, uncomplicated: Secondary | ICD-10-CM | POA: Insufficient documentation

## 2019-08-04 DIAGNOSIS — Z5111 Encounter for antineoplastic chemotherapy: Secondary | ICD-10-CM | POA: Insufficient documentation

## 2019-08-04 DIAGNOSIS — R1011 Right upper quadrant pain: Secondary | ICD-10-CM | POA: Insufficient documentation

## 2019-08-04 DIAGNOSIS — I10 Essential (primary) hypertension: Secondary | ICD-10-CM | POA: Diagnosis not present

## 2019-08-04 DIAGNOSIS — C8300 Small cell B-cell lymphoma, unspecified site: Secondary | ICD-10-CM

## 2019-08-04 DIAGNOSIS — R634 Abnormal weight loss: Secondary | ICD-10-CM | POA: Diagnosis not present

## 2019-08-04 DIAGNOSIS — C9110 Chronic lymphocytic leukemia of B-cell type not having achieved remission: Secondary | ICD-10-CM | POA: Diagnosis present

## 2019-08-04 LAB — COMPREHENSIVE METABOLIC PANEL
ALT: 18 U/L (ref 0–44)
AST: 17 U/L (ref 15–41)
Albumin: 3.8 g/dL (ref 3.5–5.0)
Alkaline Phosphatase: 64 U/L (ref 38–126)
Anion gap: 8 (ref 5–15)
BUN: 19 mg/dL (ref 6–20)
CO2: 24 mmol/L (ref 22–32)
Calcium: 8.9 mg/dL (ref 8.9–10.3)
Chloride: 105 mmol/L (ref 98–111)
Creatinine, Ser: 0.81 mg/dL (ref 0.61–1.24)
GFR calc Af Amer: >60 mL/min (ref >60)
GFR calc non Af Amer: >60 mL/min (ref >60)
Glucose, Bld: 80 mg/dL (ref 70–99)
Potassium: 4.1 mmol/L (ref 3.5–5.1)
Sodium: 137 mmol/L (ref 135–145)
Total Bilirubin: 0.8 mg/dL (ref 0.3–1.2)
Total Protein: 6.5 g/dL (ref 6.5–8.1)

## 2019-08-04 LAB — URIC ACID: Uric Acid, Serum: 4.1 mg/dL (ref 3.7–8.6)

## 2019-08-04 LAB — CBC WITH DIFFERENTIAL/PLATELET
Abs Immature Granulocytes: 0.02 10*3/uL (ref 0.00–0.07)
Basophils Absolute: 0 10*3/uL (ref 0.0–0.1)
Basophils Relative: 0 %
Eosinophils Absolute: 0.1 10*3/uL (ref 0.0–0.5)
Eosinophils Relative: 1 %
HCT: 40.3 % (ref 39.0–52.0)
Hemoglobin: 13.2 g/dL (ref 13.0–17.0)
Immature Granulocytes: 0 %
Lymphocytes Relative: 27 %
Lymphs Abs: 2 10*3/uL (ref 0.7–4.0)
MCH: 29.5 pg (ref 26.0–34.0)
MCHC: 32.8 g/dL (ref 30.0–36.0)
MCV: 90 fL (ref 80.0–100.0)
Monocytes Absolute: 0.7 10*3/uL (ref 0.1–1.0)
Monocytes Relative: 9 %
Neutro Abs: 4.7 10*3/uL (ref 1.7–7.7)
Neutrophils Relative %: 63 %
Platelets: 231 10*3/uL (ref 150–400)
RBC: 4.48 MIL/uL (ref 4.22–5.81)
RDW: 13.9 % (ref 11.5–15.5)
WBC: 7.5 10*3/uL (ref 4.0–10.5)
nRBC: 0 % (ref 0.0–0.2)

## 2019-08-04 LAB — MAGNESIUM: Magnesium: 1.9 mg/dL (ref 1.7–2.4)

## 2019-08-04 LAB — PHOSPHORUS: Phosphorus: 3.5 mg/dL (ref 2.5–4.6)

## 2019-08-04 MED ORDER — SODIUM CHLORIDE 0.9 % IV SOLN
1000.0000 mg | Freq: Once | INTRAVENOUS | Status: AC
  Administered 2019-08-04: 1000 mg via INTRAVENOUS
  Filled 2019-08-04: qty 40

## 2019-08-04 MED ORDER — SODIUM CHLORIDE 0.9 % IV SOLN
20.0000 mg | Freq: Once | INTRAVENOUS | Status: AC
  Administered 2019-08-04: 20 mg via INTRAVENOUS
  Filled 2019-08-04: qty 20

## 2019-08-04 MED ORDER — FAMOTIDINE IN NACL 20-0.9 MG/50ML-% IV SOLN
20.0000 mg | Freq: Once | INTRAVENOUS | Status: AC
  Administered 2019-08-04: 20 mg via INTRAVENOUS

## 2019-08-04 MED ORDER — NAPRELAN 750 MG PO TB24: 1.0000 | ORAL_TABLET | Freq: Every day | ORAL | 2 refills | Status: DC

## 2019-08-04 MED ORDER — ACETAMINOPHEN 325 MG PO TABS
ORAL_TABLET | ORAL | Status: AC
  Filled 2019-08-04: qty 2

## 2019-08-04 MED ORDER — NAPROXEN 250 MG PO TABS
250.0000 mg | ORAL_TABLET | Freq: Once | ORAL | Status: AC
  Administered 2019-08-04: 250 mg via ORAL
  Filled 2019-08-04: qty 1

## 2019-08-04 MED ORDER — ACETAMINOPHEN 325 MG PO TABS
650.0000 mg | ORAL_TABLET | Freq: Once | ORAL | Status: AC
  Administered 2019-08-04: 650 mg via ORAL

## 2019-08-04 MED ORDER — DIPHENHYDRAMINE HCL 50 MG/ML IJ SOLN
INTRAMUSCULAR | Status: AC
  Filled 2019-08-04: qty 1

## 2019-08-04 MED ORDER — FAMOTIDINE IN NACL 20-0.9 MG/50ML-% IV SOLN
INTRAVENOUS | Status: AC
  Filled 2019-08-04: qty 50

## 2019-08-04 MED ORDER — DIPHENHYDRAMINE HCL 50 MG/ML IJ SOLN
50.0000 mg | Freq: Once | INTRAMUSCULAR | Status: AC
  Administered 2019-08-04: 50 mg via INTRAVENOUS

## 2019-08-04 MED ORDER — SODIUM CHLORIDE 0.9 % IV SOLN: Freq: Once | INTRAVENOUS | Status: AC

## 2019-08-04 NOTE — Progress Notes (Signed)
[place], [place] [address]   CLINIC:  Medical Oncology/Hematology  PCP:  Orpah Greek, MD [address], [address] [place] [address] [phone]   REASON FOR VISIT:  Small lymphocytic lymphoma.  CURRENT THERAPY: Venetoclax and Gazyva.  INTERVAL HISTORY:  Mr. Moltz 59 y.o. male seen for follow-up of small lymphocytic lymphoma and toxicity assessment prior to next cycle of Gazyva.  Reported red streaks in his arms after taking venetoclax which lasted few minutes along with itching.  He also reported musculoskeletal pains including multiple joint pains which did not improve with ibuprofen and Tylenol.  Denies any fevers or night sweats.  He gained about 4 pounds.   REVIEW OF SYSTEMS:  Review of Systems  Musculoskeletal: Positive for arthralgias.  All other systems reviewed and are negative.    PAST MEDICAL/SURGICAL HISTORY:  Past Medical History:  Diagnosis Date  . CAD (coronary artery disease)   . GERD (gastroesophageal reflux disease)   . HTN (hypertension)   . Hypercholesterolemia   . MI (myocardial infarction) ([place]) 2003   Past Surgical History:  Procedure Laterality Date  . AXILLARY LYMPH NODE BIOPSY Left 04/27/2019   Procedure: AXILLARY LYMPH NODE BIOPSY;  Surgeon: Aviva Signs, MD;  Location: [facility] ORS;  Service: General;  Laterality: Left;  . BIOPSY  11/26/2018   Procedure: BIOPSY;  Surgeon: Daneil Dolin, MD;  Location: [facility] ENDO SUITE;  Service: Endoscopy;;  . CHOLECYSTECTOMY N/A 05/18/2019   Procedure: LAPAROSCOPIC CHOLECYSTECTOMY;  Surgeon: Aviva Signs, MD;  Location: [facility] ORS;  Service: General;  Laterality: N/A;  . CORONARY ANGIOPLASTY WITH STENT PLACEMENT     2003  . ESOPHAGOGASTRODUODENOSCOPY N/A 11/26/2018   mild erosive reflux esophagitis, gastric erythema. Negative H.pylori   . INGUINAL HERNIA REPAIR Right 04/27/2019   Procedure: HERNIA REPAIR INGUINAL ADULT;  Surgeon: Aviva Signs, MD;  Location: [facility] ORS;   Service: General;  Laterality: Right;  . KNEE SURGERY Right   . lumbar fusion with cage  07/2017  . NASAL SINUS SURGERY    . TONSILLECTOMY AND ADENOIDECTOMY       SOCIAL HISTORY:  Social History   Socioeconomic History  . Marital status: Divorced    Spouse name: Not on file  . Number of children: 2  . Years of education: Not on file  . Highest education level: Not on file  Occupational History  . 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 Smoking status: Current Every Day Smoker    Packs/day: 0.50    Years: 25.00    Pack years: 12.50    Types: Cigarettes  . Smokeless tobacco: Never Used  Substance and Sexual Activity  . Alcohol use: Yes    Comment: occ  . Drug use: Never  . Sexual activity: Yes  Other Topics Concern  . Not on file  Social History Narrative  . Not on file   Social Determinants of Health   Financial Resource Strain: Low Risk   . Difficulty of Paying Living Expenses: Not hard at all  Food Insecurity: No Food Insecurity  . Worried About Charity fundraiser in the Last Year: Never true  . Ran Out of Food in the Last Year: Never true  Transportation Needs: No Transportation Needs  . Lack of Transportation (Medical): No  . Lack of Transportation (Non-Medical): No  Physical Activity: Sufficiently Active  . Days of Exercise per Week: 4 days  . Minutes of Exercise  per Session: 60 min  Stress: No Stress Concern Present  . Feeling of Stress : Not at all  Social Connections: Slightly Isolated  . Frequency of Communication with Friends and Family: More than three times a week  . Frequency of Social Gatherings with Friends and Family: More than three times a week  . Attends Religious Services: More than 4 times per year  . Active Member of Clubs or Organizations: Yes  . Attends Archivist Meetings: 1 to 4 times per year  . Marital Status: Divorced  Human resources officer Violence: Not At Risk  . Fear of Current or  Ex-Partner: No  . Emotionally Abused: No  . Physically Abused: No  . Sexually Abused: No    FAMILY HISTORY:  Family History  Problem Relation Age of Onset  . Colon polyps Father        thinks he may have had polyps, possibly in his 33s.   [name] Heart attack Father   . Dementia Mother   . Hypotension Mother   . Stroke Sister   . Diabetes Maternal Grandmother   . Stroke Maternal Grandfather   . Cancer Paternal Grandmother   . Congestive Heart Failure Paternal Grandfather   . Kidney cancer Daughter   . Colon cancer Neg Hx   . Pancreatic cancer Neg Hx   . Liver disease Neg Hx     CURRENT MEDICATIONS:  Outpatient Encounter Medications as of 08/04/2019  Medication Sig  . acyclovir (ZOVIRAX) 400 MG tablet Take 1 tablet (400 mg total) by mouth 2 (two) times daily.  [name] allopurinol (ZYLOPRIM) 300 MG tablet TAKE 1 TABLET BY MOUTH EVERY DAY  . aspirin EC 81 MG tablet Take 81 mg by mouth 3 (three) times a week. Pt takes M,W,F  . clopidogrel (PLAVIX) 75 MG tablet Take 75 mg by mouth daily.   [name] escitalopram (LEXAPRO) 20 MG tablet Take 20 mg by mouth daily.   [name] gabapentin (NEURONTIN) 100 MG capsule Take 100 mg by mouth 2 (two) times daily as needed (pain).   [name] lisinopril (PRINIVIL,ZESTRIL) 10 MG tablet Take 10 mg by mouth daily.   [name] obinutuzumab in sodium chloride 0.9 % 250 mL Inject into the vein.  . simvastatin (ZOCOR) 40 MG tablet Take 40 mg by mouth every other day.   . sulfamethoxazole-trimethoprim (BACTRIM DS) 800-160 MG tablet Take 1 tablet by mouth 3 (three) times a week.  . venetoclax 10 & 50 & 100 MG TBPK Take 20 mg once daily during week 1, 50 mg once daily during week 2, 100 mg once daily during week 3, 200 mg once daily during week 4. Continue escalation as instructed  . venetoclax 100 MG TABS Take 400 mg by mouth daily in the afternoon.  . baclofen (LIORESAL) 10 MG tablet Take 10 mg by mouth 2 (two) times daily as needed for muscle spasms.   . cimetidine (TAGAMET) 200 MG tablet Take  200 mg by mouth as needed.   . naproxen sodium (ALEVE) 220 MG tablet Take 220-440 mg by mouth 2 (two) times daily as needed (pain).   . Naproxen Sodium (NAPRELAN) 750 MG TB24 Take 1 tablet (750 mg total) by mouth daily.  [name] oxyCODONE-acetaminophen (PERCOCET) 7.5-325 MG tablet Take 1 tablet by mouth every 6 (six) hours as needed. (Patient not taking: Reported on 06/09/2019)  . pantoprazole (PROTONIX) 40 MG tablet Take 40 mg by mouth as needed.    No facility-administered encounter medications on file as of 08/04/2019.  ALLERGIES:  No Known Allergies   PHYSICAL EXAM:  ECOG Performance status: 1  Vitals:   08/04/19 0811 08/04/19 0819  BP: 132/78 132/78  Pulse: 66 66  Resp: 18 18  Temp: (!) 97.5 F (36.4 C) (!) 97.5 F (36.4 C)  SpO2: 99% 99%   Filed Weights   08/04/19 0811 08/04/19 0819  Weight: 196 lb 6.4 oz (89.1 kg) 196 lb 6.4 oz (89.1 kg)    Physical Exam Vitals reviewed.  Constitutional:      Appearance: Normal appearance.  Cardiovascular:     Rate and Rhythm: Normal rate and regular rhythm.     Heart sounds: Normal heart sounds.  Pulmonary:     Effort: Pulmonary effort is normal.     Breath sounds: Normal breath sounds.  Abdominal:     General: There is no distension.     Palpations: Abdomen is soft. There is no mass.  Musculoskeletal:        General: No swelling.  Skin:    General: Skin is warm.  Neurological:     General: No focal deficit present.     Mental Status: He is alert and oriented to person, place, and time.  Psychiatric:        Mood and Affect: Mood normal.        Behavior: Behavior normal.      LABORATORY DATA:  I have reviewed the labs as listed.  CBC    Component Value Date/Time   WBC 7.5 08/04/2019 0802   RBC 4.48 08/04/2019 0802   HGB 13.2 08/04/2019 0802   HCT 40.3 08/04/2019 0802   PLT 231 08/04/2019 0802   MCV 90.0 08/04/2019 0802   MCH 29.5 08/04/2019 0802   MCHC 32.8 08/04/2019 0802   RDW 13.9 08/04/2019 0802   LYMPHSABS  2.0 08/04/2019 0802   MONOABS 0.7 08/04/2019 0802   EOSABS 0.1 08/04/2019 0802   BASOSABS 0.0 08/04/2019 0802   CMP Latest Ref Rng & Units 08/04/2019 07/22/2019 07/14/2019  Glucose 70 - 99 mg/dL 80 80 83  BUN 6 - 20 mg/dL '19 13 12  '$ Creatinine 0.61 - 1.24 mg/dL 0.81 0.90 0.94  Sodium 135 - 145 mmol/L 137 136 136  Potassium 3.5 - 5.1 mmol/L 4.1 3.5 3.9  Chloride 98 - 111 mmol/L 105 104 103  CO2 22 - 32 mmol/L '24 23 25  '$ Calcium 8.9 - 10.3 mg/dL 8.9 8.9 8.7(L)  Total Protein 6.5 - 8.1 g/dL 6.5 6.7 6.8  Total Bilirubin 0.3 - 1.2 mg/dL 0.8 0.7 0.7  Alkaline Phos 38 - 126 U/L 64 73 70  AST 15 - 41 U/L '17 23 17  '$ ALT 0 - 44 U/L '18 20 16       '$ DIAGNOSTIC IMAGING:  I have independently reviewed the scans and discussed with the patient.    ASSESSMENT & PLAN:   Small lymphocytic lymphoma ([place]) 1.  Clinical stage IIIb small lymphocytic lymphoma: - 56 pound weight loss in the last 1 and half year.  No fevers or night sweats. - Right upper quadrant pain for the past 4 months, worse on eating and better on lying on left side. -PET/CT scan on 04/13/2019 showed pathologically enlarged mesenteric and retroperitoneal adenopathy.  SUV ranged from 2.6-3.2.  Left external iliac lymph node 0.9 cm, SUV 2.  1.1 cm left axillary lymph node, SUV 2.1.  Accentuated activity in the left sternoclavicular joint, maximum SUV 5.3, likely secondary to mild arthropathy. - Left axillary lymph node biopsy on 04/27/2019 consistent  with small lymphocytic lymphoma. -We discussed the results of bone marrow biopsy which showed involvement by the lymphoma.  Chromosome analysis was 46, XY.  CLL FISH panel showed trisomy 12 which portends neutral prognosis. -T p53 mutation was not detected. -I GVH mutation analysis could not be reported, as analysis did not yield interpretable result.  Repeat sampling was recommended. -I GVH mutation cannot be done on the pathology block. -Obinutuzumab cycle 1 day 1 on 06/09/2019. -Cycle 2 of  obinutuzumab on 07/07/2019. -Venetoclax ramp up started on 07/07/2019. -He took his last tablet of venetoclax 200 mg today.  He reported noticing great streaks in his arms which last 2 to 4 minutes, 45 minutes after taking venetoclax.  They occasionally itch.  I did not see any lesions today. -I have reviewed his blood work which is grossly within normal limits.  We will proceed with his cycle 3 of obinutuzumab. -He will increase venetoclax to 400 mg daily tomorrow. -I will see him back in 2 weeks for follow-up.   2.  Weight loss: -He lost 56 pounds since last 1 year. -He gained back about 4 pounds in the last 2 weeks.  His eating picked up during the holidays.  3.  TLS prophylaxis: -He will continue allopurinol 300 mg daily.  4.  Infection prophylaxis: -He will continue Bactrim 3 times a week.  We discontinued acyclovir secondary to headaches.  5.  Musculoskeletal pains: -She reported having shoulder pains, knee pains and hip pains hurting all the time since he started venetoclax. -He took ibuprofen and Tylenol which did not help. -I will start him on long-acting Naprosyn 750 mg daily.     Orders placed this encounter:  Orders Placed This Encounter  Procedures  . CBC with Differential/Platelet  . Comprehensive metabolic panel  . Magnesium  . Phosphorus  . Uric acid      Derek Jack, MD [place] [phone]

## 2019-08-04 NOTE — Progress Notes (Signed)
08/04/19  Give Naproxen 250 mg po x 1 during infusion today for joint pains.  Patient to pick up RX on way home.  Order entered.  T.O. Dr Maye Hides RN/Naome Brigandi Ronnald Ramp, PharmD

## 2019-08-04 NOTE — Progress Notes (Signed)
Patient presents today for follow up visit with Dr. Delton Coombes and treatment. Labs pending. Patient has no complaints of any pain today. Patient states a change since his last visit is streaking on his left arm after he takes 200mg  of Venetoclax. Dr. Delton Coombes at the bedside and patient reported to MD. MD aware.   Message received to proceed with treatment from [facility] LPN.   Treatment given today per MD orders. Tolerated infusion without adverse affects. Vital signs stable. No complaints at this time. Discharged from clinic ambulatory. F/U with [facility] as scheduled.

## 2019-08-04 NOTE — Patient Instructions (Addendum)
[place] at [facility] Discharge Instructions  You were seen today by Dr. Delton Coombes. He went over your recent lab results. Increase Venetoclax to 4 tablets starting tomorrow. Take benadryl if you have any rash or itching. He sent a new prescription to your pharmacy for an extended release Naproxen. He will see you back in 2 weeks for labs and follow up.   Thank you for choosing [place] at [facility] to provide your oncology and hematology care.  To afford each patient quality time with our provider, please arrive at least 15 minutes before your scheduled appointment time.   If you have a lab appointment with the [facility] please come in thru the  Main Entrance and check in at the main information desk  You need to re-schedule your appointment should you arrive 10 or more minutes late.  We strive to give you quality time with our providers, and arriving late affects you and other patients whose appointments are after yours.  Also, if you no show three or more times for appointments you may be dismissed from the clinic at the providers discretion.     Again, thank you for choosing [facility].  Our hope is that these requests will decrease the amount of time that you wait before being seen by our physicians.       _____________________________________________________________  Should you have questions after your visit to [facility], please contact our office at [phone] between the hours of 8:00 a.m. and 4:30 p.m.  Voicemails left after 4:00 p.m. will not be returned until the following business day.  For prescription refill requests, have your pharmacy contact our office and allow 72 hours.    Cancer Center Support Programs:   > Cancer Support Group  2nd Tuesday of the month 1pm-2pm, Journey Room

## 2019-08-04 NOTE — Assessment & Plan Note (Signed)
1.  Clinical stage IIIb small lymphocytic lymphoma: - 56 pound weight loss in the last 1 and half year.  No fevers or night sweats. - Right upper quadrant pain for the past 4 months, worse on eating and better on lying on left side. -PET/CT scan on 04/13/2019 showed pathologically enlarged mesenteric and retroperitoneal adenopathy.  SUV ranged from 2.6-3.2.  Left external iliac lymph node 0.9 cm, SUV 2.  1.1 cm left axillary lymph node, SUV 2.1.  Accentuated activity in the left sternoclavicular joint, maximum SUV 5.3, likely secondary to mild arthropathy. - Left axillary lymph node biopsy on 04/27/2019 consistent with small lymphocytic lymphoma. -We discussed the results of bone marrow biopsy which showed involvement by the lymphoma.  Chromosome analysis was 46, XY.  CLL FISH panel showed trisomy 12 which portends neutral prognosis. -T p53 mutation was not detected. -I GVH mutation analysis could not be reported, as analysis did not yield interpretable result.  Repeat sampling was recommended. -I GVH mutation cannot be done on the pathology block. -Obinutuzumab cycle 1 day 1 on 06/09/2019. -Cycle 2 of obinutuzumab on 07/07/2019. -Venetoclax ramp up started on 07/07/2019. -He took his last tablet of venetoclax 200 mg today.  He reported noticing great streaks in his arms which last 2 to 4 minutes, 45 minutes after taking venetoclax.  They occasionally itch.  I did not see any lesions today. -I have reviewed his blood work which is grossly within normal limits.  We will proceed with his cycle 3 of obinutuzumab. -He will increase venetoclax to 400 mg daily tomorrow. -I will see him back in 2 weeks for follow-up.   2.  Weight loss: -He lost 56 pounds since last 1 year. -He gained back about 4 pounds in the last 2 weeks.  His eating picked up during the holidays.  3.  TLS prophylaxis: -He will continue allopurinol 300 mg daily.  4.  Infection prophylaxis: -He will continue Bactrim 3 times a week.   We discontinued acyclovir secondary to headaches.  5.  Musculoskeletal pains: -She reported having shoulder pains, knee pains and hip pains hurting all the time since he started venetoclax. -He took ibuprofen and Tylenol which did not help. -I will start him on long-acting Naprosyn 750 mg daily.

## 2019-08-04 NOTE — Patient Instructions (Signed)
[facility] Discharge Instructions for Patients Receiving Chemotherapy  Today you received the following chemotherapy agents   To help prevent nausea and vomiting after your treatment, we encourage you to take your nausea medication   If you develop nausea and vomiting that is not controlled by your nausea medication, call the clinic.   BELOW ARE SYMPTOMS THAT SHOULD BE REPORTED IMMEDIATELY:  *FEVER GREATER THAN 100.5 F  *CHILLS WITH OR WITHOUT FEVER  NAUSEA AND VOMITING THAT IS NOT CONTROLLED WITH YOUR NAUSEA MEDICATION  *UNUSUAL SHORTNESS OF BREATH  *UNUSUAL BRUISING OR BLEEDING  TENDERNESS IN MOUTH AND THROAT WITH OR WITHOUT PRESENCE OF ULCERS  *URINARY PROBLEMS  *BOWEL PROBLEMS  UNUSUAL RASH Items with * indicate a potential emergency and should be followed up as soon as possible.  Feel free to call the clinic should you have any questions or concerns. The clinic phone number is [phone].  Please show the CHEMO ALERT CARD at check-in to the Emergency Department and triage nurse.   

## 2019-08-06 ENCOUNTER — Telehealth (HOSPITAL_COMMUNITY): Payer: Self-pay | Admitting: *Deleted

## 2019-08-06 NOTE — Telephone Encounter (Signed)
I called pt to let him know his insurance denied his prescription for Naprelan 750 mg. Pt advised to continue to use Naproxen and let the clinic know if Naproxen doesn't help. Pt verbalized understanding.

## 2019-08-20 ENCOUNTER — Encounter (HOSPITAL_COMMUNITY): Payer: Self-pay | Admitting: Hematology

## 2019-08-20 ENCOUNTER — Inpatient Hospital Stay (HOSPITAL_COMMUNITY): Payer: 59 | Admitting: Hematology

## 2019-08-20 ENCOUNTER — Other Ambulatory Visit: Payer: Self-pay

## 2019-08-20 ENCOUNTER — Inpatient Hospital Stay (HOSPITAL_COMMUNITY): Payer: 59

## 2019-08-20 VITALS — BP 124/83 | HR 60 | Temp 97.5°F | Resp 18 | Wt 193.0 lb

## 2019-08-20 DIAGNOSIS — C9110 Chronic lymphocytic leukemia of B-cell type not having achieved remission: Secondary | ICD-10-CM | POA: Diagnosis not present

## 2019-08-20 DIAGNOSIS — C8300 Small cell B-cell lymphoma, unspecified site: Secondary | ICD-10-CM | POA: Diagnosis not present

## 2019-08-20 LAB — CBC WITH DIFFERENTIAL/PLATELET
Abs Immature Granulocytes: 0.01 10*3/uL (ref 0.00–0.07)
Basophils Absolute: 0 10*3/uL (ref 0.0–0.1)
Basophils Relative: 0 %
Eosinophils Absolute: 0 10*3/uL (ref 0.0–0.5)
Eosinophils Relative: 0 %
HCT: 38.6 % — ABNORMAL LOW (ref 39.0–52.0)
Hemoglobin: 12.8 g/dL — ABNORMAL LOW (ref 13.0–17.0)
Immature Granulocytes: 0 %
Lymphocytes Relative: 31 %
Lymphs Abs: 1.7 10*3/uL (ref 0.7–4.0)
MCH: 29.5 pg (ref 26.0–34.0)
MCHC: 33.2 g/dL (ref 30.0–36.0)
MCV: 88.9 fL (ref 80.0–100.0)
Monocytes Absolute: 0.7 10*3/uL (ref 0.1–1.0)
Monocytes Relative: 13 %
Neutro Abs: 2.9 10*3/uL (ref 1.7–7.7)
Neutrophils Relative %: 56 %
Platelets: 243 10*3/uL (ref 150–400)
RBC: 4.34 MIL/uL (ref 4.22–5.81)
RDW: 13.4 % (ref 11.5–15.5)
WBC: 5.3 10*3/uL (ref 4.0–10.5)
nRBC: 0 % (ref 0.0–0.2)

## 2019-08-20 LAB — COMPREHENSIVE METABOLIC PANEL
ALT: 16 U/L (ref 0–44)
AST: 15 U/L (ref 15–41)
Albumin: 3.8 g/dL (ref 3.5–5.0)
Alkaline Phosphatase: 81 U/L (ref 38–126)
Anion gap: 9 (ref 5–15)
BUN: 15 mg/dL (ref 6–20)
CO2: 24 mmol/L (ref 22–32)
Calcium: 8.9 mg/dL (ref 8.9–10.3)
Chloride: 105 mmol/L (ref 98–111)
Creatinine, Ser: 0.79 mg/dL (ref 0.61–1.24)
GFR calc Af Amer: >60 mL/min (ref >60)
GFR calc non Af Amer: >60 mL/min (ref >60)
Glucose, Bld: 88 mg/dL (ref 70–99)
Potassium: 3.9 mmol/L (ref 3.5–5.1)
Sodium: 138 mmol/L (ref 135–145)
Total Bilirubin: 0.9 mg/dL (ref 0.3–1.2)
Total Protein: 6.8 g/dL (ref 6.5–8.1)

## 2019-08-20 LAB — URIC ACID: Uric Acid, Serum: 4.3 mg/dL (ref 3.7–8.6)

## 2019-08-20 LAB — MAGNESIUM: Magnesium: 2 mg/dL (ref 1.7–2.4)

## 2019-08-20 LAB — PHOSPHORUS: Phosphorus: 3.7 mg/dL (ref 2.5–4.6)

## 2019-08-20 MED ORDER — MELOXICAM 7.5 MG PO TABS: 7.5000 mg | ORAL_TABLET | Freq: Every day | ORAL | 2 refills | Status: DC

## 2019-08-20 NOTE — Patient Instructions (Addendum)
[place] at [facility] Discharge Instructions  You were seen today by Dr. Delton Coombes. He went over your recent lab results. He sent in Mobic and Hydrocodone for your joint pain. He will see you back in 2 weeks for labs, treatment and follow up.   Thank you for choosing [place] at [facility] to provide your oncology and hematology care.  To afford each patient quality time with our provider, please arrive at least 15 minutes before your scheduled appointment time.   If you have a lab appointment with the [place] please come in thru the  Main Entrance and check in at the main information desk  You need to re-schedule your appointment should you arrive 10 or more minutes late.  We strive to give you quality time with our providers, and arriving late affects you and other patients whose appointments are after yours.  Also, if you no show three or more times for appointments you may be dismissed from the clinic at the providers discretion.     Again, thank you for choosing [facility].  Our hope is that these requests will decrease the amount of time that you wait before being seen by our physicians.       _____________________________________________________________  Should you have questions after your visit to [facility], please contact our office at [phone] between the hours of 8:00 a.m. and 4:30 p.m.  Voicemails left after 4:00 p.m. will not be returned until the following business day.  For prescription refill requests, have your pharmacy contact our office and allow 72 hours.    Cancer Center Support Programs:   > Cancer Support Group  2nd Tuesday of the month 1pm-2pm, Journey Room

## 2019-08-20 NOTE — Progress Notes (Signed)
[name],  [id number]   CLINIC:  Medical Oncology/Hematology  PCP:  Orpah Greek, MD [address] [place] [address] [phone]   REASON FOR VISIT:  Small lymphocytic lymphoma.  CURRENT THERAPY: Venetoclax and Gazyva.  INTERVAL HISTORY:  Patrick Rubio 59 y.o. male seen for follow-up of small lymphocytic lymphoma and toxicity assessment.  Reports severe joint pains rated as 8 out of 10 since we increase the dose of venetoclax to 400 mg daily.  Appetite and energy levels are 50%.  He is reportedly taking 4 tablets of Aleve daily.  Aleve brings the pain down to 6 out of 10.  Long-acting Naprosyn was rejected by his insurance.  He lost 3 pounds since last visit.  Denies any fevers or chills.   REVIEW OF SYSTEMS:  Review of Systems  Musculoskeletal: Positive for arthralgias.  All other systems reviewed and are negative.    PAST MEDICAL/SURGICAL HISTORY:  Past Medical History:  Diagnosis Date  . CAD (coronary artery disease)   . GERD (gastroesophageal reflux disease)   . HTN (hypertension)   . Hypercholesterolemia   . MI (myocardial infarction) ([place]) 2003   Past Surgical History:  Procedure Laterality Date  . AXILLARY LYMPH NODE BIOPSY Left 04/27/2019   Procedure: AXILLARY LYMPH NODE BIOPSY;  Surgeon: Aviva Signs, MD;  Location: [facility];  Service: General;  Laterality: Left;  . BIOPSY  11/26/2018   Procedure: BIOPSY;  Surgeon: Daneil Dolin, MD;  Location: [facility];  Service: Endoscopy;;  . CHOLECYSTECTOMY N/A 05/18/2019   Procedure: LAPAROSCOPIC CHOLECYSTECTOMY;  Surgeon: Aviva Signs, MD;  Location: [facility];  Service: General;  Laterality: N/A;  . CORONARY ANGIOPLASTY WITH STENT PLACEMENT     2003  . ESOPHAGOGASTRODUODENOSCOPY N/A 11/26/2018   mild erosive reflux esophagitis, gastric erythema. Negative H.pylori   . INGUINAL HERNIA REPAIR Right 04/27/2019   Procedure: HERNIA REPAIR INGUINAL ADULT;  Surgeon:  Aviva Signs, MD;  Location: [facility];  Service: General;  Laterality: Right;  . KNEE SURGERY Right   . lumbar fusion with cage  07/2017  . NASAL SINUS SURGERY    . TONSILLECTOMY AND ADENOIDECTOMY       SOCIAL HISTORY:  Social History   Socioeconomic History  . Marital status: Divorced    Spouse name: Not on file  . Number of children: 2  . Years of education: Not on file  . Highest education level: Not on file  Occupational History  . 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 Smoking status: Current Every Day Smoker    Packs/day: 0.50    Years: 25.00    Pack years: 12.50    Types: Cigarettes  . Smokeless tobacco: Never Used  Substance and Sexual Activity  . Alcohol use: Yes    Comment: occ  . Drug use: Never  . Sexual activity: Yes  Other Topics Concern  . Not on file  Social History Narrative  . Not on file   Social Determinants of Health   Financial Resource Strain: Low Risk   . Difficulty of Paying Living Expenses: Not hard at all  Food Insecurity: No Food Insecurity  . Worried About Charity fundraiser in the Last Year: Never true  . Ran Out of Food in the Last Year: Never true  Transportation Needs: No Transportation Needs  . Lack of Transportation (Medical): No  . Lack of Transportation (Non-Medical): No  Physical Activity: Sufficiently Active  . Days of Exercise per Week: 4 days  . Minutes of Exercise per Session: 60 min  Stress: No Stress Concern Present  . Feeling of Stress : Not at all  Social Connections: Slightly Isolated  . Frequency of Communication with Friends and Family: More than three times a week  . Frequency of Social Gatherings with Friends and Family: More than three times a week  . Attends Religious Services: More than 4 times per year  . Active Member of Clubs or Organizations: Yes  . Attends Archivist Meetings: 1 to 4 times per year  . Marital Status: Divorced  Human resources officer Violence:  Not At Risk  . Fear of Current or Ex-Partner: No  . Emotionally Abused: No  . Physically Abused: No  . Sexually Abused: No    FAMILY HISTORY:  Family History  Problem Relation Age of Onset  . Colon polyps Father        thinks he may have had polyps, possibly in his 35s.   [name] Heart attack Father   . Dementia Mother   . Hypotension Mother   . Stroke Sister   . Diabetes Maternal Grandmother   . Stroke Maternal Grandfather   . Cancer Paternal Grandmother   . Congestive Heart Failure Paternal Grandfather   . Kidney cancer Daughter   . Colon cancer Neg Hx   . Pancreatic cancer Neg Hx   . Liver disease Neg Hx     CURRENT MEDICATIONS:  Outpatient Encounter Medications as of 08/20/2019  Medication Sig  . acyclovir (ZOVIRAX) 400 MG tablet Take 1 tablet (400 mg total) by mouth 2 (two) times daily.  [name] allopurinol (ZYLOPRIM) 300 MG tablet TAKE 1 TABLET BY MOUTH EVERY DAY  . aspirin EC 81 MG tablet Take 81 mg by mouth 3 (three) times a week. Pt takes M,W,F  . clopidogrel (PLAVIX) 75 MG tablet Take 75 mg by mouth daily.   [name] escitalopram (LEXAPRO) 20 MG tablet Take 20 mg by mouth daily.   [name] lisinopril (PRINIVIL,ZESTRIL) 10 MG tablet Take 10 mg by mouth daily.   . naproxen sodium (ALEVE) 220 MG tablet Take 220-440 mg by mouth 2 (two) times daily as needed (pain).   [name] obinutuzumab in sodium chloride 0.9 % 250 mL Inject into the vein.  . pantoprazole (PROTONIX) 40 MG tablet Take 40 mg by mouth as needed.   . simvastatin (ZOCOR) 40 MG tablet Take 40 mg by mouth every other day.   . sulfamethoxazole-trimethoprim (BACTRIM DS) 800-160 MG tablet Take 1 tablet by mouth 3 (three) times a week.  . venetoclax 100 MG TABS Take 400 mg by mouth daily in the afternoon.  . [DISCONTINUED] venetoclax 10 & 50 & 100 MG TBPK Take 20 mg once daily during week 1, 50 mg once daily during week 2, 100 mg once daily during week 3, 200 mg once daily during week 4. Continue escalation as instructed  . baclofen (LIORESAL)  10 MG tablet Take 10 mg by mouth 2 (two) times daily as needed for muscle spasms.   . cimetidine (TAGAMET) 200 MG tablet Take 200 mg by mouth as needed.   . gabapentin (NEURONTIN) 100 MG capsule Take 100 mg by mouth 2 (two) times daily as needed (pain).   [name] HYDROcodone-acetaminophen (NORCO/VICODIN) 5-325 MG tablet Take 1 tablet by mouth every 8 (eight) hours as needed for moderate pain.  . meloxicam (MOBIC) 7.5 MG tablet Take 1 tablet (7.5 mg total) by mouth  daily.  . oxyCODONE-acetaminophen (PERCOCET) 7.5-325 MG tablet Take 1 tablet by mouth every 6 (six) hours as needed. (Patient not taking: Reported on 06/09/2019)  . [DISCONTINUED] Naproxen Sodium (NAPRELAN) 750 MG TB24 Take 1 tablet (750 mg total) by mouth daily.   No facility-administered encounter medications on file as of 08/20/2019.    ALLERGIES:  No Known Allergies   PHYSICAL EXAM:  ECOG Performance status: 1  Vitals:   08/20/19 1051  BP: 124/83  Pulse: 60  Resp: 18  Temp: (!) 97.5 F (36.4 C)  SpO2: 99%   Filed Weights   08/20/19 1051  Weight: 193 lb (87.5 kg)    Physical Exam Vitals reviewed.  Constitutional:      Appearance: Normal appearance.  Cardiovascular:     Rate and Rhythm: Normal rate and regular rhythm.     Heart sounds: Normal heart sounds.  Pulmonary:     Effort: Pulmonary effort is normal.     Breath sounds: Normal breath sounds.  Abdominal:     General: There is no distension.     Palpations: Abdomen is soft. There is no mass.  Musculoskeletal:        General: No swelling.  Skin:    General: Skin is warm.  Neurological:     General: No focal deficit present.     Mental Status: He is alert and oriented to person, place, and time.  Psychiatric:        Mood and Affect: Mood normal.        Behavior: Behavior normal.      LABORATORY DATA:  I have reviewed the labs as listed.  CBC    Component Value Date/Time   WBC 5.3 08/20/2019 1012   RBC 4.34 08/20/2019 1012   HGB 12.8 (L)  08/20/2019 1012   HCT 38.6 (L) 08/20/2019 1012   PLT 243 08/20/2019 1012   MCV 88.9 08/20/2019 1012   MCH 29.5 08/20/2019 1012   MCHC 33.2 08/20/2019 1012   RDW 13.4 08/20/2019 1012   LYMPHSABS 1.7 08/20/2019 1012   MONOABS 0.7 08/20/2019 1012   EOSABS 0.0 08/20/2019 1012   BASOSABS 0.0 08/20/2019 1012   CMP Latest Ref Rng & Units 08/20/2019 08/04/2019 07/22/2019  Glucose 70 - 99 mg/dL 88 80 80  BUN 6 - 20 mg/dL '15 19 13  '$ Creatinine 0.61 - 1.24 mg/dL 0.79 0.81 0.90  Sodium 135 - 145 mmol/L 138 137 136  Potassium 3.5 - 5.1 mmol/L 3.9 4.1 3.5  Chloride 98 - 111 mmol/L 105 105 104  CO2 22 - 32 mmol/L '24 24 23  '$ Calcium 8.9 - 10.3 mg/dL 8.9 8.9 8.9  Total Protein 6.5 - 8.1 g/dL 6.8 6.5 6.7  Total Bilirubin 0.3 - 1.2 mg/dL 0.9 0.8 0.7  Alkaline Phos 38 - 126 U/L 81 64 73  AST 15 - 41 U/L '15 17 23  '$ ALT 0 - 44 U/L '16 18 20       '$ DIAGNOSTIC IMAGING:  I have independently reviewed the scans and discussed with the patient.    ASSESSMENT & PLAN:   Small lymphocytic lymphoma ([place]) 1.  Clinical stage IIIb small lymphocytic lymphoma: - 56 pound weight loss in the last 1 and half year.  No fevers or night sweats. - Right upper quadrant pain for the past 4 months, worse on eating and better on lying on left side. -PET/CT scan on 04/13/2019 showed pathologically enlarged mesenteric and retroperitoneal adenopathy.  SUV ranged from 2.6-3.2.  Left external iliac lymph node  0.9 cm, SUV 2.  1.1 cm left axillary lymph node, SUV 2.1.  Accentuated activity in the left sternoclavicular joint, maximum SUV 5.3, likely secondary to mild arthropathy. - Left axillary lymph node biopsy on 04/27/2019 consistent with small lymphocytic lymphoma. -We discussed the results of bone marrow biopsy which showed involvement by the lymphoma.  Chromosome analysis was 46, XY.  CLL FISH panel showed trisomy 12 which portends neutral prognosis. -T p53 mutation was not detected. -I GVH mutation analysis could not be  reported, as analysis did not yield interpretable result.  Repeat sampling was recommended. -I GVH mutation cannot be done on the pathology block. -Obinutuzumab cycle 1 day 1 on 06/09/2019. -Cycle 2 of obinutuzumab on 07/07/2019. -Venetoclax ramp up started on 07/07/2019. -Venetoclax was increased to 400 mg daily on 08/04/2019.  Cycle 3 of Gazyva on 08/04/2019. -He lost 3 pounds in the last 2 weeks.  We reviewed his labs.  White count is 5.3 with platelet count 243. -I will see him back in 2 weeks for follow-up.   2.  Weight loss: -He lost 56 pounds since last 1 year. -He gained back about 4 pounds in the last 2 weeks.  His eating picked up during the holidays.  3.  TLS prophylaxis: -He will continue allopurinol 300 mg daily.  4.  Infection prophylaxis: -He will continue Bactrim 3 times a week.  He cannot tolerate acyclovir secondary to headaches.  5.  Arthralgias: -Since we increased her venetoclax to 400 mg daily, he reports 8 out of 10 pain in the knees, shoulders and elbows.  We have tried to get him on long-acting Naprosyn.  His insurance would not cover it. -We will send a prescription for Mobic 7.5 mg once daily.  I have also given a prescription for hydrocodone 5/325 mg to be taken as needed as a backup to Mobic.     Orders placed this encounter:  No orders of the defined types were placed in this encounter.     Derek Jack, MD [place] [phone]

## 2019-08-23 MED ORDER — HYDROCODONE-ACETAMINOPHEN 5-325 MG PO TABS: 1.0000 | ORAL_TABLET | Freq: Three times a day (TID) | ORAL | 0 refills | Status: DC | PRN

## 2019-08-23 NOTE — Assessment & Plan Note (Signed)
1.  Clinical stage IIIb small lymphocytic lymphoma: - 56 pound weight loss in the last 1 and half year.  No fevers or night sweats. - Right upper quadrant pain for the past 4 months, worse on eating and better on lying on left side. -PET/CT scan on 04/13/2019 showed pathologically enlarged mesenteric and retroperitoneal adenopathy.  SUV ranged from 2.6-3.2.  Left external iliac lymph node 0.9 cm, SUV 2.  1.1 cm left axillary lymph node, SUV 2.1.  Accentuated activity in the left sternoclavicular joint, maximum SUV 5.3, likely secondary to mild arthropathy. - Left axillary lymph node biopsy on 04/27/2019 consistent with small lymphocytic lymphoma. -We discussed the results of bone marrow biopsy which showed involvement by the lymphoma.  Chromosome analysis was 46, XY.  CLL FISH panel showed trisomy 12 which portends neutral prognosis. -T p53 mutation was not detected. -I GVH mutation analysis could not be reported, as analysis did not yield interpretable result.  Repeat sampling was recommended. -I GVH mutation cannot be done on the pathology block. -Obinutuzumab cycle 1 day 1 on 06/09/2019. -Cycle 2 of obinutuzumab on 07/07/2019. -Venetoclax ramp up started on 07/07/2019. -Venetoclax was increased to 400 mg daily on 08/04/2019.  Cycle 3 of Gazyva on 08/04/2019. -He lost 3 pounds in the last 2 weeks.  We reviewed his labs.  White count is 5.3 with platelet count 243. -I will see him back in 2 weeks for follow-up.   2.  Weight loss: -He lost 56 pounds since last 1 year. -He gained back about 4 pounds in the last 2 weeks.  His eating picked up during the holidays.  3.  TLS prophylaxis: -He will continue allopurinol 300 mg daily.  4.  Infection prophylaxis: -He will continue Bactrim 3 times a week.  He cannot tolerate acyclovir secondary to headaches.  5.  Arthralgias: -Since we increased her venetoclax to 400 mg daily, he reports 8 out of 10 pain in the knees, shoulders and elbows.  We have tried to  get him on long-acting Naprosyn.  His insurance would not cover it. -We will send a prescription for Mobic 7.5 mg once daily.  I have also given a prescription for hydrocodone 5/325 mg to be taken as needed as a backup to Mobic.

## 2019-08-27 ENCOUNTER — Other Ambulatory Visit (HOSPITAL_COMMUNITY): Payer: Self-pay | Admitting: Pharmacist

## 2019-08-27 ENCOUNTER — Telehealth (HOSPITAL_COMMUNITY): Payer: Self-pay | Admitting: Pharmacy Technician

## 2019-08-27 DIAGNOSIS — C8300 Small cell B-cell lymphoma, unspecified site: Secondary | ICD-10-CM

## 2019-08-27 MED ORDER — VENETOCLAX 100 MG PO TABS: 400.0000 mg | ORAL_TABLET | Freq: Every day | ORAL | 0 refills | Status: DC

## 2019-08-27 NOTE — Telephone Encounter (Addendum)
Oral Oncology Patient Advocate Encounter  Received an email from [place] that [employer] is requiring the patient to enroll in an employer copay savings program for his [place].  Program is called [employer].  I did call and speak with the patient and he is going to call [employer] to enroll.     [place] Patient [place] Phone [phone] Fax [phone] 08/27/2019 10:27 AM

## 2019-08-31 NOTE — Telephone Encounter (Addendum)
GentryKara Dies [id number] ID [id number]  Patient will have to pay $5.00 after copay card.  [place] Patient [place] Phone [phone] Fax [phone] 09/03/2019 10:19 AM

## 2019-08-31 NOTE — Telephone Encounter (Signed)
Oral Oncology Patient Advocate Encounter  Performed a test claim to get reject information again.  When I processed the claim it went through insurance with a $2165 copay.  I called and left patient a voicemail to see if the [facility] program had provided him with billing information for the pharmacy.  [employer] is currently open for assistance if they did not provide any pharmacy billing information.  [place] Patient [place] Phone [phone] Fax [phone] 08/31/2019 3:58 PM

## 2019-09-01 ENCOUNTER — Inpatient Hospital Stay (HOSPITAL_COMMUNITY): Payer: 59 | Attending: Hematology | Admitting: Hematology

## 2019-09-01 ENCOUNTER — Inpatient Hospital Stay (HOSPITAL_COMMUNITY): Payer: 59

## 2019-09-01 ENCOUNTER — Other Ambulatory Visit: Payer: Self-pay

## 2019-09-01 ENCOUNTER — Encounter (HOSPITAL_COMMUNITY): Payer: Self-pay | Admitting: Hematology

## 2019-09-01 VITALS — BP 114/74 | HR 66 | Temp 97.1°F | Resp 18

## 2019-09-01 VITALS — BP 118/73 | HR 66 | Temp 97.5°F | Resp 18 | Wt 196.0 lb

## 2019-09-01 DIAGNOSIS — C8300 Small cell B-cell lymphoma, unspecified site: Secondary | ICD-10-CM

## 2019-09-01 DIAGNOSIS — I252 Old myocardial infarction: Secondary | ICD-10-CM | POA: Insufficient documentation

## 2019-09-01 DIAGNOSIS — I2510 Atherosclerotic heart disease of native coronary artery without angina pectoris: Secondary | ICD-10-CM | POA: Insufficient documentation

## 2019-09-01 DIAGNOSIS — Z791 Long term (current) use of non-steroidal anti-inflammatories (NSAID): Secondary | ICD-10-CM | POA: Insufficient documentation

## 2019-09-01 DIAGNOSIS — Z8051 Family history of malignant neoplasm of kidney: Secondary | ICD-10-CM | POA: Insufficient documentation

## 2019-09-01 DIAGNOSIS — E7800 Pure hypercholesterolemia, unspecified: Secondary | ICD-10-CM | POA: Insufficient documentation

## 2019-09-01 DIAGNOSIS — Z7982 Long term (current) use of aspirin: Secondary | ICD-10-CM | POA: Diagnosis not present

## 2019-09-01 DIAGNOSIS — Z8249 Family history of ischemic heart disease and other diseases of the circulatory system: Secondary | ICD-10-CM | POA: Insufficient documentation

## 2019-09-01 DIAGNOSIS — Z823 Family history of stroke: Secondary | ICD-10-CM | POA: Insufficient documentation

## 2019-09-01 DIAGNOSIS — I10 Essential (primary) hypertension: Secondary | ICD-10-CM | POA: Insufficient documentation

## 2019-09-01 DIAGNOSIS — C8304 Small cell B-cell lymphoma, lymph nodes of axilla and upper limb: Secondary | ICD-10-CM | POA: Insufficient documentation

## 2019-09-01 DIAGNOSIS — Z79899 Other long term (current) drug therapy: Secondary | ICD-10-CM | POA: Diagnosis not present

## 2019-09-01 DIAGNOSIS — M7918 Myalgia, other site: Secondary | ICD-10-CM | POA: Diagnosis not present

## 2019-09-01 DIAGNOSIS — F17210 Nicotine dependence, cigarettes, uncomplicated: Secondary | ICD-10-CM | POA: Diagnosis not present

## 2019-09-01 DIAGNOSIS — Z5112 Encounter for antineoplastic immunotherapy: Secondary | ICD-10-CM | POA: Insufficient documentation

## 2019-09-01 DIAGNOSIS — Z833 Family history of diabetes mellitus: Secondary | ICD-10-CM | POA: Insufficient documentation

## 2019-09-01 LAB — MAGNESIUM: Magnesium: 1.8 mg/dL (ref 1.7–2.4)

## 2019-09-01 LAB — CBC WITH DIFFERENTIAL/PLATELET
Abs Immature Granulocytes: 0.01 10*3/uL (ref 0.00–0.07)
Basophils Absolute: 0 10*3/uL (ref 0.0–0.1)
Basophils Relative: 0 %
Eosinophils Absolute: 0 10*3/uL (ref 0.0–0.5)
Eosinophils Relative: 0 %
HCT: 39.1 % (ref 39.0–52.0)
Hemoglobin: 12.7 g/dL — ABNORMAL LOW (ref 13.0–17.0)
Immature Granulocytes: 0 %
Lymphocytes Relative: 26 %
Lymphs Abs: 1.7 10*3/uL (ref 0.7–4.0)
MCH: 29.1 pg (ref 26.0–34.0)
MCHC: 32.5 g/dL (ref 30.0–36.0)
MCV: 89.7 fL (ref 80.0–100.0)
Monocytes Absolute: 0.6 10*3/uL (ref 0.1–1.0)
Monocytes Relative: 10 %
Neutro Abs: 4.3 10*3/uL (ref 1.7–7.7)
Neutrophils Relative %: 64 %
Platelets: 230 10*3/uL (ref 150–400)
RBC: 4.36 MIL/uL (ref 4.22–5.81)
RDW: 13.8 % (ref 11.5–15.5)
WBC: 6.7 10*3/uL (ref 4.0–10.5)
nRBC: 0 % (ref 0.0–0.2)

## 2019-09-01 LAB — COMPREHENSIVE METABOLIC PANEL
ALT: 15 U/L (ref 0–44)
AST: 18 U/L (ref 15–41)
Albumin: 3.8 g/dL (ref 3.5–5.0)
Alkaline Phosphatase: 76 U/L (ref 38–126)
Anion gap: 8 (ref 5–15)
BUN: 16 mg/dL (ref 6–20)
CO2: 24 mmol/L (ref 22–32)
Calcium: 8.7 mg/dL — ABNORMAL LOW (ref 8.9–10.3)
Chloride: 106 mmol/L (ref 98–111)
Creatinine, Ser: 0.85 mg/dL (ref 0.61–1.24)
GFR calc Af Amer: >60 mL/min (ref >60)
GFR calc non Af Amer: >60 mL/min (ref >60)
Glucose, Bld: 81 mg/dL (ref 70–99)
Potassium: 3.9 mmol/L (ref 3.5–5.1)
Sodium: 138 mmol/L (ref 135–145)
Total Bilirubin: 0.7 mg/dL (ref 0.3–1.2)
Total Protein: 6.5 g/dL (ref 6.5–8.1)

## 2019-09-01 LAB — URIC ACID: Uric Acid, Serum: 4.1 mg/dL (ref 3.7–8.6)

## 2019-09-01 LAB — PHOSPHORUS: Phosphorus: 3.4 mg/dL (ref 2.5–4.6)

## 2019-09-01 MED ORDER — SODIUM CHLORIDE 0.9 % IV SOLN: Freq: Once | INTRAVENOUS | Status: AC

## 2019-09-01 MED ORDER — SODIUM CHLORIDE 0.9 % IV SOLN
1000.0000 mg | Freq: Once | INTRAVENOUS | Status: AC
  Administered 2019-09-01: 1000 mg via INTRAVENOUS
  Filled 2019-09-01: qty 40

## 2019-09-01 MED ORDER — ACETAMINOPHEN 325 MG PO TABS
650.0000 mg | ORAL_TABLET | Freq: Once | ORAL | Status: AC
  Administered 2019-09-01: 650 mg via ORAL
  Filled 2019-09-01: qty 2

## 2019-09-01 MED ORDER — FAMOTIDINE IN NACL 20-0.9 MG/50ML-% IV SOLN
20.0000 mg | Freq: Once | INTRAVENOUS | Status: AC
  Administered 2019-09-01: 09:00:00 20 mg via INTRAVENOUS
  Filled 2019-09-01: qty 50

## 2019-09-01 MED ORDER — SODIUM CHLORIDE 0.9 % IV SOLN
20.0000 mg | Freq: Once | INTRAVENOUS | Status: AC
  Administered 2019-09-01: 10:00:00 20 mg via INTRAVENOUS
  Filled 2019-09-01: qty 20

## 2019-09-01 MED ORDER — DIPHENHYDRAMINE HCL 50 MG/ML IJ SOLN
50.0000 mg | Freq: Once | INTRAMUSCULAR | Status: AC
  Administered 2019-09-01: 50 mg via INTRAVENOUS
  Filled 2019-09-01: qty 1

## 2019-09-01 MED FILL — VENCLEXTA 100 MG TABS: 100 | 30 days supply | Qty: 120 | Fill #1

## 2019-09-01 NOTE — Progress Notes (Signed)
[address] [place], [place] [address]   CLINIC:  Medical Oncology/Hematology  PCP:  Orpah Greek, MD [address], [address] [place] [address] [phone]   REASON FOR VISIT:  Small lymphocytic lymphoma.  CURRENT THERAPY: Venetoclax and Gazyva.  INTERVAL HISTORY:  Mr. Zweber 59 y.o. male seen for follow-up of small lymphocytic lymphoma and toxicity assessment.  His joint pains have been tolerable since he started Mobic 7.5 mg daily.  However he reported crampy abdominal pain in the last 2 weeks.  Numbness in the hands on and off is stable.  Appetite and energy levels are 50%.  Denies any nausea or vomiting.  Denies any diarrhea.  No fevers or chills reported.   REVIEW OF SYSTEMS:  Review of Systems  Musculoskeletal: Positive for arthralgias.  All other systems reviewed and are negative.    PAST MEDICAL/SURGICAL HISTORY:  Past Medical History:  Diagnosis Date  . CAD (coronary artery disease)   . GERD (gastroesophageal reflux disease)   . HTN (hypertension)   . Hypercholesterolemia   . MI (myocardial infarction) ([place]) 2003   Past Surgical History:  Procedure Laterality Date  . AXILLARY LYMPH NODE BIOPSY Left 04/27/2019   Procedure: AXILLARY LYMPH NODE BIOPSY;  Surgeon: Aviva Signs, MD;  Location: [facility]S;  Service: General;  Laterality: Left;  . BIOPSY  11/26/2018   Procedure: BIOPSY;  Surgeon: Daneil Dolin, MD;  Location: [facility] SUITE;  Service: Endoscopy;;  . CHOLECYSTECTOMY N/A 05/18/2019   Procedure: LAPAROSCOPIC CHOLECYSTECTOMY;  Surgeon: Aviva Signs, MD;  Location: [facility];  Service: General;  Laterality: N/A;  . CORONARY ANGIOPLASTY WITH STENT PLACEMENT     2003  . ESOPHAGOGASTRODUODENOSCOPY N/A 11/26/2018   mild erosive reflux esophagitis, gastric erythema. Negative H.pylori   . INGUINAL HERNIA REPAIR Right 04/27/2019   Procedure: HERNIA REPAIR INGUINAL ADULT;  Surgeon: Aviva Signs, MD;  Location: [facility];  Service:  General;  Laterality: Right;  . KNEE SURGERY Right   . lumbar fusion with cage  07/2017  . NASAL SINUS SURGERY    . TONSILLECTOMY AND ADENOIDECTOMY       SOCIAL HISTORY:  Social History   Socioeconomic History  . Marital status: Divorced    Spouse name: Not on file  . Number of children: 2  . Years of education: Not on file  . Highest education level: Not on file  Occupational History  . 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 Smoking status: Current Every Day Smoker    Packs/day: 0.50    Years: 25.00    Pack years: 12.50    Types: Cigarettes  . Smokeless tobacco: Never Used  Substance and Sexual Activity  . Alcohol use: Yes    Comment: occ  . Drug use: Never  . Sexual activity: Yes  Other Topics Concern  . Not on file  Social History Narrative  . Not on file   Social Determinants of Health   Financial Resource Strain: Low Risk   . Difficulty of Paying Living Expenses: Not hard at all  Food Insecurity: No Food Insecurity  . Worried About Charity fundraiser in the Last Year: Never true  . Ran Out of Food in the Last Year: Never true  Transportation Needs: No Transportation Needs  . Lack of Transportation (Medical): No  . Lack of Transportation (Non-Medical): No  Physical Activity: Sufficiently Active  . Days of Exercise per Week: 4 days  .  Minutes of Exercise per Session: 60 min  Stress: No Stress Concern Present  . Feeling of Stress : Not at all  Social Connections: Slightly Isolated  . Frequency of Communication with Friends and Family: More than three times a week  . Frequency of Social Gatherings with Friends and Family: More than three times a week  . Attends Religious Services: More than 4 times per year  . Active Member of Clubs or Organizations: Yes  . Attends Archivist Meetings: 1 to 4 times per year  . Marital Status: Divorced  Human resources officer Violence: Not At Risk  . Fear of Current or Ex-Partner: No   . Emotionally Abused: No  . Physically Abused: No  . Sexually Abused: No    FAMILY HISTORY:  Family History  Problem Relation Age of Onset  . Colon polyps Father        thinks he may have had polyps, possibly in his 30s.   [name] Heart attack Father   . Dementia Mother   . Hypotension Mother   . Stroke Sister   . Diabetes Maternal Grandmother   . Stroke Maternal Grandfather   . Cancer Paternal Grandmother   . Congestive Heart Failure Paternal Grandfather   . Kidney cancer Daughter   . Colon cancer Neg Hx   . Pancreatic cancer Neg Hx   . Liver disease Neg Hx     CURRENT MEDICATIONS:  Outpatient Encounter Medications as of 09/01/2019  Medication Sig  . acyclovir (ZOVIRAX) 400 MG tablet Take 1 tablet (400 mg total) by mouth 2 (two) times daily.  [name] allopurinol (ZYLOPRIM) 300 MG tablet TAKE 1 TABLET BY MOUTH EVERY DAY  . aspirin EC 81 MG tablet Take 81 mg by mouth 3 (three) times a week. Pt takes M,W,F  . clopidogrel (PLAVIX) 75 MG tablet Take 75 mg by mouth daily.   [name] escitalopram (LEXAPRO) 20 MG tablet Take 20 mg by mouth daily.   [name] lisinopril (PRINIVIL,ZESTRIL) 10 MG tablet Take 10 mg by mouth daily.   . meloxicam (MOBIC) 7.5 MG tablet Take 1 tablet (7.5 mg total) by mouth daily.  [name] obinutuzumab in sodium chloride 0.9 % 250 mL Inject into the vein.  . simvastatin (ZOCOR) 40 MG tablet Take 40 mg by mouth every other day.   . sulfamethoxazole-trimethoprim (BACTRIM DS) 800-160 MG tablet Take 1 tablet by mouth 3 (three) times a week.  . venetoclax 100 MG TABS Take 400 mg by mouth daily in the afternoon.  . baclofen (LIORESAL) 10 MG tablet Take 10 mg by mouth 2 (two) times daily as needed for muscle spasms.   . cimetidine (TAGAMET) 200 MG tablet Take 200 mg by mouth as needed.   . gabapentin (NEURONTIN) 100 MG capsule Take 100 mg by mouth 2 (two) times daily as needed (pain).   . naproxen sodium (ALEVE) 220 MG tablet Take 220-440 mg by mouth 2 (two) times daily as needed (pain).   .  pantoprazole (PROTONIX) 40 MG tablet Take 40 mg by mouth as needed.   . [DISCONTINUED] HYDROcodone-acetaminophen (NORCO/VICODIN) 5-325 MG tablet Take 1 tablet by mouth every 8 (eight) hours as needed for moderate pain. (Patient not taking: Reported on 09/01/2019)  . [DISCONTINUED] oxyCODONE-acetaminophen (PERCOCET) 7.5-325 MG tablet Take 1 tablet by mouth every 6 (six) hours as needed. (Patient not taking: Reported on 06/09/2019)   No facility-administered encounter medications on file as of 09/01/2019.    ALLERGIES:  No Known Allergies   PHYSICAL EXAM:  ECOG Performance  status: 1  Vitals:   09/01/19 0815  BP: 118/73  Pulse: 66  Resp: 18  Temp: (!) 97.5 F (36.4 C)  SpO2: 100%   Filed Weights   09/01/19 0815  Weight: 196 lb (88.9 kg)    Physical Exam Vitals reviewed.  Constitutional:      Appearance: Normal appearance.  Cardiovascular:     Rate and Rhythm: Normal rate and regular rhythm.     Heart sounds: Normal heart sounds.  Pulmonary:     Effort: Pulmonary effort is normal.     Breath sounds: Normal breath sounds.  Abdominal:     General: There is no distension.     Palpations: Abdomen is soft. There is no mass.  Musculoskeletal:        General: No swelling.  Skin:    General: Skin is warm.  Neurological:     General: No focal deficit present.     Mental Status: He is alert and oriented to person, place, and time.  Psychiatric:        Mood and Affect: Mood normal.        Behavior: Behavior normal.      LABORATORY DATA:  I have reviewed the labs as listed.  CBC    Component Value Date/Time   WBC 6.7 09/01/2019 0805   RBC 4.36 09/01/2019 0805   HGB 12.7 (L) 09/01/2019 0805   HCT 39.1 09/01/2019 0805   PLT 230 09/01/2019 0805   MCV 89.7 09/01/2019 0805   MCH 29.1 09/01/2019 0805   MCHC 32.5 09/01/2019 0805   RDW 13.8 09/01/2019 0805   LYMPHSABS 1.7 09/01/2019 0805   MONOABS 0.6 09/01/2019 0805   EOSABS 0.0 09/01/2019 0805   BASOSABS 0.0 09/01/2019  0805   CMP Latest Ref Rng & Units 09/01/2019 08/20/2019 08/04/2019  Glucose 70 - 99 mg/dL 81 88 80  BUN 6 - 20 mg/dL '16 15 19  '$ Creatinine 0.61 - 1.24 mg/dL 0.85 0.79 0.81  Sodium 135 - 145 mmol/L 138 138 137  Potassium 3.5 - 5.1 mmol/L 3.9 3.9 4.1  Chloride 98 - 111 mmol/L 106 105 105  CO2 22 - 32 mmol/L '24 24 24  '$ Calcium 8.9 - 10.3 mg/dL 8.7(L) 8.9 8.9  Total Protein 6.5 - 8.1 g/dL 6.5 6.8 6.5  Total Bilirubin 0.3 - 1.2 mg/dL 0.7 0.9 0.8  Alkaline Phos 38 - 126 U/L 76 81 64  AST 15 - 41 U/L '18 15 17  '$ ALT 0 - 44 U/L '15 16 18       '$ DIAGNOSTIC IMAGING:  I have independently reviewed the scans and discussed with the patient.    ASSESSMENT & PLAN:   Small lymphocytic lymphoma ([place]) 1.  Clinical stage IIIb small lymphocytic lymphoma: -Presentation with 56 pound weight loss in the last 1 and half year.  No fevers or night sweats. -Left axillary lymph node biopsy on 04/27/2019 consistent with small lymphocytic lymphoma.  T p53 mutation was not detected.  I GVH mutation could not be reported. -Obinutuzumab cycle 1 on 06/09/2019, cycle 2 of obinutuzumab with venetoclax on 07/07/2019. -Venetoclax increased to 400 mg daily on 08/04/2019. -His weight has been stable.  I have reviewed his CBC and comprehensive metabolic panel.  He will proceed with cycle 4 of obinutuzumab today. -He will continue venetoclax 400 mg daily. -We will see him back in 2 weeks for follow-up with labs.  2.  Musculoskeletal pains: -He has arthralgias in the knees, elbows and shoulders since venetoclax was started, which  has worsened after dose increased to 400 mg daily. -I have prescribed him Mobic 7.5 mg at last visit.  He started taking it and feels that the pains have improved to tolerable levels.  He did not refill hydrocodone yet. -However he has noticed occasional crampy abdominal pain lasting 45 minutes in the last 2 weeks. -I have recommended him to use Maalox as needed.  This could be from gastritis from Mobic.   I have told him to fill hydrocodone and use it as needed.  3.  TLS prophylaxis: -Today uric acid was 4.1.  He will continue allopurinol 300 mg daily.  4.  Infection prophylaxis: -He will continue Bactrim 3 times a week.  He could not tolerate acyclovir secondary to headaches.  5.  Weight loss: -He gained 3 pounds in the last 2 weeks.     Orders placed this encounter:  Orders Placed This Encounter  Procedures  . CBC with Differential/Platelet  . Comprehensive metabolic panel  . Magnesium  . Phosphorus  . Uric acid  . Lactate dehydrogenase      Derek Jack, MD [place] [phone]

## 2019-09-01 NOTE — Patient Instructions (Signed)
[facility] Discharge Instructions for Patients Receiving Chemotherapy  Today you received the following chemotherapy agents   To help prevent nausea and vomiting after your treatment, we encourage you to take your nausea medication   If you develop nausea and vomiting that is not controlled by your nausea medication, call the clinic.   BELOW ARE SYMPTOMS THAT SHOULD BE REPORTED IMMEDIATELY:  *FEVER GREATER THAN 100.5 F  *CHILLS WITH OR WITHOUT FEVER  NAUSEA AND VOMITING THAT IS NOT CONTROLLED WITH YOUR NAUSEA MEDICATION  *UNUSUAL SHORTNESS OF BREATH  *UNUSUAL BRUISING OR BLEEDING  TENDERNESS IN MOUTH AND THROAT WITH OR WITHOUT PRESENCE OF ULCERS  *URINARY PROBLEMS  *BOWEL PROBLEMS  UNUSUAL RASH Items with * indicate a potential emergency and should be followed up as soon as possible.  Feel free to call the clinic should you have any questions or concerns. The clinic phone number is [phone].  Please show the CHEMO ALERT CARD at check-in to the Emergency Department and triage nurse.   

## 2019-09-01 NOTE — Progress Notes (Signed)
Labs reviewed with Dr. Delton Coombes today. Proceed with treatment today per MD.   Treatment given per orders. Patient tolerated it well without problems. Vitals stable and discharged home from clinic ambulatory. Follow up as scheduled.

## 2019-09-01 NOTE — Patient Instructions (Addendum)
[place] at [facility] Discharge Instructions  You were seen today by Dr. Delton Coombes. He went over your recent lab results. Try taking maalox or mylanta to help with the stomach discomfort. He will see you back in 2 weeks for labs and follow up.   Thank you for choosing [place] at [facility] to provide your oncology and hematology care.  To afford each patient quality time with our provider, please arrive at least 15 minutes before your scheduled appointment time.   If you have a lab appointment with the [facility] please come in thru the  Main Entrance and check in at the main information desk  You need to re-schedule your appointment should you arrive 10 or more minutes late.  We strive to give you quality time with our providers, and arriving late affects you and other patients whose appointments are after yours.  Also, if you no show three or more times for appointments you may be dismissed from the clinic at the providers discretion.     Again, thank you for choosing [facility].  Our hope is that these requests will decrease the amount of time that you wait before being seen by our physicians.       _____________________________________________________________  Should you have questions after your visit to [facility], please contact our office at [phone] between the hours of 8:00 a.m. and 4:30 p.m.  Voicemails left after 4:00 p.m. will not be returned until the following business day.  For prescription refill requests, have your pharmacy contact our office and allow 72 hours.    Cancer Center Support Programs:   > Cancer Support Group  2nd Tuesday of the month 1pm-2pm, Journey Room

## 2019-09-01 NOTE — Progress Notes (Signed)
Patient has been assessed, vital signs and labs have been reviewed by Dr. Katragadda. ANC, Creatinine, LFTs, and Platelets are within treatment parameters per Dr. Katragadda. The patient is good to proceed with treatment at this time.  

## 2019-09-04 NOTE — Telephone Encounter (Signed)
Patient called the nurse line to inform us that he had treatment on 09/01/2019 and woke up with jaw pain and chest discomfort Tuesday night.  He had no relief with Tums.  He took two Aspirin 81 mg and Nitroglycerin about 15 minutes after the Tums and pain subsided.  He contacted cardiologist the next morning.  Cardiologist reviewed his recent ECHO and stated everything looked ok, but ordered a stress test on patient.  I spoke with Francene Finders, NP and patient was advised to ER if CP occurred again, but otherwise to wait for stress test results per cardiologist.

## 2019-09-05 ENCOUNTER — Encounter (HOSPITAL_COMMUNITY): Payer: Self-pay | Admitting: Hematology

## 2019-09-05 NOTE — Assessment & Plan Note (Signed)
1.  Clinical stage IIIb small lymphocytic lymphoma: -Presentation with 56 pound weight loss in the last 1 and half year.  No fevers or night sweats. -Left axillary lymph node biopsy on 04/27/2019 consistent with small lymphocytic lymphoma.  T p53 mutation was not detected.  I GVH mutation could not be reported. -Obinutuzumab cycle 1 on 06/09/2019, cycle 2 of obinutuzumab with venetoclax on 07/07/2019. -Venetoclax increased to 400 mg daily on 08/04/2019. -His weight has been stable.  I have reviewed his CBC and comprehensive metabolic panel.  He will proceed with cycle 4 of obinutuzumab today. -He will continue venetoclax 400 mg daily. -We will see him back in 2 weeks for follow-up with labs.  2.  Musculoskeletal pains: -He has arthralgias in the knees, elbows and shoulders since venetoclax was started, which has worsened after dose increased to 400 mg daily. -I have prescribed him Mobic 7.5 mg at last visit.  He started taking it and feels that the pains have improved to tolerable levels.  He did not refill hydrocodone yet. -However he has noticed occasional crampy abdominal pain lasting 45 minutes in the last 2 weeks. -I have recommended him to use Maalox as needed.  This could be from gastritis from Mobic.  I have told him to fill hydrocodone and use it as needed.  3.  TLS prophylaxis: -Today uric acid was 4.1.  He will continue allopurinol 300 mg daily.  4.  Infection prophylaxis: -He will continue Bactrim 3 times a week.  He could not tolerate acyclovir secondary to headaches.  5.  Weight loss: -He gained 3 pounds in the last 2 weeks.

## 2019-09-15 ENCOUNTER — Other Ambulatory Visit: Payer: Self-pay

## 2019-09-15 ENCOUNTER — Encounter (HOSPITAL_COMMUNITY): Payer: Self-pay | Admitting: Hematology

## 2019-09-15 ENCOUNTER — Inpatient Hospital Stay (HOSPITAL_COMMUNITY): Payer: 59

## 2019-09-15 ENCOUNTER — Inpatient Hospital Stay (HOSPITAL_COMMUNITY): Payer: 59 | Admitting: Hematology

## 2019-09-15 VITALS — BP 105/64 | HR 69 | Temp 97.1°F | Resp 17 | Wt 195.9 lb

## 2019-09-15 DIAGNOSIS — C8300 Small cell B-cell lymphoma, unspecified site: Secondary | ICD-10-CM | POA: Diagnosis not present

## 2019-09-15 DIAGNOSIS — Z5112 Encounter for antineoplastic immunotherapy: Secondary | ICD-10-CM | POA: Diagnosis not present

## 2019-09-15 LAB — COMPREHENSIVE METABOLIC PANEL
ALT: 17 U/L (ref 0–44)
AST: 18 U/L (ref 15–41)
Albumin: 3.8 g/dL (ref 3.5–5.0)
Alkaline Phosphatase: 83 U/L (ref 38–126)
Anion gap: 8 (ref 5–15)
BUN: 13 mg/dL (ref 6–20)
CO2: 26 mmol/L (ref 22–32)
Calcium: 9 mg/dL (ref 8.9–10.3)
Chloride: 104 mmol/L (ref 98–111)
Creatinine, Ser: 0.81 mg/dL (ref 0.61–1.24)
GFR calc Af Amer: >60 mL/min (ref >60)
GFR calc non Af Amer: >60 mL/min (ref >60)
Glucose, Bld: 92 mg/dL (ref 70–99)
Potassium: 4 mmol/L (ref 3.5–5.1)
Sodium: 138 mmol/L (ref 135–145)
Total Bilirubin: 1 mg/dL (ref 0.3–1.2)
Total Protein: 6.9 g/dL (ref 6.5–8.1)

## 2019-09-15 LAB — CBC WITH DIFFERENTIAL/PLATELET
Abs Immature Granulocytes: 0.02 10*3/uL (ref 0.00–0.07)
Basophils Absolute: 0.1 10*3/uL (ref 0.0–0.1)
Basophils Relative: 1 %
Eosinophils Absolute: 0.1 10*3/uL (ref 0.0–0.5)
Eosinophils Relative: 2 %
HCT: 39.1 % (ref 39.0–52.0)
Hemoglobin: 12.7 g/dL — ABNORMAL LOW (ref 13.0–17.0)
Immature Granulocytes: 0 %
Lymphocytes Relative: 23 %
Lymphs Abs: 1.6 10*3/uL (ref 0.7–4.0)
MCH: 29.1 pg (ref 26.0–34.0)
MCHC: 32.5 g/dL (ref 30.0–36.0)
MCV: 89.5 fL (ref 80.0–100.0)
Monocytes Absolute: 0.9 10*3/uL (ref 0.1–1.0)
Monocytes Relative: 13 %
Neutro Abs: 4 10*3/uL (ref 1.7–7.7)
Neutrophils Relative %: 61 %
Platelets: 230 10*3/uL (ref 150–400)
RBC: 4.37 MIL/uL (ref 4.22–5.81)
RDW: 14.1 % (ref 11.5–15.5)
WBC: 6.7 10*3/uL (ref 4.0–10.5)
nRBC: 0 % (ref 0.0–0.2)

## 2019-09-15 LAB — PHOSPHORUS: Phosphorus: 3.3 mg/dL (ref 2.5–4.6)

## 2019-09-15 LAB — MAGNESIUM: Magnesium: 1.9 mg/dL (ref 1.7–2.4)

## 2019-09-15 LAB — URIC ACID: Uric Acid, Serum: 3.8 mg/dL (ref 3.7–8.6)

## 2019-09-15 LAB — LACTATE DEHYDROGENASE: LDH: 142 U/L (ref 98–192)

## 2019-09-15 NOTE — Assessment & Plan Note (Signed)
1.  Clinical stage IIIb small lymphocytic lymphoma: -Presentation with 56 pound weight loss in the last 1 and half year.  No fevers or night sweats. -Left axillary lymph node biopsy on 04/27/2019 consistent with small lymphocytic lymphoma.  T p53 mutation was not detected.  I GVH mutation could not be reported. -4 cycles of obinutuzumab from 06/09/2019 through 09/01/2019. -Venetoclax increased to 400 mg on 08/04/2019. -We reviewed his labs.  White count is 6.7 with hemoglobin 12.7.  Differential was normal.  Platelets are 230. -Patient had an episode of neck pain, hands tingling and discomfort in the chest on the same night of cycle 4.  He took Tums which did not help.  He took nitro and 2 aspirin which helped with the pain.  He had stress test done with Dr. Sabra Heck last Thursday. -He is seeing Dr. Sabra Heck later this week.  We will obtain results of the tests.  He no longer has chest pain.  I will see him back in 2 weeks prior to cycle 5.  2.  Musculoskeletal pains: -He has joint pains in the knees, elbows and shoulders since venetoclax was started. -Mobic 7.5 mg daily is helping.  He tried hydrocodone which did not help.  3.  TLS prophylaxis: -Uric acid is normal.  Continue allopurinol 300 mg daily.  4.  Infection prophylaxis: -He will continue Bactrim 3 times a week.  5.  Weight loss: -His weight is stable.

## 2019-09-15 NOTE — Progress Notes (Signed)
Patrick Rubio, [place] [address]   CLINIC:  Medical Oncology/Hematology  PCP:  Orpah Greek, MD [address], [address] [place] [address] [phone]   REASON FOR VISIT:  Small lymphocytic lymphoma.  CURRENT THERAPY: Venetoclax and Gazyva.  INTERVAL HISTORY:  Patrick Rubio 59 y.o. male seen for follow-up of small lymphocytic lymphoma and toxicity assessment.  Taking venetoclax 400 mg daily.  Takes Mobic 7.5 mg daily.  Appetite and energy levels are 50%.  Weight has been stable.  Reported chest pain during the night of treatment 2 weeks ago.  He had to take nitro and 2 baby aspirin to relieve the pain.  He was seen by Dr. Sabra Heck and a stress test was done.  He does not know the results yet.  REVIEW OF SYSTEMS:  Review of Systems  Gastrointestinal: Positive for diarrhea.  Musculoskeletal: Positive for arthralgias.  All other systems reviewed and are negative.    PAST MEDICAL/SURGICAL HISTORY:  Past Medical History:  Diagnosis Date  . CAD (coronary artery disease)   . GERD (gastroesophageal reflux disease)   . HTN (hypertension)   . Hypercholesterolemia   . MI (myocardial infarction) ([place]) 2003   Past Surgical History:  Procedure Laterality Date  . AXILLARY LYMPH NODE BIOPSY Left 04/27/2019   Procedure: AXILLARY LYMPH NODE BIOPSY;  Surgeon: Aviva Signs, MD;  Location: [facility] ORS;  Service: General;  Laterality: Left;  . BIOPSY  11/26/2018   Procedure: BIOPSY;  Surgeon: Daneil Dolin, MD;  Location: [facility];  Service: Endoscopy;;  . CHOLECYSTECTOMY N/A 05/18/2019   Procedure: LAPAROSCOPIC CHOLECYSTECTOMY;  Surgeon: Aviva Signs, MD;  Location: [facility];  Service: General;  Laterality: N/A;  . CORONARY ANGIOPLASTY WITH STENT PLACEMENT     2003  . ESOPHAGOGASTRODUODENOSCOPY N/A 11/26/2018   mild erosive reflux esophagitis, gastric erythema. Negative H.pylori   . INGUINAL HERNIA REPAIR Right 04/27/2019   Procedure: HERNIA REPAIR  INGUINAL ADULT;  Surgeon: Aviva Signs, MD;  Location: [facility];  Service: General;  Laterality: Right;  . KNEE SURGERY Right   . lumbar fusion with cage  07/2017  . NASAL SINUS SURGERY    . TONSILLECTOMY AND ADENOIDECTOMY       SOCIAL HISTORY:  Social History   Socioeconomic History  . Marital status: Divorced    Spouse name: Not on file  . Number of children: 2  . Years of education: Not on file  . Highest education level: Not on file  Occupational History  . 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 Smoking status: Current Every Day Smoker    Packs/day: 0.50    Years: 25.00    Pack years: 12.50    Types: Cigarettes  . Smokeless tobacco: Never Used  Substance and Sexual Activity  . Alcohol use: Yes    Comment: occ  . Drug use: Never  . Sexual activity: Yes  Other Topics Concern  . Not on file  Social History Narrative  . Not on file   Social Determinants of Health   Financial Resource Strain: Low Risk   . Difficulty of Paying Living Expenses: Not hard at all  Food Insecurity: No Food Insecurity  . Worried About Charity fundraiser in the Last Year: Never true  . Ran Out of Food in the Last Year: Never true  Transportation Needs: No Transportation Needs  . Lack of Transportation (Medical): No  . Lack of Transportation (  Non-Medical): No  Physical Activity: Sufficiently Active  . Days of Exercise per Week: 4 days  . Minutes of Exercise per Session: 60 min  Stress: No Stress Concern Present  . Feeling of Stress : Not at all  Social Connections: Slightly Isolated  . Frequency of Communication with Friends and Family: More than three times a week  . Frequency of Social Gatherings with Friends and Family: More than three times a week  . Attends Religious Services: More than 4 times per year  . Active Member of Clubs or Organizations: Yes  . Attends Archivist Meetings: 1 to 4 times per year  . Marital Status: Divorced    Human resources officer Violence: Not At Risk  . Fear of Current or Ex-Partner: No  . Emotionally Abused: No  . Physically Abused: No  . Sexually Abused: No    FAMILY HISTORY:  Family History  Problem Relation Age of Onset  . Colon polyps Father        thinks he may have had polyps, possibly in his 31s.   [name] Heart attack Father   . Dementia Mother   . Hypotension Mother   . Stroke Sister   . Diabetes Maternal Grandmother   . Stroke Maternal Grandfather   . Cancer Paternal Grandmother   . Congestive Heart Failure Paternal Grandfather   . Kidney cancer Daughter   . Colon cancer Neg Hx   . Pancreatic cancer Neg Hx   . Liver disease Neg Hx     CURRENT MEDICATIONS:  Outpatient Encounter Medications as of 09/15/2019  Medication Sig  . acyclovir (ZOVIRAX) 400 MG tablet Take 1 tablet (400 mg total) by mouth 2 (two) times daily.  [name] allopurinol (ZYLOPRIM) 300 MG tablet TAKE 1 TABLET BY MOUTH EVERY DAY  . aspirin EC 81 MG tablet Take 81 mg by mouth 3 (three) times a week. Pt takes M,W,F  . clopidogrel (PLAVIX) 75 MG tablet Take 75 mg by mouth daily.   [name] escitalopram (LEXAPRO) 20 MG tablet Take 20 mg by mouth daily.   [name] lisinopril (PRINIVIL,ZESTRIL) 10 MG tablet Take 10 mg by mouth daily.   . meloxicam (MOBIC) 7.5 MG tablet Take 1 tablet (7.5 mg total) by mouth daily.  [name] obinutuzumab in sodium chloride 0.9 % 250 mL Inject into the vein.  . pantoprazole (PROTONIX) 40 MG tablet Take 40 mg by mouth as needed.   . simvastatin (ZOCOR) 40 MG tablet Take 40 mg by mouth every other day.   . sulfamethoxazole-trimethoprim (BACTRIM DS) 800-160 MG tablet Take 1 tablet by mouth 3 (three) times a week.  . venetoclax 100 MG TABS Take 400 mg by mouth daily in the afternoon.  . baclofen (LIORESAL) 10 MG tablet Take 10 mg by mouth 2 (two) times daily as needed for muscle spasms.   . cimetidine (TAGAMET) 200 MG tablet Take 200 mg by mouth as needed.   . gabapentin (NEURONTIN) 100 MG capsule Take 100 mg by mouth  2 (two) times daily as needed (pain).   . naproxen sodium (ALEVE) 220 MG tablet Take 220-440 mg by mouth 2 (two) times daily as needed (pain).   . nitroGLYCERIN (NITROSTAT) 0.4 MG SL tablet SMARTSIG:1 Tablet(s) Sublingual As Needed   No facility-administered encounter medications on file as of 09/15/2019.    ALLERGIES:  No Known Allergies   PHYSICAL EXAM:  ECOG Performance status: 1  Vitals:   09/15/19 0937  BP: 105/64  Pulse: 69  Resp: 17  Temp: (!) 97.1  F (36.2 C)  SpO2: 99%   Filed Weights   09/15/19 0937  Weight: 195 lb 14.4 oz (88.9 kg)    Physical Exam Vitals reviewed.  Constitutional:      Appearance: Normal appearance.  Cardiovascular:     Rate and Rhythm: Normal rate and regular rhythm.     Heart sounds: Normal heart sounds.  Pulmonary:     Effort: Pulmonary effort is normal.     Breath sounds: Normal breath sounds.  Abdominal:     General: There is no distension.     Palpations: Abdomen is soft. There is no mass.  Musculoskeletal:        General: No swelling.  Skin:    General: Skin is warm.  Neurological:     General: No focal deficit present.     Mental Status: He is alert and oriented to person, place, and time.  Psychiatric:        Mood and Affect: Mood normal.        Behavior: Behavior normal.      LABORATORY DATA:  I have reviewed the labs as listed.  CBC    Component Value Date/Time   WBC 6.7 09/15/2019 0921   RBC 4.37 09/15/2019 0921   HGB 12.7 (L) 09/15/2019 0921   HCT 39.1 09/15/2019 0921   PLT 230 09/15/2019 0921   MCV 89.5 09/15/2019 0921   MCH 29.1 09/15/2019 0921   MCHC 32.5 09/15/2019 0921   RDW 14.1 09/15/2019 0921   LYMPHSABS 1.6 09/15/2019 0921   MONOABS 0.9 09/15/2019 0921   EOSABS 0.1 09/15/2019 0921   BASOSABS 0.1 09/15/2019 0921   CMP Latest Ref Rng & Units 09/15/2019 09/01/2019 08/20/2019  Glucose 70 - 99 mg/dL 92 81 88  BUN 6 - 20 mg/dL '13 16 15  '$ Creatinine 0.61 - 1.24 mg/dL 0.81 0.85 0.79  Sodium 135 - 145  mmol/L 138 138 138  Potassium 3.5 - 5.1 mmol/L 4.0 3.9 3.9  Chloride 98 - 111 mmol/L 104 106 105  CO2 22 - 32 mmol/L '26 24 24  '$ Calcium 8.9 - 10.3 mg/dL 9.0 8.7(L) 8.9  Total Protein 6.5 - 8.1 g/dL 6.9 6.5 6.8  Total Bilirubin 0.3 - 1.2 mg/dL 1.0 0.7 0.9  Alkaline Phos 38 - 126 U/L 83 76 81  AST 15 - 41 U/L '18 18 15  '$ ALT 0 - 44 U/L '17 15 16       '$ DIAGNOSTIC IMAGING:  I have independently reviewed the scans and discussed with the patient.    ASSESSMENT & PLAN:   Small lymphocytic lymphoma ([place]) 1.  Clinical stage IIIb small lymphocytic lymphoma: -Presentation with 56 pound weight loss in the last 1 and half year.  No fevers or night sweats. -Left axillary lymph node biopsy on 04/27/2019 consistent with small lymphocytic lymphoma.  T p53 mutation was not detected.  I GVH mutation could not be reported. -4 cycles of obinutuzumab from 06/09/2019 through 09/01/2019. -Venetoclax increased to 400 mg on 08/04/2019. -We reviewed his labs.  White count is 6.7 with hemoglobin 12.7.  Differential was normal.  Platelets are 230. -Patient had an episode of neck pain, hands tingling and discomfort in the chest on the same night of cycle 4.  He took Tums which did not help.  He took nitro and 2 aspirin which helped with the pain.  He had stress test done with Dr. Sabra Heck last Thursday. -He is seeing Dr. Sabra Heck later this week.  We will obtain results of the tests.  He no longer has chest pain.  I will see him back in 2 weeks prior to cycle 5.  2.  Musculoskeletal pains: -He has joint pains in the knees, elbows and shoulders since venetoclax was started. -Mobic 7.5 mg daily is helping.  He tried hydrocodone which did not help.  3.  TLS prophylaxis: -Uric acid is normal.  Continue allopurinol 300 mg daily.  4.  Infection prophylaxis: -He will continue Bactrim 3 times a week.  5.  Weight loss: -His weight is stable.     Orders placed this encounter:  Orders Placed This Encounter  Procedures   . Magnesium  . Phosphorus  . Uric acid  . CBC with Differential/Platelet  . Comprehensive metabolic panel      Derek Jack, MD [place] [phone]

## 2019-09-15 NOTE — Patient Instructions (Addendum)
[facility] at [facility] Discharge Instructions  You were seen today by Dr. Katragadda. He went over your recent lab results. He will see you back in 2 weeks for labs, treatment and follow up.   Thank you for choosing [facility] at [facility] to provide your oncology and hematology care.  To afford each patient quality time with our provider, please arrive at least 15 minutes before your scheduled appointment time.   If you have a lab appointment with the Cancer Center please come in thru the  Main Entrance and check in at the main information desk  You need to re-schedule your appointment should you arrive 10 or more minutes late.  We strive to give you quality time with our providers, and arriving late affects you and other patients whose appointments are after yours.  Also, if you no show three or more times for appointments you may be dismissed from the clinic at the providers discretion.     Again, thank you for choosing [facility].  Our hope is that these requests will decrease the amount of time that you wait before being seen by our physicians.       _____________________________________________________________  Should you have questions after your visit to [facility], please contact our office at [phone] between the hours of 8:00 a.m. and 4:30 p.m.  Voicemails left after 4:00 p.m. will not be returned until the following business day.  For prescription refill requests, have your pharmacy contact our office and allow 72 hours.    Cancer Center Support Programs:   > Cancer Support Group  2nd Tuesday of the month 1pm-2pm, Journey Room    

## 2019-09-23 ENCOUNTER — Other Ambulatory Visit (HOSPITAL_COMMUNITY): Payer: Self-pay | Admitting: Hematology

## 2019-09-23 ENCOUNTER — Telehealth (HOSPITAL_COMMUNITY): Payer: Self-pay | Admitting: Pharmacist

## 2019-09-23 ENCOUNTER — Telehealth (HOSPITAL_COMMUNITY): Payer: Self-pay | Admitting: *Deleted

## 2019-09-23 DIAGNOSIS — C8300 Small cell B-cell lymphoma, unspecified site: Secondary | ICD-10-CM

## 2019-09-23 NOTE — Telephone Encounter (Signed)
Phoned pt and advised per Dr. Delton Coombes that he needs to decrease his venetoclax dose to 200 mg daily vs 400 mg daily d/t being recently started on carvedilol by his cardiologist, as this medication may interact with his venetoclax.  Pt verbalizes understanding of the new, decreased dose and how to take it daily.

## 2019-09-23 NOTE — Telephone Encounter (Signed)
Oral Chemotherapy Pharmacist Encounter   I received a call from [facility] letting me know Patrick Rubio reported a taking a new medication that would interact with his venetoclax. I called him to confirm, he was recently started on carvedilol 12.5mg  twice daily by his cardiologist.   The carvedilol may increase the concentration of his venetoclax and the recommendation for managing this interact is to decrease the dose of venetoclax by 50%. Messaged Dr. Delton Coombes to inform him of the interaction.  Darl Pikes, PharmD, BCPS, [facility] Hematology/Oncology Clinical Pharmacist [facility] [phone]  09/23/2019 10:05 AM

## 2019-09-28 MED FILL — VENCLEXTA 100 MG TABS: 100 | 30 days supply | Qty: 60 | Fill #0

## 2019-09-29 ENCOUNTER — Other Ambulatory Visit: Payer: Self-pay

## 2019-09-29 ENCOUNTER — Inpatient Hospital Stay (HOSPITAL_COMMUNITY): Payer: 59

## 2019-09-29 ENCOUNTER — Encounter (HOSPITAL_COMMUNITY): Payer: Self-pay | Admitting: Hematology

## 2019-09-29 ENCOUNTER — Other Ambulatory Visit (HOSPITAL_COMMUNITY)
Admission: RE | Admit: 2019-09-29 | Discharge: 2019-09-29 | Disposition: A | Discharge status: Home / Self Care | Payer: 59 | Source: Ambulatory Visit | Attending: Internal Medicine | Admitting: Internal Medicine

## 2019-09-29 ENCOUNTER — Inpatient Hospital Stay (HOSPITAL_COMMUNITY): Payer: 59 | Attending: Hematology | Admitting: Hematology

## 2019-09-29 VITALS — BP 107/73 | HR 63 | Temp 97.7°F | Resp 18

## 2019-09-29 DIAGNOSIS — Z7982 Long term (current) use of aspirin: Secondary | ICD-10-CM | POA: Insufficient documentation

## 2019-09-29 DIAGNOSIS — I2510 Atherosclerotic heart disease of native coronary artery without angina pectoris: Secondary | ICD-10-CM | POA: Insufficient documentation

## 2019-09-29 DIAGNOSIS — M7918 Myalgia, other site: Secondary | ICD-10-CM | POA: Diagnosis not present

## 2019-09-29 DIAGNOSIS — R079 Chest pain, unspecified: Secondary | ICD-10-CM | POA: Insufficient documentation

## 2019-09-29 DIAGNOSIS — E7800 Pure hypercholesterolemia, unspecified: Secondary | ICD-10-CM | POA: Insufficient documentation

## 2019-09-29 DIAGNOSIS — C8300 Small cell B-cell lymphoma, unspecified site: Secondary | ICD-10-CM

## 2019-09-29 DIAGNOSIS — Z79899 Other long term (current) drug therapy: Secondary | ICD-10-CM | POA: Diagnosis not present

## 2019-09-29 DIAGNOSIS — I10 Essential (primary) hypertension: Secondary | ICD-10-CM | POA: Insufficient documentation

## 2019-09-29 DIAGNOSIS — F17210 Nicotine dependence, cigarettes, uncomplicated: Secondary | ICD-10-CM | POA: Diagnosis not present

## 2019-09-29 DIAGNOSIS — I252 Old myocardial infarction: Secondary | ICD-10-CM | POA: Diagnosis not present

## 2019-09-29 DIAGNOSIS — C8304 Small cell B-cell lymphoma, lymph nodes of axilla and upper limb: Secondary | ICD-10-CM | POA: Diagnosis present

## 2019-09-29 DIAGNOSIS — R791 Abnormal coagulation profile: Secondary | ICD-10-CM | POA: Diagnosis present

## 2019-09-29 DIAGNOSIS — Z5112 Encounter for antineoplastic immunotherapy: Secondary | ICD-10-CM | POA: Diagnosis not present

## 2019-09-29 LAB — CBC WITH DIFFERENTIAL/PLATELET
Abs Immature Granulocytes: 0.02 10*3/uL (ref 0.00–0.07)
Abs Immature Granulocytes: 0.03 10*3/uL (ref 0.00–0.07)
Basophils Absolute: 0 10*3/uL (ref 0.0–0.1)
Basophils Absolute: 0 10*3/uL (ref 0.0–0.1)
Basophils Relative: 0 %
Basophils Relative: 0 %
Eosinophils Absolute: 0 10*3/uL (ref 0.0–0.5)
Eosinophils Absolute: 0 10*3/uL (ref 0.0–0.5)
Eosinophils Relative: 0 %
Eosinophils Relative: 0 %
HCT: 42.1 % (ref 39.0–52.0)
HCT: 40.5 % (ref 39.0–52.0)
Hemoglobin: 13.5 g/dL (ref 13.0–17.0)
Hemoglobin: 13.3 g/dL (ref 13.0–17.0)
Immature Granulocytes: 0 %
Immature Granulocytes: 1 %
Lymphocytes Relative: 24 %
Lymphocytes Relative: 27 %
Lymphs Abs: 1.3 10*3/uL (ref 0.7–4.0)
Lymphs Abs: 1.4 10*3/uL (ref 0.7–4.0)
MCH: 29 pg (ref 26.0–34.0)
MCH: 29.5 pg (ref 26.0–34.0)
MCHC: 32.1 g/dL (ref 30.0–36.0)
MCHC: 32.8 g/dL (ref 30.0–36.0)
MCV: 90.3 fL (ref 80.0–100.0)
MCV: 89.8 fL (ref 80.0–100.0)
Monocytes Absolute: 0.6 10*3/uL (ref 0.1–1.0)
Monocytes Absolute: 0.6 10*3/uL (ref 0.1–1.0)
Monocytes Relative: 11 %
Monocytes Relative: 11 %
Neutro Abs: 3.4 10*3/uL (ref 1.7–7.7)
Neutro Abs: 3.2 10*3/uL (ref 1.7–7.7)
Neutrophils Relative %: 65 %
Neutrophils Relative %: 61 %
Platelets: 232 10*3/uL (ref 150–400)
Platelets: 241 10*3/uL (ref 150–400)
RBC: 4.66 MIL/uL (ref 4.22–5.81)
RBC: 4.51 MIL/uL (ref 4.22–5.81)
RDW: 14 % (ref 11.5–15.5)
RDW: 14.2 % (ref 11.5–15.5)
WBC: 5.3 10*3/uL (ref 4.0–10.5)
WBC: 5.2 10*3/uL (ref 4.0–10.5)
nRBC: 0 % (ref 0.0–0.2)
nRBC: 0 % (ref 0.0–0.2)

## 2019-09-29 LAB — BASIC METABOLIC PANEL
Anion gap: 9 (ref 5–15)
BUN: 15 mg/dL (ref 6–20)
CO2: 25 mmol/L (ref 22–32)
Calcium: 9 mg/dL (ref 8.9–10.3)
Chloride: 104 mmol/L (ref 98–111)
Creatinine, Ser: 0.79 mg/dL (ref 0.61–1.24)
GFR calc Af Amer: >60 mL/min (ref >60)
GFR calc non Af Amer: >60 mL/min (ref >60)
Glucose, Bld: 91 mg/dL (ref 70–99)
Potassium: 4.1 mmol/L (ref 3.5–5.1)
Sodium: 138 mmol/L (ref 135–145)

## 2019-09-29 LAB — PHOSPHORUS: Phosphorus: 3.7 mg/dL (ref 2.5–4.6)

## 2019-09-29 LAB — COMPREHENSIVE METABOLIC PANEL
ALT: 14 U/L (ref 0–44)
AST: 15 U/L (ref 15–41)
Albumin: 3.8 g/dL (ref 3.5–5.0)
Alkaline Phosphatase: 73 U/L (ref 38–126)
Anion gap: 8 (ref 5–15)
BUN: 15 mg/dL (ref 6–20)
CO2: 25 mmol/L (ref 22–32)
Calcium: 9 mg/dL (ref 8.9–10.3)
Chloride: 104 mmol/L (ref 98–111)
Creatinine, Ser: 0.78 mg/dL (ref 0.61–1.24)
GFR calc Af Amer: >60 mL/min (ref >60)
GFR calc non Af Amer: >60 mL/min (ref >60)
Glucose, Bld: 93 mg/dL (ref 70–99)
Potassium: 4 mmol/L (ref 3.5–5.1)
Sodium: 137 mmol/L (ref 135–145)
Total Bilirubin: 0.6 mg/dL (ref 0.3–1.2)
Total Protein: 6.9 g/dL (ref 6.5–8.1)

## 2019-09-29 LAB — PROTIME-INR
INR: 1 (ref 0.8–1.2)
Prothrombin Time: 13.2 seconds (ref 11.4–15.2)

## 2019-09-29 LAB — APTT: aPTT: 30 seconds (ref 24–36)

## 2019-09-29 LAB — URIC ACID: Uric Acid, Serum: 4 mg/dL (ref 3.7–8.6)

## 2019-09-29 LAB — MAGNESIUM: Magnesium: 1.9 mg/dL (ref 1.7–2.4)

## 2019-09-29 LAB — LACTATE DEHYDROGENASE: LDH: 142 U/L (ref 98–192)

## 2019-09-29 MED ORDER — DIPHENHYDRAMINE HCL 50 MG/ML IJ SOLN
50.0000 mg | Freq: Once | INTRAMUSCULAR | Status: AC
  Administered 2019-09-29: 50 mg via INTRAVENOUS
  Filled 2019-09-29: qty 1

## 2019-09-29 MED ORDER — SODIUM CHLORIDE 0.9 % IV SOLN
1000.0000 mg | Freq: Once | INTRAVENOUS | Status: AC
  Administered 2019-09-29: 1000 mg via INTRAVENOUS
  Filled 2019-09-29: qty 40

## 2019-09-29 MED ORDER — ACETAMINOPHEN 325 MG PO TABS
650.0000 mg | ORAL_TABLET | Freq: Once | ORAL | Status: AC
  Administered 2019-09-29: 650 mg via ORAL
  Filled 2019-09-29: qty 2

## 2019-09-29 MED ORDER — SODIUM CHLORIDE 0.9 % IV SOLN: Freq: Once | INTRAVENOUS | Status: AC

## 2019-09-29 MED ORDER — SODIUM CHLORIDE 0.9 % IV SOLN
20.0000 mg | Freq: Once | INTRAVENOUS | Status: AC
  Administered 2019-09-29: 20 mg via INTRAVENOUS
  Filled 2019-09-29: qty 20

## 2019-09-29 MED ORDER — FAMOTIDINE IN NACL 20-0.9 MG/50ML-% IV SOLN
20.0000 mg | Freq: Once | INTRAVENOUS | Status: AC
  Administered 2019-09-29: 20 mg via INTRAVENOUS
  Filled 2019-09-29: qty 50

## 2019-09-29 NOTE — Progress Notes (Signed)
Patrick Rubio, [place] [address]   CLINIC:  Medical Oncology/Hematology  PCP:  Orpah Greek, MD [address], [address] [place] [address] [phone]   REASON FOR VISIT:  Small lymphocytic lymphoma.  CURRENT THERAPY: Venetoclax and Gazyva.  INTERVAL HISTORY:  Patrick Rubio 59 y.o. male seen for follow-up of for small lymphocytic lymphoma and toxicity assessment prior to cycle 5 of obinutuzumab.  Reports continuing to have musculoskeletal symptoms with the joint pains with venetoclax.  Appetite and energy levels are 50%.  He did not have any further chest pain since about a month ago.  He has intermittent diarrhea which is stable.  REVIEW OF SYSTEMS:  Review of Systems  Gastrointestinal: Positive for diarrhea.  Musculoskeletal: Positive for arthralgias.  All other systems reviewed and are negative.    PAST MEDICAL/SURGICAL HISTORY:  Past Medical History:  Diagnosis Date  . CAD (coronary artery disease)   . GERD (gastroesophageal reflux disease)   . HTN (hypertension)   . Hypercholesterolemia   . MI (myocardial infarction) ([place]) 2003   Past Surgical History:  Procedure Laterality Date  . AXILLARY LYMPH NODE BIOPSY Left 04/27/2019   Procedure: AXILLARY LYMPH NODE BIOPSY;  Surgeon: Aviva Signs, MD;  Location: [facility];  Service: General;  Laterality: Left;  . BIOPSY  11/26/2018   Procedure: BIOPSY;  Surgeon: Daneil Dolin, MD;  Location: [facility];  Service: Endoscopy;;  . CHOLECYSTECTOMY N/A 05/18/2019   Procedure: LAPAROSCOPIC CHOLECYSTECTOMY;  Surgeon: Aviva Signs, MD;  Location: [facility] OR[facility];  Service: General;  Laterality: N/A;  . CORONARY ANGIOPLASTY WITH STENT PLACEMENT     2003  . ESOPHAGOGASTRODUODENOSCOPY N/A 11/26/2018   mild erosive reflux esophagitis, gastric erythema. Negative H.pylori   . INGUINAL HERNIA REPAIR Right 04/27/2019   Procedure: HERNIA REPAIR INGUINAL ADULT;  Surgeon: Aviva Signs, MD;  Location: [facility];   Service: General;  Laterality: Right;  . KNEE SURGERY Right   . lumbar fusion with cage  07/2017  . NASAL SINUS SURGERY    . TONSILLECTOMY AND ADENOIDECTOMY       SOCIAL HISTORY:  Social History   Socioeconomic History  . Marital status: Divorced    Spouse name: Not on file  . Number of children: 2  . Years of education: Not on file  . Highest education level: Not on file  Occupational History  . 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 Smoking status: Current Every Day Smoker    Packs/day: 0.50    Years: 25.00    Pack years: 12.50    Types: Cigarettes  . Smokeless tobacco: Never Used  Substance and Sexual Activity  . Alcohol use: Yes    Comment: occ  . Drug use: Never  . Sexual activity: Yes  Other Topics Concern  . Not on file  Social History Narrative  . Not on file   Social Determinants of Health   Financial Resource Strain: Low Risk   . Difficulty of Paying Living Expenses: Not hard at all  Food Insecurity: No Food Insecurity  . Worried About Charity fundraiser in the Last Year: Never true  . Ran Out of Food in the Last Year: Never true  Transportation Needs: No Transportation Needs  . Lack of Transportation (Medical): No  . Lack of Transportation (Non-Medical): No  Physical Activity: Sufficiently Active  . Days of Exercise per Week: 4 days  . Minutes of Exercise per  Session: 60 min  Stress: No Stress Concern Present  . Feeling of Stress : Not at all  Social Connections: Slightly Isolated  . Frequency of Communication with Friends and Family: More than three times a week  . Frequency of Social Gatherings with Friends and Family: More than three times a week  . Attends Religious Services: More than 4 times per year  . Active Member of Clubs or Organizations: Yes  . Attends Archivist Meetings: 1 to 4 times per year  . Marital Status: Divorced  Human resources officer Violence: Not At Risk  . Fear of Current or  Ex-Partner: No  . Emotionally Abused: No  . Physically Abused: No  . Sexually Abused: No    FAMILY HISTORY:  Family History  Problem Relation Age of Onset  . Colon polyps Father        thinks he may have had polyps, possibly in his 27s.   [name] Heart attack Father   . Dementia Mother   . Hypotension Mother   . Stroke Sister   . Diabetes Maternal Grandmother   . Stroke Maternal Grandfather   . Cancer Paternal Grandmother   . Congestive Heart Failure Paternal Grandfather   . Kidney cancer Daughter   . Colon cancer Neg Hx   . Pancreatic cancer Neg Hx   . Liver disease Neg Hx     CURRENT MEDICATIONS:  Outpatient Encounter Medications as of 09/29/2019  Medication Sig  . acyclovir (ZOVIRAX) 400 MG tablet Take 1 tablet (400 mg total) by mouth 2 (two) times daily.  [name] allopurinol (ZYLOPRIM) 300 MG tablet TAKE 1 TABLET BY MOUTH EVERY DAY  . aspirin EC 81 MG tablet Take 81 mg by mouth 3 (three) times a week. Pt takes M,W,F  . carvedilol (COREG) 3.125 MG tablet Take 3.125 mg by mouth 2 (two) times daily.  . clopidogrel (PLAVIX) 75 MG tablet Take 75 mg by mouth daily.   [name] escitalopram (LEXAPRO) 20 MG tablet Take 20 mg by mouth daily.   [name] lisinopril (PRINIVIL,ZESTRIL) 10 MG tablet Take 10 mg by mouth daily.   . meloxicam (MOBIC) 7.5 MG tablet Take 1 tablet (7.5 mg total) by mouth daily.  [name] obinutuzumab in sodium chloride 0.9 % 250 mL Inject into the vein.  . pantoprazole (PROTONIX) 40 MG tablet Take 40 mg by mouth as needed.   . simvastatin (ZOCOR) 40 MG tablet Take 40 mg by mouth every other day.   . sulfamethoxazole-trimethoprim (BACTRIM DS) 800-160 MG tablet Take 1 tablet by mouth 3 (three) times a week.  . venetoclax (VENCLEXTA) 100 MG TABS Take 200 mg by mouth daily.  . baclofen (LIORESAL) 10 MG tablet Take 10 mg by mouth 2 (two) times daily as needed for muscle spasms.   . cimetidine (TAGAMET) 200 MG tablet Take 200 mg by mouth as needed.   . gabapentin (NEURONTIN) 100 MG capsule Take  100 mg by mouth 2 (two) times daily as needed (pain).   . naproxen sodium (ALEVE) 220 MG tablet Take 220-440 mg by mouth 2 (two) times daily as needed (pain).   . nitroGLYCERIN (NITROSTAT) 0.4 MG SL tablet SMARTSIG:1 Tablet(s) Sublingual As Needed   No facility-administered encounter medications on file as of 09/29/2019.    ALLERGIES:  No Known Allergies   PHYSICAL EXAM:  ECOG Performance status: 1  Vitals:   09/29/19 0821 09/29/19 0823  BP: 122/73 122/73  Pulse: 63 63  Resp: 18 18  Temp: (!) 97.1 F (36.2 C) (!)  97.1 F (36.2 C)  SpO2: 100% 100%   Filed Weights   09/29/19 0821 09/29/19 0823  Weight: 198 lb 9.6 oz (90.1 kg) 198 lb 9.6 oz (90.1 kg)    Physical Exam Vitals reviewed.  Constitutional:      Appearance: Normal appearance.  Cardiovascular:     Rate and Rhythm: Normal rate and regular rhythm.     Heart sounds: Normal heart sounds.  Pulmonary:     Effort: Pulmonary effort is normal.     Breath sounds: Normal breath sounds.  Abdominal:     General: There is no distension.     Palpations: Abdomen is soft. There is no mass.  Musculoskeletal:        General: No swelling.  Skin:    General: Skin is warm.  Neurological:     General: No focal deficit present.     Mental Status: He is alert and oriented to person, place, and time.  Psychiatric:        Mood and Affect: Mood normal.        Behavior: Behavior normal.      LABORATORY DATA:  I have reviewed the labs as listed.  CBC    Component Value Date/Time   WBC 5.3 09/29/2019 0808   WBC 5.2 09/29/2019 0808   RBC 4.66 09/29/2019 0808   RBC 4.51 09/29/2019 0808   HGB 13.5 09/29/2019 0808   HGB 13.3 09/29/2019 0808   HCT 42.1 09/29/2019 0808   HCT 40.5 09/29/2019 0808   PLT 232 09/29/2019 0808   PLT 241 09/29/2019 0808   MCV 90.3 09/29/2019 0808   MCV 89.8 09/29/2019 0808   MCH 29.0 09/29/2019 0808   MCH 29.5 09/29/2019 0808   MCHC 32.1 09/29/2019 0808   MCHC 32.8 09/29/2019 0808   RDW 14.0  09/29/2019 0808   RDW 14.2 09/29/2019 0808   LYMPHSABS 1.3 09/29/2019 0808   LYMPHSABS 1.4 09/29/2019 0808   MONOABS 0.6 09/29/2019 0808   MONOABS 0.6 09/29/2019 0808   EOSABS 0.0 09/29/2019 0808   EOSABS 0.0 09/29/2019 0808   BASOSABS 0.0 09/29/2019 0808   BASOSABS 0.0 09/29/2019 0808   CMP Latest Ref Rng & Units 09/29/2019 09/29/2019 09/15/2019  Glucose 70 - 99 mg/dL 91 93 92  BUN 6 - 20 mg/dL 15 15 13   Creatinine 0.61 - 1.24 mg/dL 0.79 0.78 0.81  Sodium 135 - 145 mmol/L 138 137 138  Potassium 3.5 - 5.1 mmol/L 4.1 4.0 4.0  Chloride 98 - 111 mmol/L 104 104 104  CO2 22 - 32 mmol/L 25 25 26   Calcium 8.9 - 10.3 mg/dL 9.0 9.0 9.0  Total Protein 6.5 - 8.1 g/dL - 6.9 6.9  Total Bilirubin 0.3 - 1.2 mg/dL - 0.6 1.0  Alkaline Phos 38 - 126 U/L - 73 83  AST 15 - 41 U/L - 15 18  ALT 0 - 44 U/L - 14 17       DIAGNOSTIC IMAGING:  I have independently reviewed the scans and discussed with the patient.    ASSESSMENT & PLAN:   Small lymphocytic lymphoma ([place]) 1.  Clinical stage IIIb small lymphocytic lymphoma: -4 cycles of obinutuzumab from 06/09/2019 through 09/01/2019. -Venetoclax increased to 400 mg on 08/04/2019. -He had chest pain on the night of cycle 4.  He was evaluated by Dr. Sabra Heck with a Lexiscan.  Perfusion study was abnormal with moderate inferolateral fixed defect.  He had history of MI in the past. -Dr. Sabra Heck is doing a cardiac catheterization Friday. -  We have reviewed his CBC which shows white count 5.3 and platelet count 232 with 65% neutrophils.  Hemoglobin was 13.5. -He will proceed with his cycle 5 of obinutuzumab today. -I have decreased the dose of venetoclax to 200 mg daily as he was started on carvedilol by Dr. Sabra Heck. -I will reevaluate him in 2 weeks.  2.  Musculoskeletal pains: -He developed joint pains in the knees, elbows and shoulders since venetoclax was started. -She will continue Mobic 7.5 mg daily which is helping.  He is not taking hydrocodone.  3.  TLS  prophylaxis: -He will continue allopurinol 300 mg daily.  Last uric acid was normal.  4.  Infection prophylaxis: -He will continue Bactrim 3 times a week.  Could not tolerate acyclovir.  5.  Weight loss: -He does not have good appetite.  However he is maintaining his weight lately.     Orders placed this encounter:  No orders of the defined types were placed in this encounter.     Derek Jack, MD [place] [phone]

## 2019-09-29 NOTE — Progress Notes (Signed)
Patient has been assessed, vital signs and labs have been reviewed by Dr. Katragadda. ANC, Creatinine, LFTs, and Platelets are within treatment parameters per Dr. Katragadda. The patient is good to proceed with treatment at this time.  

## 2019-09-29 NOTE — Patient Instructions (Addendum)
[place] at [facility] Discharge Instructions  You were seen today by Dr. Delton Coombes. He went over your recent lab results. Continue taking Venetoclax as directed. Continue taking all other medications as directed. He will see you back in 2 weeks for labs and follow up.   Thank you for choosing [place] at [facility] to provide your oncology and hematology care.  To afford each patient quality time with our provider, please arrive at least 15 minutes before your scheduled appointment time.   If you have a lab appointment with the [facility] please come in thru the  Main Entrance and check in at the main information desk  You need to re-schedule your appointment should you arrive 10 or more minutes late.  We strive to give you quality time with our providers, and arriving late affects you and other patients whose appointments are after yours.  Also, if you no show three or more times for appointments you may be dismissed from the clinic at the providers discretion.     Again, thank you for choosing [facility].  Our hope is that these requests will decrease the amount of time that you wait before being seen by our physicians.       _____________________________________________________________  Should you have questions after your visit to [facility], please contact our office at [phone] between the hours of 8:00 a.m. and 4:30 p.m.  Voicemails left after 4:00 p.m. will not be returned until the following business day.  For prescription refill requests, have your pharmacy contact our office and allow 72 hours.    Cancer Center Support Programs:   > Cancer Support Group  2nd Tuesday of the month 1pm-2pm, Journey Room

## 2019-09-29 NOTE — Assessment & Plan Note (Addendum)
1.  Clinical stage IIIb small lymphocytic lymphoma: -4 cycles of obinutuzumab from 06/09/2019 through 09/01/2019. -Venetoclax increased to 400 mg on 08/04/2019. -He had chest pain on the night of cycle 4.  He was evaluated by Dr. Sabra Heck with a Lexiscan.  Perfusion study was abnormal with moderate inferolateral fixed defect.  He had history of MI in the past. -Dr. Sabra Heck is doing a cardiac catheterization Friday. -We have reviewed his CBC which shows white count 5.3 and platelet count 232 with 65% neutrophils.  Hemoglobin was 13.5. -He will proceed with his cycle 5 of obinutuzumab today. -I have decreased the dose of venetoclax to 200 mg daily as he was started on carvedilol by Dr. Sabra Heck. -I will reevaluate him in 2 weeks.  2.  Musculoskeletal pains: -He developed joint pains in the knees, elbows and shoulders since venetoclax was started. -She will continue Mobic 7.5 mg daily which is helping.  He is not taking hydrocodone.  3.  TLS prophylaxis: -He will continue allopurinol 300 mg daily.  Last uric acid was normal.  4.  Infection prophylaxis: -He will continue Bactrim 3 times a week.  Could not tolerate acyclovir.  5.  Weight loss: -He does not have good appetite.  However he is maintaining his weight lately.

## 2019-09-29 NOTE — Patient Instructions (Signed)
[facility] Discharge Instructions for Patients Receiving Chemotherapy   Beginning January 23rd 2017 lab work for the Cancer Center will be done in the  Main lab at [facility] on 1st floor. If you have a lab appointment with the Cancer Center please come in thru the  Main Entrance and check in at the main information desk   Today you received the following chemotherapy agents Gazyva  To help prevent nausea and vomiting after your treatment, we encourage you to take your nausea medication   If you develop nausea and vomiting, or diarrhea that is not controlled by your medication, call the clinic.  The clinic phone number is [phone]. Office hours are Monday-Friday 8:30am-5:00pm.  BELOW ARE SYMPTOMS THAT SHOULD BE REPORTED IMMEDIATELY:  *FEVER GREATER THAN 101.0 F  *CHILLS WITH OR WITHOUT FEVER  NAUSEA AND VOMITING THAT IS NOT CONTROLLED WITH YOUR NAUSEA MEDICATION  *UNUSUAL SHORTNESS OF BREATH  *UNUSUAL BRUISING OR BLEEDING  TENDERNESS IN MOUTH AND THROAT WITH OR WITHOUT PRESENCE OF ULCERS  *URINARY PROBLEMS  *BOWEL PROBLEMS  UNUSUAL RASH Items with * indicate a potential emergency and should be followed up as soon as possible. If you have an emergency after office hours please contact your primary care physician or go to the nearest emergency department.  Please call the clinic during office hours if you have any questions or concerns.   You may also contact the Patient Navigator at [phone] should you have any questions or need assistance in obtaining follow up care.      Resources For Cancer Patients and their Caregivers ? American Cancer Society: Can assist with transportation, wigs, general needs, runs Look Good Feel Better.        1-888-227-6333 ? Cancer Care: Provides financial assistance, online support groups, medication/co-pay assistance.  1-800-813-HOPE (4673) ? [facility] Assists [place] cancer  patients and their families through emotional , educational and financial support.  336-427-4357 ? [place] DSS Where to apply for food stamps, Medicaid and utility assistance. 336-342-1394 ? [facility]: Transportation to medical appointments. 336-347-2287 ? Social Security Administration: May apply for disability if have a Stage IV cancer. 336-342-7796 1-800-772-1213 ? [place] Aging, Disability and Transit Services: Assists with nutrition, care and transit needs. 336-349-2343          

## 2019-09-29 NOTE — Progress Notes (Signed)
0900- Patient seen by Dr. Delton Coombes and lab results reviewed. Per MD, ok to proceed with treatment today.  Sung Amabile tolerated treatment without incident or complaint. VSS upon completion of treatment. Peripheral IV noted for positive blood return prior to, during, and after infusion by 2 RNs. Pt discharged in satisfactory condition with follow up instructions.

## 2019-10-14 ENCOUNTER — Other Ambulatory Visit: Payer: Self-pay

## 2019-10-14 ENCOUNTER — Inpatient Hospital Stay (HOSPITAL_COMMUNITY): Payer: 59

## 2019-10-14 ENCOUNTER — Inpatient Hospital Stay (HOSPITAL_BASED_OUTPATIENT_CLINIC_OR_DEPARTMENT_OTHER): Payer: 59 | Admitting: Hematology

## 2019-10-14 DIAGNOSIS — Z5112 Encounter for antineoplastic immunotherapy: Secondary | ICD-10-CM | POA: Diagnosis not present

## 2019-10-14 DIAGNOSIS — C8300 Small cell B-cell lymphoma, unspecified site: Secondary | ICD-10-CM

## 2019-10-14 LAB — PHOSPHORUS: Phosphorus: 4.2 mg/dL (ref 2.5–4.6)

## 2019-10-14 LAB — CBC WITH DIFFERENTIAL/PLATELET
Abs Immature Granulocytes: 0.03 10*3/uL (ref 0.00–0.07)
Basophils Absolute: 0 10*3/uL (ref 0.0–0.1)
Basophils Relative: 1 %
Eosinophils Absolute: 0 10*3/uL (ref 0.0–0.5)
Eosinophils Relative: 0 %
HCT: 40.5 % (ref 39.0–52.0)
Hemoglobin: 13.1 g/dL (ref 13.0–17.0)
Immature Granulocytes: 1 %
Lymphocytes Relative: 19 %
Lymphs Abs: 1.3 10*3/uL (ref 0.7–4.0)
MCH: 29.4 pg (ref 26.0–34.0)
MCHC: 32.3 g/dL (ref 30.0–36.0)
MCV: 91 fL (ref 80.0–100.0)
Monocytes Absolute: 0.8 10*3/uL (ref 0.1–1.0)
Monocytes Relative: 12 %
Neutro Abs: 4.4 10*3/uL (ref 1.7–7.7)
Neutrophils Relative %: 67 %
Platelets: 199 10*3/uL (ref 150–400)
RBC: 4.45 MIL/uL (ref 4.22–5.81)
RDW: 14.3 % (ref 11.5–15.5)
WBC: 6.5 10*3/uL (ref 4.0–10.5)
nRBC: 0 % (ref 0.0–0.2)

## 2019-10-14 LAB — COMPREHENSIVE METABOLIC PANEL
ALT: 17 U/L (ref 0–44)
AST: 18 U/L (ref 15–41)
Albumin: 4 g/dL (ref 3.5–5.0)
Alkaline Phosphatase: 73 U/L (ref 38–126)
Anion gap: 10 (ref 5–15)
BUN: 15 mg/dL (ref 6–20)
CO2: 25 mmol/L (ref 22–32)
Calcium: 9.1 mg/dL (ref 8.9–10.3)
Chloride: 103 mmol/L (ref 98–111)
Creatinine, Ser: 0.79 mg/dL (ref 0.61–1.24)
GFR calc Af Amer: >60 mL/min (ref >60)
GFR calc non Af Amer: >60 mL/min (ref >60)
Glucose, Bld: 72 mg/dL (ref 70–99)
Potassium: 3.9 mmol/L (ref 3.5–5.1)
Sodium: 138 mmol/L (ref 135–145)
Total Bilirubin: 0.7 mg/dL (ref 0.3–1.2)
Total Protein: 6.7 g/dL (ref 6.5–8.1)

## 2019-10-14 LAB — MAGNESIUM: Magnesium: 1.8 mg/dL (ref 1.7–2.4)

## 2019-10-14 LAB — URIC ACID: Uric Acid, Serum: 3.7 mg/dL (ref 3.7–8.6)

## 2019-10-14 NOTE — Patient Instructions (Addendum)
[place] at [facility] Discharge Instructions  You were seen today by Dr. Delton Coombes. He went over your recent lab results. Continue taking two Venetoclax daily.  He will see you back in 2 weeks for labs, treatment and follow up.   Thank you for choosing [place] at [facility] to provide your oncology and hematology care.  To afford each patient quality time with our provider, please arrive at least 15 minutes before your scheduled appointment time.   If you have a lab appointment with the [facility] please come in thru the  Main Entrance and check in at the main information desk  You need to re-schedule your appointment should you arrive 10 or more minutes late.  We strive to give you quality time with our providers, and arriving late affects you and other patients whose appointments are after yours.  Also, if you no show three or more times for appointments you may be dismissed from the clinic at the providers discretion.     Again, thank you for choosing [facility].  Our hope is that these requests will decrease the amount of time that you wait before being seen by our physicians.       _____________________________________________________________  Should you have questions after your visit to [facility], please contact our office at [phone] between the hours of 8:00 a.m. and 4:30 p.m.  Voicemails left after 4:00 p.m. will not be returned until the following business day.  For prescription refill requests, have your pharmacy contact our office and allow 72 hours.    Cancer Center Support Programs:   > Cancer Support Group  2nd Tuesday of the month 1pm-2pm, Journey Room

## 2019-10-14 NOTE — Progress Notes (Signed)
[place], [place] [address]   CLINIC:  Medical Oncology/Hematology  PCP:  Orpah Greek, MD [address], [address] [place] [address] [phone]   REASON FOR VISIT:  Small lymphocytic lymphoma.  CURRENT THERAPY: Venetoclax and Gazyva.  INTERVAL HISTORY:  Mr. Gawlik 59 y.o. male seen for follow-up of CLL and toxicity assessment.  He is continuing venetoclax daily.  He missed 1 dose on the day of his cardiac catheterization.  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  He was started on 2 new medications by Dr. Sabra Heck.  Appetite is 25%.  He is forcing himself to eat.  Energy levels are 50%.  Denies any fevers or chills.  REVIEW OF SYSTEMS:  Review of Systems  Musculoskeletal: Positive for arthralgias.  All other systems reviewed and are negative.    PAST MEDICAL/SURGICAL HISTORY:  Past Medical History:  Diagnosis Date  . CAD (coronary artery disease)   . GERD (gastroesophageal reflux disease)   . HTN (hypertension)   . Hypercholesterolemia   . MI (myocardial infarction) ([place]) 2003   Past Surgical History:  Procedure Laterality Date  . AXILLARY LYMPH NODE BIOPSY Left 04/27/2019   Procedure: AXILLARY LYMPH NODE BIOPSY;  Surgeon: Aviva Signs, MD;  Location: [facility] ORS;  Service: General;  Laterality: Left;  . BIOPSY  11/26/2018   Procedure: BIOPSY;  Surgeon: Daneil Dolin, MD;  Location: [facility] ENDO SUITE;  Service: Endoscopy;;  . CHOLECYSTECTOMY N/A 05/18/2019   Procedure: LAPAROSCOPIC CHOLECYSTECTOMY;  Surgeon: Aviva Signs, MD;  Location: [facility] ORS;  Service: General;  Laterality: N/A;  . CORONARY ANGIOPLASTY WITH STENT PLACEMENT     2003  . ESOPHAGOGASTRODUODENOSCOPY N/A 11/26/2018   mild erosive reflux esophagitis, gastric erythema. Negative H.pylori   . INGUINAL HERNIA REPAIR Right 04/27/2019   Procedure: HERNIA REPAIR INGUINAL ADULT;  Surgeon: Aviva Signs, MD;  Location: [facility] ORS;  Service: General;   Laterality: Right;  . KNEE SURGERY Right   . lumbar fusion with cage  07/2017  . NASAL SINUS SURGERY    . TONSILLECTOMY AND ADENOIDECTOMY       SOCIAL HISTORY:  Social History   Socioeconomic History  . Marital status: Divorced    Spouse name: Not on file  . Number of children: 2  . Years of education: Not on file  . Highest education level: Not on file  Occupational History  . 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 Smoking status: Current Every Day Smoker    Packs/day: 0.50    Years: 25.00    Pack years: 12.50    Types: Cigarettes  . Smokeless tobacco: Never Used  Substance and Sexual Activity  . Alcohol use: Yes    Comment: occ  . Drug use: Never  . Sexual activity: Yes  Other Topics Concern  . Not on file  Social History Narrative  . Not on file   Social Determinants of Health   Financial Resource Strain: Low Risk   . Difficulty of Paying Living Expenses: Not hard at all  Food Insecurity: No Food Insecurity  . Worried About Charity fundraiser in the Last Year: Never true  . Ran Out of Food in the Last Year: Never true  Transportation Needs: No Transportation Needs  . Lack of Transportation (Medical): No  . Lack of Transportation (Non-Medical): No  Physical Activity: Sufficiently Active  . Days of Exercise  per Week: 4 days  . Minutes of Exercise per Session: 60 min  Stress: No Stress Concern Present  . Feeling of Stress : Not at all  Social Connections: Slightly Isolated  . Frequency of Communication with Friends and Family: More than three times a week  . Frequency of Social Gatherings with Friends and Family: More than three times a week  . Attends Religious Services: More than 4 times per year  . Active Member of Clubs or Organizations: Yes  . Attends Archivist Meetings: 1 to 4 times per year  . Marital Status: Divorced  Human resources officer Violence: Not At Risk  . Fear of Current or Ex-Partner: No  .  Emotionally Abused: No  . Physically Abused: No  . Sexually Abused: No    FAMILY HISTORY:  Family History  Problem Relation Age of Onset  . Colon polyps Father        thinks he may have had polyps, possibly in his 20s.   [name] Heart attack Father   . Dementia Mother   . Hypotension Mother   . Stroke Sister   . Diabetes Maternal Grandmother   . Stroke Maternal Grandfather   . Cancer Paternal Grandmother   . Congestive Heart Failure Paternal Grandfather   . Kidney cancer Daughter   . Colon cancer Neg Hx   . Pancreatic cancer Neg Hx   . Liver disease Neg Hx     CURRENT MEDICATIONS:  Outpatient Encounter Medications as of 10/14/2019  Medication Sig  . allopurinol (ZYLOPRIM) 300 MG tablet TAKE 1 TABLET BY MOUTH EVERY DAY  . aspirin EC 81 MG tablet Take 81 mg by mouth 3 (three) times a week. Pt takes M,W,F  . carvedilol (COREG) 3.125 MG tablet Take 3.125 mg by mouth 2 (two) times daily.  . cimetidine (TAGAMET) 200 MG tablet Take 200 mg by mouth as needed.   [name] escitalopram (LEXAPRO) 20 MG tablet Take 20 mg by mouth daily.   [name] lisinopril (PRINIVIL,ZESTRIL) 10 MG tablet Take 10 mg by mouth daily.   . meloxicam (MOBIC) 7.5 MG tablet Take 1 tablet (7.5 mg total) by mouth daily.  [name] obinutuzumab in sodium chloride 0.9 % 250 mL Inject into the vein.  . pantoprazole (PROTONIX) 40 MG tablet Take 40 mg by mouth as needed.   . Pitavastatin Calcium (LIVALO) 4 MG TABS Take 4 mg by mouth daily.  . prasugrel (EFFIENT) 10 MG TABS tablet Take 10 mg by mouth daily.  [name] sulfamethoxazole-trimethoprim (BACTRIM DS) 800-160 MG tablet Take 1 tablet by mouth 3 (three) times a week.  . venetoclax (VENCLEXTA) 100 MG TABS Take 200 mg by mouth daily.  [name] acyclovir (ZOVIRAX) 400 MG tablet Take 1 tablet (400 mg total) by mouth 2 (two) times daily. (Patient not taking: Reported on 10/14/2019)  . baclofen (LIORESAL) 10 MG tablet Take 10 mg by mouth 2 (two) times daily as needed for muscle spasms.   [name] gabapentin  (NEURONTIN) 100 MG capsule Take 100 mg by mouth 2 (two) times daily as needed (pain).   . naproxen sodium (ALEVE) 220 MG tablet Take 220-440 mg by mouth 2 (two) times daily as needed (pain).   . nitroGLYCERIN (NITROSTAT) 0.4 MG SL tablet SMARTSIG:1 Tablet(s) Sublingual As Needed  . [DISCONTINUED] clopidogrel (PLAVIX) 75 MG tablet Take 75 mg by mouth daily.   . [DISCONTINUED] simvastatin (ZOCOR) 40 MG tablet Take 40 mg by mouth every other day.    No facility-administered encounter medications on file as of  10/14/2019.    ALLERGIES:  No Known Allergies   PHYSICAL EXAM:  ECOG Performance status: 1  Vitals:   10/14/19 1034  BP: 108/72  Pulse: 60  Resp: 18  Temp: (!) 97.3 F (36.3 C)  SpO2: 99%   Filed Weights   10/14/19 1034  Weight: 198 lb 8 oz (90 kg)    Physical Exam Vitals reviewed.  Constitutional:      Appearance: Normal appearance.  Cardiovascular:     Rate and Rhythm: Normal rate and regular rhythm.     Heart sounds: Normal heart sounds.  Pulmonary:     Effort: Pulmonary effort is normal.     Breath sounds: Normal breath sounds.  Abdominal:     General: There is no distension.     Palpations: Abdomen is soft. There is no mass.  Musculoskeletal:        General: No swelling.  Skin:    General: Skin is warm.  Neurological:     General: No focal deficit present.     Mental Status: He is alert and oriented to person, place, and time.  Psychiatric:        Mood and Affect: Mood normal.        Behavior: Behavior normal.      LABORATORY DATA:  I have reviewed the labs as listed.  CBC    Component Value Date/Time   WBC 6.5 10/14/2019 0951   RBC 4.45 10/14/2019 0951   HGB 13.1 10/14/2019 0951   HCT 40.5 10/14/2019 0951   PLT 199 10/14/2019 0951   MCV 91.0 10/14/2019 0951   MCH 29.4 10/14/2019 0951   MCHC 32.3 10/14/2019 0951   RDW 14.3 10/14/2019 0951   LYMPHSABS 1.3 10/14/2019 0951   MONOABS 0.8 10/14/2019 0951   EOSABS 0.0 10/14/2019 0951    BASOSABS 0.0 10/14/2019 0951   CMP Latest Ref Rng & Units 10/14/2019 09/29/2019 09/29/2019  Glucose 70 - 99 mg/dL 72 91 93  BUN 6 - 20 mg/dL 15 15 15   Creatinine 0.61 - 1.24 mg/dL 0.79 0.79 0.78  Sodium 135 - 145 mmol/L 138 138 137  Potassium 3.5 - 5.1 mmol/L 3.9 4.1 4.0  Chloride 98 - 111 mmol/L 103 104 104  CO2 22 - 32 mmol/L 25 25 25   Calcium 8.9 - 10.3 mg/dL 9.1 9.0 9.0  Total Protein 6.5 - 8.1 g/dL 6.7 - 6.9  Total Bilirubin 0.3 - 1.2 mg/dL 0.7 - 0.6  Alkaline Phos 38 - 126 U/L 73 - 73  AST 15 - 41 U/L 18 - 15  ALT 0 - 44 U/L 17 - 14       DIAGNOSTIC IMAGING:  I have reviewed the scans.    ASSESSMENT & PLAN:   Small lymphocytic lymphoma ([place]) 1.  Clinical stage IIIb small lymphocytic lymphoma: -5 cycles of obinutuzumab from 06/09/2019 through 09/29/2019. -He had cardiac catheterization and had a stent placement. -He is started on Effient and Livalo.  I have reviewed drug interactions with venetoclax.  He is on carvedilol.  Hence we dose reduced venetoclax to 200 mg. -I reviewed his labs today.  White count is 6.5 and platelet count is 199.  Creatinine is 0.79. -We will reevaluate him in 2 weeks for his last cycle of Gazyva.  2.  TLS prophylaxis: -Uric acid today 3.7 and phosphate is 4.2.  He will continue allopurinol 300 mg daily.  3.  Musculoskeletal pains: -He developed joint pains in the knees, elbows and shoulders since venetoclax. -He is continuing  Mobic 7.5 mg daily which is helping.  He is not requiring hydrocodone.  4.  Infection prophylaxis: -He could not tolerate acyclovir.  He is continuing Bactrim 3 times a week.  5.  Weight loss: -His weight is stable around 198 for the last couple of months.     Orders placed this encounter:  No orders of the defined types were placed in this encounter.     Derek Jack, MD [place] [phone]

## 2019-10-17 NOTE — Assessment & Plan Note (Signed)
1.  Clinical stage IIIb small lymphocytic lymphoma: -5 cycles of obinutuzumab from 06/09/2019 through 09/29/2019. -He had cardiac catheterization and had a stent placement. -He is started on Effient and Livalo.  I have reviewed drug interactions with venetoclax.  He is on carvedilol.  Hence we dose reduced venetoclax to 200 mg. -I reviewed his labs today.  White count is 6.5 and platelet count is 199.  Creatinine is 0.79. -We will reevaluate him in 2 weeks for his last cycle of Gazyva.  2.  TLS prophylaxis: -Uric acid today 3.7 and phosphate is 4.2.  He will continue allopurinol 300 mg daily.  3.  Musculoskeletal pains: -He developed joint pains in the knees, elbows and shoulders since venetoclax. -He is continuing Mobic 7.5 mg daily which is helping.  He is not requiring hydrocodone.  4.  Infection prophylaxis: -He could not tolerate acyclovir.  He is continuing Bactrim 3 times a week.  5.  Weight loss: -His weight is stable around 198 for the last couple of months.

## 2019-10-22 NOTE — Progress Notes (Signed)

## 2019-10-24 ENCOUNTER — Other Ambulatory Visit (HOSPITAL_COMMUNITY): Payer: Self-pay | Admitting: Nurse Practitioner

## 2019-10-24 DIAGNOSIS — C8300 Small cell B-cell lymphoma, unspecified site: Secondary | ICD-10-CM

## 2019-10-27 ENCOUNTER — Inpatient Hospital Stay (HOSPITAL_COMMUNITY): Payer: 59

## 2019-10-27 ENCOUNTER — Other Ambulatory Visit: Payer: Self-pay

## 2019-10-27 ENCOUNTER — Encounter (HOSPITAL_COMMUNITY): Payer: Self-pay | Admitting: Hematology

## 2019-10-27 ENCOUNTER — Inpatient Hospital Stay (HOSPITAL_BASED_OUTPATIENT_CLINIC_OR_DEPARTMENT_OTHER): Payer: 59 | Admitting: Hematology

## 2019-10-27 DIAGNOSIS — C8300 Small cell B-cell lymphoma, unspecified site: Secondary | ICD-10-CM

## 2019-10-27 DIAGNOSIS — Z5112 Encounter for antineoplastic immunotherapy: Secondary | ICD-10-CM | POA: Diagnosis not present

## 2019-10-27 LAB — CBC WITH DIFFERENTIAL/PLATELET
Abs Immature Granulocytes: 0.04 10*3/uL (ref 0.00–0.07)
Basophils Absolute: 0 10*3/uL (ref 0.0–0.1)
Basophils Relative: 0 %
Eosinophils Absolute: 0 10*3/uL (ref 0.0–0.5)
Eosinophils Relative: 0 %
HCT: 39.1 % (ref 39.0–52.0)
Hemoglobin: 12.8 g/dL — ABNORMAL LOW (ref 13.0–17.0)
Immature Granulocytes: 1 %
Lymphocytes Relative: 18 %
Lymphs Abs: 1 10*3/uL (ref 0.7–4.0)
MCH: 29.7 pg (ref 26.0–34.0)
MCHC: 32.7 g/dL (ref 30.0–36.0)
MCV: 90.7 fL (ref 80.0–100.0)
Monocytes Absolute: 0.7 10*3/uL (ref 0.1–1.0)
Monocytes Relative: 12 %
Neutro Abs: 3.9 10*3/uL (ref 1.7–7.7)
Neutrophils Relative %: 69 %
Platelets: 203 10*3/uL (ref 150–400)
RBC: 4.31 MIL/uL (ref 4.22–5.81)
RDW: 14.5 % (ref 11.5–15.5)
WBC: 5.6 10*3/uL (ref 4.0–10.5)
nRBC: 0 % (ref 0.0–0.2)

## 2019-10-27 LAB — COMPREHENSIVE METABOLIC PANEL
ALT: 17 U/L (ref 0–44)
AST: 16 U/L (ref 15–41)
Albumin: 3.7 g/dL (ref 3.5–5.0)
Alkaline Phosphatase: 70 U/L (ref 38–126)
Anion gap: 8 (ref 5–15)
BUN: 14 mg/dL (ref 6–20)
CO2: 24 mmol/L (ref 22–32)
Calcium: 8.7 mg/dL — ABNORMAL LOW (ref 8.9–10.3)
Chloride: 107 mmol/L (ref 98–111)
Creatinine, Ser: 0.76 mg/dL (ref 0.61–1.24)
GFR calc Af Amer: >60 mL/min (ref >60)
GFR calc non Af Amer: >60 mL/min (ref >60)
Glucose, Bld: 92 mg/dL (ref 70–99)
Potassium: 3.7 mmol/L (ref 3.5–5.1)
Sodium: 139 mmol/L (ref 135–145)
Total Bilirubin: 0.7 mg/dL (ref 0.3–1.2)
Total Protein: 6.1 g/dL — ABNORMAL LOW (ref 6.5–8.1)

## 2019-10-27 LAB — URIC ACID: Uric Acid, Serum: 3.9 mg/dL (ref 3.7–8.6)

## 2019-10-27 LAB — PHOSPHORUS: Phosphorus: 3.7 mg/dL (ref 2.5–4.6)

## 2019-10-27 LAB — MAGNESIUM: Magnesium: 1.8 mg/dL (ref 1.7–2.4)

## 2019-10-27 MED ORDER — ACETAMINOPHEN 325 MG PO TABS
650.0000 mg | ORAL_TABLET | Freq: Once | ORAL | Status: AC
  Administered 2019-10-27: 650 mg via ORAL
  Filled 2019-10-27: qty 2

## 2019-10-27 MED ORDER — DIPHENHYDRAMINE HCL 50 MG/ML IJ SOLN
50.0000 mg | Freq: Once | INTRAMUSCULAR | Status: AC
  Administered 2019-10-27: 50 mg via INTRAVENOUS
  Filled 2019-10-27: qty 1

## 2019-10-27 MED ORDER — SODIUM CHLORIDE 0.9 % IV SOLN: Freq: Once | INTRAVENOUS | Status: AC

## 2019-10-27 MED ORDER — SODIUM CHLORIDE 0.9 % IV SOLN
1000.0000 mg | Freq: Once | INTRAVENOUS | Status: AC
  Administered 2019-10-27: 1000 mg via INTRAVENOUS
  Filled 2019-10-27: qty 40

## 2019-10-27 MED ORDER — SODIUM CHLORIDE 0.9 % IV SOLN
20.0000 mg | Freq: Once | INTRAVENOUS | Status: AC
  Administered 2019-10-27: 20 mg via INTRAVENOUS
  Filled 2019-10-27: qty 20

## 2019-10-27 MED ORDER — FAMOTIDINE IN NACL 20-0.9 MG/50ML-% IV SOLN
20.0000 mg | Freq: Once | INTRAVENOUS | Status: AC
  Administered 2019-10-27: 20 mg via INTRAVENOUS
  Filled 2019-10-27: qty 50

## 2019-10-27 NOTE — Progress Notes (Signed)
Patrick Rubio,  [id number]   CLINIC:  Medical Oncology/Hematology  PCP:  Orpah Greek, MD [address], [address] [place] [address] [phone]   REASON FOR VISIT:  Small lymphocytic lymphoma.  CURRENT THERAPY: Venetoclax and Gazyva.  INTERVAL HISTORY:  Patrick Rubio 59 y.o. male seen for follow-up of CLL and Dr. Marta Rubio prior to cycle 6 of obinutuzumab.  He is taking venetoclax 200 mg daily.  For the last 1 and half weeks, he reported feeling weak and also having a vague midabdominal periumbilical pain.  He does get pain all day sometimes in the sometimes no pain.  Is not associated with eating or bowel movements.  He has tried taking Maalox did not help.  No fevers or night sweats.  No bleeding per rectum or melena.  His only new medications are Effient and Livalo.  Appetite and energy levels are 50%.  REVIEW OF SYSTEMS:  Review of Systems  Gastrointestinal: Positive for abdominal pain.  Musculoskeletal: Positive for arthralgias.  All other systems reviewed and are negative.    PAST MEDICAL/SURGICAL HISTORY:  Past Medical History:  Diagnosis Date  . CAD (coronary artery disease)   . GERD (gastroesophageal reflux disease)   . HTN (hypertension)   . Hypercholesterolemia   . MI (myocardial infarction) ([place]) 2003   Past Surgical History:  Procedure Laterality Date  . AXILLARY LYMPH NODE BIOPSY Left 04/27/2019   Procedure: AXILLARY LYMPH NODE BIOPSY;  Surgeon: Aviva Signs, MD;  Location: [facility] ORS;  Service: General;  Laterality: Left;  . BIOPSY  11/26/2018   Procedure: BIOPSY;  Surgeon: Daneil Dolin, MD;  Location: [facility] END[facility] SUITE;  Service: Endoscopy;;  . CHOLECYSTECTOMY N/A 05/18/2019   Procedure: LAPAROSCOPIC CHOLECYSTECTOMY;  Surgeon: Aviva Signs, MD;  Location: [facility] ORS;  Service: General;  Laterality: N/A;  . CORONARY ANGIOPLASTY WITH STENT PLACEMENT     2003  . ESOPHAGOGASTRODUODENOSCOPY N/A 11/26/2018   mild erosive reflux  esophagitis, gastric erythema. Negative H.pylori   . INGUINAL HERNIA REPAIR Right 04/27/2019   Procedure: HERNIA REPAIR INGUINAL ADULT;  Surgeon: Aviva Signs, MD;  Location: [facility] ORS;  Service: General;  Laterality: Right;  . KNEE SURGERY Right   . lumbar fusion with cage  07/2017  . NASAL SINUS SURGERY    . TONSILLECTOMY AND ADENOIDECTOMY       SOCIAL HISTORY:  Social History   Socioeconomic History  . Marital status: Divorced    Spouse name: Not on file  . Number of children: 2  . Years of education: Not on file  . Highest education level: Not on file  Occupational History  . 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 Smoking status: Current Every Day Smoker    Packs/day: 0.50    Years: 25.00    Pack years: 12.50    Types: Cigarettes  . Smokeless tobacco: Never Used  Substance and Sexual Activity  . Alcohol use: Yes    Comment: occ  . Drug use: Never  . Sexual activity: Yes  Other Topics Concern  . Not on file  Social History Narrative  . Not on file   Social Determinants of Health   Financial Resource Strain: Low Risk   . Difficulty of Paying Living Expenses: Not hard at all  Food Insecurity: No Food Insecurity  . Worried About Charity fundraiser in the Last Year: Never true  . Ran Out of Food in the  Last Year: Never true  Transportation Needs: No Transportation Needs  . Lack of Transportation (Medical): No  . Lack of Transportation (Non-Medical): No  Physical Activity: Sufficiently Active  . Days of Exercise per Week: 4 days  . Minutes of Exercise per Session: 60 min  Stress: No Stress Concern Present  . Feeling of Stress : Not at all  Social Connections: Slightly Isolated  . Frequency of Communication with Friends and Family: More than three times a week  . Frequency of Social Gatherings with Friends and Family: More than three times a week  . Attends Religious Services: More than 4 times per year  . Active Member of Clubs  or Organizations: Yes  . Attends Archivist Meetings: 1 to 4 times per year  . Marital Status: Divorced  Human resources officer Violence: Not At Risk  . Fear of Current or Ex-Partner: No  . Emotionally Abused: No  . Physically Abused: No  . Sexually Abused: No    FAMILY HISTORY:  Family History  Problem Relation Age of Onset  . Colon polyps Father        thinks he may have had polyps, possibly in his 5s.   [name] Heart attack Father   . Dementia Mother   . Hypotension Mother   . Stroke Sister   . Diabetes Maternal Grandmother   . Stroke Maternal Grandfather   . Cancer Paternal Grandmother   . Congestive Heart Failure Paternal Grandfather   . Kidney cancer Daughter   . Colon cancer Neg Hx   . Pancreatic cancer Neg Hx   . Liver disease Neg Hx     CURRENT MEDICATIONS:  Outpatient Encounter Medications as of 10/27/2019  Medication Sig  . acyclovir (ZOVIRAX) 400 MG tablet Take 1 tablet (400 mg total) by mouth 2 (two) times daily. (Patient not taking: Reported on 10/14/2019)  . aspirin EC 81 MG tablet Take 81 mg by mouth 3 (three) times a week. Pt takes M,W,F  . baclofen (LIORESAL) 10 MG tablet Take 10 mg by mouth 2 (two) times daily as needed for muscle spasms.   . carvedilol (COREG) 3.125 MG tablet Take 3.125 mg by mouth 2 (two) times daily.  . cimetidine (TAGAMET) 200 MG tablet Take 200 mg by mouth as needed.   [name] escitalopram (LEXAPRO) 20 MG tablet Take 20 mg by mouth daily.   [name] gabapentin (NEURONTIN) 100 MG capsule Take 100 mg by mouth 2 (two) times daily as needed (pain).   [name] lisinopril (PRINIVIL,ZESTRIL) 10 MG tablet Take 10 mg by mouth daily.   . meloxicam (MOBIC) 7.5 MG tablet Take 1 tablet (7.5 mg total) by mouth daily.  . naproxen sodium (ALEVE) 220 MG tablet Take 220-440 mg by mouth 2 (two) times daily as needed (pain).   . nitroGLYCERIN (NITROSTAT) 0.4 MG SL tablet SMARTSIG:1 Tablet(s) Sublingual As Needed  . obinutuzumab in sodium chloride 0.9 % 250 mL Inject into the  vein.  . pantoprazole (PROTONIX) 40 MG tablet Take 40 mg by mouth as needed.   . Pitavastatin Calcium (LIVALO) 4 MG TABS Take 4 mg by mouth daily.  . prasugrel (EFFIENT) 10 MG TABS tablet Take 10 mg by mouth daily.  [name] sulfamethoxazole-trimethoprim (BACTRIM DS) 800-160 MG tablet Take 1 tablet by mouth 3 (three) times a week.  . venetoclax (VENCLEXTA) 100 MG TABS Take 200 mg by mouth daily.  . [DISCONTINUED] allopurinol (ZYLOPRIM) 300 MG tablet TAKE 1 TABLET BY MOUTH EVERY DAY   No facility-administered encounter medications on  file as of 10/27/2019.    ALLERGIES:  No Known Allergies   PHYSICAL EXAM:  ECOG Performance status: 1  Vitals:   10/27/19 0837  BP: 108/69  Pulse: 60  Resp: 18  Temp: (!) 97.5 F (36.4 C)  SpO2: 100%   Filed Weights   10/27/19 0837  Weight: 197 lb 6.4 oz (89.5 kg)    Physical Exam Vitals reviewed.  Constitutional:      Appearance: Normal appearance.  Cardiovascular:     Rate and Rhythm: Normal rate and regular rhythm.     Heart sounds: Normal heart sounds.  Pulmonary:     Effort: Pulmonary effort is normal.     Breath sounds: Normal breath sounds.  Abdominal:     General: There is no distension.     Palpations: Abdomen is soft. There is no mass.  Musculoskeletal:        General: No swelling.  Skin:    General: Skin is warm.  Neurological:     General: No focal deficit present.     Mental Status: He is alert and oriented to person, place, and time.  Psychiatric:        Mood and Affect: Mood normal.        Behavior: Behavior normal.      LABORATORY DATA:  I have reviewed the labs as listed.  CBC    Component Value Date/Time   WBC 5.6 10/27/2019 0813   RBC 4.31 10/27/2019 0813   HGB 12.8 (L) 10/27/2019 0813   HCT 39.1 10/27/2019 0813   PLT 203 10/27/2019 0813   MCV 90.7 10/27/2019 0813   MCH 29.7 10/27/2019 0813   MCHC 32.7 10/27/2019 0813   RDW 14.5 10/27/2019 0813   LYMPHSABS 1.0 10/27/2019 0813   MONOABS 0.7 10/27/2019  0813   EOSABS 0.0 10/27/2019 0813   BASOSABS 0.0 10/27/2019 0813   CMP Latest Ref Rng & Units 10/27/2019 10/14/2019 09/29/2019  Glucose 70 - 99 mg/dL 92 72 91  BUN 6 - 20 mg/dL 14 15 15   Creatinine 0.61 - 1.24 mg/dL 0.76 0.79 0.79  Sodium 135 - 145 mmol/L 139 138 138  Potassium 3.5 - 5.1 mmol/L 3.7 3.9 4.1  Chloride 98 - 111 mmol/L 107 103 104  CO2 22 - 32 mmol/L 24 25 25   Calcium 8.9 - 10.3 mg/dL 8.7(L) 9.1 9.0  Total Protein 6.5 - 8.1 g/dL 6.1(L) 6.7 -  Total Bilirubin 0.3 - 1.2 mg/dL 0.7 0.7 -  Alkaline Phos 38 - 126 U/L 70 73 -  AST 15 - 41 U/L 16 18 -  ALT 0 - 44 U/L 17 17 -       DIAGNOSTIC IMAGING:  I have reviewed the scans.    ASSESSMENT & PLAN:   Small lymphocytic lymphoma ([place]) 1.  Clinical stage IIIb small lymphocytic lymphoma: -5 cycles of obinutuzumab from 06/09/2019 through 09/29/2019. -Recent cardiac catheterization and stent placement.  He is started on Effient and Livalo. -Reported vague abdominal pain for the last 1 and half weeks associated with low energy levels.  Abdominal pain is not associated with food or bowel movements.  Maalox did not help.  It is in the mid to lower abdomen. -Today he does not have any pain.  It is not severe enough to take any medications. -I have reviewed his labs today.  White count is 5.6 with normal platelet count.  Creatinine is 0.76.  Electrolytes are normal.  LFTs are normal. -He will proceed with his final treatment of  obinutuzumab today.  He will continue venetoclax 200 mg daily.  This was dose reduced secondary to carvedilol. -I will reevaluate him in 2 weeks for follow-up.  He also has follow-up with Dr. Sabra Heck in the next couple of weeks.  2.  TLS prophylaxis: -Uric acid today is 3.9.  He will continue allopurinol 300 mg daily.  Phosphorus 3.7.  Magnesium is 1.8.  Potassium 3.7.  3.  Musculoskeletal symptoms. -He has developed joint pains in the knees, elbows and shoulders since venetoclax. -He is continuing to take  Mobic 7.5 mg daily which is helping.  4.  Infection prophylaxis: -He is continuing Bactrim 3 times a week.  He could not tolerate acyclovir.  5.  Weight loss: -His weight has been more or less stable in the last couple of months.     Orders placed this encounter:  No orders of the defined types were placed in this encounter.     Derek Jack, MD [place] [phone]

## 2019-10-27 NOTE — Progress Notes (Signed)
Patient has been assessed, vital signs and labs have been reviewed by Dr. Katragadda. ANC, Creatinine, LFTs, and Platelets are within treatment parameters per Dr. Katragadda. The patient is good to proceed with treatment at this time.  

## 2019-10-27 NOTE — Patient Instructions (Signed)
[facility] Discharge Instructions for Patients Receiving Chemotherapy  Today you received the following chemotherapy agents   To help prevent nausea and vomiting after your treatment, we encourage you to take your nausea medication   If you develop nausea and vomiting that is not controlled by your nausea medication, call the clinic.   BELOW ARE SYMPTOMS THAT SHOULD BE REPORTED IMMEDIATELY:  *FEVER GREATER THAN 100.5 F  *CHILLS WITH OR WITHOUT FEVER  NAUSEA AND VOMITING THAT IS NOT CONTROLLED WITH YOUR NAUSEA MEDICATION  *UNUSUAL SHORTNESS OF BREATH  *UNUSUAL BRUISING OR BLEEDING  TENDERNESS IN MOUTH AND THROAT WITH OR WITHOUT PRESENCE OF ULCERS  *URINARY PROBLEMS  *BOWEL PROBLEMS  UNUSUAL RASH Items with * indicate a potential emergency and should be followed up as soon as possible.  Feel free to call the clinic should you have any questions or concerns. The clinic phone number is [phone].  Please show the CHEMO ALERT CARD at check-in to the Emergency Department and triage nurse.   

## 2019-10-27 NOTE — Assessment & Plan Note (Signed)
1.  Clinical stage IIIb small lymphocytic lymphoma: -5 cycles of obinutuzumab from 06/09/2019 through 09/29/2019. -Recent cardiac catheterization and stent placement.  He is started on Effient and Livalo. -Reported vague abdominal pain for the last 1 and half weeks associated with low energy levels.  Abdominal pain is not associated with food or bowel movements.  Maalox did not help.  It is in the mid to lower abdomen. -Today he does not have any pain.  It is not severe enough to take any medications. -I have reviewed his labs today.  White count is 5.6 with normal platelet count.  Creatinine is 0.76.  Electrolytes are normal.  LFTs are normal. -He will proceed with his final treatment of obinutuzumab today.  He will continue venetoclax 200 mg daily.  This was dose reduced secondary to carvedilol. -I will reevaluate him in 2 weeks for follow-up.  He also has follow-up with Dr. Sabra Heck in the next couple of weeks.  2.  TLS prophylaxis: -Uric acid today is 3.9.  He will continue allopurinol 300 mg daily.  Phosphorus 3.7.  Magnesium is 1.8.  Potassium 3.7.  3.  Musculoskeletal symptoms. -He has developed joint pains in the knees, elbows and shoulders since venetoclax. -He is continuing to take Mobic 7.5 mg daily which is helping.  4.  Infection prophylaxis: -He is continuing Bactrim 3 times a week.  He could not tolerate acyclovir.  5.  Weight loss: -His weight has been more or less stable in the last couple of months.

## 2019-10-27 NOTE — Patient Instructions (Addendum)
[place] [facility] Discharge Instructions  You were seen today by Dr. Delton Coombes. He went over your recent lab results.  Dr. Delton Coombes recommends contacting Dr. Sabra Heck for his opinion on your abdominal pain.   He will see you back in 2 weeks for labs and follow up.   Thank you for choosing [place] at [facility] to provide your oncology and hematology care.  To afford each patient quality time with our provider, please arrive at least 15 minutes before your scheduled appointment time.   If you have a lab appointment with the [facility] please come in thru the  Main Entrance and check in at the main information desk  You need to re-schedule your appointment should you arrive 10 or more minutes late.  We strive to give you quality time with our providers, and arriving late affects you and other patients whose appointments are after yours.  Also, if you no show three or more times for appointments you may be dismissed from the clinic at the providers discretion.     Again, thank you for choosing [facility].  Our hope is that these requests will decrease the amount of time that you wait before being seen by our physicians.       _____________________________________________________________  Should you have questions after your visit to [facility], please contact our office at [phone] between the hours of 8:00 a.m. and 4:30 p.m.  Voicemails left after 4:00 p.m. will not be returned until the following business day.  For prescription refill requests, have your pharmacy contact our office and allow 72 hours.    Cancer Center Support Programs:   > Cancer Support Group  2nd Tuesday of the month 1pm-2pm, Journey Room

## 2019-10-27 NOTE — Progress Notes (Signed)
Labs reviewed today with MD. Proceed with treatment per MD.   Treatment given per orders. Patient tolerated it well without problems. Vitals stable and discharged home from clinic ambulatory. Follow up as scheduled.

## 2019-11-10 ENCOUNTER — Inpatient Hospital Stay (HOSPITAL_COMMUNITY): Payer: 59

## 2019-11-10 ENCOUNTER — Other Ambulatory Visit: Payer: Self-pay

## 2019-11-10 ENCOUNTER — Inpatient Hospital Stay (HOSPITAL_COMMUNITY): Payer: 59 | Attending: Hematology | Admitting: Hematology

## 2019-11-10 VITALS — BP 114/78 | HR 57 | Temp 96.9°F | Resp 18 | Wt 200.5 lb

## 2019-11-10 DIAGNOSIS — I10 Essential (primary) hypertension: Secondary | ICD-10-CM | POA: Insufficient documentation

## 2019-11-10 DIAGNOSIS — Z8249 Family history of ischemic heart disease and other diseases of the circulatory system: Secondary | ICD-10-CM | POA: Diagnosis not present

## 2019-11-10 DIAGNOSIS — C9110 Chronic lymphocytic leukemia of B-cell type not having achieved remission: Secondary | ICD-10-CM | POA: Insufficient documentation

## 2019-11-10 DIAGNOSIS — I252 Old myocardial infarction: Secondary | ICD-10-CM | POA: Insufficient documentation

## 2019-11-10 DIAGNOSIS — Z8051 Family history of malignant neoplasm of kidney: Secondary | ICD-10-CM | POA: Insufficient documentation

## 2019-11-10 DIAGNOSIS — Z7982 Long term (current) use of aspirin: Secondary | ICD-10-CM | POA: Diagnosis not present

## 2019-11-10 DIAGNOSIS — F17210 Nicotine dependence, cigarettes, uncomplicated: Secondary | ICD-10-CM | POA: Insufficient documentation

## 2019-11-10 DIAGNOSIS — R109 Unspecified abdominal pain: Secondary | ICD-10-CM | POA: Diagnosis not present

## 2019-11-10 DIAGNOSIS — I2510 Atherosclerotic heart disease of native coronary artery without angina pectoris: Secondary | ICD-10-CM | POA: Diagnosis not present

## 2019-11-10 DIAGNOSIS — K219 Gastro-esophageal reflux disease without esophagitis: Secondary | ICD-10-CM | POA: Diagnosis not present

## 2019-11-10 DIAGNOSIS — Z808 Family history of malignant neoplasm of other organs or systems: Secondary | ICD-10-CM | POA: Diagnosis not present

## 2019-11-10 DIAGNOSIS — E7800 Pure hypercholesterolemia, unspecified: Secondary | ICD-10-CM | POA: Insufficient documentation

## 2019-11-10 DIAGNOSIS — Z79899 Other long term (current) drug therapy: Secondary | ICD-10-CM | POA: Insufficient documentation

## 2019-11-10 DIAGNOSIS — Z791 Long term (current) use of non-steroidal anti-inflammatories (NSAID): Secondary | ICD-10-CM | POA: Insufficient documentation

## 2019-11-10 DIAGNOSIS — C8300 Small cell B-cell lymphoma, unspecified site: Secondary | ICD-10-CM

## 2019-11-10 LAB — MAGNESIUM: Magnesium: 1.9 mg/dL (ref 1.7–2.4)

## 2019-11-10 LAB — CBC WITH DIFFERENTIAL/PLATELET
Abs Immature Granulocytes: 0.01 10*3/uL (ref 0.00–0.07)
Basophils Absolute: 0 10*3/uL (ref 0.0–0.1)
Basophils Relative: 0 %
Eosinophils Absolute: 0 10*3/uL (ref 0.0–0.5)
Eosinophils Relative: 0 %
HCT: 38.2 % — ABNORMAL LOW (ref 39.0–52.0)
Hemoglobin: 12.4 g/dL — ABNORMAL LOW (ref 13.0–17.0)
Immature Granulocytes: 0 %
Lymphocytes Relative: 21 %
Lymphs Abs: 1 10*3/uL (ref 0.7–4.0)
MCH: 30.2 pg (ref 26.0–34.0)
MCHC: 32.5 g/dL (ref 30.0–36.0)
MCV: 92.9 fL (ref 80.0–100.0)
Monocytes Absolute: 0.7 10*3/uL (ref 0.1–1.0)
Monocytes Relative: 15 %
Neutro Abs: 3 10*3/uL (ref 1.7–7.7)
Neutrophils Relative %: 64 %
Platelets: 181 10*3/uL (ref 150–400)
RBC: 4.11 MIL/uL — ABNORMAL LOW (ref 4.22–5.81)
RDW: 14.7 % (ref 11.5–15.5)
WBC: 4.7 10*3/uL (ref 4.0–10.5)
nRBC: 0 % (ref 0.0–0.2)

## 2019-11-10 LAB — COMPREHENSIVE METABOLIC PANEL
ALT: 18 U/L (ref 0–44)
AST: 16 U/L (ref 15–41)
Albumin: 3.6 g/dL (ref 3.5–5.0)
Alkaline Phosphatase: 66 U/L (ref 38–126)
Anion gap: 8 (ref 5–15)
BUN: 17 mg/dL (ref 6–20)
CO2: 26 mmol/L (ref 22–32)
Calcium: 8.9 mg/dL (ref 8.9–10.3)
Chloride: 106 mmol/L (ref 98–111)
Creatinine, Ser: 0.85 mg/dL (ref 0.61–1.24)
GFR calc Af Amer: >60 mL/min (ref >60)
GFR calc non Af Amer: >60 mL/min (ref >60)
Glucose, Bld: 89 mg/dL (ref 70–99)
Potassium: 3.9 mmol/L (ref 3.5–5.1)
Sodium: 140 mmol/L (ref 135–145)
Total Bilirubin: 0.8 mg/dL (ref 0.3–1.2)
Total Protein: 6.1 g/dL — ABNORMAL LOW (ref 6.5–8.1)

## 2019-11-10 LAB — URIC ACID: Uric Acid, Serum: 4.5 mg/dL (ref 3.7–8.6)

## 2019-11-10 LAB — LACTATE DEHYDROGENASE: LDH: 144 U/L (ref 98–192)

## 2019-11-10 LAB — PHOSPHORUS: Phosphorus: 3.4 mg/dL (ref 2.5–4.6)

## 2019-11-10 NOTE — Assessment & Plan Note (Addendum)
1.  Clinical stage IIIb small lymphocytic lymphoma: -6 cycles of obinutuzumab from 06/09/2019 through 10/27/2019 -Reported vague abdominal pain in the left mid quadrant, not associated with food or bowel movements.  Reports that this has improved in the last couple of weeks. -We reviewed his labs.  White count is 4.7 with normal platelet count.  Hemoglobin is 12.4.  LDH is normal. -He is continuing venetoclax 200 mg daily.  We have dose reduced it secondary to carvedilol. -I plan to schedule him for PET scan and see him back after the scan to evaluate response.  2.  CAD: -He had cardiac catheterization on 10/02/2019, found to have 100% occlusion of the right coronary artery.  Angioplasty and stenting of the circumflex done.  He is on Effient and Livalo.  3.  TLS prophylaxis: -Uric acid today is 4.5.  Other electrolytes are normal.  He will continue allopurinol 300 mg daily.  4.  Musculoskeletal pains: -He developed joint pains in the knees, elbows and shoulders since venetoclax.  He will continue Mobic 7.5 mg daily.  5.  Infection prophylaxis: -He could not tolerate acyclovir.  He will continue Bactrim 3 times a week.

## 2019-11-10 NOTE — Patient Instructions (Addendum)
[place] at [facility] Discharge Instructions  You were seen today by Dr. Delton Coombes.  He talked with you about how you have been feeling.  He wants you to have another PET scan to check response to treatment.  We will do this at [facility] on the next date that they are here.  We will see you back after the PET.    Thank you for choosing [place] at [facility] to provide your oncology and hematology care.  To afford each patient quality time with our provider, please arrive at least 15 minutes before your scheduled appointment time.   If you have a lab appointment with the [facility] please come in thru the Main Entrance and check in at the main information desk.  You need to re-schedule your appointment should you arrive 10 or more minutes late.  We strive to give you quality time with our providers, and arriving late affects you and other patients whose appointments are after yours.  Also, if you no show three or more times for appointments you may be dismissed from the clinic at the providers discretion.     Again, thank you for choosing [facility].  Our hope is that these requests will decrease the amount of time that you wait before being seen by our physicians.       _____________________________________________________________  Should you have questions after your visit to [facility], please contact our office at [phone] between the hours of 8:00 a.m. and 4:30 p.m.  Voicemails left after 4:00 p.m. will not be returned until the following business day.  For prescription refill requests, have your pharmacy contact our office and allow 72 hours.    Due to Covid, you will need to wear a mask upon entering the hospital. If you do not have a mask, a mask will be given to you at the Main Entrance upon arrival. For doctor visits, patients may have 1 support person with them. For treatment visits, patients can  not have anyone with them due to social distancing guidelines and our immunocompromised population.

## 2019-11-10 NOTE — Progress Notes (Signed)
[place], [place] [address]   CLINIC:  Medical Oncology/Hematology  PCP:  Orpah Greek, MD [address] [place] [address] [phone]   REASON FOR VISIT:  Small lymphocytic lymphoma.  CURRENT THERAPY: Venetoclax and Gazyva.  INTERVAL HISTORY:  Patrick Rubio 59 y.o. male seen for follow-up of CLL and toxicity assessment.  He is continuing venetoclax 200 mg daily.  He received last Gazyva on 10/27/2019.  He reported some improvement in the left-sided abdominal pain in the last 2 weeks.  Has been eating well and has not lost any weight.  Arthralgias are well controlled with Mobic.  Numbness in the hands is also stable.  Denies any nausea, vomiting or diarrhea.  REVIEW OF SYSTEMS:  Review of Systems  Gastrointestinal: Positive for abdominal pain.  Musculoskeletal: Positive for arthralgias.  All other systems reviewed and are negative.    PAST MEDICAL/SURGICAL HISTORY:  Past Medical History:  Diagnosis Date  . CAD (coronary artery disease)   . GERD (gastroesophageal reflux disease)   . HTN (hypertension)   . Hypercholesterolemia   . MI (myocardial infarction) ([place]) 2003   Past Surgical History:  Procedure Laterality Date  . AXILLARY LYMPH NODE BIOPSY Left 04/27/2019   Procedure: AXILLARY LYMPH NODE BIOPSY;  Surgeon: Aviva Signs, MD;  Location: [facility];  Service: General;  Laterality: Left;  . BIOPSY  11/26/2018   Procedure: BIOPSY;  Surgeon: Daneil Dolin, MD;  Location: [facility];  Service: Endoscopy;;  . CHOLECYSTECTOMY N/A 05/18/2019   Procedure: LAPAROSCOPIC CHOLECYSTECTOMY;  Surgeon: Aviva Signs, MD;  Location: [facility];  Service: General;  Laterality: N/A;  . CORONARY ANGIOPLASTY WITH STENT PLACEMENT     2003  . ESOPHAGOGASTRODUODENOSCOPY N/A 11/26/2018   mild erosive reflux esophagitis, gastric erythema. Negative H.pylori   . INGUINAL HERNIA REPAIR Right 04/27/2019   Procedure: HERNIA REPAIR INGUINAL ADULT;   Surgeon: Aviva Signs, MD;  Location: [facility];  Service: General;  Laterality: Right;  . KNEE SURGERY Right   . lumbar fusion with cage  07/2017  . NASAL SINUS SURGERY    . TONSILLECTOMY AND ADENOIDECTOMY       SOCIAL HISTORY:  Social History   Socioeconomic History  . Marital status: Divorced    Spouse name: Not on file  . Number of children: 2  . Years of education: Not on file  . Highest education level: Not on file  Occupational History  . 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 Smoking status: Current Every Day Smoker    Packs/day: 0.50    Years: 25.00    Pack years: 12.50    Types: Cigarettes  . Smokeless tobacco: Never Used  Substance and Sexual Activity  . Alcohol use: Yes    Comment: occ  . Drug use: Never  . Sexual activity: Yes  Other Topics Concern  . Not on file  Social History Narrative  . Not on file   Social Determinants of Health   Financial Resource Strain: Low Risk   . Difficulty of Paying Living Expenses: Not hard at all  Food Insecurity: No Food Insecurity  . Worried About Charity fundraiser in the Last Year: Never true  . Ran Out of Food in the Last Year: Never true  Transportation Needs: No Transportation Needs  . Lack of Transportation (Medical): No  . Lack of Transportation (Non-Medical): No  Physical Activity: Sufficiently Active  .  Days of Exercise per Week: 4 days  . Minutes of Exercise per Session: 60 min  Stress: No Stress Concern Present  . Feeling of Stress : Not at all  Social Connections: Slightly Isolated  . Frequency of Communication with Friends and Family: More than three times a week  . Frequency of Social Gatherings with Friends and Family: More than three times a week  . Attends Religious Services: More than 4 times per year  . Active Member of Clubs or Organizations: Yes  . Attends Archivist Meetings: 1 to 4 times per year  . Marital Status: Divorced  Human resources officer  Violence: Not At Risk  . Fear of Current or Ex-Partner: No  . Emotionally Abused: No  . Physically Abused: No  . Sexually Abused: No    FAMILY HISTORY:  Family History  Problem Relation Age of Onset  . Colon polyps Father        thinks he may have had polyps, possibly in his 62s.   [name] Heart attack Father   . Dementia Mother   . Hypotension Mother   . Stroke Sister   . Diabetes Maternal Grandmother   . Stroke Maternal Grandfather   . Cancer Paternal Grandmother   . Congestive Heart Failure Paternal Grandfather   . Kidney cancer Daughter   . Colon cancer Neg Hx   . Pancreatic cancer Neg Hx   . Liver disease Neg Hx     CURRENT MEDICATIONS:  Outpatient Encounter Medications as of 11/10/2019  Medication Sig  . allopurinol (ZYLOPRIM) 300 MG tablet TAKE 1 TABLET BY MOUTH EVERY DAY  . aspirin EC 81 MG tablet Take 81 mg by mouth 3 (three) times a week. Pt takes M,W,F  . carvedilol (COREG) 3.125 MG tablet Take 3.125 mg by mouth 2 (two) times daily.  [name] escitalopram (LEXAPRO) 20 MG tablet Take 20 mg by mouth daily.   [name] lisinopril (PRINIVIL,ZESTRIL) 10 MG tablet Take 10 mg by mouth daily.   . meloxicam (MOBIC) 7.5 MG tablet Take 1 tablet (7.5 mg total) by mouth daily.  [name] obinutuzumab in sodium chloride 0.9 % 250 mL Inject into the vein.  . Pitavastatin Calcium (LIVALO) 4 MG TABS Take 4 mg by mouth daily.  . prasugrel (EFFIENT) 10 MG TABS tablet Take 10 mg by mouth daily.  [name] sulfamethoxazole-trimethoprim (BACTRIM DS) 800-160 MG tablet Take 1 tablet by mouth 3 (three) times a week.  . venetoclax (VENCLEXTA) 100 MG TABS Take 200 mg by mouth daily.  [name] acyclovir (ZOVIRAX) 400 MG tablet Take 1 tablet (400 mg total) by mouth 2 (two) times daily. (Patient not taking: Reported on 10/14/2019)  . baclofen (LIORESAL) 10 MG tablet Take 10 mg by mouth 2 (two) times daily as needed for muscle spasms.   . cimetidine (TAGAMET) 200 MG tablet Take 200 mg by mouth as needed.   . gabapentin (NEURONTIN) 100 MG  capsule Take 100 mg by mouth 2 (two) times daily as needed (pain).   . naproxen sodium (ALEVE) 220 MG tablet Take 220-440 mg by mouth 2 (two) times daily as needed (pain).   . nitroGLYCERIN (NITROSTAT) 0.4 MG SL tablet SMARTSIG:1 Tablet(s) Sublingual As Needed  . pantoprazole (PROTONIX) 40 MG tablet Take 40 mg by mouth as needed.    No facility-administered encounter medications on file as of 11/10/2019.    ALLERGIES:  No Known Allergies   PHYSICAL EXAM:  ECOG Performance status: 1  Vitals:   11/10/19 0902  BP: 114/78  Pulse: [name])  57  Resp: 18  Temp: (!) 96.9 F (36.1 C)  SpO2: 100%   Filed Weights   11/10/19 0902  Weight: 200 lb 8 oz (90.9 kg)    Physical Exam Vitals reviewed.  Constitutional:      Appearance: Normal appearance.  Cardiovascular:     Rate and Rhythm: Normal rate and regular rhythm.     Heart sounds: Normal heart sounds.  Pulmonary:     Effort: Pulmonary effort is normal.     Breath sounds: Normal breath sounds.  Abdominal:     General: There is no distension.     Palpations: Abdomen is soft. There is no mass.  Musculoskeletal:        General: No swelling.  Skin:    General: Skin is warm.  Neurological:     General: No focal deficit present.     Mental Status: He is alert and oriented to person, place, and time.  Psychiatric:        Mood and Affect: Mood normal.        Behavior: Behavior normal.      LABORATORY DATA:  I have reviewed the labs as listed.  CBC    Component Value Date/Time   WBC 4.7 11/10/2019 0810   RBC 4.11 (L) 11/10/2019 0810   HGB 12.4 (L) 11/10/2019 0810   HCT 38.2 (L) 11/10/2019 0810   PLT 181 11/10/2019 0810   MCV 92.9 11/10/2019 0810   MCH 30.2 11/10/2019 0810   MCHC 32.5 11/10/2019 0810   RDW 14.7 11/10/2019 0810   LYMPHSABS 1.0 11/10/2019 0810   MONOABS 0.7 11/10/2019 0810   EOSABS 0.0 11/10/2019 0810   BASOSABS 0.0 11/10/2019 0810   CMP Latest Ref Rng & Units 11/10/2019 10/27/2019 10/14/2019  Glucose 70 -  99 mg/dL 89 92 72  BUN 6 - 20 mg/dL 17 14 15   Creatinine 0.61 - 1.24 mg/dL 0.85 0.76 0.79  Sodium 135 - 145 mmol/L 140 139 138  Potassium 3.5 - 5.1 mmol/L 3.9 3.7 3.9  Chloride 98 - 111 mmol/L 106 107 103  CO2 22 - 32 mmol/L 26 24 25   Calcium 8.9 - 10.3 mg/dL 8.9 8.7(L) 9.1  Total Protein 6.5 - 8.1 g/dL 6.1(L) 6.1(L) 6.7  Total Bilirubin 0.3 - 1.2 mg/dL 0.8 0.7 0.7  Alkaline Phos 38 - 126 U/L 66 70 73  AST 15 - 41 U/L 16 16 18   ALT 0 - 44 U/L 18 17 17        DIAGNOSTIC IMAGING:  I have reviewed scans.    ASSESSMENT & PLAN:   Small lymphocytic lymphoma ([place]) 1.  Clinical stage IIIb small lymphocytic lymphoma: -6 cycles of obinutuzumab from 06/09/2019 through 10/27/2019 -Reported vague abdominal pain in the left mid quadrant, not associated with food or bowel movements.  Reports that this has improved in the last couple of weeks. -We reviewed his labs.  White count is 4.7 with normal platelet count.  Hemoglobin is 12.4.  LDH is normal. -He is continuing venetoclax 200 mg daily.  We have dose reduced it secondary to carvedilol. -I plan to schedule him for PET scan and see him back after the scan to evaluate response.  2.  CAD: -He had cardiac catheterization on 10/02/2019, found to have 100% occlusion of the right coronary artery.  Angioplasty and stenting of the circumflex done.  He is on Effient and Livalo.  3.  TLS prophylaxis: -Uric acid today is 4.5.  Other electrolytes are normal.  He will continue allopurinol  300 mg daily.  4.  Musculoskeletal pains: -He developed joint pains in the knees, elbows and shoulders since venetoclax.  He will continue Mobic 7.5 mg daily.  5.  Infection prophylaxis: -He could not tolerate acyclovir.  He will continue Bactrim 3 times a week.       Orders placed this encounter:  Orders Placed This Encounter  Procedures  . NM PET Image Restag (PS) Skull Base To Thigh      Derek Jack, MD [place] [phone]

## 2019-11-10 NOTE — Progress Notes (Unsigned)
No treatment today per MD.  

## 2019-11-12 MED FILL — VENCLEXTA 100 MG TABS: 100 | 30 days supply | Qty: 60 | Fill #1

## 2019-11-18 ENCOUNTER — Other Ambulatory Visit (HOSPITAL_COMMUNITY): Payer: Self-pay | Admitting: Nurse Practitioner

## 2019-11-20 ENCOUNTER — Other Ambulatory Visit (HOSPITAL_COMMUNITY): Payer: Self-pay | Admitting: Hematology

## 2019-11-20 DIAGNOSIS — C8300 Small cell B-cell lymphoma, unspecified site: Secondary | ICD-10-CM

## 2019-11-23 ENCOUNTER — Other Ambulatory Visit: Payer: Self-pay

## 2019-11-23 ENCOUNTER — Ambulatory Visit (HOSPITAL_COMMUNITY)
Admission: RE | Admit: 2019-11-23 | Discharge: 2019-11-23 | Disposition: A | Discharge status: Home / Self Care | Payer: 59 | Source: Ambulatory Visit | Attending: Hematology | Admitting: Hematology

## 2019-11-23 DIAGNOSIS — C8300 Small cell B-cell lymphoma, unspecified site: Secondary | ICD-10-CM | POA: Insufficient documentation

## 2019-11-23 MED ORDER — FLUDEOXYGLUCOSE F - 18 (FDG) INJECTION
11.8500 | Freq: Once | INTRAVENOUS | Status: AC | PRN
  Administered 2019-11-23: 11.85 via INTRAVENOUS

## 2019-11-25 ENCOUNTER — Encounter (HOSPITAL_COMMUNITY): Payer: Self-pay | Admitting: Hematology

## 2019-11-25 ENCOUNTER — Inpatient Hospital Stay (HOSPITAL_BASED_OUTPATIENT_CLINIC_OR_DEPARTMENT_OTHER): Payer: 59 | Admitting: Hematology

## 2019-11-25 ENCOUNTER — Other Ambulatory Visit: Payer: Self-pay

## 2019-11-25 VITALS — BP 115/74 | HR 73 | Temp 96.9°F | Resp 18 | Wt 201.5 lb

## 2019-11-25 DIAGNOSIS — C8300 Small cell B-cell lymphoma, unspecified site: Secondary | ICD-10-CM

## 2019-11-25 DIAGNOSIS — C9110 Chronic lymphocytic leukemia of B-cell type not having achieved remission: Secondary | ICD-10-CM | POA: Diagnosis not present

## 2019-11-25 NOTE — Progress Notes (Signed)
[place], [place] [address]   CLINIC:  Medical Oncology/Hematology  PCP:  Orpah Greek, MD [address] [place] [address] [phone]   REASON FOR VISIT:  Small lymphocytic lymphoma.  CURRENT THERAPY: Venetoclax  INTERVAL HISTORY:  Mr. Kerwick 59 y.o. male seen for follow-up of CLL and toxicity assessment.  He is taking venetoclax 200 mg daily.  He reported pain in the right buttock which started 2 weeks ago.  He is taking Mobic 7.5 mg daily.  He is also taking Aleve every other day.  Reports appetite and energy levels of 50%.  He is also doing cardiac rehab.  REVIEW OF SYSTEMS:  Review of Systems  Musculoskeletal: Positive for arthralgias.       Right buttock pain.  All other systems reviewed and are negative.    PAST MEDICAL/SURGICAL HISTORY:  Past Medical History:  Diagnosis Date  . CAD (coronary artery disease)   . GERD (gastroesophageal reflux disease)   . HTN (hypertension)   . Hypercholesterolemia   . MI (myocardial infarction) ([place]) 2003   Past Surgical History:  Procedure Laterality Date  . AXILLARY LYMPH NODE BIOPSY Left 04/27/2019   Procedure: AXILLARY LYMPH NODE BIOPSY;  Surgeon: Aviva Signs, MD;  Location: [facility];  Service: General;  Laterality: Left;  . BIOPSY  11/26/2018   Procedure: BIOPSY;  Surgeon: Daneil Dolin, MD;  Location[facility];  Service: Endoscopy;;  . CHOLECYSTECTOMY N/A 05/18/2019   Procedure: LAPAROSCOPIC CHOLECYSTECTOMY;  Surgeon: Aviva Signs, MD;  Location: [facility];  Service: General;  Laterality: N/A;  . CORONARY ANGIOPLASTY WITH STENT PLACEMENT     2003  . ESOPHAGOGASTRODUODENOSCOPY N/A 11/26/2018   mild erosive reflux esophagitis, gastric erythema. Negative H.pylori   . INGUINAL HERNIA REPAIR Right 04/27/2019   Procedure: HERNIA REPAIR INGUINAL ADULT;  Surgeon: Aviva Signs, MD;  Location: [facility];  Service: General;  Laterality: Right;  . KNEE SURGERY Right   . lumbar  fusion with cage  07/2017  . NASAL SINUS SURGERY    . TONSILLECTOMY AND ADENOIDECTOMY       SOCIAL HISTORY:  Social History   Socioeconomic History  . Marital status: Divorced    Spouse name: Not on file  . Number of children: 2  . Years of education: Not on file  . Highest education level: Not on file  Occupational History  . 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 Smoking status: Current Every Day Smoker    Packs/day: 0.50    Years: 25.00    Pack years: 12.50    Types: Cigarettes  . Smokeless tobacco: Never Used  Substance and Sexual Activity  . Alcohol use: Yes    Comment: occ  . Drug use: Never  . Sexual activity: Yes  Other Topics Concern  . Not on file  Social History Narrative  . Not on file   Social Determinants of Health   Financial Resource Strain: Low Risk   . Difficulty of Paying Living Expenses: Not hard at all  Food Insecurity: No Food Insecurity  . Worried About Charity fundraiser in the Last Year: Never true  . Ran Out of Food in the Last Year: Never true  Transportation Needs: No Transportation Needs  . Lack of Transportation (Medical): No  . Lack of Transportation (Non-Medical): No  Physical Activity: Sufficiently Active  . Days of Exercise per Week: 4 days  .  Minutes of Exercise per Session: 60 min  Stress: No Stress Concern Present  . Feeling of Stress : Not at all  Social Connections: Slightly Isolated  . Frequency of Communication with Friends and Family: More than three times a week  . Frequency of Social Gatherings with Friends and Family: More than three times a week  . Attends Religious Services: More than 4 times per year  . Active Member of Clubs or Organizations: Yes  . Attends Archivist Meetings: 1 to 4 times per year  . Marital Status: Divorced  Human resources officer Violence: Not At Risk  . Fear of Current or Ex-Partner: No  . Emotionally Abused: No  . Physically Abused: No  .  Sexually Abused: No    FAMILY HISTORY:  Family History  Problem Relation Age of Onset  . Colon polyps Father        thinks he may have had polyps, possibly in his 68s.   [name] Heart attack Father   . Dementia Mother   . Hypotension Mother   . Stroke Sister   . Diabetes Maternal Grandmother   . Stroke Maternal Grandfather   . Cancer Paternal Grandmother   . Congestive Heart Failure Paternal Grandfather   . Kidney cancer Daughter   . Colon cancer Neg Hx   . Pancreatic cancer Neg Hx   . Liver disease Neg Hx     CURRENT MEDICATIONS:  Outpatient Encounter Medications as of 11/25/2019  Medication Sig  . allopurinol (ZYLOPRIM) 300 MG tablet TAKE 1 TABLET BY MOUTH EVERY DAY  . aspirin EC 81 MG tablet Take 81 mg by mouth 3 (three) times a week. Pt takes M,W,F  . carvedilol (COREG) 3.125 MG tablet Take 3.125 mg by mouth 2 (two) times daily.  [name] escitalopram (LEXAPRO) 20 MG tablet Take 20 mg by mouth daily.   [name] lisinopril (PRINIVIL,ZESTRIL) 10 MG tablet Take 10 mg by mouth daily.   . meloxicam (MOBIC) 7.5 MG tablet Take 1 tablet (7.5 mg total) by mouth daily.  [name] obinutuzumab in sodium chloride 0.9 % 250 mL Inject into the vein.  . Pitavastatin Calcium (LIVALO) 4 MG TABS Take 4 mg by mouth daily.  . prasugrel (EFFIENT) 10 MG TABS tablet Take 10 mg by mouth daily.  [name] sulfamethoxazole-trimethoprim (BACTRIM DS) 800-160 MG tablet TAKE 1 TABLET BY MOUTH THREE TIMES A WEEK  . venetoclax (VENCLEXTA) 100 MG TABS Take 200 mg by mouth daily.  [name] acyclovir (ZOVIRAX) 400 MG tablet Take 1 tablet (400 mg total) by mouth 2 (two) times daily. (Patient not taking: Reported on 10/14/2019)  . baclofen (LIORESAL) 10 MG tablet Take 10 mg by mouth 2 (two) times daily as needed for muscle spasms.   . cimetidine (TAGAMET) 200 MG tablet Take 200 mg by mouth as needed.   . gabapentin (NEURONTIN) 100 MG capsule Take 100 mg by mouth 2 (two) times daily as needed (pain).   . naproxen sodium (ALEVE) 220 MG tablet Take 220-440  mg by mouth 2 (two) times daily as needed (pain).   . nitroGLYCERIN (NITROSTAT) 0.4 MG SL tablet SMARTSIG:1 Tablet(s) Sublingual As Needed  . pantoprazole (PROTONIX) 40 MG tablet Take 40 mg by mouth as needed.    No facility-administered encounter medications on file as of 11/25/2019.    ALLERGIES:  No Known Allergies   PHYSICAL EXAM:  ECOG Performance status: 1  Vitals:   11/25/19 1447  BP: 115/74  Pulse: 73  Resp: 18  Temp: (!) 96.9 F (36.1  C)  SpO2: 99%   Filed Weights   11/25/19 1447  Weight: 201 lb 8 oz (91.4 kg)    Physical Exam Vitals reviewed.  Constitutional:      Appearance: Normal appearance.  Cardiovascular:     Rate and Rhythm: Normal rate and regular rhythm.     Heart sounds: Normal heart sounds.  Pulmonary:     Effort: Pulmonary effort is normal.     Breath sounds: Normal breath sounds.  Abdominal:     General: There is no distension.     Palpations: Abdomen is soft. There is no mass.  Musculoskeletal:        General: No swelling.  Skin:    General: Skin is warm.  Neurological:     General: No focal deficit present.     Mental Status: He is alert and oriented to person, place, and time.  Psychiatric:        Mood and Affect: Mood normal.        Behavior: Behavior normal.      LABORATORY DATA:  I have reviewed the labs as listed.  CBC    Component Value Date/Time   WBC 4.7 11/10/2019 0810   RBC 4.11 (L) 11/10/2019 0810   HGB 12.4 (L) 11/10/2019 0810   HCT 38.2 (L) 11/10/2019 0810   PLT 181 11/10/2019 0810   MCV 92.9 11/10/2019 0810   MCH 30.2 11/10/2019 0810   MCHC 32.5 11/10/2019 0810   RDW 14.7 11/10/2019 0810   LYMPHSABS 1.0 11/10/2019 0810   MONOABS 0.7 11/10/2019 0810   EOSABS 0.0 11/10/2019 0810   BASOSABS 0.0 11/10/2019 0810   CMP Latest Ref Rng & Units 11/10/2019 10/27/2019 10/14/2019  Glucose 70 - 99 mg/dL 89 92 72  BUN 6 - 20 mg/dL 17 14 15   Creatinine 0.61 - 1.24 mg/dL 0.85 0.76 0.79  Sodium 135 - 145 mmol/L 140 139  138  Potassium 3.5 - 5.1 mmol/L 3.9 3.7 3.9  Chloride 98 - 111 mmol/L 106 107 103  CO2 22 - 32 mmol/L 26 24 25   Calcium 8.9 - 10.3 mg/dL 8.9 8.7(L) 9.1  Total Protein 6.5 - 8.1 g/dL 6.1(L) 6.1(L) 6.7  Total Bilirubin 0.3 - 1.2 mg/dL 0.8 0.7 0.7  Alkaline Phos 38 - 126 U/L 66 70 73  AST 15 - 41 U/L 16 16 18   ALT 0 - 44 U/L 18 17 17        DIAGNOSTIC IMAGING:  I have reviewed PET scan and discussed with the patient.    ASSESSMENT & PLAN:   Small lymphocytic lymphoma ([place]) 1.  Clinical stage IIIb small lymphocytic lymphoma: -6 cycles of obinutuzumab and venetoclax from 06/09/2019 through 10/27/2019. -He is tolerating venetoclax 200 mg daily. -Reported pain in the right buttock region, worse in the last couple of weeks.  He does not report any injury.  He reports that his pain started in March but got better. -I reviewed PET scan results from 11/23/2019.  Complete metabolic response to therapy of lymphoma.  Slight hypermetabolism in the right inguinal region from hernia surgery. -There is new hypermetabolic and soft tissue fullness about the right ischial tuberosity, possibly related to hamstring origin injury. -I have advised him to use cold compresses.  We will give him hydrocodone 5/325 to be taken as needed. -He will call us back if the pain does not get better.  We will refer to orthopedics.  I will reevaluate him in 4 weeks.  2.  CAD: -He had cardiac catheterization on  10/01/2019, found to have 100% occlusion of the RCA.  Angioplasty and stenting of the circumflex was done.  He is on Effient and Livalo.  3.  Musculoskeletal pains: -He developed joint pains in the knees, elbows, shoulders since venetoclax.  He will continue Mobic 7.5 mg daily.  4.  Infection prophylaxis: -He could not tolerate acyclovir.  He will continue Bactrim 3 times a week.     Orders placed this encounter:  Orders Placed This Encounter  Procedures  . CBC with Differential/Platelet  . Comprehensive  metabolic panel  . Lactate dehydrogenase  . Magnesium  . Phosphorus  . Uric acid      Derek Jack, MD [place] [phone]

## 2019-11-25 NOTE — Patient Instructions (Addendum)
[place] at [facility] Discharge Instructions  You were seen today by Dr. Delton Coombes. He went over your recent lab results. Try rest and ice to see if that helps with the pain in your buttocks. He will send in a new prescription for pain medication. He will see you back in 1 month for labs and follow up.   Thank you for choosing [place] at [facility] to provide your oncology and hematology care.  To afford each patient quality time with our provider, please arrive at least 15 minutes before your scheduled appointment time.   If you have a lab appointment with the [facility] please come in thru the  Main Entrance and check in at the main information desk  You need to re-schedule your appointment should you arrive 10 or more minutes late.  We strive to give you quality time with our providers, and arriving late affects you and other patients whose appointments are after yours.  Also, if you no show three or more times for appointments you may be dismissed from the clinic at the providers discretion.     Again, thank you for choosing [facility].  Our hope is that these requests will decrease the amount of time that you wait before being seen by our physicians.       _____________________________________________________________  Should you have questions after your visit to [facility], please contact our office at [phone] between the hours of 8:00 a.m. and 4:30 p.m.  Voicemails left after 4:00 p.m. will not be returned until the following business day.  For prescription refill requests, have your pharmacy contact our office and allow 72 hours.    Cancer Center Support Programs:   > Cancer Support Group  2nd Tuesday of the month 1pm-2pm, Journey Room

## 2019-11-25 NOTE — Assessment & Plan Note (Signed)
1.  Clinical stage IIIb small lymphocytic lymphoma: -6 cycles of obinutuzumab and venetoclax from 06/09/2019 through 10/27/2019. -He is tolerating venetoclax 200 mg daily. -Reported pain in the right buttock region, worse in the last couple of weeks.  He does not report any injury.  He reports that his pain started in March but got better. -I reviewed PET scan results from 11/23/2019.  Complete metabolic response to therapy of lymphoma.  Slight hypermetabolism in the right inguinal region from hernia surgery. -There is new hypermetabolic and soft tissue fullness about the right ischial tuberosity, possibly related to hamstring origin injury. -I have advised him to use cold compresses.  We will give him hydrocodone 5/325 to be taken as needed. -He will call us back if the pain does not get better.  We will refer to orthopedics.  I will reevaluate him in 4 weeks.  2.  CAD: -He had cardiac catheterization on 10/01/2019, found to have 100% occlusion of the RCA.  Angioplasty and stenting of the circumflex was done.  He is on Effient and Livalo.  3.  Musculoskeletal pains: -He developed joint pains in the knees, elbows, shoulders since venetoclax.  He will continue Mobic 7.5 mg daily.  4.  Infection prophylaxis: -He could not tolerate acyclovir.  He will continue Bactrim 3 times a week.

## 2019-11-29 ENCOUNTER — Other Ambulatory Visit (HOSPITAL_COMMUNITY): Payer: Self-pay | Admitting: Hematology

## 2019-11-29 DIAGNOSIS — C8300 Small cell B-cell lymphoma, unspecified site: Secondary | ICD-10-CM

## 2019-12-08 ENCOUNTER — Other Ambulatory Visit (HOSPITAL_COMMUNITY): Payer: Self-pay | Admitting: Hematology

## 2019-12-08 DIAGNOSIS — C8300 Small cell B-cell lymphoma, unspecified site: Secondary | ICD-10-CM

## 2019-12-15 ENCOUNTER — Other Ambulatory Visit (HOSPITAL_COMMUNITY): Payer: Self-pay | Admitting: Hematology

## 2019-12-15 DIAGNOSIS — C8300 Small cell B-cell lymphoma, unspecified site: Secondary | ICD-10-CM

## 2019-12-15 MED FILL — VENCLEXTA 100 MG TABS: 100 | 30 days supply | Qty: 60 | Fill #0

## 2019-12-30 ENCOUNTER — Inpatient Hospital Stay (HOSPITAL_COMMUNITY): Payer: 59 | Attending: Hematology | Admitting: Hematology

## 2019-12-30 ENCOUNTER — Inpatient Hospital Stay (HOSPITAL_COMMUNITY): Payer: 59

## 2019-12-30 ENCOUNTER — Other Ambulatory Visit: Payer: Self-pay

## 2019-12-30 VITALS — BP 116/83 | HR 56 | Temp 96.9°F | Resp 18 | Wt 203.0 lb

## 2019-12-30 DIAGNOSIS — C8300 Small cell B-cell lymphoma, unspecified site: Secondary | ICD-10-CM | POA: Diagnosis not present

## 2019-12-30 DIAGNOSIS — F17210 Nicotine dependence, cigarettes, uncomplicated: Secondary | ICD-10-CM | POA: Diagnosis not present

## 2019-12-30 DIAGNOSIS — E7800 Pure hypercholesterolemia, unspecified: Secondary | ICD-10-CM | POA: Diagnosis not present

## 2019-12-30 DIAGNOSIS — Z7982 Long term (current) use of aspirin: Secondary | ICD-10-CM | POA: Diagnosis not present

## 2019-12-30 DIAGNOSIS — S76312A Strain of muscle, fascia and tendon of the posterior muscle group at thigh level, left thigh, initial encounter: Secondary | ICD-10-CM | POA: Insufficient documentation

## 2019-12-30 DIAGNOSIS — M25519 Pain in unspecified shoulder: Secondary | ICD-10-CM | POA: Diagnosis not present

## 2019-12-30 DIAGNOSIS — Z9221 Personal history of antineoplastic chemotherapy: Secondary | ICD-10-CM | POA: Insufficient documentation

## 2019-12-30 DIAGNOSIS — M25551 Pain in right hip: Secondary | ICD-10-CM | POA: Insufficient documentation

## 2019-12-30 DIAGNOSIS — I252 Old myocardial infarction: Secondary | ICD-10-CM | POA: Insufficient documentation

## 2019-12-30 DIAGNOSIS — Z79899 Other long term (current) drug therapy: Secondary | ICD-10-CM | POA: Insufficient documentation

## 2019-12-30 DIAGNOSIS — Z791 Long term (current) use of non-steroidal anti-inflammatories (NSAID): Secondary | ICD-10-CM | POA: Diagnosis not present

## 2019-12-30 DIAGNOSIS — I2510 Atherosclerotic heart disease of native coronary artery without angina pectoris: Secondary | ICD-10-CM | POA: Insufficient documentation

## 2019-12-30 DIAGNOSIS — I10 Essential (primary) hypertension: Secondary | ICD-10-CM | POA: Insufficient documentation

## 2019-12-30 DIAGNOSIS — Y92009 Unspecified place in unspecified non-institutional (private) residence as the place of occurrence of the external cause: Secondary | ICD-10-CM | POA: Diagnosis not present

## 2019-12-30 DIAGNOSIS — C8304 Small cell B-cell lymphoma, lymph nodes of axilla and upper limb: Secondary | ICD-10-CM | POA: Insufficient documentation

## 2019-12-30 LAB — PHOSPHORUS: Phosphorus: 3.3 mg/dL (ref 2.5–4.6)

## 2019-12-30 LAB — COMPREHENSIVE METABOLIC PANEL
ALT: 23 U/L (ref 0–44)
AST: 21 U/L (ref 15–41)
Albumin: 3.9 g/dL (ref 3.5–5.0)
Alkaline Phosphatase: 73 U/L (ref 38–126)
Anion gap: 9 (ref 5–15)
BUN: 15 mg/dL (ref 6–20)
CO2: 25 mmol/L (ref 22–32)
Calcium: 8.8 mg/dL — ABNORMAL LOW (ref 8.9–10.3)
Chloride: 104 mmol/L (ref 98–111)
Creatinine, Ser: 0.85 mg/dL (ref 0.61–1.24)
GFR calc Af Amer: >60 mL/min (ref >60)
GFR calc non Af Amer: >60 mL/min (ref >60)
Glucose, Bld: 91 mg/dL (ref 70–99)
Potassium: 3.9 mmol/L (ref 3.5–5.1)
Sodium: 138 mmol/L (ref 135–145)
Total Bilirubin: 0.6 mg/dL (ref 0.3–1.2)
Total Protein: 6.6 g/dL (ref 6.5–8.1)

## 2019-12-30 LAB — CBC WITH DIFFERENTIAL/PLATELET
Abs Immature Granulocytes: 0.02 10*3/uL (ref 0.00–0.07)
Basophils Absolute: 0 10*3/uL (ref 0.0–0.1)
Basophils Relative: 0 %
Eosinophils Absolute: 0 10*3/uL (ref 0.0–0.5)
Eosinophils Relative: 0 %
HCT: 39 % (ref 39.0–52.0)
Hemoglobin: 12.8 g/dL — ABNORMAL LOW (ref 13.0–17.0)
Immature Granulocytes: 0 %
Lymphocytes Relative: 25 %
Lymphs Abs: 1.4 10*3/uL (ref 0.7–4.0)
MCH: 30.5 pg (ref 26.0–34.0)
MCHC: 32.8 g/dL (ref 30.0–36.0)
MCV: 92.9 fL (ref 80.0–100.0)
Monocytes Absolute: 0.7 10*3/uL (ref 0.1–1.0)
Monocytes Relative: 12 %
Neutro Abs: 3.4 10*3/uL (ref 1.7–7.7)
Neutrophils Relative %: 63 %
Platelets: 179 10*3/uL (ref 150–400)
RBC: 4.2 MIL/uL — ABNORMAL LOW (ref 4.22–5.81)
RDW: 14 % (ref 11.5–15.5)
WBC: 5.5 10*3/uL (ref 4.0–10.5)
nRBC: 0 % (ref 0.0–0.2)

## 2019-12-30 LAB — MAGNESIUM: Magnesium: 2 mg/dL (ref 1.7–2.4)

## 2019-12-30 LAB — URIC ACID: Uric Acid, Serum: 3.9 mg/dL (ref 3.7–8.6)

## 2019-12-30 LAB — LACTATE DEHYDROGENASE: LDH: 154 U/L (ref 98–192)

## 2019-12-30 NOTE — Progress Notes (Signed)
Patrick Rubio, [place] Rubio [id number]   CLINIC:  Medical Oncology/Hematology  PCP:  Patrick Greek, MD [address], [address] / Patrick [place] [id number]  [phone]  REASON FOR VISIT:  Follow-up for small lymphocytic lymphoma  CURRENT THERAPY: Venetoclax  INTERVAL HISTORY:  [name], a 59 y.o. male, returns for routine follow-up for his small lymphocytic lymphoma. Patrick Rubio was last seen on 11/25/2019.  Denies any fevers or night sweats.  No weight loss.  Continue venetoclax 200 mg daily.  He reports having R hip pain for the last 2-3 months, which is stable with Mobic and ibuprofen.   REVIEW OF SYSTEMS:  Review of Systems  Constitutional: Positive for appetite change (Moderately decreased) and fatigue (Moderate).  Gastrointestinal: Positive for diarrhea. Negative for nausea and vomiting.  Musculoskeletal: Positive for arthralgias (R hip pain 7/10).  Neurological: Positive for numbness (Fingers).  All other systems reviewed and are negative.   PAST MEDICAL/SURGICAL HISTORY:  Past Medical History:  Diagnosis Date  . CAD (coronary artery disease)   . GERD (gastroesophageal reflux disease)   . HTN (hypertension)   . Hypercholesterolemia   . MI (myocardial infarction) (Patrick Rubio) 2003   Past Surgical History:  Procedure Laterality Date  . AXILLARY LYMPH NODE BIOPSY Left 04/27/2019   Procedure: AXILLARY LYMPH NODE BIOPSY;  Surgeon: Patrick Signs, MD;  Location: [facility];  Service: General;  Laterality: Left;  . BIOPSY  11/26/2018   Procedure: BIOPSY;  Surgeon: Daneil Dolin, MD;  Location: [facility];  Service: Endoscopy;;  . CHOLECYSTECTOMY N/A 05/18/2019   Procedure: LAPAROSCOPIC CHOLECYSTECTOMY;  Surgeon: Patrick Signs, MD;  Location: [facility];  Service: General;  Laterality: N/A;  . CORONARY ANGIOPLASTY WITH STENT PLACEMENT     2003  . ESOPHAGOGASTRODUODENOSCOPY N/A 11/26/2018   mild erosive reflux esophagitis, gastric erythema. Negative  H.pylori   . INGUINAL HERNIA REPAIR Right 04/27/2019   Procedure: HERNIA REPAIR INGUINAL ADULT;  Surgeon: Patrick Signs, MD;  Location: [facility];  Service: General;  Laterality: Right;  . KNEE SURGERY Right   . lumbar fusion with cage  07/2017  . NASAL SINUS SURGERY    . TONSILLECTOMY AND ADENOIDECTOMY      SOCIAL HISTORY:  Social History   Socioeconomic History  . Marital status: Divorced    Spouse name: Not on file  . Number of children: 2  . Years of education: Not on file  . Highest education level: Not on file  Occupational History  . 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 Smoking status: Current Every Day Smoker    Packs/day: 0.50    Years: 25.00    Pack years: 12.50    Types: Cigarettes  . Smokeless tobacco: Never Used  Substance and Sexual Activity  . Alcohol use: Yes    Comment: occ  . Drug use: Never  . Sexual activity: Yes  Other Topics Concern  . Not on file  Social History Narrative  . Not on file   Social Determinants of Health   Financial Resource Strain: Low Risk   . Difficulty of Paying Living Expenses: Not hard at all  Food Insecurity: No Food Insecurity  . Worried About Charity fundraiser in the Last Year: Never true  . Ran Out of Food in the Last Year: Never true  Transportation Needs: No Transportation Needs  . Lack of Transportation (Medical): No  . Lack of Transportation (Non-Medical): No  Physical Activity: Sufficiently Active  . Days of Exercise per Week: 4 days  . Minutes of Exercise per Session: 60 min  Stress: No Stress Concern Present  . Feeling of Stress : Not at all  Social Connections: Slightly Isolated  . Frequency of Communication with Friends and Family: More than three times a week  . Frequency of Social Gatherings with Friends and Family: More than three times a week  . Attends Religious Services: More than 4 times per year  . Active Member of Clubs or Organizations: Yes  . Attends Theatre manager Meetings: 1 to 4 times per year  . Marital Status: Divorced  Human resources officer Violence: Not At Risk  . Fear of Current or Ex-Partner: No  . Emotionally Abused: No  . Physically Abused: No  . Sexually Abused: No    FAMILY HISTORY:  Family History  Problem Relation Age of Onset  . Colon polyps Father        thinks he may have had polyps, possibly in his 44s.   [name] Heart attack Father   . Dementia Mother   . Hypotension Mother   . Stroke Sister   . Diabetes Maternal Grandmother   . Stroke Maternal Grandfather   . Cancer Paternal Grandmother   . Congestive Heart Failure Paternal Grandfather   . Kidney cancer Daughter   . Colon cancer Neg Hx   . Pancreatic cancer Neg Hx   . Liver disease Neg Hx     CURRENT MEDICATIONS:  Current Outpatient Medications  Medication Sig Dispense Refill  . acyclovir (ZOVIRAX) 400 MG tablet TAKE 1 TABLET BY MOUTH EVERY DAY 90 tablet 1  . allopurinol (ZYLOPRIM) 300 MG tablet TAKE 1 TABLET BY MOUTH EVERY DAY 90 tablet 2  . aspirin EC 81 MG tablet Take 81 mg by mouth 3 (three) times a week. Pt takes M,W,F    . carvedilol (COREG) 3.125 MG tablet Take 3.125 mg by mouth 2 (two) times daily.    [name] escitalopram (LEXAPRO) 20 MG tablet Take 20 mg by mouth daily.     [name] gabapentin (NEURONTIN) 100 MG capsule Take 100 mg by mouth 2 (two) times daily as needed (pain).     [name] lisinopril (PRINIVIL,ZESTRIL) 10 MG tablet Take 10 mg by mouth daily.     . meloxicam (MOBIC) 7.5 MG tablet TAKE 1 TABLET BY MOUTH EVERY DAY 30 tablet 2  . naproxen sodium (ALEVE) 220 MG tablet Take 220-440 mg by mouth 2 (two) times daily as needed (pain).     [name] obinutuzumab in sodium chloride 0.9 % 250 mL Inject into the vein.    . pantoprazole (PROTONIX) 40 MG tablet Take 40 mg by mouth as needed.     . Pitavastatin Calcium (LIVALO) 4 MG TABS Take 4 mg by mouth daily.    . prasugrel (EFFIENT) 10 MG TABS tablet Take 10 mg by mouth daily.    [name] sulfamethoxazole-trimethoprim (BACTRIM DS)  800-160 MG tablet TAKE 1 TABLET BY MOUTH THREE TIMES A WEEK 12 tablet 5  . VENCLEXTA 100 MG TABS TAKE 2 TABS(200 MG) BY MOUTH DAILY. 60 tablet 1  . baclofen (LIORESAL) 10 MG tablet Take 10 mg by mouth 2 (two) times daily as needed for muscle spasms.     . cimetidine (TAGAMET) 200 MG tablet Take 200 mg by mouth as needed.     . nitroGLYCERIN (NITROSTAT) 0.4 MG SL tablet SMARTSIG:1 Tablet(s) Sublingual As Needed     No current facility-administered medications for  this visit.    ALLERGIES:  No Known Allergies  PHYSICAL EXAM:  Performance status (ECOG): 1 - Symptomatic but completely ambulatory  Vitals:   12/30/19 1449  BP: 116/83  Pulse: (!) 56  Resp: 18  Temp: (!) 96.9 F (36.1 C)  SpO2: 97%   Wt Readings from Last 3 Encounters:  12/30/19 203 lb (92.1 kg)  11/25/19 201 lb 8 oz (91.4 kg)  11/10/19 200 lb 8 oz (90.9 kg)   Physical Exam Vitals reviewed.  Constitutional:      Appearance: Normal appearance.  Cardiovascular:     Rate and Rhythm: Normal rate and regular rhythm.  Pulmonary:     Effort: Pulmonary effort is normal.     Breath sounds: Normal breath sounds.  Abdominal:     Palpations: Abdomen is soft. There is no mass.     Tenderness: There is no abdominal tenderness.  Musculoskeletal:     Right lower leg: Bony tenderness (Over posterior R hip) present. No edema.     Left lower leg: No edema.  Neurological:     General: No focal deficit present.     Mental Status: He is alert and oriented to person, place, and time.  Psychiatric:        Mood and Affect: Mood normal.        Behavior: Behavior normal.     LABORATORY DATA:  I have reviewed the labs as listed.  CBC Latest Ref Rng & Units 12/30/2019 11/10/2019 10/27/2019  WBC 4.0 - 10.5 K/uL 5.5 4.7 5.6  Hemoglobin 13.0 - 17.0 g/dL 12.8(L) 12.4(L) 12.8(L)  Hematocrit 39.0 - 52.0 % 39.0 38.2(L) 39.1  Platelets 150 - 400 K/uL 179 181 203   CMP Latest Ref Rng & Units 12/30/2019 11/10/2019 10/27/2019  Glucose 70 - 99  mg/dL 91 89 92  BUN 6 - 20 mg/dL 15 17 14   Creatinine 0.61 - 1.24 mg/dL 0.85 0.85 0.76  Sodium 135 - 145 mmol/L 138 140 139  Potassium 3.5 - 5.1 mmol/L 3.9 3.9 3.7  Chloride 98 - 111 mmol/L 104 106 107  CO2 22 - 32 mmol/L 25 26 24   Calcium 8.9 - 10.3 mg/dL 8.8(L) 8.9 8.7(L)  Total Protein 6.5 - 8.1 g/dL 6.6 6.1(L) 6.1(L)  Total Bilirubin 0.3 - 1.2 mg/dL 0.6 0.8 0.7  Alkaline Phos 38 - 126 U/L 73 66 70  AST 15 - 41 U/L 21 16 16   ALT 0 - 44 U/L 23 18 17       Component Value Date/Time   RBC 4.20 (L) 12/30/2019 1329   MCV 92.9 12/30/2019 1329   MCH 30.5 12/30/2019 1329   MCHC 32.8 12/30/2019 1329   RDW 14.0 12/30/2019 1329   LYMPHSABS 1.4 12/30/2019 1329   MONOABS 0.7 12/30/2019 1329   EOSABS 0.0 12/30/2019 1329   BASOSABS 0.0 12/30/2019 1329    DIAGNOSTIC IMAGING:  I have independently reviewed the scans and discussed with the patient.   ASSESSMENT:  1.  Clinical stage IIIb small lymphocytic lymphoma: -6 cycles of obinutuzumab and venetoclax from 06/09/2019 through 10/27/2019. -PET scan on 11/23/2019 showed complete metabolic response to therapy of lymphoma.  Slight hypermetabolism in the right inguinal region from hernia surgery.  New hypermetabolic soft tissue fullness about the ischial tuberosity, possibly related to hamstring origin injury.  2.  CAD: -Cardiac catheterization on 10/01/2019 found to have 100% occlusion of the RCA. -Angioplasty and stenting of the circumflex was done. -He was started on Effient and Livalo.   PLAN:  1.  Clinical stage IIIb small lymphocytic lymphoma: -I reviewed CBC from today which was within normal limits.  LFTs are normal.  LDH was normal. -Physical exam did not reveal any palpable adenopathy. -We will plan to see him back in 6 weeks for follow-up with repeat labs.  2.  Right posterior hip pain: -There is tender area at the right ischial tuberosity.  This has not gotten any better since March. -He is taking ibuprofen 2 tablets in the  morning. -I have called and talked to Dr. Thornton Papas.  We will order MRI of the pelvis without contrast. -We will set up a phone appointment.  3. Musculoskeletal pains: -He developed joint pains in the knees, elbows, shoulders since venetoclax. -He will continue Mobic 7.5 mg daily.  4.  ID prophylaxis: -He could not tolerate acyclovir. -He will continue Bactrim 3 times a week.   Orders placed this encounter:  No orders of the defined types were placed in this encounter.    Derek Jack, MD [facility] [phone]   I, Jacqualyn Posey, am acting as a scribe for Dr. Sanda Linger.  I, Derek Jack MD, have reviewed the above documentation for accuracy and completeness, and I agree with the above.

## 2019-12-30 NOTE — Patient Instructions (Signed)
[place] at [facility] Discharge Instructions  You were seen today by Dr. Delton Coombes. He went over your recent results. You will get a referral to get an MRI of the pelvis. Dr. Delton Coombes will see you back in 6 weeks for labs and follow up.   Thank you for choosing [place] at [facility] to provide your oncology and hematology care.  To afford each patient quality time with our provider, please arrive at least 15 minutes before your scheduled appointment time.   If you have a lab appointment with the [facility] please come in thru the  Main Entrance and check in at the main information desk  You need to re-schedule your appointment should you arrive 10 or more minutes late.  We strive to give you quality time with our providers, and arriving late affects you and other patients whose appointments are after yours.  Also, if you no show three or more times for appointments you may be dismissed from the clinic at the providers discretion.     Again, thank you for choosing [facility].  Our hope is that these requests will decrease the amount of time that you wait before being seen by our physicians.       _____________________________________________________________  Should you have questions after your visit to [facility], please contact our office at [phone] between the hours of 8:00 a.m. and 4:30 p.m.  Voicemails left after 4:00 p.m. will not be returned until the following business day.  For prescription refill requests, have your pharmacy contact our office and allow 72 hours.    Cancer Center Support Programs:   > Cancer Support Group  2nd Tuesday of the month 1pm-2pm, Journey Room

## 2020-01-10 ENCOUNTER — Ambulatory Visit (HOSPITAL_COMMUNITY)
Admission: RE | Admit: 2020-01-10 | Discharge: 2020-01-10 | Disposition: A | Discharge status: Home / Self Care | Payer: 59 | Source: Ambulatory Visit | Attending: Hematology | Admitting: Hematology

## 2020-01-10 DIAGNOSIS — M25551 Pain in right hip: Secondary | ICD-10-CM | POA: Diagnosis not present

## 2020-01-12 ENCOUNTER — Inpatient Hospital Stay (HOSPITAL_BASED_OUTPATIENT_CLINIC_OR_DEPARTMENT_OTHER): Payer: 59 | Admitting: Hematology

## 2020-01-12 DIAGNOSIS — M25551 Pain in right hip: Secondary | ICD-10-CM

## 2020-01-12 DIAGNOSIS — C8304 Small cell B-cell lymphoma, lymph nodes of axilla and upper limb: Secondary | ICD-10-CM | POA: Diagnosis not present

## 2020-01-12 DIAGNOSIS — M25519 Pain in unspecified shoulder: Secondary | ICD-10-CM

## 2020-01-12 DIAGNOSIS — S76312A Strain of muscle, fascia and tendon of the posterior muscle group at thigh level, left thigh, initial encounter: Secondary | ICD-10-CM

## 2020-01-12 DIAGNOSIS — Y92009 Unspecified place in unspecified non-institutional (private) residence as the place of occurrence of the external cause: Secondary | ICD-10-CM

## 2020-01-12 DIAGNOSIS — Z79899 Other long term (current) drug therapy: Secondary | ICD-10-CM

## 2020-01-12 DIAGNOSIS — Z9221 Personal history of antineoplastic chemotherapy: Secondary | ICD-10-CM

## 2020-01-12 DIAGNOSIS — F17210 Nicotine dependence, cigarettes, uncomplicated: Secondary | ICD-10-CM

## 2020-01-12 MED FILL — VENCLEXTA 100 MG TABS: 100 | 30 days supply | Qty: 60 | Fill #1

## 2020-01-12 NOTE — Progress Notes (Signed)
Virtual Visit via Telephone Note  I connected with Patrick Rubio on 01/12/20 at  3:45 PM EDT by telephone and verified that I am speaking with the correct person using two identifiers.   I discussed the limitations, risks, security and privacy concerns of performing an evaluation and management service by telephone and the availability of in person appointments. I also discussed with the patient that there may be a patient responsible charge related to this service. The patient expressed understanding and agreed to proceed.   History of Present Illness: Patient seen in our clinic for stage IIIb small lymphocytic lymphoma.  He was treated with 6 cycles of obinutuzumab and venetoclax from 06/09/2019 through 10/27/2019.  PET scan on 11/03/2019 showed complete metabolic response to therapy of lymphoma.   Observations/Objective: He reports that he still has pain in the right buttock region, rated as 7 out of 10 in intensity.  He had MRI done.  This pain started in April after his last treatment of obinutuzumab on 10/27/2019.  He does not report any trauma and does not recollect any physical activity that has caused it.  He noticed it when he got out of the couch to go to the bathroom.  Assessment and Plan:  1.  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  High-grade partial tear of the right semimembranosus tendon at the ischial tuberosity.  Low-grade partial tear of the left hamstring tendon origin.  No bone abnormalities. -I have recommended orthopedic follow-up.  We will make a referral to [facility] in [place].  2.  Stage IIIb small lymphocytic lymphoma: -He has an appointment in 4 weeks to see me. -He will continue venetoclax 400 mg daily. -I have reviewed  adverse effects of venetoclax, obinutuzumab, Bactrim and Effient which should not cause tendon tear.  Follow Up Instructions: RTC as previously scheduled.   I discussed the assessment and treatment plan with the patient. The patient was provided an opportunity to ask questions and all were answered. The patient agreed with the plan and demonstrated an understanding of the instructions.   The patient was advised to call back or seek an in-person evaluation if the symptoms worsen or if the condition fails to improve as anticipated.  I provided 11 minutes of non-face-to-face time during this encounter.   Derek Jack, MD

## 2020-01-14 ENCOUNTER — Encounter: Payer: Self-pay | Admitting: Internal Medicine

## 2020-02-02 ENCOUNTER — Other Ambulatory Visit (HOSPITAL_COMMUNITY): Payer: Self-pay | Admitting: Nurse Practitioner

## 2020-02-02 DIAGNOSIS — C8300 Small cell B-cell lymphoma, unspecified site: Secondary | ICD-10-CM

## 2020-02-10 MED FILL — VENCLEXTA 100 MG TABS: 100 | 30 days supply | Qty: 60 | Fill #0

## 2020-02-11 ENCOUNTER — Inpatient Hospital Stay (HOSPITAL_COMMUNITY): Payer: 59

## 2020-02-11 ENCOUNTER — Other Ambulatory Visit: Payer: Self-pay

## 2020-02-11 ENCOUNTER — Inpatient Hospital Stay (HOSPITAL_COMMUNITY): Payer: 59 | Attending: Hematology | Admitting: Hematology

## 2020-02-11 ENCOUNTER — Encounter (HOSPITAL_COMMUNITY): Payer: Self-pay | Admitting: Hematology

## 2020-02-11 VITALS — BP 119/67 | HR 60 | Temp 97.6°F | Resp 18 | Wt 207.3 lb

## 2020-02-11 DIAGNOSIS — I10 Essential (primary) hypertension: Secondary | ICD-10-CM | POA: Diagnosis not present

## 2020-02-11 DIAGNOSIS — I252 Old myocardial infarction: Secondary | ICD-10-CM | POA: Insufficient documentation

## 2020-02-11 DIAGNOSIS — F17210 Nicotine dependence, cigarettes, uncomplicated: Secondary | ICD-10-CM | POA: Insufficient documentation

## 2020-02-11 DIAGNOSIS — C8300 Small cell B-cell lymphoma, unspecified site: Secondary | ICD-10-CM | POA: Insufficient documentation

## 2020-02-11 DIAGNOSIS — Z7982 Long term (current) use of aspirin: Secondary | ICD-10-CM | POA: Diagnosis not present

## 2020-02-11 DIAGNOSIS — K219 Gastro-esophageal reflux disease without esophagitis: Secondary | ICD-10-CM | POA: Diagnosis not present

## 2020-02-11 DIAGNOSIS — I2510 Atherosclerotic heart disease of native coronary artery without angina pectoris: Secondary | ICD-10-CM | POA: Insufficient documentation

## 2020-02-11 DIAGNOSIS — Z79899 Other long term (current) drug therapy: Secondary | ICD-10-CM | POA: Insufficient documentation

## 2020-02-11 DIAGNOSIS — E7800 Pure hypercholesterolemia, unspecified: Secondary | ICD-10-CM | POA: Diagnosis not present

## 2020-02-11 LAB — CBC WITH DIFFERENTIAL/PLATELET
Abs Immature Granulocytes: 0.03 10*3/uL (ref 0.00–0.07)
Basophils Absolute: 0 10*3/uL (ref 0.0–0.1)
Basophils Relative: 0 %
Eosinophils Absolute: 0 10*3/uL (ref 0.0–0.5)
Eosinophils Relative: 0 %
HCT: 38.8 % — ABNORMAL LOW (ref 39.0–52.0)
Hemoglobin: 12.9 g/dL — ABNORMAL LOW (ref 13.0–17.0)
Immature Granulocytes: 1 %
Lymphocytes Relative: 26 %
Lymphs Abs: 1.3 10*3/uL (ref 0.7–4.0)
MCH: 30.8 pg (ref 26.0–34.0)
MCHC: 33.2 g/dL (ref 30.0–36.0)
MCV: 92.6 fL (ref 80.0–100.0)
Monocytes Absolute: 0.8 10*3/uL (ref 0.1–1.0)
Monocytes Relative: 15 %
Neutro Abs: 2.9 10*3/uL (ref 1.7–7.7)
Neutrophils Relative %: 58 %
Platelets: 181 10*3/uL (ref 150–400)
RBC: 4.19 MIL/uL — ABNORMAL LOW (ref 4.22–5.81)
RDW: 13.9 % (ref 11.5–15.5)
WBC: 5.1 10*3/uL (ref 4.0–10.5)
nRBC: 0 % (ref 0.0–0.2)

## 2020-02-11 LAB — COMPREHENSIVE METABOLIC PANEL
ALT: 16 U/L (ref 0–44)
AST: 16 U/L (ref 15–41)
Albumin: 4 g/dL (ref 3.5–5.0)
Alkaline Phosphatase: 64 U/L (ref 38–126)
Anion gap: 7 (ref 5–15)
BUN: 19 mg/dL (ref 6–20)
CO2: 23 mmol/L (ref 22–32)
Calcium: 8.8 mg/dL — ABNORMAL LOW (ref 8.9–10.3)
Chloride: 106 mmol/L (ref 98–111)
Creatinine, Ser: 1.07 mg/dL (ref 0.61–1.24)
GFR calc Af Amer: >60 mL/min (ref >60)
GFR calc non Af Amer: >60 mL/min (ref >60)
Glucose, Bld: 81 mg/dL (ref 70–99)
Potassium: 3.8 mmol/L (ref 3.5–5.1)
Sodium: 136 mmol/L (ref 135–145)
Total Bilirubin: 0.7 mg/dL (ref 0.3–1.2)
Total Protein: 6.5 g/dL (ref 6.5–8.1)

## 2020-02-11 LAB — MAGNESIUM: Magnesium: 1.9 mg/dL (ref 1.7–2.4)

## 2020-02-11 LAB — LACTATE DEHYDROGENASE: LDH: 147 U/L (ref 98–192)

## 2020-02-11 LAB — PHOSPHORUS: Phosphorus: 4.1 mg/dL (ref 2.5–4.6)

## 2020-02-11 LAB — URIC ACID: Uric Acid, Serum: 4 mg/dL (ref 3.7–8.6)

## 2020-02-11 NOTE — Progress Notes (Signed)
[facility] [place], [place] [address]   CLINIC:  Medical Oncology/Hematology  PCP:  Orpah Greek, MD [address] / [place] [address]  [phone]  REASON FOR VISIT:  Follow-up for small lymphocytic lymphoma  PRIOR THERAPY: Obinutuzumab & venetoclax x 6 cycles from 06/09/2019 to 10/27/2019  CURRENT THERAPY: Venetoclax  INTERVAL HISTORY:  [name], a 59 y.o. male, returns for routine follow-up for his small lymphocytic lymphoma. Patrick Rubio was last seen on 12/30/2019.  Today Patrick Rubio reports that his hip pain is stable and has been trying to contact the orthopedic surgeon, but Patrick Rubio has not received any calls back. Patrick Rubio reports Patrick Rubio is able to climb stairs, but it takes it a while. Patrick Rubio takes Aleve 2-3 tablets daily for the hip pain. Patrick Rubio takes venetoclax 2 tablets daily and Patrick Rubio is tolerating it well, without N/V, but Patrick Rubio gets diarrhea from Bactrim which Patrick Rubio takes M/W/F.   REVIEW OF SYSTEMS:  Review of Systems  Constitutional: Positive for appetite change (mildly decreased) and fatigue (severe).  Musculoskeletal: Positive for arthralgias (8/10 bilat hips).  Skin: Negative for rash.  All other systems reviewed and are negative.   PAST MEDICAL/SURGICAL HISTORY:  Past Medical History:  Diagnosis Date  . CAD (coronary artery disease)   . GERD (gastroesophageal reflux disease)   . HTN (hypertension)   . Hypercholesterolemia   . MI (myocardial infarction) ([facility]) 2003   Past Surgical History:  Procedure Laterality Date  . AXILLARY LYMPH NODE BIOPSY Left 04/27/2019   Procedure: AXILLARY LYMPH NODE BIOPSY;  Surgeon: Aviva Signs, MD;  Location: [facility];  Service: General;  Laterality: Left;  . BIOPSY  11/26/2018   Procedure: BIOPSY;  Surgeon: Daneil Dolin, MD;  Location: [facility] SUITE;  Service: Endoscopy;;  . CHOLECYSTECTOMY N/A 05/18/2019   Procedure: LAPAROSCOPIC CHOLECYSTECTOMY;  Surgeon: Aviva Signs, MD;  Location: [facility];  Service: General;   Laterality: N/A;  . CORONARY ANGIOPLASTY WITH STENT PLACEMENT     2003  . ESOPHAGOGASTRODUODENOSCOPY N/A 11/26/2018   mild erosive reflux esophagitis, gastric erythema. Negative H.pylori   . INGUINAL HERNIA REPAIR Right 04/27/2019   Procedure: HERNIA REPAIR INGUINAL ADULT;  Surgeon: Aviva Signs, MD;  Location: [facility];  Service: General;  Laterality: Right;  . KNEE SURGERY Right   . lumbar fusion with cage  07/2017  . NASAL SINUS SURGERY    . TONSILLECTOMY AND ADENOIDECTOMY      SOCIAL HISTORY:  Social History   Socioeconomic History  . Marital status: Divorced    Spouse name: Not on file  . Number of children: 2  . Years of education: Not on file  . Highest education level: Not on file  Occupational History  . 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 Smoking status: Current Every Day Smoker    Packs/day: 0.50    Years: 25.00    Pack years: 12.50    Types: Cigarettes  . Smokeless tobacco: Never Used  Vaping Use  . Vaping Use: Never used  Substance and Sexual Activity  . Alcohol use: Yes    Comment: occ  . Drug use: Never  . Sexual activity: Yes  Other Topics Concern  . Not on file  Social History Narrative  . Not on file   Social Determinants of Health   Financial Resource Strain: Low Risk   . Difficulty of Paying Living Expenses: Not hard at all  Food Insecurity:  No Food Insecurity  . Worried About Charity fundraiser in the Last Year: Never true  . Ran Out of Food in the Last Year: Never true  Transportation Needs: No Transportation Needs  . Lack of Transportation (Medical): No  . Lack of Transportation (Non-Medical): No  Physical Activity: Sufficiently Active  . Days of Exercise per Week: 4 days  . Minutes of Exercise per Session: 60 min  Stress: No Stress Concern Present  . Feeling of Stress : Not at all  Social Connections: Moderately Integrated  . Frequency of Communication with Friends and Family: More than three  times a week  . Frequency of Social Gatherings with Friends and Family: More than three times a week  . Attends Religious Services: More than 4 times per year  . Active Member of Clubs or Organizations: Yes  . Attends Archivist Meetings: 1 to 4 times per year  . Marital Status: Divorced  Human resources officer Violence: Not At Risk  . Fear of Current or Ex-Partner: No  . Emotionally Abused: No  . Physically Abused: No  . Sexually Abused: No    FAMILY HISTORY:  Family History  Problem Relation Age of Onset  . Colon polyps Father        thinks Patrick Rubio may have had polyps, possibly in his 81s.   [name] Heart attack Father   . Dementia Mother   . Hypotension Mother   . Stroke Sister   . Diabetes Maternal Grandmother   . Stroke Maternal Grandfather   . Cancer Paternal Grandmother   . Congestive Heart Failure Paternal Grandfather   . Kidney cancer Daughter   . Colon cancer Neg Hx   . Pancreatic cancer Neg Hx   . Liver disease Neg Hx     CURRENT MEDICATIONS:  Current Outpatient Medications  Medication Sig Dispense Refill  . acyclovir (ZOVIRAX) 400 MG tablet TAKE 1 TABLET BY MOUTH EVERY DAY 90 tablet 1  . allopurinol (ZYLOPRIM) 300 MG tablet TAKE 1 TABLET BY MOUTH EVERY DAY 90 tablet 2  . aspirin EC 81 MG tablet Take 81 mg by mouth 3 (three) times a week. Pt takes M,W,F    . baclofen (LIORESAL) 10 MG tablet Take 10 mg by mouth 2 (two) times daily as needed for muscle spasms.     . carvedilol (COREG) 3.125 MG tablet Take 3.125 mg by mouth 2 (two) times daily.    . cimetidine (TAGAMET) 200 MG tablet Take 200 mg by mouth as needed.     [name] escitalopram (LEXAPRO) 20 MG tablet Take 20 mg by mouth daily.     [name] gabapentin (NEURONTIN) 100 MG capsule Take 100 mg by mouth 2 (two) times daily as needed (pain).     [name] lisinopril (PRINIVIL,ZESTRIL) 10 MG tablet Take 10 mg by mouth daily.     . meloxicam (MOBIC) 7.5 MG tablet TAKE 1 TABLET BY MOUTH EVERY DAY 30 tablet 2  . naproxen sodium (ALEVE) 220  MG tablet Take 220-440 mg by mouth 2 (two) times daily as needed (pain).     . nitroGLYCERIN (NITROSTAT) 0.4 MG SL tablet SMARTSIG:1 Tablet(s) Sublingual As Needed (Patient not taking: Reported on 01/12/2020)    . obinutuzumab in sodium chloride 0.9 % 250 mL Inject into the vein.    . pantoprazole (PROTONIX) 40 MG tablet Take 40 mg by mouth as needed.     . Pitavastatin Calcium (LIVALO) 4 MG TABS Take 4 mg by mouth daily.    . prasugrel (  EFFIENT) 10 MG TABS tablet Take 10 mg by mouth daily.    [name] sulfamethoxazole-trimethoprim (BACTRIM DS) 800-160 MG tablet TAKE 1 TABLET BY MOUTH THREE TIMES A WEEK 12 tablet 5  . VENCLEXTA 100 MG TABS TAKE 2 TABS(200 MG) BY MOUTH DAILY. 60 tablet 1   No current facility-administered medications for this visit.    ALLERGIES:  No Known Allergies  PHYSICAL EXAM:  Performance status (ECOG): 1 - Symptomatic but completely ambulatory  Vitals:   02/11/20 1110  BP: 119/67  Pulse: 60  Resp: 18  Temp: 97.6 F (36.4 C)  SpO2: 97%   Wt Readings from Last 3 Encounters:  02/11/20 207 lb 4.8 oz (94 kg)  12/30/19 203 lb (92.1 kg)  11/25/19 201 lb 8 oz (91.4 kg)   Physical Exam Vitals reviewed.  Constitutional:      Appearance: Normal appearance.  Cardiovascular:     Rate and Rhythm: Normal rate and regular rhythm.     Pulses: Normal pulses.     Heart sounds: Normal heart sounds.  Pulmonary:     Effort: Pulmonary effort is normal.     Breath sounds: Normal breath sounds.  Abdominal:     Palpations: Abdomen is soft. There is no hepatomegaly, splenomegaly or mass.     Tenderness: There is no abdominal tenderness.     Hernia: No hernia is present.  Musculoskeletal:     Right lower leg: No edema.     Left lower leg: No edema.  Lymphadenopathy:     Upper Body:     Right upper body: No supraclavicular or axillary adenopathy.     Left upper body: No supraclavicular or axillary adenopathy.     Lower Body: No right inguinal adenopathy. No left inguinal  adenopathy.  Neurological:     General: No focal deficit present.     Mental Status: Patrick Rubio is alert and oriented to person, place, and time.  Psychiatric:        Mood and Affect: Mood normal.        Behavior: Behavior normal.     LABORATORY DATA:  I have reviewed the labs as listed.  CBC Latest Ref Rng & Units 02/11/2020 12/30/2019 11/10/2019  WBC 4.0 - 10.5 K/uL 5.1 5.5 4.7  Hemoglobin 13.0 - 17.0 g/dL 12.9(L) 12.8(L) 12.4(L)  Hematocrit 39 - 52 % 38.8(L) 39.0 38.2(L)  Platelets 150 - 400 K/uL 181 179 181   CMP Latest Ref Rng & Units 02/11/2020 12/30/2019 11/10/2019  Glucose 70 - 99 mg/dL 81 91 89  BUN 6 - 20 mg/dL 19 15 17   Creatinine 0.61 - 1.24 mg/dL 1.07 0.85 0.85  Sodium 135 - 145 mmol/L 136 138 140  Potassium 3.5 - 5.1 mmol/L 3.8 3.9 3.9  Chloride 98 - 111 mmol/L 106 104 106  CO2 22 - 32 mmol/L 23 25 26   Calcium 8.9 - 10.3 mg/dL 8.8(L) 8.8(L) 8.9  Total Protein 6.5 - 8.1 g/dL 6.5 6.6 6.1(L)  Total Bilirubin 0.3 - 1.2 mg/dL 0.7 0.6 0.8  Alkaline Phos 38 - 126 U/L 64 73 66  AST 15 - 41 U/L 16 21 16   ALT 0 - 44 U/L 16 23 18       Component Value Date/Time   RBC 4.19 (L) 02/11/2020 1049   MCV 92.6 02/11/2020 1049   MCH 30.8 02/11/2020 1049   MCHC 33.2 02/11/2020 1049   RDW 13.9 02/11/2020 1049   LYMPHSABS 1.3 02/11/2020 1049   MONOABS 0.8 02/11/2020 1049   EOSABS 0.0 02/11/2020  1049   BASOSABS 0.0 02/11/2020 1049   Lab Results  Component Value Date   LDH 147 02/11/2020   LDH 154 12/30/2019   LDH 144 11/10/2019    DIAGNOSTIC IMAGING:  I have independently reviewed the scans and discussed with the patient. No results found.   ASSESSMENT:  1. Clinical stage IIIb small lymphocytic lymphoma: -6 cycles of obinutuzumab and venetoclax from 06/09/2019 through 10/27/2019. -PET scan on 11/23/2019 showed complete metabolic response to therapy of lymphoma.  Slight hypermetabolism in the right inguinal region from hernia surgery.  New hypermetabolic soft tissue fullness about the  ischial tuberosity, possibly related to hamstring origin injury. -Patrick Rubio is currently on venetoclax 200 mg daily, dose reduced secondary to carvedilol.  2.  CAD: -Cardiac catheterization on 10/01/2019 found to have 100% occlusion of the RCA. -Angioplasty and stenting of the circumflex was done. -Patrick Rubio was started on Effient and Livalo. -Echocardiogram on 01/18/2020 by Dr. Sabra Heck in [place] LVEF 50-55% with mild global hypokinesia, moderate LVH, normal RV function, stage I diastolic dysfunction.   PLAN:  1. Clinical stage IIIb small lymphocytic lymphoma: -Patrick Rubio is tolerating venetoclax 200 mg daily. -Labs from today shows hemoglobin 12.9, white count 5.1 and platelet count 181.  Tumor lysis labs were normal.  Creatinine was 1.07. -I plan to see him back in 6 weeks for follow-up.  Patrick Rubio will continue same dose of venetoclax.  2.  Right posterior hip pain: -MRI of the pelvis without contrast on 01/10/2020 shows complete tear of the right conjoined semitendinosus and biceps femoris tendon at the ischial tuberosity with 2.2 cm retraction.  High-grade partial tear of the right semimembranosus tendon at the ischial tuberosity.  Low-grade partial tear of the left hamstring tendon origin. -Patrick Rubio was not able to see orthopedics at [facility] in [place]. -We will make a referral to orthopedics locally.  3. Musculoskeletal pains: -Patrick Rubio developed some joint pain since venetoclax was started.  Continue Mobic 7.5 mg daily.  4.  ID prophylaxis: -Continue Bactrim 3 times a week.  Could not tolerate acyclovir.  Orders placed this encounter:  No orders of the defined types were placed in this encounter.    Derek Jack, MD [place] [phone]   I, Milinda Antis, am acting as a scribe for Dr. Sanda Linger.  I, Derek Jack MD, have reviewed the above documentation for accuracy and completeness, and I agree with the above.

## 2020-02-11 NOTE — Patient Instructions (Signed)
[place] at [facility] Discharge Instructions  You were seen today by Dr. Delton Coombes. He went over your recent results. You will get a referral to an orthopedic surgeon. Dr. Delton Coombes will see you back in 6 weeks for labs and follow up.   Thank you for choosing [place] at [facility] to provide your oncology and hematology care.  To afford each patient quality time with our provider, please arrive at least 15 minutes before your scheduled appointment time.   If you have a lab appointment with the [facility] please come in thru the Main Entrance and check in at the main information desk  You need to re-schedule your appointment should you arrive 10 or more minutes late.  We strive to give you quality time with our providers, and arriving late affects you and other patients whose appointments are after yours.  Also, if you no show three or more times for appointments you may be dismissed from the clinic at the providers discretion.     Again, thank you for choosing [facility].  Our hope is that these requests will decrease the amount of time that you wait before being seen by our physicians.       _____________________________________________________________  Should you have questions after your visit to [facility], please contact our office at [phone] between the hours of 8:00 a.m. and 4:30 p.m.  Voicemails left after 4:00 p.m. will not be returned until the following business day.  For prescription refill requests, have your pharmacy contact our office and allow 72 hours.    Cancer Center Support Programs:   > Cancer Support Group  2nd Tuesday of the month 1pm-2pm, Journey Room

## 2020-02-11 NOTE — Progress Notes (Signed)
Patient is taking venetoclax and has not missed any doses and reports no side effects at this time.  

## 2020-02-16 ENCOUNTER — Telehealth: Payer: Self-pay | Admitting: Orthopedic Surgery

## 2020-02-16 NOTE — Telephone Encounter (Signed)
Need a phone number

## 2020-02-16 NOTE — Telephone Encounter (Signed)
Dr Raliegh Ip from the cancer center asks for you to call him regarding this patient please  Thanks

## 2020-02-17 NOTE — Telephone Encounter (Signed)
[phone]

## 2020-02-28 ENCOUNTER — Other Ambulatory Visit (HOSPITAL_COMMUNITY): Payer: Self-pay | Admitting: Hematology

## 2020-02-28 DIAGNOSIS — C8300 Small cell B-cell lymphoma, unspecified site: Secondary | ICD-10-CM

## 2020-03-10 MED FILL — VENCLEXTA 100 MG TABS: 100 | 30 days supply | Qty: 60 | Fill #1

## 2020-03-21 ENCOUNTER — Telehealth (HOSPITAL_COMMUNITY): Payer: Self-pay | Admitting: Surgery

## 2020-03-21 NOTE — Telephone Encounter (Signed)
Pt had left a voicemail stating that he had gotten a refill of venetoclax on Friday and that by Saturday he was so tired he could hardly move.  Pt did not take the venetoclax on Sunday and is feeling slightly better today, but he is still concerned about the fatigue.  Per Dr. Delton Coombes, if the pt is feeling better he can resume the venetoclax.  Also, Dr. Delton Coombes said he would further discuss these issues at the pt's appointment on Aug. 26.  I called the pt back and he verbalized understanding, and he was told to call back if he had any other questions or concerns.

## 2020-03-24 ENCOUNTER — Other Ambulatory Visit: Payer: Self-pay

## 2020-03-24 ENCOUNTER — Encounter (HOSPITAL_COMMUNITY): Payer: Self-pay | Admitting: Hematology

## 2020-03-24 ENCOUNTER — Inpatient Hospital Stay (HOSPITAL_COMMUNITY): Payer: 59 | Attending: Hematology | Admitting: Hematology

## 2020-03-24 ENCOUNTER — Inpatient Hospital Stay (HOSPITAL_COMMUNITY): Payer: 59

## 2020-03-24 VITALS — BP 120/78 | HR 57 | Temp 97.1°F | Resp 17 | Wt 206.4 lb

## 2020-03-24 DIAGNOSIS — Z809 Family history of malignant neoplasm, unspecified: Secondary | ICD-10-CM | POA: Diagnosis not present

## 2020-03-24 DIAGNOSIS — M7918 Myalgia, other site: Secondary | ICD-10-CM | POA: Diagnosis not present

## 2020-03-24 DIAGNOSIS — Z79899 Other long term (current) drug therapy: Secondary | ICD-10-CM | POA: Insufficient documentation

## 2020-03-24 DIAGNOSIS — I2510 Atherosclerotic heart disease of native coronary artery without angina pectoris: Secondary | ICD-10-CM | POA: Diagnosis not present

## 2020-03-24 DIAGNOSIS — F17210 Nicotine dependence, cigarettes, uncomplicated: Secondary | ICD-10-CM | POA: Insufficient documentation

## 2020-03-24 DIAGNOSIS — I252 Old myocardial infarction: Secondary | ICD-10-CM | POA: Diagnosis not present

## 2020-03-24 DIAGNOSIS — M25551 Pain in right hip: Secondary | ICD-10-CM | POA: Diagnosis not present

## 2020-03-24 DIAGNOSIS — C8300 Small cell B-cell lymphoma, unspecified site: Secondary | ICD-10-CM | POA: Diagnosis not present

## 2020-03-24 DIAGNOSIS — C9110 Chronic lymphocytic leukemia of B-cell type not having achieved remission: Secondary | ICD-10-CM | POA: Diagnosis present

## 2020-03-24 LAB — COMPREHENSIVE METABOLIC PANEL
ALT: 18 U/L (ref 0–44)
AST: 20 U/L (ref 15–41)
Albumin: 4.2 g/dL (ref 3.5–5.0)
Alkaline Phosphatase: 64 U/L (ref 38–126)
Anion gap: 8 (ref 5–15)
BUN: 28 mg/dL — ABNORMAL HIGH (ref 6–20)
CO2: 23 mmol/L (ref 22–32)
Calcium: 9 mg/dL (ref 8.9–10.3)
Chloride: 102 mmol/L (ref 98–111)
Creatinine, Ser: 1.13 mg/dL (ref 0.61–1.24)
GFR calc Af Amer: >60 mL/min (ref >60)
GFR calc non Af Amer: >60 mL/min (ref >60)
Glucose, Bld: 89 mg/dL (ref 70–99)
Potassium: 4.1 mmol/L (ref 3.5–5.1)
Sodium: 133 mmol/L — ABNORMAL LOW (ref 135–145)
Total Bilirubin: 0.8 mg/dL (ref 0.3–1.2)
Total Protein: 6.8 g/dL (ref 6.5–8.1)

## 2020-03-24 LAB — CBC WITH DIFFERENTIAL/PLATELET
Abs Immature Granulocytes: 0.02 10*3/uL (ref 0.00–0.07)
Basophils Absolute: 0 10*3/uL (ref 0.0–0.1)
Basophils Relative: 0 %
Eosinophils Absolute: 0 10*3/uL (ref 0.0–0.5)
Eosinophils Relative: 0 %
HCT: 39.8 % (ref 39.0–52.0)
Hemoglobin: 13.5 g/dL (ref 13.0–17.0)
Immature Granulocytes: 0 %
Lymphocytes Relative: 21 %
Lymphs Abs: 1.5 10*3/uL (ref 0.7–4.0)
MCH: 31.6 pg (ref 26.0–34.0)
MCHC: 33.9 g/dL (ref 30.0–36.0)
MCV: 93.2 fL (ref 80.0–100.0)
Monocytes Absolute: 0.9 10*3/uL (ref 0.1–1.0)
Monocytes Relative: 12 %
Neutro Abs: 4.8 10*3/uL (ref 1.7–7.7)
Neutrophils Relative %: 67 %
Platelets: 191 10*3/uL (ref 150–400)
RBC: 4.27 MIL/uL (ref 4.22–5.81)
RDW: 13.5 % (ref 11.5–15.5)
WBC: 7.3 10*3/uL (ref 4.0–10.5)
nRBC: 0 % (ref 0.0–0.2)

## 2020-03-24 LAB — MAGNESIUM: Magnesium: 2 mg/dL (ref 1.7–2.4)

## 2020-03-24 LAB — LACTATE DEHYDROGENASE: LDH: 153 U/L (ref 98–192)

## 2020-03-24 NOTE — Progress Notes (Signed)
[place], [place] [address]   CLINIC:  Medical Oncology/Hematology  PCP:  Orpah Greek, MD [address] / [place] [address]  [phone]  REASON FOR VISIT:  Follow-up for small lymphocytic lymphoma  PRIOR THERAPY: Obinutuzumab & venetoclax x 6 cycles from 06/09/2019 to 10/27/2019.  CURRENT THERAPY: Venetoclax  INTERVAL HISTORY:  [name], a 59 y.o. male, returns for routine follow-up for his small lymphocytic lymphoma. Nycere was last seen on 02/11/2020.  Today he reports that he started his new bottle of venetoclax on 8/20 and started feeling fatigued by evening of 8/21; he is still taking 2 tablets of venetoclax. He continues feeling fatigued through today. He denies having abdominal pain or N/V.   REVIEW OF SYSTEMS:  Review of Systems  Constitutional: Positive for appetite change (mildly decreased) and fatigue (severe).  Gastrointestinal: Negative for abdominal pain, nausea and vomiting.  Musculoskeletal: Positive for arthralgias (5/10 R hip pain).  All other systems reviewed and are negative.   PAST MEDICAL/SURGICAL HISTORY:  Past Medical History:  Diagnosis Date   CAD (coronary artery disease)    GERD (gastroesophageal reflux disease)    HTN (hypertension)    Hypercholesterolemia    MI (myocardial infarction) ([place]) 2003   Past Surgical History:  Procedure Laterality Date   AXILLARY LYMPH NODE BIOPSY Left 04/27/2019   Procedure: AXILLARY LYMPH NODE BIOPSY;  Surgeon: Aviva Signs, MD;  Location: [facility];  Service: General;  Laterality: Left;   BIOPSY  11/26/2018   Procedure: BIOPSY;  Surgeon: Daneil Dolin, MD;  Location: [facility];  Service: Endoscopy;;   CHOLECYSTECTOMY N/A 05/18/2019   Procedure: LAPAROSCOPIC CHOLECYSTECTOMY;  Surgeon: Aviva Signs, MD;  Location: [facility];  Service: General;  Laterality: N/A;   CORONARY ANGIOPLASTY WITH STENT PLACEMENT     2003    ESOPHAGOGASTRODUODENOSCOPY N/A 11/26/2018   mild erosive reflux esophagitis, gastric erythema. 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  Tobacco Use   Smoking status: Current Every Day Smoker    Packs/day: 0.50    Years: 25.00    Pack years: 12.50    Types: Cigarettes   Smokeless tobacco: Never Used  Scientific laboratory technician Use: Never used  Substance and Sexual Activity   Alcohol use: Yes    Comment: occ   Drug use: Never   Sexual activity: Yes  Other Topics Concern   Not on file  Social History Narrative   Not on file   Social Determinants of Health   Financial Resource Strain: Low Risk    Difficulty of Paying Living Expenses: Not hard at all  Food Insecurity: No Food Insecurity   Worried About Charity fundraiser in the Last Year: Never true   [place] in the Last Year:  Never true  Transportation Needs: No Transportation Needs   Lack of Transportation (Medical): No   Lack of Transportation (Non-Medical): No  Physical Activity: Sufficiently Active   Days of Exercise per Week: 4 days   Minutes of Exercise per Session: 60 min  Stress: No Stress Concern Present   Feeling of Stress : Not at all  Social Connections: Moderately Integrated   Frequency of Communication with Friends and Family: More than three times a week   Frequency of Social Gatherings with Friends and Family: More  than three times a week   Attends Religious Services: More than 4 times per year   Active Member of [employer] or Organizations: Yes   Attends Archivist Meetings: 1 to 4 times per year   Marital Status: Divorced  Human resources officer Violence: Not At Risk   Fear of Current or Ex-Partner: No   Emotionally Abused: No   Physically Abused: No   Sexually Abused: No    FAMILY HISTORY:  Family History  Problem Relation Age of Onset   Colon polyps Father        thinks he may have had polyps, possibly in his 64s.    Heart attack Father    Dementia Mother    Hypotension Mother    Stroke Sister    Diabetes Maternal Grandmother    Stroke Maternal Grandfather    Cancer Paternal Grandmother    Congestive Heart Failure Paternal Grandfather    Kidney cancer Daughter    Colon cancer Neg Hx    Pancreatic cancer Neg Hx    Liver disease Neg Hx     CURRENT MEDICATIONS:  Current Outpatient Medications  Medication Sig Dispense Refill   acyclovir (ZOVIRAX) 400 MG tablet TAKE 1 TABLET BY MOUTH EVERY DAY 90 tablet 1   allopurinol (ZYLOPRIM) 300 MG tablet TAKE 1 TABLET BY MOUTH EVERY DAY 90 tablet 2   aspirin EC 81 MG tablet Take 81 mg by mouth 3 (three) times a week. Pt takes M,W,F     baclofen (LIORESAL) 10 MG tablet Take 10 mg by mouth 2 (two) times daily as needed for muscle spasms.      carvedilol (COREG) 3.125 MG tablet Take 3.125 mg by mouth 2 (two) times daily.     cimetidine (TAGAMET) 200 MG tablet Take 200 mg by mouth as needed.      escitalopram (LEXAPRO) 20 MG tablet Take 20 mg by mouth daily.      gabapentin (NEURONTIN) 100 MG capsule Take 100 mg by mouth 2 (two) times daily as needed (pain).      lisinopril (PRINIVIL,ZESTRIL) 10 MG tablet Take 10 mg by mouth daily.      meloxicam (MOBIC) 7.5 MG tablet TAKE 1 TABLET BY MOUTH EVERY DAY 30 tablet 2   naproxen sodium (ALEVE) 220 MG tablet Take 220-440 mg by mouth 2 (two) times daily as needed (pain).        nitroGLYCERIN (NITROSTAT) 0.4 MG SL tablet SMARTSIG:1 Tablet(s) Sublingual As Needed     obinutuzumab in sodium chloride 0.9 % 250 mL Inject into the vein.     pantoprazole (PROTONIX) 40 MG tablet Take 40 mg by mouth as needed.      Pitavastatin Calcium (LIVALO) 4 MG TABS Take 4 mg by mouth daily.     prasugrel (EFFIENT) 10 MG TABS tablet Take 10 mg by mouth daily.     sulfamethoxazole-trimethoprim (BACTRIM DS) 800-160 MG tablet TAKE 1 TABLET BY MOUTH THREE TIMES A WEEK 12 tablet  5   VENCLEXTA 100 MG TABS TAKE 2 TABS(200 MG) BY MOUTH DAILY. 60 tablet 1   No current facility-administered medications for this visit.    ALLERGIES:  No Known Allergies  PHYSICAL EXAM:  Performance status (ECOG): 1 - Symptomatic but completely ambulatory  Vitals:   03/24/20 1114  BP: 120/78  Pulse: (!) 57  Resp: 17  Temp: (!) 97.1 F (36.2 C)  SpO2: 100%   Wt Readings from Last 3 Encounters:  03/24/20 206 lb 6.4 oz (93.6 kg)  02/11/20 207 lb 4.8 oz (94 kg)  12/30/19 203 lb (92.1 kg)   Physical Exam Vitals reviewed.  Constitutional:      Appearance: Normal appearance.  Cardiovascular:     Rate and Rhythm: Normal rate and regular rhythm.     Pulses: Normal pulses.     Heart sounds: Normal heart sounds.  Pulmonary:     Effort: Pulmonary effort is normal.     Breath sounds: Normal breath sounds.  Abdominal:     Palpations: Abdomen is soft. There is no mass.     Tenderness: There is no abdominal tenderness.  Musculoskeletal:     Right lower leg: No edema.     Left lower leg: No edema.  Neurological:     General: No focal deficit present.     Mental Status: He is alert and oriented to person, place, and time.  Psychiatric:        Mood and Affect: Mood normal.        Behavior: Behavior normal.     LABORATORY DATA:  I have reviewed the labs as listed.  CBC Latest Ref Rng & Units 03/24/2020 02/11/2020 12/30/2019  WBC 4.0 - 10.5 K/uL 7.3 5.1 5.5  Hemoglobin 13.0 - 17.0 g/dL 13.5  12.9(L) 12.8(L)  Hematocrit 39 - 52 % 39.8 38.8(L) 39.0  Platelets 150 - 400 K/uL 191 181 179   CMP Latest Ref Rng & Units 03/24/2020 02/11/2020 12/30/2019  Glucose 70 - 99 mg/dL 89 81 91  BUN 6 - 20 mg/dL 28(H) 19 15  Creatinine 0.61 - 1.24 mg/dL 1.13 1.07 0.85  Sodium 135 - 145 mmol/L 133(L) 136 138  Potassium 3.5 - 5.1 mmol/L 4.1 3.8 3.9  Chloride 98 - 111 mmol/L 102 106 104  CO2 22 - 32 mmol/L 23 23 25   Calcium 8.9 - 10.3 mg/dL 9.0 8.8(L) 8.8(L)  Total Protein 6.5 - 8.1 g/dL 6.8 6.5 6.6  Total Bilirubin 0.3 - 1.2 mg/dL 0.8 0.7 0.6  Alkaline Phos 38 - 126 U/L 64 64 73  AST 15 - 41 U/L 20 16 21   ALT 0 - 44 U/L 18 16 23       Component Value Date/Time   RBC 4.27 03/24/2020 1008   MCV 93.2 03/24/2020 1008   MCH 31.6 03/24/2020 1008   MCHC 33.9 03/24/2020 1008   RDW 13.5 03/24/2020 1008   LYMPHSABS 1.5 03/24/2020 1008   MONOABS 0.9 03/24/2020 1008   EOSABS 0.0 03/24/2020 1008   BASOSABS 0.0 03/24/2020 1008   Lab Results  Component Value Date   LDH 153 03/24/2020   LDH 147 02/11/2020   LDH 154 12/30/2019    DIAGNOSTIC IMAGING:  I have independently reviewed the scans and discussed with the patient. No results found.   ASSESSMENT:  1. Clinical stage IIIb small lymphocytic lymphoma: -6 cycles of obinutuzumab and venetoclax from 06/09/2019 through 10/27/2019. -PET scan on 11/23/2019 showed complete metabolic response to therapy of lymphoma. Slight hypermetabolism in the right inguinal  region from hernia surgery. New hypermetabolic soft tissue fullness about the ischial tuberosity, possibly related to hamstring origin injury. -He is currently on venetoclax 200 mg daily, dose reduced secondary to carvedilol.  2. CAD: -Cardiac catheterization on 10/01/2019 found to have 100% occlusion of the RCA. -Angioplasty and stenting of the circumflex was done. -He was started on Effient and Livalo. -Echocardiogram on 01/18/2020 by Dr. Sabra Heck in [place] LVEF 50-55% with mild global  hypokinesia, moderate LVH, normal RV function, stage I diastolic dysfunction.  3.  Right posterior hip pain: -MRI of the pelvis without contrast on 01/10/2020 shows complete tear of the right conjoined semitendinosus and biceps femoris tendon at the ischial tuberosity with 2.2 cm retraction.  High-grade partial tear of the right semimembranosus tendon at the ischial tuberosity. -He was evaluated by Dr. Mardelle Matte and conservative management recommended.   PLAN:  1. Clinical stage IIIb small lymphocytic lymphoma: -I have reviewed labs from today.  White count and platelets are normal.  Hemoglobin is normal.  LFTs are normal.  Electrolytes are normal. -Continue venetoclax 200 mg daily. -He developed weakness since Saturday when he started taking new bottle of venetoclax.  He may skip venetoclax for few days and started back after weakness resolves. -I plan to see him back in 6 weeks for follow-up.  2. Right posterior hip pain: -He was evaluated by Dr. Mardelle Matte and conservative management recommended.  This will likely improve in 6 to 12 months.  3. Musculoskeletal pains: -He had some joint pain since venetoclax was started.  Continue Mobic 7.5 mg daily.  4. ID prophylaxis: -Continue Bactrim 3 times a week.  Could not tolerate acyclovir..  Orders placed this encounter:  No orders of the defined types were placed in this encounter.    Derek Jack, MD [place] [phone]   I, Milinda Antis, am acting as a scribe for Dr. Sanda Linger.  I, Derek Jack MD, have reviewed the above documentation for accuracy and completeness, and I agree with the above.

## 2020-03-24 NOTE — Patient Instructions (Signed)
[place] at [facility] Discharge Instructions  You were seen today by Dr. Delton Coombes. He went over your recent results. Drink plenty of water daily. Dr. Delton Coombes will see you back in 6 weeks for labs and follow up.   Thank you for choosing [place] at [facility] to provide your oncology and hematology care.  To afford each patient quality time with our provider, please arrive at least 15 minutes before your scheduled appointment time.   If you have a lab appointment with the [facility] please come in thru the Main Entrance and check in at the main information desk  You need to re-schedule your appointment should you arrive 10 or more minutes late.  We strive to give you quality time with our providers, and arriving late affects you and other patients whose appointments are after yours.  Also, if you no show three or more times for appointments you may be dismissed from the clinic at the providers discretion.     Again, thank you for choosing [facility].  Our hope is that these requests will decrease the amount of time that you wait before being seen by our physicians.       _____________________________________________________________  Should you have questions after your visit to [facility], please contact our office at [phone] between the hours of 8:00 a.m. and 4:30 p.m.  Voicemails left after 4:00 p.m. will not be returned until the following business day.  For prescription refill requests, have your pharmacy contact our office and allow 72 hours.    Cancer Center Support Programs:   > Cancer Support Group  2nd Tuesday of the month 1pm-2pm, Journey Room

## 2020-04-07 ENCOUNTER — Other Ambulatory Visit (HOSPITAL_COMMUNITY): Payer: Self-pay | Admitting: Nurse Practitioner

## 2020-04-07 DIAGNOSIS — C8300 Small cell B-cell lymphoma, unspecified site: Secondary | ICD-10-CM

## 2020-04-13 MED FILL — VENCLEXTA 100 MG TABS: 100 | 30 days supply | Qty: 60 | Fill #0

## 2020-04-30 ENCOUNTER — Other Ambulatory Visit (HOSPITAL_COMMUNITY): Payer: Self-pay | Admitting: Hematology

## 2020-04-30 DIAGNOSIS — C8300 Small cell B-cell lymphoma, unspecified site: Secondary | ICD-10-CM

## 2020-05-05 ENCOUNTER — Other Ambulatory Visit: Payer: Self-pay

## 2020-05-05 ENCOUNTER — Inpatient Hospital Stay (HOSPITAL_BASED_OUTPATIENT_CLINIC_OR_DEPARTMENT_OTHER): Payer: 59 | Admitting: Hematology

## 2020-05-05 ENCOUNTER — Inpatient Hospital Stay (HOSPITAL_COMMUNITY): Payer: 59 | Attending: Hematology

## 2020-05-05 VITALS — BP 128/81 | HR 53 | Temp 96.9°F | Resp 17 | Wt 207.3 lb

## 2020-05-05 DIAGNOSIS — Z9221 Personal history of antineoplastic chemotherapy: Secondary | ICD-10-CM | POA: Diagnosis not present

## 2020-05-05 DIAGNOSIS — Z8249 Family history of ischemic heart disease and other diseases of the circulatory system: Secondary | ICD-10-CM | POA: Diagnosis not present

## 2020-05-05 DIAGNOSIS — F17210 Nicotine dependence, cigarettes, uncomplicated: Secondary | ICD-10-CM | POA: Diagnosis not present

## 2020-05-05 DIAGNOSIS — Z809 Family history of malignant neoplasm, unspecified: Secondary | ICD-10-CM | POA: Diagnosis not present

## 2020-05-05 DIAGNOSIS — I2510 Atherosclerotic heart disease of native coronary artery without angina pectoris: Secondary | ICD-10-CM | POA: Insufficient documentation

## 2020-05-05 DIAGNOSIS — I10 Essential (primary) hypertension: Secondary | ICD-10-CM | POA: Insufficient documentation

## 2020-05-05 DIAGNOSIS — Z7982 Long term (current) use of aspirin: Secondary | ICD-10-CM | POA: Diagnosis not present

## 2020-05-05 DIAGNOSIS — I252 Old myocardial infarction: Secondary | ICD-10-CM | POA: Diagnosis not present

## 2020-05-05 DIAGNOSIS — K219 Gastro-esophageal reflux disease without esophagitis: Secondary | ICD-10-CM | POA: Insufficient documentation

## 2020-05-05 DIAGNOSIS — Z8051 Family history of malignant neoplasm of kidney: Secondary | ICD-10-CM | POA: Diagnosis not present

## 2020-05-05 DIAGNOSIS — E7800 Pure hypercholesterolemia, unspecified: Secondary | ICD-10-CM | POA: Insufficient documentation

## 2020-05-05 DIAGNOSIS — C8300 Small cell B-cell lymphoma, unspecified site: Secondary | ICD-10-CM | POA: Diagnosis not present

## 2020-05-05 DIAGNOSIS — Z833 Family history of diabetes mellitus: Secondary | ICD-10-CM | POA: Insufficient documentation

## 2020-05-05 DIAGNOSIS — Z79899 Other long term (current) drug therapy: Secondary | ICD-10-CM | POA: Diagnosis not present

## 2020-05-05 LAB — COMPREHENSIVE METABOLIC PANEL
ALT: 15 U/L (ref 0–44)
AST: 17 U/L (ref 15–41)
Albumin: 3.7 g/dL (ref 3.5–5.0)
Alkaline Phosphatase: 52 U/L (ref 38–126)
Anion gap: 8 (ref 5–15)
BUN: 13 mg/dL (ref 6–20)
CO2: 23 mmol/L (ref 22–32)
Calcium: 8.6 mg/dL — ABNORMAL LOW (ref 8.9–10.3)
Chloride: 105 mmol/L (ref 98–111)
Creatinine, Ser: 0.89 mg/dL (ref 0.61–1.24)
GFR calc non Af Amer: >60 mL/min (ref >60)
Glucose, Bld: 89 mg/dL (ref 70–99)
Potassium: 4 mmol/L (ref 3.5–5.1)
Sodium: 136 mmol/L (ref 135–145)
Total Bilirubin: 0.8 mg/dL (ref 0.3–1.2)
Total Protein: 6.1 g/dL — ABNORMAL LOW (ref 6.5–8.1)

## 2020-05-05 LAB — CBC WITH DIFFERENTIAL/PLATELET
Abs Immature Granulocytes: 0.01 10*3/uL (ref 0.00–0.07)
Basophils Absolute: 0 10*3/uL (ref 0.0–0.1)
Basophils Relative: 0 %
Eosinophils Absolute: 0 10*3/uL (ref 0.0–0.5)
Eosinophils Relative: 0 %
HCT: 35.3 % — ABNORMAL LOW (ref 39.0–52.0)
Hemoglobin: 11.9 g/dL — ABNORMAL LOW (ref 13.0–17.0)
Immature Granulocytes: 0 %
Lymphocytes Relative: 26 %
Lymphs Abs: 1.2 10*3/uL (ref 0.7–4.0)
MCH: 31.6 pg (ref 26.0–34.0)
MCHC: 33.7 g/dL (ref 30.0–36.0)
MCV: 93.9 fL (ref 80.0–100.0)
Monocytes Absolute: 0.6 10*3/uL (ref 0.1–1.0)
Monocytes Relative: 12 %
Neutro Abs: 2.9 10*3/uL (ref 1.7–7.7)
Neutrophils Relative %: 62 %
Platelets: 177 10*3/uL (ref 150–400)
RBC: 3.76 MIL/uL — ABNORMAL LOW (ref 4.22–5.81)
RDW: 13.5 % (ref 11.5–15.5)
WBC: 4.7 10*3/uL (ref 4.0–10.5)
nRBC: 0 % (ref 0.0–0.2)

## 2020-05-05 LAB — URIC ACID: Uric Acid, Serum: 3.8 mg/dL (ref 3.7–8.6)

## 2020-05-05 LAB — MAGNESIUM: Magnesium: 2.1 mg/dL (ref 1.7–2.4)

## 2020-05-05 LAB — PHOSPHORUS: Phosphorus: 4.4 mg/dL (ref 2.5–4.6)

## 2020-05-05 LAB — LACTATE DEHYDROGENASE: LDH: 141 U/L (ref 98–192)

## 2020-05-05 NOTE — Progress Notes (Signed)
[place], [place] [address]   CLINIC:  Medical Oncology/Hematology  PCP:  Orpah Greek, MD [address] / [place] [address]  [phone]  REASON FOR VISIT:  Follow-up for small lymphocytic lymphoma  PRIOR THERAPY: Obinutuzumab & venetoclax x 6 cycles from 06/09/2019 to 10/27/2019  CURRENT THERAPY: Venetoclax daily  INTERVAL HISTORY:  Mr. Amrit Cress, a 59 y.o. male, returns for routine follow-up for his small lymphocytic lymphoma. Audie was last seen on 03/24/2020.  Today he reports feeling well. He continues having right hip pain and takes Mobic for it, though it is not helping ease the pain. He continues taking 2 tablets of venetoclax daily and is tolerating it well. He reports seeing blood in the stool and on the toilet paper twice since his last visit, since he is taking Effient and ASA 81 mg.   REVIEW OF SYSTEMS:  Review of Systems  Constitutional: Positive for appetite change (50%) and fatigue (50%).  Gastrointestinal: Positive for abdominal pain, blood in stool (2 episodes) and diarrhea.  Musculoskeletal: Positive for arthralgias (R hip pain).  All other systems reviewed and are negative.   PAST MEDICAL/SURGICAL HISTORY:  Past Medical History:  Diagnosis Date  . CAD (coronary artery disease)   . GERD (gastroesophageal reflux disease)   . HTN (hypertension)   . Hypercholesterolemia   . MI (myocardial infarction) ([place]) 2003   Past Surgical History:  Procedure Laterality Date  . AXILLARY LYMPH NODE BIOPSY Left 04/27/2019   Procedure: AXILLARY LYMPH NODE BIOPSY;  Surgeon: Aviva Signs, MD;  Location: [facility];  Service: General;  Laterality: Left;  . BIOPSY  11/26/2018   Procedure: BIOPSY;  Surgeon: Daneil Dolin, MD;  Location: [facility] SUITE;  Service: Endoscopy;;  . CHOLECYSTECTOMY N/A 05/18/2019   Procedure: LAPAROSCOPIC CHOLECYSTECTOMY;  Surgeon: Aviva Signs, MD;  Location: [facility];  Service: General;   Laterality: N/A;  . CORONARY ANGIOPLASTY WITH STENT PLACEMENT     2003  . ESOPHAGOGASTRODUODENOSCOPY N/A 11/26/2018   mild erosive reflux esophagitis, gastric erythema. Negative H.pylori   . INGUINAL HERNIA REPAIR Right 04/27/2019   Procedure: HERNIA REPAIR INGUINAL ADULT;  Surgeon: Aviva Signs, MD;  Location: [facility];  Service: General;  Laterality: Right;  . KNEE SURGERY Right   . lumbar fusion with cage  07/2017  . NASAL SINUS SURGERY    . TONSILLECTOMY AND ADENOIDECTOMY      SOCIAL HISTORY:  Social History   Socioeconomic History  . Marital status: Divorced    Spouse name: Not on file  . Number of children: 2  . Years of education: Not on file  . Highest education level: Not on file  Occupational History  . 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 Smoking status: Current Every Day Smoker    Packs/day: 0.50    Years: 25.00    Pack years: 12.50    Types: Cigarettes  . Smokeless tobacco: Never Used  Vaping Use  . Vaping Use: Never used  Substance and Sexual Activity  . Alcohol use: Yes    Comment: occ  . Drug use: Never  . Sexual activity: Yes  Other Topics Concern  . Not on file  Social History Narrative  . Not on file   Social Determinants of Health   Financial Resource Strain:   . Difficulty of Paying Living Expenses: Not on file  Food Insecurity:   . Worried About Running  Out of Food in the Last Year: Not on file  . Ran Out of Food in the Last Year: Not on file  Transportation Needs:   . Lack of Transportation (Medical): Not on file  . Lack of Transportation (Non-Medical): Not on file  Physical Activity:   . Days of Exercise per Week: Not on file  . Minutes of Exercise per Session: Not on file  Stress:   . Feeling of Stress : Not on file  Social Connections:   . Frequency of Communication with Friends and Family: Not on file  . Frequency of Social Gatherings with Friends and Family: Not on file  . Attends Religious  Services: Not on file  . Active Member of Clubs or Organizations: Not on file  . Attends Archivist Meetings: Not on file  . Marital Status: Not on file  Intimate Partner Violence:   . Fear of Current or Ex-Partner: Not on file  . Emotionally Abused: Not on file  . Physically Abused: Not on file  . Sexually Abused: Not on file    FAMILY HISTORY:  Family History  Problem Relation Age of Onset  . Colon polyps Father        thinks he may have had polyps, possibly in his 71s.   [name] Heart attack Father   . Dementia Mother   . Hypotension Mother   . Stroke Sister   . Diabetes Maternal Grandmother   . Stroke Maternal Grandfather   . Cancer Paternal Grandmother   . Congestive Heart Failure Paternal Grandfather   . Kidney cancer Daughter   . Colon cancer Neg Hx   . Pancreatic cancer Neg Hx   . Liver disease Neg Hx     CURRENT MEDICATIONS:  Current Outpatient Medications  Medication Sig Dispense Refill  . acetaminophen (TYLENOL 8 HOUR ARTHRITIS PAIN) 650 MG CR tablet Take 650 mg by mouth every 8 (eight) hours as needed for pain.    [name] acyclovir (ZOVIRAX) 400 MG tablet TAKE 1 TABLET BY MOUTH EVERY DAY 90 tablet 1  . allopurinol (ZYLOPRIM) 300 MG tablet TAKE 1 TABLET BY MOUTH EVERY DAY 90 tablet 2  . aspirin EC 81 MG tablet Take 81 mg by mouth 3 (three) times a week. Pt takes M,W,F    . baclofen (LIORESAL) 10 MG tablet Take 10 mg by mouth 2 (two) times daily as needed for muscle spasms.     . carvedilol (COREG) 3.125 MG tablet Take 3.125 mg by mouth 2 (two) times daily. Two tablets in the morning and two tablets at night    . cimetidine (TAGAMET) 200 MG tablet Take 200 mg by mouth as needed.     [name] escitalopram (LEXAPRO) 20 MG tablet Take 20 mg by mouth daily.     [name] gabapentin (NEURONTIN) 100 MG capsule Take 100 mg by mouth 2 (two) times daily as needed (pain).     [name] lisinopril (PRINIVIL,ZESTRIL) 10 MG tablet Take 10 mg by mouth daily.     . meloxicam (MOBIC) 7.5 MG tablet TAKE 1  TABLET BY MOUTH EVERY DAY 30 tablet 2  . obinutuzumab in sodium chloride 0.9 % 250 mL Inject into the vein.    . pantoprazole (PROTONIX) 40 MG tablet Take 40 mg by mouth as needed.     . Pitavastatin Calcium (LIVALO) 4 MG TABS Take 4 mg by mouth daily.    . prasugrel (EFFIENT) 10 MG TABS tablet Take 10 mg by mouth daily.    [name] sulfamethoxazole-trimethoprim (  BACTRIM DS) 800-160 MG tablet TAKE 1 TABLET BY MOUTH THREE TIMES A WEEK 12 tablet 5  . VENCLEXTA 100 MG TABS TAKE 2 TABS(200 MG) BY MOUTH DAILY. 60 tablet 1  . nitroGLYCERIN (NITROSTAT) 0.4 MG SL tablet SMARTSIG:1 Tablet(s) Sublingual As Needed (Patient not taking: Reported on 05/05/2020)     No current facility-administered medications for this visit.    ALLERGIES:  No Known Allergies  PHYSICAL EXAM:  Performance status (ECOG): 1 - Symptomatic but completely ambulatory  Vitals:   05/05/20 1138  BP: 128/81  Pulse: (!) 53  Resp: 17  Temp: (!) 96.9 F (36.1 C)  SpO2: 99%   Wt Readings from Last 3 Encounters:  05/05/20 207 lb 4.8 oz (94 kg)  03/24/20 206 lb 6.4 oz (93.6 kg)  02/11/20 207 lb 4.8 oz (94 kg)   Physical Exam Vitals reviewed.  Constitutional:      Appearance: Normal appearance.  Cardiovascular:     Rate and Rhythm: Normal rate and regular rhythm.     Pulses: Normal pulses.     Heart sounds: Normal heart sounds.  Pulmonary:     Effort: Pulmonary effort is normal.     Breath sounds: Normal breath sounds.  Abdominal:     Palpations: Abdomen is soft. There is no hepatomegaly, splenomegaly or mass.     Tenderness: There is no abdominal tenderness.     Hernia: No hernia is present.  Lymphadenopathy:     Upper Body:     Right upper body: No supraclavicular, axillary or pectoral adenopathy.     Left upper body: No supraclavicular, axillary or pectoral adenopathy.     Lower Body: No right inguinal adenopathy. No left inguinal adenopathy.  Neurological:     General: No focal deficit present.     Mental Status: He  is alert and oriented to person, place, and time.  Psychiatric:        Mood and Affect: Mood normal.        Behavior: Behavior normal.     LABORATORY DATA:  I have reviewed the labs as listed.  CBC Latest Ref Rng & Units 05/05/2020 03/24/2020 02/11/2020  WBC 4.0 - 10.5 K/uL 4.7 7.3 5.1  Hemoglobin 13.0 - 17.0 g/dL 11.9(L) 13.5 12.9(L)  Hematocrit 39 - 52 % 35.3(L) 39.8 38.8(L)  Platelets 150 - 400 K/uL 177 191 181   CMP Latest Ref Rng & Units 05/05/2020 03/24/2020 02/11/2020  Glucose 70 - 99 mg/dL 89 89 81  BUN 6 - 20 mg/dL 13 28(H) 19  Creatinine 0.61 - 1.24 mg/dL 0.89 1.13 1.07  Sodium 135 - 145 mmol/L 136 133(L) 136  Potassium 3.5 - 5.1 mmol/L 4.0 4.1 3.8  Chloride 98 - 111 mmol/L 105 102 106  CO2 22 - 32 mmol/L 23 23 23   Calcium 8.9 - 10.3 mg/dL 8.6(L) 9.0 8.8(L)  Total Protein 6.5 - 8.1 g/dL 6.1(L) 6.8 6.5  Total Bilirubin 0.3 - 1.2 mg/dL 0.8 0.8 0.7  Alkaline Phos 38 - 126 U/L 52 64 64  AST 15 - 41 U/L 17 20 16   ALT 0 - 44 U/L 15 18 16       Component Value Date/Time   RBC 3.76 (L) 05/05/2020 1045   MCV 93.9 05/05/2020 1045   MCH 31.6 05/05/2020 1045   MCHC 33.7 05/05/2020 1045   RDW 13.5 05/05/2020 1045   LYMPHSABS 1.2 05/05/2020 1045   MONOABS 0.6 05/05/2020 1045   EOSABS 0.0 05/05/2020 1045   BASOSABS 0.0 05/05/2020 1045  Lab Results  Component Value Date   LDH 141 05/05/2020   LDH 153 03/24/2020   LDH 147 02/11/2020    DIAGNOSTIC IMAGING:  I have independently reviewed the scans and discussed with the patient. No results found.   ASSESSMENT:  1. Clinical stage IIIb small lymphocytic lymphoma: -6 cycles of obinutuzumab and venetoclax from 06/09/2019 through 10/27/2019. -PET scan on 11/23/2019 showed complete metabolic response to therapy of lymphoma. Slight hypermetabolism in the right inguinal region from hernia surgery. New hypermetabolic soft tissue fullness about the ischial tuberosity, possibly related to hamstring origin injury. -He is currently on  venetoclax 200 mg daily, dose reduced secondary to carvedilol.  2. CAD: -Cardiac catheterization on 10/01/2019 found to have 100% occlusion of the RCA. -Angioplasty and stenting of the circumflex was done. -He was started on Effient and Livalo. -Echocardiogram on 01/18/2020 by Dr. Sabra Heck in [place] LVEF 50-55% with mild global hypokinesia, moderate LVH, normal RV function, stage I diastolic dysfunction.  3.  Right posterior hip pain: -MRI of the pelvis without contrast on 01/10/2020 shows complete tear of the right conjoined semitendinosus and biceps femoris tendon at the ischial tuberosity with 2.2 cm retraction.  High-grade partial tear of the right semimembranosus tendon at the ischial tuberosity. -He was evaluated by Dr. Mardelle Matte and conservative management recommended.   PLAN:  1. Clinical stage IIIb small lymphocytic lymphoma: -He is tolerating venetoclax 200 mg daily very well. -Weakness has completely resolved. -Labs from 05/05/2020 with white count 4.7 and platelet count 177.  LDH is 141. -Clinically no palpable adenopathy or splenomegaly. -Recommend PET CT scan in 6 weeks.  Also recommend CLL MRD testing by flow cytometry prior to discontinuation of venetoclax in November. -His hemoglobin dropped slightly and he has seen blood in his stools twice in the last 6 weeks.  He is on Effient and aspirin for his heart.  2. Right posterior hip pain: -He was evaluated by Dr. Mardelle Matte and conservative management recommended.  He still has some pain.  3. Musculoskeletal pains: -He is continuing Mobic 7.5 mg daily for some joint pain from venetoclax.  4. ID prophylaxis: -Continue Bactrim 3 times a week.  Could not tolerate acyclovir.  Orders placed this encounter:  Orders Placed This Encounter  Procedures  . NM PET Image Restag (PS) Skull Base To Thigh  . Miscellaneous [facility] test (send-out)     Derek Jack, MD [place] [phone]   I, Milinda Antis, am acting as a scribe for Dr. Sanda Linger.  I, Derek Jack MD, have reviewed the above documentation for accuracy and completeness, and I agree with the above.

## 2020-05-05 NOTE — Patient Instructions (Signed)
[place] at [facility] Discharge Instructions  You were seen today by Dr. Delton Coombes. He went over your recent results. Continue taking venetoclax daily. You will be scheduled for a PET scan before your next visit. Dr. Delton Coombes will see you back in 6 weeks for labs and follow up.   Thank you for choosing [place] at [facility] to provide your oncology and hematology care.  To afford each patient quality time with our provider, please arrive at least 15 minutes before your scheduled appointment time.   If you have a lab appointment with the [facility] please come in thru the Main Entrance and check in at the main information desk  You need to re-schedule your appointment should you arrive 10 or more minutes late.  We strive to give you quality time with our providers, and arriving late affects you and other patients whose appointments are after yours.  Also, if you no show three or more times for appointments you may be dismissed from the clinic at the providers discretion.     Again, thank you for choosing [facility].  Our hope is that these requests will decrease the amount of time that you wait before being seen by our physicians.       _____________________________________________________________  Should you have questions after your visit to [facility], please contact our office at [phone] between the hours of 8:00 a.m. and 4:30 p.m.  Voicemails left after 4:00 p.m. will not be returned until the following business day.  For prescription refill requests, have your pharmacy contact our office and allow 72 hours.    Cancer Center Support Programs:   > Cancer Support Group  2nd Tuesday of the month 1pm-2pm, Journey Room

## 2020-05-18 MED FILL — VENCLEXTA 100 MG TABS: 100 | 30 days supply | Qty: 60 | Fill #1

## 2020-05-29 ENCOUNTER — Other Ambulatory Visit (HOSPITAL_COMMUNITY): Payer: Self-pay | Admitting: Hematology

## 2020-05-29 DIAGNOSIS — C8300 Small cell B-cell lymphoma, unspecified site: Secondary | ICD-10-CM

## 2020-06-01 ENCOUNTER — Other Ambulatory Visit (HOSPITAL_COMMUNITY): Payer: Self-pay | Admitting: *Deleted

## 2020-06-01 DIAGNOSIS — C8300 Small cell B-cell lymphoma, unspecified site: Secondary | ICD-10-CM

## 2020-06-01 MED ORDER — ALLOPURINOL 300 MG PO TABS: 300.0000 mg | ORAL_TABLET | Freq: Every day | ORAL | 2 refills | Status: DC

## 2020-06-13 ENCOUNTER — Inpatient Hospital Stay (HOSPITAL_COMMUNITY): Payer: 59 | Attending: Hematology

## 2020-06-13 ENCOUNTER — Other Ambulatory Visit: Payer: Self-pay

## 2020-06-13 DIAGNOSIS — F17210 Nicotine dependence, cigarettes, uncomplicated: Secondary | ICD-10-CM | POA: Diagnosis not present

## 2020-06-13 DIAGNOSIS — Z7982 Long term (current) use of aspirin: Secondary | ICD-10-CM | POA: Diagnosis not present

## 2020-06-13 DIAGNOSIS — C9110 Chronic lymphocytic leukemia of B-cell type not having achieved remission: Secondary | ICD-10-CM | POA: Diagnosis present

## 2020-06-13 DIAGNOSIS — K219 Gastro-esophageal reflux disease without esophagitis: Secondary | ICD-10-CM | POA: Insufficient documentation

## 2020-06-13 DIAGNOSIS — I10 Essential (primary) hypertension: Secondary | ICD-10-CM | POA: Insufficient documentation

## 2020-06-13 DIAGNOSIS — E7800 Pure hypercholesterolemia, unspecified: Secondary | ICD-10-CM | POA: Insufficient documentation

## 2020-06-13 DIAGNOSIS — I252 Old myocardial infarction: Secondary | ICD-10-CM | POA: Insufficient documentation

## 2020-06-13 DIAGNOSIS — I2510 Atherosclerotic heart disease of native coronary artery without angina pectoris: Secondary | ICD-10-CM | POA: Diagnosis not present

## 2020-06-13 DIAGNOSIS — Z79899 Other long term (current) drug therapy: Secondary | ICD-10-CM | POA: Diagnosis not present

## 2020-06-13 DIAGNOSIS — C8300 Small cell B-cell lymphoma, unspecified site: Secondary | ICD-10-CM

## 2020-06-13 LAB — CBC WITH DIFFERENTIAL/PLATELET
Abs Immature Granulocytes: 0.02 10*3/uL (ref 0.00–0.07)
Basophils Absolute: 0 10*3/uL (ref 0.0–0.1)
Basophils Relative: 0 %
Eosinophils Absolute: 0 10*3/uL (ref 0.0–0.5)
Eosinophils Relative: 0 %
HCT: 38 % — ABNORMAL LOW (ref 39.0–52.0)
Hemoglobin: 12.6 g/dL — ABNORMAL LOW (ref 13.0–17.0)
Immature Granulocytes: 0 %
Lymphocytes Relative: 24 %
Lymphs Abs: 1.2 10*3/uL (ref 0.7–4.0)
MCH: 31.8 pg (ref 26.0–34.0)
MCHC: 33.2 g/dL (ref 30.0–36.0)
MCV: 96 fL (ref 80.0–100.0)
Monocytes Absolute: 0.6 10*3/uL (ref 0.1–1.0)
Monocytes Relative: 11 %
Neutro Abs: 3.1 10*3/uL (ref 1.7–7.7)
Neutrophils Relative %: 65 %
Platelets: 163 10*3/uL (ref 150–400)
RBC: 3.96 MIL/uL — ABNORMAL LOW (ref 4.22–5.81)
RDW: 13.2 % (ref 11.5–15.5)
WBC: 4.9 10*3/uL (ref 4.0–10.5)
nRBC: 0 % (ref 0.0–0.2)

## 2020-06-13 LAB — COMPREHENSIVE METABOLIC PANEL
ALT: 15 U/L (ref 0–44)
AST: 16 U/L (ref 15–41)
Albumin: 3.7 g/dL (ref 3.5–5.0)
Alkaline Phosphatase: 63 U/L (ref 38–126)
Anion gap: 6 (ref 5–15)
BUN: 13 mg/dL (ref 8–23)
CO2: 25 mmol/L (ref 22–32)
Calcium: 8.7 mg/dL — ABNORMAL LOW (ref 8.9–10.3)
Chloride: 104 mmol/L (ref 98–111)
Creatinine, Ser: 0.92 mg/dL (ref 0.61–1.24)
GFR, Estimated: >60 mL/min (ref >60)
Glucose, Bld: 81 mg/dL (ref 70–99)
Potassium: 3.7 mmol/L (ref 3.5–5.1)
Sodium: 135 mmol/L (ref 135–145)
Total Bilirubin: 0.9 mg/dL (ref 0.3–1.2)
Total Protein: 6.2 g/dL — ABNORMAL LOW (ref 6.5–8.1)

## 2020-06-13 LAB — URIC ACID: Uric Acid, Serum: 3.4 mg/dL — ABNORMAL LOW (ref 3.7–8.6)

## 2020-06-13 LAB — MAGNESIUM: Magnesium: 2 mg/dL (ref 1.7–2.4)

## 2020-06-13 LAB — LACTATE DEHYDROGENASE: LDH: 128 U/L (ref 98–192)

## 2020-06-13 LAB — PHOSPHORUS: Phosphorus: 4.2 mg/dL (ref 2.5–4.6)

## 2020-06-14 LAB — MISC LABCORP TEST (SEND OUT)

## 2020-06-20 ENCOUNTER — Other Ambulatory Visit (HOSPITAL_COMMUNITY): Payer: 59

## 2020-06-20 ENCOUNTER — Other Ambulatory Visit: Payer: Self-pay

## 2020-06-20 ENCOUNTER — Encounter (HOSPITAL_COMMUNITY): Payer: Self-pay | Admitting: Hematology

## 2020-06-20 ENCOUNTER — Ambulatory Visit (HOSPITAL_COMMUNITY)
Admission: RE | Admit: 2020-06-20 | Discharge: 2020-06-20 | Disposition: A | Discharge status: Home / Self Care | Payer: 59 | Source: Ambulatory Visit | Attending: Hematology | Admitting: Hematology

## 2020-06-20 DIAGNOSIS — C8300 Small cell B-cell lymphoma, unspecified site: Secondary | ICD-10-CM | POA: Diagnosis not present

## 2020-06-20 MED ORDER — FLUDEOXYGLUCOSE F - 18 (FDG) INJECTION
13.1500 | Freq: Once | INTRAVENOUS | Status: AC | PRN
  Administered 2020-06-20: 13.15 via INTRAVENOUS

## 2020-06-22 ENCOUNTER — Encounter (HOSPITAL_COMMUNITY): Payer: Self-pay | Admitting: Hematology

## 2020-06-22 ENCOUNTER — Other Ambulatory Visit: Payer: Self-pay

## 2020-06-22 ENCOUNTER — Inpatient Hospital Stay (HOSPITAL_BASED_OUTPATIENT_CLINIC_OR_DEPARTMENT_OTHER): Payer: 59 | Admitting: Hematology

## 2020-06-22 VITALS — BP 117/76 | HR 71 | Temp 97.2°F | Resp 18 | Wt 206.2 lb

## 2020-06-22 DIAGNOSIS — C9110 Chronic lymphocytic leukemia of B-cell type not having achieved remission: Secondary | ICD-10-CM | POA: Diagnosis not present

## 2020-06-22 DIAGNOSIS — C8300 Small cell B-cell lymphoma, unspecified site: Secondary | ICD-10-CM

## 2020-06-22 NOTE — Patient Instructions (Addendum)
[place] at [facility] Discharge Instructions  You were seen today by Dr. Delton Coombes. He went over your recent results and scans. Finish your current bottle of venetoclax and stop. You may stop taking Mobic once you stop taking venetoclax. Continue taking Bactrim through the end of December and stop. Stop taking the allopurinol. Dr. Delton Coombes will see you back in 3 months for labs and follow up.   Thank you for choosing [place] at [facility] to provide your oncology and hematology care.  To afford each patient quality time with our provider, please arrive at least 15 minutes before your scheduled appointment time.   If you have a lab appointment with the [facility] please come in thru the Main Entrance and check in at the main information desk  You need to re-schedule your appointment should you arrive 10 or more minutes late.  We strive to give you quality time with our providers, and arriving late affects you and other patients whose appointments are after yours.  Also, if you no show three or more times for appointments you may be dismissed from the clinic at the providers discretion.     Again, thank you for choosing [facility].  Our hope is that these requests will decrease the amount of time that you wait before being seen by our physicians.       _____________________________________________________________  Should you have questions after your visit to [facility], please contact our office at [phone] between the hours of 8:00 a.m. and 4:30 p.m.  Voicemails left after 4:00 p.m. will not be returned until the following business day.  For prescription refill requests, have your pharmacy contact our office and allow 72 hours.    Cancer Center Support Programs:   > Cancer Support Group  2nd Tuesday of the month 1pm-2pm, Journey Room

## 2020-06-22 NOTE — Progress Notes (Signed)
[place], [place] Rubio [id number]   CLINIC:  Medical Oncology/Hematology  PCP:  Orpah Greek, MD [address] / [place] [address]  [phone]  REASON FOR VISIT:  Follow-up for small lymphocytic lymphoma  PRIOR THERAPY: Obinutuzumab & venetoclax x 6 cycles from 06/09/2019 to 10/27/2019  CURRENT THERAPY: Venetoclax 200 mg daily  INTERVAL HISTORY:  [name], a 59 y.o. male, returns for routine follow-up for his small lymphocytic lymphoma. Evertt was last seen on 05/05/2020.  Today he reports feeling okay. He complains of ongoing pain in his left shoulder and right hip, but denies any new pains. He is taking venetoclax 200 mg daily and tolerating it well; he has 4 days left in his current bottle. He denies having any skin rashes.    REVIEW OF SYSTEMS:  Review of Systems  Constitutional: Positive for appetite change (75%) and fatigue (75%).  Musculoskeletal: Positive for arthralgias (L shoulder and R hip pain).  Skin: Negative for rash.  All other systems reviewed and are negative.   PAST MEDICAL/SURGICAL HISTORY:  Past Medical History:  Diagnosis Date  . CAD (coronary artery disease)   . GERD (gastroesophageal reflux disease)   . HTN (hypertension)   . Hypercholesterolemia   . MI (myocardial infarction) ([place]) 2003   Past Surgical History:  Procedure Laterality Date  . AXILLARY LYMPH NODE BIOPSY Left 04/27/2019   Procedure: AXILLARY LYMPH NODE BIOPSY;  Surgeon: Aviva Signs, MD;  Location: [facility];  Service: General;  Laterality: Left;  . BIOPSY  11/26/2018   Procedure: BIOPSY;  Surgeon: Daneil Dolin, MD;  Location: [facility] SUITE;  Service: Endoscopy;;  . CHOLECYSTECTOMY N/A 05/18/2019   Procedure: LAPAROSCOPIC CHOLECYSTECTOMY;  Surgeon: Aviva Signs, MD;  Location: [facility];  Service: General;  Laterality: N/A;  . CORONARY ANGIOPLASTY WITH STENT PLACEMENT     2003  . ESOPHAGOGASTRODUODENOSCOPY N/A 11/26/2018    mild erosive reflux esophagitis, gastric erythema. Negative H.pylori   . INGUINAL HERNIA REPAIR Right 04/27/2019   Procedure: HERNIA REPAIR INGUINAL ADULT;  Surgeon: Aviva Signs, MD;  Location: [facility];  Service: General;  Laterality: Right;  . KNEE SURGERY Right   . lumbar fusion with cage  07/2017  . NASAL SINUS SURGERY    . TONSILLECTOMY AND ADENOIDECTOMY      SOCIAL HISTORY:  Social History   Socioeconomic History  . Marital status: Divorced    Spouse name: Not on file  . Number of children: 2  . Years of education: Not on file  . Highest education level: Not on file  Occupational History  . 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 Smoking status: Current Every Day Smoker    Packs/day: 0.50    Years: 25.00    Pack years: 12.50    Types: Cigarettes  . Smokeless tobacco: Never Used  Vaping Use  . Vaping Use: Never used  Substance and Sexual Activity  . Alcohol use: Yes    Comment: occ  . Drug use: Never  . Sexual activity: Yes  Other Topics Concern  . Not on file  Social History Narrative  . Not on file   Social Determinants of Health   Financial Resource Strain: Low Risk   . Difficulty of Paying Living Expenses: Not hard at all  Food Insecurity: No Food Insecurity  . Worried About Charity fundraiser in the Last Year: Never true  . Ran  Out of Food in the Last Year: Never true  Transportation Needs: No Transportation Needs  . Lack of Transportation (Medical): No  . Lack of Transportation (Non-Medical): No  Physical Activity: Inactive  . Days of Exercise per Week: 0 days  . Minutes of Exercise per Session: 0 min  Stress: No Stress Concern Present  . Feeling of Stress : Not at all  Social Connections: Socially Isolated  . Frequency of Communication with Friends and Family: More than three times a week  . Frequency of Social Gatherings with Friends and Family: More than three times a week  . Attends Religious Services: Never    . Active Member of Clubs or Organizations: No  . Attends Archivist Meetings: Never  . Marital Status: Divorced  Human resources officer Violence: Not At Risk  . Fear of Current or Ex-Partner: No  . Emotionally Abused: No  . Physically Abused: No  . Sexually Abused: No    FAMILY HISTORY:  Family History  Problem Relation Age of Onset  . Colon polyps Father        thinks he may have had polyps, possibly in his 95s.   [name] Heart attack Father   . Dementia Mother   . Hypotension Mother   . Stroke Sister   . Diabetes Maternal Grandmother   . Stroke Maternal Grandfather   . Cancer Paternal Grandmother   . Congestive Heart Failure Paternal Grandfather   . Kidney cancer Daughter   . Colon cancer Neg Hx   . Pancreatic cancer Neg Hx   . Liver disease Neg Hx     CURRENT MEDICATIONS:  Current Outpatient Medications  Medication Sig Dispense Refill  . acetaminophen (TYLENOL 8 HOUR ARTHRITIS PAIN) 650 MG CR tablet Take 650 mg by mouth every 8 (eight) hours as needed for pain.    [name] aspirin EC 81 MG tablet Take 81 mg by mouth 3 (three) times a week. Pt takes M,W,F    . baclofen (LIORESAL) 10 MG tablet Take 10 mg by mouth 2 (two) times daily as needed for muscle spasms.     . carvedilol (COREG) 6.25 MG tablet Take 6.25 mg by mouth 2 (two) times daily.    . cimetidine (TAGAMET) 200 MG tablet Take 200 mg by mouth as needed.     [name] escitalopram (LEXAPRO) 20 MG tablet Take 20 mg by mouth daily.     [name] gabapentin (NEURONTIN) 100 MG capsule Take 100 mg by mouth 2 (two) times daily as needed (pain).     [name] lisinopril (PRINIVIL,ZESTRIL) 10 MG tablet Take 10 mg by mouth daily.     . nitroGLYCERIN (NITROSTAT) 0.4 MG SL tablet SMARTSIG:1 Tablet(s) Sublingual As Needed    . obinutuzumab in sodium chloride 0.9 % 250 mL Inject into the vein.    . pantoprazole (PROTONIX) 40 MG tablet Take 40 mg by mouth as needed.     . Pitavastatin Calcium (LIVALO) 4 MG TABS Take 4 mg by mouth daily.    . prasugrel  (EFFIENT) 10 MG TABS tablet Take 10 mg by mouth daily.    . VENCLEXTA 100 MG TABS TAKE 2 TABS(200 MG) BY MOUTH DAILY. 60 tablet 1   No current facility-administered medications for this visit.    ALLERGIES:  No Known Allergies  PHYSICAL EXAM:  Performance status (ECOG): 1 - Symptomatic but completely ambulatory  Vitals:   06/22/20 1113  BP: 117/76  Pulse: 71  Resp: 18  Temp: (!) 97.2 F (36.2 C)  SpO2:  100%   Wt Readings from Last 3 Encounters:  06/22/20 206 lb 3.2 oz (93.5 kg)  05/05/20 207 lb 4.8 oz (94 kg)  03/24/20 206 lb 6.4 oz (93.6 kg)   Physical Exam Vitals reviewed.  Constitutional:      Appearance: Normal appearance.  Cardiovascular:     Rate and Rhythm: Normal rate. Rhythm irregular.     Pulses: Normal pulses.     Heart sounds: Normal heart sounds.  Pulmonary:     Effort: Pulmonary effort is normal.     Breath sounds: Normal breath sounds.  Abdominal:     Palpations: Abdomen is soft. There is no hepatomegaly, splenomegaly or mass.     Tenderness: There is no abdominal tenderness.     Hernia: No hernia is present.  Musculoskeletal:     Right lower leg: No edema.     Left lower leg: No edema.  Lymphadenopathy:     Upper Body:     Right upper body: No supraclavicular, axillary or pectoral adenopathy.     Left upper body: No supraclavicular, axillary or pectoral adenopathy.  Neurological:     General: No focal deficit present.     Mental Status: He is alert and oriented to person, place, and time.  Psychiatric:        Mood and Affect: Mood normal.        Behavior: Behavior normal.     LABORATORY DATA:  I have reviewed the labs as listed.  CBC Latest Ref Rng & Units 06/13/2020 05/05/2020 03/24/2020  WBC 4.0 - 10.5 K/uL 4.9 4.7 7.3  Hemoglobin 13.0 - 17.0 g/dL 12.6(L) 11.9(L) 13.5  Hematocrit 39 - 52 % 38.0(L) 35.3(L) 39.8  Platelets 150 - 400 K/uL 163 177 191   CMP Latest Ref Rng & Units 06/13/2020 05/05/2020 03/24/2020  Glucose 70 - 99 mg/dL 81 89  89  BUN 8 - 23 mg/dL 13 13 28(H)  Creatinine 0.61 - 1.24 mg/dL 0.92 0.89 1.13  Sodium 135 - 145 mmol/L 135 136 133(L)  Potassium 3.5 - 5.1 mmol/L 3.7 4.0 4.1  Chloride 98 - 111 mmol/L 104 105 102  CO2 22 - 32 mmol/L 25 23 23   Calcium 8.9 - 10.3 mg/dL 8.7(L) 8.6(L) 9.0  Total Protein 6.5 - 8.1 g/dL 6.2(L) 6.1(L) 6.8  Total Bilirubin 0.3 - 1.2 mg/dL 0.9 0.8 0.8  Alkaline Phos 38 - 126 U/L 63 52 64  AST 15 - 41 U/L 16 17 20   ALT 0 - 44 U/L 15 15 18       Component Value Date/Time   RBC 3.96 (L) 06/13/2020 1104   MCV 96.0 06/13/2020 1104   MCH 31.8 06/13/2020 1104   MCHC 33.2 06/13/2020 1104   RDW 13.2 06/13/2020 1104   LYMPHSABS 1.2 06/13/2020 1104   MONOABS 0.6 06/13/2020 1104   EOSABS 0.0 06/13/2020 1104   BASOSABS 0.0 06/13/2020 1104   Lab Results  Component Value Date   LDH 128 06/13/2020   LDH 141 05/05/2020   LDH 153 03/24/2020    DIAGNOSTIC IMAGING:  I have independently reviewed the scans and discussed with the patient. NM PET Image Restag (PS) Skull Base To Thigh  Result Date: 06/21/2020 CLINICAL DATA:  Subsequent treatment strategy for lymphoma. EXAM: NUCLEAR MEDICINE PET SKULL BASE TO THIGH TECHNIQUE: 13.2 mCi F-18 FDG was injected intravenously. Full-ring PET imaging was performed from the skull base to thigh after the radiotracer. CT data was obtained and used for attenuation correction and anatomic localization. Fasting blood glucose:  85 mg/dl COMPARISON:  11/23/2019 FINDINGS: Mediastinal blood pool activity: SUV max 2.3 Liver activity: SUV max NA NECK: No hypermetabolic cervical lymphadenopathy. Incidental CT findings: none CHEST: No hypermetabolic thoracic lymphadenopathy. Status post left axillary lymph node dissection. No suspicious pulmonary nodules. Incidental CT findings: Three vessel coronary atherosclerosis. ABDOMEN/PELVIS: No hypermetabolic abdominopelvic lymphadenopathy. Spleen is normal in size.  No associated hypermetabolism. No abnormal metabolism in the  liver, pancreas, or adrenal glands. Incidental CT findings: Status post cholecystectomy. Atherosclerotic calcifications the abdominal aorta and branch vessels. Mildly thick-walled bladder, although underdistended. Mild fat in the left inguinal canal. SKELETON: No focal hypermetabolic activity to suggest skeletal metastasis. Incidental CT findings: Postsurgical changes involving the lumbar spine. IMPRESSION: No findings suspicious for active lymphoma. Electronically Signed   By: Julian Hy M.D.   On: 06/21/2020 12:21     ASSESSMENT:  1. Clinical stage IIIb small lymphocytic lymphoma: -6 cycles of obinutuzumab and venetoclax from 06/09/2019 through 10/27/2019. -PET scan on 11/23/2019 showed complete metabolic response to therapy of lymphoma. Slight hypermetabolism in the right inguinal region from hernia surgery. New hypermetabolic soft tissue fullness about the ischial tuberosity, possibly related to hamstring origin injury. -He is currently on venetoclax 200 mg daily, dose reduced secondary to carvedilol. -PET scan on June 20, 2020 with no findings suspicious for active lymphoma.  2. CAD: -Cardiac catheterization on 10/01/2019 found to have 100% occlusion of the RCA. -Angioplasty and stenting of the circumflex was done. -He was started on Effient and Livalo. -Echocardiogram on 01/18/2020 by Dr. Sabra Heck in [place] LVEF 50-55% with mild global hypokinesia, moderate LVH, normal RV function, stage I diastolic dysfunction.  3. Right posterior hip pain: -MRI of the pelvis without contrast on 01/10/2020 shows complete tear of the right conjoined semitendinosus and biceps femoris tendon at the ischial tuberosity with 2.2 cm retraction. High-grade partial tear of the right semimembranosus tendon at the ischial tuberosity. -He was evaluated by Dr. Mardelle Matte and conservative management recommended.   PLAN:  1. Clinical stage IIIb small lymphocytic lymphoma: -He is taking venetoclax 200 mg  daily.  He has musculoskeletal pains from it. -Reviewed labs which showed normal white count with mild anemia and normal platelet count.  LDH was normal.  LFTs are normal. -Reviewed PET scan from June 20, 2020 which did not show any evidence of recurrence. -Recommend discontinuing venetoclax after finishing the current bottle. -We have also reviewed flow cytometry which did show very minimal positive CLL cells. -RTC 3 months with repeat labs.  2. Right posterior hip pain: -Conservative management per Dr. Mardelle Matte.  He still has some pain.  3. Musculoskeletal pains: -He will continue Mobic 7.5 mg daily until joint pains resolve after discontinuation of venetoclax.  4. ID prophylaxis: -Continue Bactrim 3 times a week for 1 month before discontinuing it.  Could not tolerate acyclovir.  Orders placed this encounter:  No orders of the defined types were placed in this encounter.    Derek Jack, MD [facility] [phone]   I, Milinda Antis, am acting as a scribe for Dr. Sanda Linger.  I, Derek Jack MD, have reviewed the above documentation for accuracy and completeness, and I agree with the above.

## 2020-09-26 ENCOUNTER — Other Ambulatory Visit: Payer: Self-pay

## 2020-09-26 ENCOUNTER — Inpatient Hospital Stay (HOSPITAL_COMMUNITY): Payer: 59 | Attending: Hematology

## 2020-09-26 DIAGNOSIS — C8300 Small cell B-cell lymphoma, unspecified site: Secondary | ICD-10-CM | POA: Diagnosis present

## 2020-09-26 LAB — CBC WITH DIFFERENTIAL/PLATELET
Abs Immature Granulocytes: 0.02 10*3/uL (ref 0.00–0.07)
Basophils Absolute: 0.1 10*3/uL (ref 0.0–0.1)
Basophils Relative: 1 %
Eosinophils Absolute: 0.1 10*3/uL (ref 0.0–0.5)
Eosinophils Relative: 2 %
HCT: 38.9 % — ABNORMAL LOW (ref 39.0–52.0)
Hemoglobin: 13 g/dL (ref 13.0–17.0)
Immature Granulocytes: 0 %
Lymphocytes Relative: 21 %
Lymphs Abs: 1.1 10*3/uL (ref 0.7–4.0)
MCH: 31.5 pg (ref 26.0–34.0)
MCHC: 33.4 g/dL (ref 30.0–36.0)
MCV: 94.2 fL (ref 80.0–100.0)
Monocytes Absolute: 0.3 10*3/uL (ref 0.1–1.0)
Monocytes Relative: 7 %
Neutro Abs: 3.5 10*3/uL (ref 1.7–7.7)
Neutrophils Relative %: 69 %
Platelets: 177 10*3/uL (ref 150–400)
RBC: 4.13 MIL/uL — ABNORMAL LOW (ref 4.22–5.81)
RDW: 12.4 % (ref 11.5–15.5)
WBC: 5.1 10*3/uL (ref 4.0–10.5)
nRBC: 0 % (ref 0.0–0.2)

## 2020-09-26 LAB — COMPREHENSIVE METABOLIC PANEL
ALT: 14 U/L (ref 0–44)
AST: 18 U/L (ref 15–41)
Albumin: 3.7 g/dL (ref 3.5–5.0)
Alkaline Phosphatase: 47 U/L (ref 38–126)
Anion gap: 10 (ref 5–15)
BUN: 14 mg/dL (ref 8–23)
CO2: 24 mmol/L (ref 22–32)
Calcium: 9.1 mg/dL (ref 8.9–10.3)
Chloride: 103 mmol/L (ref 98–111)
Creatinine, Ser: 0.72 mg/dL (ref 0.61–1.24)
GFR, Estimated: >60 mL/min (ref >60)
Glucose, Bld: 99 mg/dL (ref 70–99)
Potassium: 3.6 mmol/L (ref 3.5–5.1)
Sodium: 137 mmol/L (ref 135–145)
Total Bilirubin: 0.6 mg/dL (ref 0.3–1.2)
Total Protein: 6.1 g/dL — ABNORMAL LOW (ref 6.5–8.1)

## 2020-09-26 LAB — LACTATE DEHYDROGENASE: LDH: 152 U/L (ref 98–192)

## 2020-09-26 LAB — URIC ACID: Uric Acid, Serum: 4.3 mg/dL (ref 3.7–8.6)

## 2020-10-03 ENCOUNTER — Other Ambulatory Visit: Payer: Self-pay

## 2020-10-03 ENCOUNTER — Inpatient Hospital Stay (HOSPITAL_COMMUNITY): Payer: 59 | Attending: Hematology | Admitting: Hematology

## 2020-10-03 ENCOUNTER — Encounter (HOSPITAL_COMMUNITY): Payer: Self-pay | Admitting: Hematology

## 2020-10-03 VITALS — BP 109/78 | HR 69 | Temp 97.3°F | Resp 17 | Wt 193.0 lb

## 2020-10-03 DIAGNOSIS — Z8051 Family history of malignant neoplasm of kidney: Secondary | ICD-10-CM | POA: Diagnosis not present

## 2020-10-03 DIAGNOSIS — Z79899 Other long term (current) drug therapy: Secondary | ICD-10-CM | POA: Insufficient documentation

## 2020-10-03 DIAGNOSIS — Z7982 Long term (current) use of aspirin: Secondary | ICD-10-CM | POA: Diagnosis not present

## 2020-10-03 DIAGNOSIS — F17210 Nicotine dependence, cigarettes, uncomplicated: Secondary | ICD-10-CM | POA: Insufficient documentation

## 2020-10-03 DIAGNOSIS — Z8572 Personal history of non-Hodgkin lymphomas: Secondary | ICD-10-CM | POA: Insufficient documentation

## 2020-10-03 DIAGNOSIS — Z9221 Personal history of antineoplastic chemotherapy: Secondary | ICD-10-CM | POA: Insufficient documentation

## 2020-10-03 DIAGNOSIS — Z8249 Family history of ischemic heart disease and other diseases of the circulatory system: Secondary | ICD-10-CM | POA: Insufficient documentation

## 2020-10-03 DIAGNOSIS — M25551 Pain in right hip: Secondary | ICD-10-CM | POA: Insufficient documentation

## 2020-10-03 DIAGNOSIS — C8300 Small cell B-cell lymphoma, unspecified site: Secondary | ICD-10-CM

## 2020-10-03 DIAGNOSIS — I10 Essential (primary) hypertension: Secondary | ICD-10-CM | POA: Diagnosis not present

## 2020-10-03 DIAGNOSIS — Z833 Family history of diabetes mellitus: Secondary | ICD-10-CM | POA: Insufficient documentation

## 2020-10-03 DIAGNOSIS — I2510 Atherosclerotic heart disease of native coronary artery without angina pectoris: Secondary | ICD-10-CM | POA: Insufficient documentation

## 2020-10-03 DIAGNOSIS — I252 Old myocardial infarction: Secondary | ICD-10-CM | POA: Insufficient documentation

## 2020-10-03 DIAGNOSIS — Z809 Family history of malignant neoplasm, unspecified: Secondary | ICD-10-CM | POA: Insufficient documentation

## 2020-10-03 NOTE — Patient Instructions (Signed)
[place] at [facility] Discharge Instructions  You were seen today by Dr. Delton Coombes. He went over your recent results and scans. Purchase Boost/Ensure Clear (or [employer]) and drink 2-3 cans per day to improve your weight and energy levels. Dr. Delton Coombes will see you back in 3 months for labs and follow up.   Thank you for choosing [place] at [facility] to provide your oncology and hematology care.  To afford each patient quality time with our provider, please arrive at least 15 minutes before your scheduled appointment time.   If you have a lab appointment with the [facility] please come in thru the Main Entrance and check in at the main information desk  You need to re-schedule your appointment should you arrive 10 or more minutes late.  We strive to give you quality time with our providers, and arriving late affects you and other patients whose appointments are after yours.  Also, if you no show three or more times for appointments you may be dismissed from the clinic at the providers discretion.     Again, thank you for choosing [facility].  Our hope is that these requests will decrease the amount of time that you wait before being seen by our physicians.       _____________________________________________________________  Should you have questions after your visit to [facility], please contact our office at [phone] between the hours of 8:00 a.m. and 4:30 p.m.  Voicemails left after 4:00 p.m. will not be returned until the following business day.  For prescription refill requests, have your pharmacy contact our office and allow 72 hours.    Cancer Center Support Programs:   > Cancer Support Group  2nd Tuesday of the month 1pm-2pm, Journey Room

## 2020-10-03 NOTE — Progress Notes (Signed)
[place], [place] [address]   CLINIC:  Medical Oncology/Hematology  PCP:  Orpah Greek, MD [address] / [place] [address]  [phone]  REASON FOR VISIT:  Follow-up for small lymphocytic lymphoma  PRIOR THERAPY:  1. Obinutuzumab & venetoclax x 6 cycles from 06/09/2019 to 10/27/2019. 2. Venetoclax 200 mg through 06/2020.  CURRENT THERAPY: Surveillance  INTERVAL HISTORY:  [name], a 60 y.o. male, returns for routine follow-up for his small lymphocytic lymphoma. Patrick Rubio was last seen on 06/22/2020.  Today he reports feeling okay. He notes that his appetite is decreased though he eats when he wants to, a couple times per day. He cannot tolerate Boost or Ensure since it causes him to have diarrhea. He stopped taking venetoclax in December. His hip pain has decreased.   REVIEW OF SYSTEMS:  Review of Systems  Constitutional: Positive for appetite change (25%), fatigue (50%) and unexpected weight change (lost 13 lbs in 4 months).  Musculoskeletal: Positive for arthralgias (hip pain decreased).  All other systems reviewed and are negative.   PAST MEDICAL/SURGICAL HISTORY:  Past Medical History:  Diagnosis Date  . CAD (coronary artery disease)   . GERD (gastroesophageal reflux disease)   . HTN (hypertension)   . Hypercholesterolemia   . MI (myocardial infarction) ([place]) 2003   Past Surgical History:  Procedure Laterality Date  . AXILLARY LYMPH NODE BIOPSY Left 04/27/2019   Procedure: AXILLARY LYMPH NODE BIOPSY;  Surgeon: Aviva Signs, MD;  Location: [facility];  Service: General;  Laterality: Left;  . BIOPSY  11/26/2018   Procedure: BIOPSY;  Surgeon: Daneil Dolin, MD;  Location: [facility];  Service: Endoscopy;;  . CHOLECYSTECTOMY N/A 05/18/2019   Procedure: LAPAROSCOPIC CHOLECYSTECTOMY;  Surgeon: Aviva Signs, MD;  Location: [facility];  Service: General;  Laterality: N/A;  . CORONARY ANGIOPLASTY WITH STENT  PLACEMENT     2003  . ESOPHAGOGASTRODUODENOSCOPY N/A 11/26/2018   mild erosive reflux esophagitis, gastric erythema. Negative H.pylori   . INGUINAL HERNIA REPAIR Right 04/27/2019   Procedure: HERNIA REPAIR INGUINAL ADULT;  Surgeon: Aviva Signs, MD;  Location: [facility];  Service: General;  Laterality: Right;  . KNEE SURGERY Right   . lumbar fusion with cage  07/2017  . NASAL SINUS SURGERY    . TONSILLECTOMY AND ADENOIDECTOMY      SOCIAL HISTORY:  Social History   Socioeconomic History  . Marital status: Divorced    Spouse name: Not on file  . Number of children: 2  . Years of education: Not on file  . Highest education level: Not on file  Occupational History  . 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 Smoking status: Current Every Day Smoker    Packs/day: 0.50    Years: 25.00    Pack years: 12.50    Types: Cigarettes  . Smokeless tobacco: Never Used  Vaping Use  . Vaping Use: Never used  Substance and Sexual Activity  . Alcohol use: Yes    Comment: occ  . Drug use: Never  . Sexual activity: Yes  Other Topics Concern  . Not on file  Social History Narrative  . Not on file   Social Determinants of Health   Financial Resource Strain: Low Risk   . Difficulty of Paying Living Expenses: Not hard at all  Food Insecurity: No Food Insecurity  . Worried About Charity fundraiser in the Last Year:  Never true  . Ran Out of Food in the Last Year: Never true  Transportation Needs: No Transportation Needs  . Lack of Transportation (Medical): No  . Lack of Transportation (Non-Medical): No  Physical Activity: Inactive  . Days of Exercise per Week: 0 days  . Minutes of Exercise per Session: 0 min  Stress: No Stress Concern Present  . Feeling of Stress : Not at all  Social Connections: Socially Isolated  . Frequency of Communication with Friends and Family: More than three times a week  . Frequency of Social Gatherings with Friends and Family:  More than three times a week  . Attends Religious Services: Never  . Active Member of Clubs or Organizations: No  . Attends Archivist Meetings: Never  . Marital Status: Divorced  Human resources officer Violence: Not At Risk  . Fear of Current or Ex-Partner: No  . Emotionally Abused: No  . Physically Abused: No  . Sexually Abused: No    FAMILY HISTORY:  Family History  Problem Relation Age of Onset  . Colon polyps Father        thinks he may have had polyps, possibly in his 64s.   [name] Heart attack Father   . Dementia Mother   . Hypotension Mother   . Stroke Sister   . Diabetes Maternal Grandmother   . Stroke Maternal Grandfather   . Cancer Paternal Grandmother   . Congestive Heart Failure Paternal Grandfather   . Kidney cancer Daughter   . Colon cancer Neg Hx   . Pancreatic cancer Neg Hx   . Liver disease Neg Hx     CURRENT MEDICATIONS:  Current Outpatient Medications  Medication Sig Dispense Refill  . acetaminophen (TYLENOL 8 HOUR ARTHRITIS PAIN) 650 MG CR tablet Take 650 mg by mouth every 8 (eight) hours as needed for pain.    [name] aspirin EC 81 MG tablet Take 81 mg by mouth 3 (three) times a week. Pt takes M,W,F    . carvedilol (COREG) 6.25 MG tablet Take 6.25 mg by mouth 2 (two) times daily.    . cimetidine (TAGAMET) 200 MG tablet Take 200 mg by mouth as needed.     [name] escitalopram (LEXAPRO) 20 MG tablet Take 20 mg by mouth daily.     [name] lisinopril (PRINIVIL,ZESTRIL) 10 MG tablet Take 10 mg by mouth daily.     . nitroGLYCERIN (NITROSTAT) 0.4 MG SL tablet SMARTSIG:1 Tablet(s) Sublingual As Needed    . pantoprazole (PROTONIX) 40 MG tablet Take 40 mg by mouth as needed.     . Pitavastatin Calcium (LIVALO) 4 MG TABS Take 4 mg by mouth daily.    . prasugrel (EFFIENT) 10 MG TABS tablet Take 10 mg by mouth daily.     No current facility-administered medications for this visit.    ALLERGIES:  No Known Allergies  PHYSICAL EXAM:  Performance status (ECOG): 1 - Symptomatic  but completely ambulatory  Vitals:   10/03/20 0810  BP: 109/78  Pulse: 69  Resp: 17  Temp: (!) 97.3 F (36.3 C)  SpO2: 99%   Wt Readings from Last 3 Encounters:  10/03/20 193 lb (87.5 kg)  06/22/20 206 lb 3.2 oz (93.5 kg)  05/05/20 207 lb 4.8 oz (94 kg)   Physical Exam Vitals reviewed.  Constitutional:      Appearance: Normal appearance.  Cardiovascular:     Rate and Rhythm: Normal rate and regular rhythm.     Pulses: Normal pulses.     Heart sounds:  Normal heart sounds.  Pulmonary:     Effort: Pulmonary effort is normal.     Breath sounds: Normal breath sounds.  Chest:  Breasts:     Right: No supraclavicular adenopathy.     Left: No supraclavicular adenopathy.    Abdominal:     Palpations: Abdomen is soft. There is no hepatomegaly, splenomegaly or mass.     Tenderness: There is no abdominal tenderness.     Hernia: No hernia is present.  Musculoskeletal:     Right lower leg: No edema.     Left lower leg: No edema.  Lymphadenopathy:     Cervical: No cervical adenopathy.     Upper Body:     Right upper body: No supraclavicular adenopathy.     Left upper body: No supraclavicular adenopathy.     Lower Body: No right inguinal adenopathy. No left inguinal adenopathy.  Neurological:     General: No focal deficit present.     Mental Status: He is alert and oriented to person, place, and time.  Psychiatric:        Mood and Affect: Mood normal.        Behavior: Behavior normal.     LABORATORY DATA:  I have reviewed the labs as listed.  CBC Latest Ref Rng & Units 09/26/2020 06/13/2020 05/05/2020  WBC 4.0 - 10.5 K/uL 5.1 4.9 4.7  Hemoglobin 13.0 - 17.0 g/dL 13.0 12.6(L) 11.9(L)  Hematocrit 39.0 - 52.0 % 38.9(L) 38.0(L) 35.3(L)  Platelets 150 - 400 K/uL 177 163 177   CMP Latest Ref Rng & Units 09/26/2020 06/13/2020 05/05/2020  Glucose 70 - 99 mg/dL 99 81 89  BUN 8 - 23 mg/dL 14 13 13   Creatinine 0.61 - 1.24 mg/dL 0.72 0.92 0.89  Sodium 135 - 145 mmol/L 137 135 136   Potassium 3.5 - 5.1 mmol/L 3.6 3.7 4.0  Chloride 98 - 111 mmol/L 103 104 105  CO2 22 - 32 mmol/L 24 25 23   Calcium 8.9 - 10.3 mg/dL 9.1 8.7(L) 8.6(L)  Total Protein 6.5 - 8.1 g/dL 6.1(L) 6.2(L) 6.1(L)  Total Bilirubin 0.3 - 1.2 mg/dL 0.6 0.9 0.8  Alkaline Phos 38 - 126 U/L 47 63 52  AST 15 - 41 U/L 18 16 17   ALT 0 - 44 U/L 14 15 15       Component Value Date/Time   RBC 4.13 (L) 09/26/2020 0826   MCV 94.2 09/26/2020 0826   MCH 31.5 09/26/2020 0826   MCHC 33.4 09/26/2020 0826   RDW 12.4 09/26/2020 0826   LYMPHSABS 1.1 09/26/2020 0826   MONOABS 0.3 09/26/2020 0826   EOSABS 0.1 09/26/2020 0826   BASOSABS 0.1 09/26/2020 0826   Lab Results  Component Value Date   LDH 152 09/26/2020   LDH 128 06/13/2020   LDH 141 05/05/2020    DIAGNOSTIC IMAGING:  I have independently reviewed the scans and discussed with the patient. No results found.   ASSESSMENT:  1. Clinical stage IIIb small lymphocytic lymphoma: -6 cycles of obinutuzumab and venetoclax from 06/09/2019 through 10/27/2019. -PET scan on 11/23/2019 showed complete metabolic response to therapy of lymphoma. Slight hypermetabolism in the right inguinal region from hernia surgery. New hypermetabolic soft tissue fullness about the ischial tuberosity, possibly related to hamstring origin injury. -Venetoclax was dose reduced to 200 mg daily secondary to carvedilol. -PET scan on June 20, 2020 with no findings suspicious for active lymphoma. -Venetoclax discontinued in December 2021.  2. CAD: -Cardiac catheterization on 10/01/2019 found to have 100% occlusion of  the RCA. -Angioplasty and stenting of the circumflex was done. -He was started on Effient and Livalo. -Echocardiogram on 01/18/2020 by Dr. Sabra Heck in [place] LVEF 50-55% with mild global hypokinesia, moderate LVH, normal RV function, stage I diastolic dysfunction.  3. Right posterior hip pain: -MRI of the pelvis without contrast on 01/10/2020 shows complete tear of the  right conjoined semitendinosus and biceps femoris tendon at the ischial tuberosity with 2.2 cm retraction. High-grade partial tear of the right semimembranosus tendon at the ischial tuberosity. -He was evaluated by Dr. Mardelle Matte and conservative management recommended.   PLAN:  1. Clinical stage IIIb small lymphocytic lymphoma: -PET scan on 06/20/2020 did not show any evidence of lymphoma. -MRD flow cytometry on 06/13/2020 showed extremely small abnormal B-cell population, less than 0.01% of total cells. -Venetoclax was discontinued in December 2021. -He does not have any B symptoms.  No recurrent infections. -However he lost 13 pounds since last visit.  He reports that he has been eating less quantities.  He eats about 2 meals per day. -Recommend drinking boost clear as he cannot tolerate regular boost or Ensure. -I plan to see him back in 3 months for follow-up.  If he continues to lose weight, will consider reimaging. -I have reviewed labs from 09/18/2020 which showed normal LDH and CBC.  No palpable adenopathy or splenomegaly.  2. Right posterior hip pain: -Conservative management recommended by Dr. Mardelle Matte.  He is has occasional pain which is tolerable.  Is not requiring pain medication.  3. Musculoskeletal pains: -He is no longer needing Mobic as his pain subsided after discontinuing venetoclax.  4. ID prophylaxis: -Bactrim was discontinued.  Orders placed this encounter:  No orders of the defined types were placed in this encounter.    Derek Jack, MD [facility] [phone]   I, Milinda Antis, am acting as a scribe for Dr. Sanda Linger.  I, Derek Jack MD, have reviewed the above documentation for accuracy and completeness, and I agree with the above.

## 2020-10-04 ENCOUNTER — Other Ambulatory Visit (HOSPITAL_COMMUNITY): Payer: Self-pay

## 2020-10-04 DIAGNOSIS — C8300 Small cell B-cell lymphoma, unspecified site: Secondary | ICD-10-CM

## 2021-01-10 ENCOUNTER — Other Ambulatory Visit: Payer: Self-pay

## 2021-01-10 ENCOUNTER — Inpatient Hospital Stay (HOSPITAL_COMMUNITY): Payer: 59 | Attending: Hematology

## 2021-01-10 DIAGNOSIS — Z79899 Other long term (current) drug therapy: Secondary | ICD-10-CM | POA: Insufficient documentation

## 2021-01-10 DIAGNOSIS — F17210 Nicotine dependence, cigarettes, uncomplicated: Secondary | ICD-10-CM | POA: Diagnosis not present

## 2021-01-10 DIAGNOSIS — C8300 Small cell B-cell lymphoma, unspecified site: Secondary | ICD-10-CM

## 2021-01-10 DIAGNOSIS — Z8572 Personal history of non-Hodgkin lymphomas: Secondary | ICD-10-CM | POA: Diagnosis not present

## 2021-01-10 DIAGNOSIS — Z9221 Personal history of antineoplastic chemotherapy: Secondary | ICD-10-CM | POA: Insufficient documentation

## 2021-01-10 LAB — COMPREHENSIVE METABOLIC PANEL
ALT: 12 U/L (ref 0–44)
AST: 13 U/L — ABNORMAL LOW (ref 15–41)
Albumin: 3.4 g/dL — ABNORMAL LOW (ref 3.5–5.0)
Alkaline Phosphatase: 50 U/L (ref 38–126)
Anion gap: 5 (ref 5–15)
BUN: 15 mg/dL (ref 8–23)
CO2: 27 mmol/L (ref 22–32)
Calcium: 8.8 mg/dL — ABNORMAL LOW (ref 8.9–10.3)
Chloride: 105 mmol/L (ref 98–111)
Creatinine, Ser: 0.64 mg/dL (ref 0.61–1.24)
GFR, Estimated: >60 mL/min (ref >60)
Glucose, Bld: 82 mg/dL (ref 70–99)
Potassium: 3.6 mmol/L (ref 3.5–5.1)
Sodium: 137 mmol/L (ref 135–145)
Total Bilirubin: 0.8 mg/dL (ref 0.3–1.2)
Total Protein: 5.7 g/dL — ABNORMAL LOW (ref 6.5–8.1)

## 2021-01-10 LAB — CBC WITH DIFFERENTIAL/PLATELET
Abs Immature Granulocytes: 0.03 10*3/uL (ref 0.00–0.07)
Basophils Absolute: 0 10*3/uL (ref 0.0–0.1)
Basophils Relative: 1 %
Eosinophils Absolute: 0.1 10*3/uL (ref 0.0–0.5)
Eosinophils Relative: 1 %
HCT: 37 % — ABNORMAL LOW (ref 39.0–52.0)
Hemoglobin: 12.3 g/dL — ABNORMAL LOW (ref 13.0–17.0)
Immature Granulocytes: 0 %
Lymphocytes Relative: 23 %
Lymphs Abs: 1.6 10*3/uL (ref 0.7–4.0)
MCH: 31.4 pg (ref 26.0–34.0)
MCHC: 33.2 g/dL (ref 30.0–36.0)
MCV: 94.4 fL (ref 80.0–100.0)
Monocytes Absolute: 0.6 10*3/uL (ref 0.1–1.0)
Monocytes Relative: 9 %
Neutro Abs: 4.5 10*3/uL (ref 1.7–7.7)
Neutrophils Relative %: 66 %
Platelets: 164 10*3/uL (ref 150–400)
RBC: 3.92 MIL/uL — ABNORMAL LOW (ref 4.22–5.81)
RDW: 13.2 % (ref 11.5–15.5)
WBC: 6.8 10*3/uL (ref 4.0–10.5)
nRBC: 0 % (ref 0.0–0.2)

## 2021-01-10 LAB — LACTATE DEHYDROGENASE: LDH: 121 U/L (ref 98–192)

## 2021-01-17 ENCOUNTER — Ambulatory Visit (HOSPITAL_COMMUNITY): Payer: 59 | Admitting: Hematology

## 2021-02-06 ENCOUNTER — Ambulatory Visit (HOSPITAL_COMMUNITY): Payer: 59 | Admitting: Hematology

## 2021-02-08 ENCOUNTER — Encounter (HOSPITAL_COMMUNITY): Payer: Self-pay | Admitting: Hematology and Oncology

## 2021-02-08 ENCOUNTER — Inpatient Hospital Stay (HOSPITAL_COMMUNITY): Payer: 59 | Attending: Hematology | Admitting: Hematology and Oncology

## 2021-02-08 ENCOUNTER — Other Ambulatory Visit: Payer: Self-pay

## 2021-02-08 VITALS — BP 120/79 | HR 62 | Temp 96.8°F | Resp 18 | Wt 199.2 lb

## 2021-02-08 DIAGNOSIS — C8300 Small cell B-cell lymphoma, unspecified site: Secondary | ICD-10-CM | POA: Diagnosis not present

## 2021-02-08 DIAGNOSIS — Z79899 Other long term (current) drug therapy: Secondary | ICD-10-CM | POA: Insufficient documentation

## 2021-02-08 DIAGNOSIS — K9089 Other intestinal malabsorption: Secondary | ICD-10-CM | POA: Diagnosis not present

## 2021-02-08 DIAGNOSIS — D638 Anemia in other chronic diseases classified elsewhere: Secondary | ICD-10-CM | POA: Diagnosis not present

## 2021-02-08 DIAGNOSIS — Z8572 Personal history of non-Hodgkin lymphomas: Secondary | ICD-10-CM | POA: Diagnosis not present

## 2021-02-08 NOTE — Progress Notes (Signed)
[name] progress notes  Patient Care Team: Orpah Greek, MD as PCP - General (Internal Medicine) Gala Romney, [name], MD as Consulting Physician (Gastroenterology) Derek Jack, MD as Consulting Physician (Hematology and Oncology)  CHIEF COMPLAINTS/PURPOSE OF VISIT:  SLL, for further evaluation  HISTORY OF PRESENTING ILLNESS:  Patrick Rubio 60 y.o. male was seen because his primary oncologist is not available The patient is currently on treatment break He stopped treatment in November due to multiple side effects He complained of profound postprandial diarrhea, usually less than an hour after eating He has noted abdominal bloating as well as intermittent rectal bleeding  I reviewed the patient's records extensive and collaborated the history with the patient. Summary of his history is as follows: Oncology History  Small lymphocytic lymphoma ([facility])  05/04/2019 Initial Diagnosis   Small lymphocytic lymphoma ([facility])    06/09/2019 -  Chemotherapy   The patient had obinutuzumab (GAZYVA) 100 mg in sodium chloride 0.9 % 100 mL (0.9615 mg/mL) chemo infusion, 100 mg, Intravenous, Once, 6 of 6 cycles Administration: 100 mg (06/09/2019), 900 mg (06/10/2019), 1,000 mg (06/16/2019), 1,000 mg (07/07/2019), 1,000 mg (06/23/2019), 1,000 mg (08/04/2019), 1,000 mg (09/01/2019), 1,000 mg (09/29/2019), 1,000 mg (10/27/2019)   for chemotherapy treatment.       MEDICAL HISTORY:  Past Medical History:  Diagnosis Date   CAD (coronary artery disease)    GERD (gastroesophageal reflux disease)    HTN (hypertension)    Hypercholesterolemia    MI (myocardial infarction) ([facility]) 2003    SURGICAL HISTORY: Past Surgical History:  Procedure Laterality Date   AXILLARY LYMPH NODE BIOPSY Left 04/27/2019   Procedure: AXILLARY LYMPH NODE BIOPSY;  Surgeon: Aviva Signs, MD;  Location: [facility];  Service: General;  Laterality: Left;   BIOPSY  11/26/2018   Procedure: BIOPSY;  Surgeon: Daneil Dolin, MD;  Location: [facility];  Service: Endoscopy;;   CHOLECYSTECTOMY N/A 05/18/2019   Procedure: LAPAROSCOPIC CHOLECYSTECTOMY;  Surgeon: Aviva Signs, MD;  Location: [facility];  Service: General;  Laterality: N/A;   CORONARY ANGIOPLASTY WITH STENT PLACEMENT     2003   ESOPHAGOGASTRODUODENOSCOPY N/A 11/26/2018   mild erosive reflux esophagitis, gastric erythema. 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  Tobacco Use   Smoking status: Every Day    Packs/day: 0.50    Years: 25.00    Pack years: 12.50    Types: Cigarettes   Smokeless tobacco: Never  Vaping Use   Vaping Use: Never used  Substance and Sexual Activity   Alcohol use: Yes    Comment: occ   Drug use: Never   Sexual activity: Yes  Other Topics Concern   Not on file  Social History Narrative   Not on file   Social Determinants of Health   Financial Resource Strain: Low Risk  Difficulty of Paying Living Expenses: Not hard at all  Food Insecurity: No Food Insecurity   Worried About [place] in the Last Year: Never true   Ran Out of Food in the Last Year: Never true  Transportation Needs: No Transportation Needs   Lack of Transportation (Medical): No   Lack of Transportation (Non-Medical): No  Physical Activity: Inactive   Days of Exercise per Week: 0 days   Minutes of Exercise per Session: 0 min  Stress: No Stress Concern Present    Feeling of Stress : Not at all  Social Connections: Socially Isolated   Frequency of Communication with Friends and Family: More than three times a week   Frequency of Social Gatherings with Friends and Family: More than three times a week   Attends Religious Services: Never   Marine scientist or Organizations: No   Attends Music therapist: Never   Marital Status: Divorced  Human resources officer Violence: Not At Risk   Fear of Current or Ex-Partner: No   Emotionally Abused: No   Physically Abused: No   Sexually Abused: No    FAMILY HISTORY: Family History  Problem Relation Age of Onset   Colon polyps Father        thinks he may have had polyps, possibly in his 57s.    Heart attack Father    Dementia Mother    Hypotension Mother    Stroke Sister    Diabetes Maternal Grandmother    Stroke Maternal Grandfather    Cancer Paternal Grandmother    Congestive Heart Failure Paternal Grandfather    Kidney cancer Daughter    Colon cancer Neg Hx    Pancreatic cancer Neg Hx    Liver disease Neg Hx     ALLERGIES:  has No Known Allergies.  MEDICATIONS:  Current Outpatient Medications  Medication Sig Dispense Refill   aspirin EC 81 MG tablet Take 81 mg by mouth 3 (three) times a week. Pt takes M,W,F     carvedilol (COREG) 6.25 MG tablet Take 6.25 mg by mouth 2 (two) times daily.     Cholecalciferol (VITAMIN D3) 1.25 MG (50000 UT) CAPS Take by mouth.     escitalopram (LEXAPRO) 20 MG tablet Take 20 mg by mouth daily.      gabapentin (NEURONTIN) 100 MG capsule Take 100 mg by mouth 2 (two) times daily.     nitroGLYCERIN (NITROSTAT) 0.4 MG SL tablet SMARTSIG:1 Tablet(s) Sublingual As Needed     Pitavastatin Calcium (LIVALO) 4 MG TABS Take 4 mg by mouth daily.     prasugrel (EFFIENT) 10 MG TABS tablet Take 10 mg by mouth daily.     sacubitril-valsartan (ENTRESTO) 24-26 MG Take 1 tablet by mouth 2 (two) times daily.     acetaminophen (TYLENOL) 650 MG CR tablet Take 650 mg by  mouth every 8 (eight) hours as needed for pain. (Patient not taking: Reported on 02/08/2021)     cimetidine (TAGAMET) 200 MG tablet Take 200 mg by mouth as needed.  (Patient not taking: Reported on 02/08/2021)     pantoprazole (PROTONIX) 40 MG tablet Take 40 mg by mouth as needed.  (Patient not taking: Reported on 02/08/2021)     No current facility-administered medications for this visit.    REVIEW OF SYSTEMS:   Constitutional: Denies fevers, chills or abnormal night sweats Eyes: Denies blurriness of vision, double vision or watery eyes Ears, nose, mouth, throat, and face: Denies mucositis or sore throat Respiratory: Denies  cough, dyspnea or wheezes Cardiovascular: Denies palpitation, chest discomfort or lower extremity swelling Skin: Denies abnormal skin rashes Lymphatics: Denies new lymphadenopathy or easy bruising Neurological:Denies numbness, tingling or new weaknesses Behavioral/Psych: Mood is stable, no new changes  All other systems were reviewed with the patient and are negative.  PHYSICAL EXAMINATION: ECOG PERFORMANCE STATUS: 1 - Symptomatic but completely ambulatory  Vitals:   02/08/21 0910  BP: 120/79  Pulse: 62  Resp: 18  Temp: (!) 96.8 F (36 C)  SpO2: 99%   Filed Weights   02/08/21 0910  Weight: 199 lb 3.2 oz (90.4 kg)    GENERAL:alert, no distress and comfortable SKIN: skin color, texture, turgor are normal, no rashes or significant lesions EYES: normal, conjunctiva are pink and non-injected, sclera clear OROPHARYNX:no exudate, normal lips, buccal mucosa, and tongue  NECK: supple, thyroid normal size, non-tender, without nodularity LYMPH:  no palpable lymphadenopathy in the cervical, axillary or inguinal LUNGS: clear to auscultation and percussion with normal breathing effort HEART: regular rate & rhythm and no murmurs without lower extremity edema ABDOMEN:abdomen soft, discomfort on palpation in the mid abdomen Musculoskeletal:no cyanosis of digits and no  clubbing  PSYCH: alert & oriented x 3 with fluent speech NEURO: no focal motor/sensory deficits  LABORATORY DATA:  I have reviewed the data as listed Lab Results  Component Value Date   WBC 6.8 01/10/2021   HGB 12.3 (L) 01/10/2021   HCT 37.0 (L) 01/10/2021   MCV 94.4 01/10/2021   PLT 164 01/10/2021   Recent Labs    02/11/20 1049 03/24/20 1008 05/05/20 1045 06/13/20 1104 09/26/20 0826 01/10/21 0819  NA 136 133*   < > 135 137 137  K 3.8 4.1   < > 3.7 3.6 3.6  CL 106 102   < > 104 103 105  CO2 23 23   < > 25 24 27   GLUCOSE 81 89   < > 81 99 82  BUN 19 28*   < > 13 14 15   CREATININE 1.07 1.13   < > 0.92 0.72 0.64  CALCIUM 8.8* 9.0   < > 8.7* 9.1 8.8*  GFRNONAA >60 >60   < > >60 >60 >60  GFRAA >60 >60  --   --   --   --   PROT 6.5 6.8   < > 6.2* 6.1* 5.7*  ALBUMIN 4.0 4.2   < > 3.7 3.7 3.4*  AST 16 20   < > 16 18 13*  ALT 16 18   < > 15 14 12   ALKPHOS 64 64   < > 63 47 50  BILITOT 0.7 0.8   < > 0.9 0.6 0.8   < > = values in this interval not displayed.   ASSESSMENT & PLAN:  Small lymphocytic lymphoma ([facility] [facility]) His last imaging study in November 2021 showed no evidence of disease Since stopping treatment, he has not been feeling well He felt intermittent abdominal pain, bloating and frequent postprandial diarrhea He also noted rectal bleeding at least 3 times a week I recommend repeat imaging study as soon as possible to assess for disease recurrence He is in agreement  Anemia, chronic disease Likely multifactorial anemia, combination of anemia of chronic illness and rectal bleeding He is not symptomatic from anemia standpoint Observe  Bile salt-induced diarrhea He has frequent postprandial diarrhea From history, I suspect this could be due to bile salt diarrhea after cholecystectomy I recommend dietary modification and Imodium as tolerated  Orders Placed This Encounter  Procedures   NM PET Image Restage (PS) Skull Base to Thigh    Standing Status:   Future     Standing Expiration Date:   02/08/2022    Order Specific Question:   If indicated for the ordered procedure, I authorize the administration of a radiopharmaceutical per Radiology protocol    Answer:   Yes    Order Specific Question:   Preferred imaging location?    Answer:   [facility]    Order Specific Question:   Radiology Contrast Protocol - do NOT remove file path    Answer:   [email or web address]    All questions were answered. The patient knows to call the clinic with any problems, questions or concerns. The total time spent in the appointment was 20 minutes encounter with patients including review of chart and various tests results, discussions about plan of care and coordination of care plan   Heath Lark, MD 02/08/2021 6:22 PM

## 2021-02-08 NOTE — Assessment & Plan Note (Addendum)
His last imaging study in November 2021 showed no evidence of disease Since stopping treatment, he has not been feeling well He felt intermittent abdominal pain, bloating and frequent postprandial diarrhea He also noted rectal bleeding at least 3 times a week I recommend repeat imaging study as soon as possible to assess for disease recurrence He is in agreement

## 2021-02-08 NOTE — Assessment & Plan Note (Signed)
Likely multifactorial anemia, combination of anemia of chronic illness and rectal bleeding He is not symptomatic from anemia standpoint Observe

## 2021-02-08 NOTE — Assessment & Plan Note (Signed)
He has frequent postprandial diarrhea From history, I suspect this could be due to bile salt diarrhea after cholecystectomy I recommend dietary modification and Imodium as tolerated

## 2021-02-22 ENCOUNTER — Other Ambulatory Visit (HOSPITAL_COMMUNITY): Payer: Self-pay

## 2021-02-22 DIAGNOSIS — C8300 Small cell B-cell lymphoma, unspecified site: Secondary | ICD-10-CM

## 2021-02-24 ENCOUNTER — Other Ambulatory Visit (HOSPITAL_COMMUNITY): Payer: Self-pay

## 2021-02-24 DIAGNOSIS — D638 Anemia in other chronic diseases classified elsewhere: Secondary | ICD-10-CM

## 2021-02-24 DIAGNOSIS — C8300 Small cell B-cell lymphoma, unspecified site: Secondary | ICD-10-CM

## 2021-02-27 ENCOUNTER — Other Ambulatory Visit: Payer: Self-pay

## 2021-02-27 ENCOUNTER — Inpatient Hospital Stay (HOSPITAL_COMMUNITY): Payer: 59 | Attending: Hematology

## 2021-02-27 ENCOUNTER — Ambulatory Visit (HOSPITAL_COMMUNITY): Payer: 59

## 2021-02-27 DIAGNOSIS — Z79899 Other long term (current) drug therapy: Secondary | ICD-10-CM | POA: Insufficient documentation

## 2021-02-27 DIAGNOSIS — Z8572 Personal history of non-Hodgkin lymphomas: Secondary | ICD-10-CM | POA: Diagnosis not present

## 2021-02-27 DIAGNOSIS — C8300 Small cell B-cell lymphoma, unspecified site: Secondary | ICD-10-CM

## 2021-02-27 DIAGNOSIS — D638 Anemia in other chronic diseases classified elsewhere: Secondary | ICD-10-CM | POA: Diagnosis not present

## 2021-02-27 LAB — COMPREHENSIVE METABOLIC PANEL
ALT: 15 U/L (ref 0–44)
AST: 14 U/L — ABNORMAL LOW (ref 15–41)
Albumin: 3.7 g/dL (ref 3.5–5.0)
Alkaline Phosphatase: 56 U/L (ref 38–126)
Anion gap: 5 (ref 5–15)
BUN: 14 mg/dL (ref 8–23)
CO2: 28 mmol/L (ref 22–32)
Calcium: 8.8 mg/dL — ABNORMAL LOW (ref 8.9–10.3)
Chloride: 104 mmol/L (ref 98–111)
Creatinine, Ser: 0.72 mg/dL (ref 0.61–1.24)
GFR, Estimated: >60 mL/min (ref >60)
Glucose, Bld: 89 mg/dL (ref 70–99)
Potassium: 4.3 mmol/L (ref 3.5–5.1)
Sodium: 137 mmol/L (ref 135–145)
Total Bilirubin: 0.8 mg/dL (ref 0.3–1.2)
Total Protein: 6.6 g/dL (ref 6.5–8.1)

## 2021-02-27 LAB — LACTATE DEHYDROGENASE: LDH: 135 U/L (ref 98–192)

## 2021-02-27 LAB — CBC WITH DIFFERENTIAL/PLATELET
Abs Immature Granulocytes: 0.04 10*3/uL (ref 0.00–0.07)
Basophils Absolute: 0.1 10*3/uL (ref 0.0–0.1)
Basophils Relative: 1 %
Eosinophils Absolute: 0.2 10*3/uL (ref 0.0–0.5)
Eosinophils Relative: 2 %
HCT: 41.1 % (ref 39.0–52.0)
Hemoglobin: 13.9 g/dL (ref 13.0–17.0)
Immature Granulocytes: 1 %
Lymphocytes Relative: 22 %
Lymphs Abs: 1.6 10*3/uL (ref 0.7–4.0)
MCH: 31.7 pg (ref 26.0–34.0)
MCHC: 33.8 g/dL (ref 30.0–36.0)
MCV: 93.6 fL (ref 80.0–100.0)
Monocytes Absolute: 0.5 10*3/uL (ref 0.1–1.0)
Monocytes Relative: 7 %
Neutro Abs: 5.1 10*3/uL (ref 1.7–7.7)
Neutrophils Relative %: 67 %
Platelets: 191 10*3/uL (ref 150–400)
RBC: 4.39 MIL/uL (ref 4.22–5.81)
RDW: 12.6 % (ref 11.5–15.5)
WBC: 7.5 10*3/uL (ref 4.0–10.5)
nRBC: 0 % (ref 0.0–0.2)

## 2021-02-28 ENCOUNTER — Other Ambulatory Visit (HOSPITAL_COMMUNITY): Payer: 59

## 2021-03-01 ENCOUNTER — Ambulatory Visit (HOSPITAL_COMMUNITY)
Admission: RE | Admit: 2021-03-01 | Discharge: 2021-03-01 | Disposition: A | Discharge status: Home / Self Care | Payer: 59 | Source: Ambulatory Visit | Attending: Hematology | Admitting: Hematology

## 2021-03-01 ENCOUNTER — Other Ambulatory Visit: Payer: Self-pay

## 2021-03-01 ENCOUNTER — Encounter (HOSPITAL_COMMUNITY): Payer: Self-pay

## 2021-03-01 ENCOUNTER — Ambulatory Visit (HOSPITAL_COMMUNITY): Payer: 59 | Admitting: Hematology

## 2021-03-01 DIAGNOSIS — C8300 Small cell B-cell lymphoma, unspecified site: Secondary | ICD-10-CM | POA: Diagnosis present

## 2021-03-01 MED ORDER — IOHEXOL 350 MG/ML SOLN
100.0000 mL | Freq: Once | INTRAVENOUS | Status: AC | PRN
  Administered 2021-03-01: 80 mL via INTRAVENOUS

## 2021-03-13 NOTE — Progress Notes (Signed)
Patrick Rubio, [place] [id number]   CLINIC:  Medical Oncology/Hematology  PCP:  Orpah Greek, MD [address] / [place] [address] [phone]   REASON FOR VISIT:  Follow-up for small lymphocytic lymphoma  PRIOR THERAPY:  1. Obinutuzumab & venetoclax x 6 cycles from 06/09/2019 to 10/27/2019. 2. Venetoclax 200 mg through 06/2020.  NGS Results: not done  CURRENT THERAPY: surveillance  BRIEF ONCOLOGIC HISTORY:  Oncology History  Small lymphocytic lymphoma ([place])  05/04/2019 Initial Diagnosis   Small lymphocytic lymphoma ([place])   06/09/2019 -  Chemotherapy   The patient had obinutuzumab (GAZYVA) 100 mg in sodium chloride 0.9 % 100 mL (0.9615 mg/mL) chemo infusion, 100 mg, Intravenous, Once, 6 of 6 cycles Administration: 100 mg (06/09/2019), 900 mg (06/10/2019), 1,000 mg (06/16/2019), 1,000 mg (07/07/2019), 1,000 mg (06/23/2019), 1,000 mg (08/04/2019), 1,000 mg (09/01/2019), 1,000 mg (09/29/2019), 1,000 mg (10/27/2019)   for chemotherapy treatment.       CANCER STAGING: Cancer Staging No matching staging information was found for the patient.  INTERVAL HISTORY:  [name], a 60 y.o. male, returns for routine follow-up of his small lymphocytic lymphoma. Zohaib was last seen on 10/03/20.   Today he reports feeling good. He reports that his appetite and energy levels are improved. 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 He denies recent fevers, infections, and night sweats. He reports occasional watery diarrhea about 2-3 times weekly. He reports constant pain in his mid-abdomen for about 2 months.   REVIEW OF SYSTEMS:  Review of Systems  Constitutional:  Positive for appetite change (70%) and fatigue (70%). Negative for fever.  Gastrointestinal:  Positive for abdominal pain (7/10 mid) and diarrhea.  All other systems reviewed and are negative.  PAST MEDICAL/SURGICAL HISTORY:  Past Medical  History:  Diagnosis Date   CAD (coronary artery disease)    GERD (gastroesophageal reflux disease)    HTN (hypertension)    Hypercholesterolemia    MI (myocardial infarction) ([place]) 2003   Past Surgical History:  Procedure Laterality Date   AXILLARY LYMPH NODE BIOPSY Left 04/27/2019   Procedure: AXILLARY LYMPH NODE BIOPSY;  Surgeon: Aviva Signs, MD;  Location: [facility];  Service: General;  Laterality: Left;   BIOPSY  11/26/2018   Procedure: BIOPSY;  Surgeon: Daneil Dolin, MD;  Location: [facility] SUITE;  Service: Endoscopy;;   CHOLECYSTECTOMY N/A 05/18/2019   Procedure: LAPAROSCOPIC CHOLECYSTECTOMY;  Surgeon: Aviva Signs, MD;  Location: [facility];  Service: General;  Laterality: N/A;   CORONARY ANGIOPLASTY WITH STENT PLACEMENT     2003   ESOPHAGOGASTRODUODENOSCOPY N/A 11/26/2018   mild erosive reflux esophagitis, gastric erythema. 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  Tobacco Use   Smoking status: Every Day  Packs/day: 0.50    Years: 25.00    Pack years: 12.50    Types: Cigarettes   Smokeless tobacco: Never  Vaping Use   Vaping Use: Never used  Substance and Sexual Activity   Alcohol use: Yes    Comment: occ   Drug use: Never   Sexual activity: Yes  Other Topics Concern   Not on file  Social History Narrative   Not on file   Social Determinants of Health   Financial Resource Strain: Low Risk     Difficulty of Paying Living Expenses: Not hard at all  Food Insecurity: No Food Insecurity   Worried About Charity fundraiser in the Last Year: Never true   [place] in the Last Year: Never true  Transportation Needs: No Transportation Needs   Lack of Transportation (Medical): No   Lack of Transportation (Non-Medical): No  Physical Activity: Inactive   Days of Exercise per Week: 0 days   Minutes of Exercise per Session: 0 min  Stress: No Stress Concern Present   Feeling of Stress : Not at all  Social Connections: Socially Isolated   Frequency of Communication with Friends and Family: More than three times a week   Frequency of Social Gatherings with Friends and Family: More than three times a week   Attends Religious Services: Never   Marine scientist or Organizations: No   Attends Music therapist: Never   Marital Status: Divorced  Human resources officer Violence: Not At Risk   Fear of Current or Ex-Partner: No   Emotionally Abused: No   Physically Abused: No   Sexually Abused: No    FAMILY HISTORY:  Family History  Problem Relation Age of Onset   Colon polyps Father        thinks he may have had polyps, possibly in his 68s.    Heart attack Father    Dementia Mother    Hypotension Mother    Stroke Sister    Diabetes Maternal Grandmother    Stroke Maternal Grandfather    Cancer Paternal Grandmother    Congestive Heart Failure Paternal Grandfather    Kidney cancer Daughter    Colon cancer Neg Hx    Pancreatic cancer Neg Hx    Liver disease Neg Hx     CURRENT MEDICATIONS:  Current Outpatient Medications  Medication Sig Dispense Refill   acetaminophen (TYLENOL) 650 MG CR tablet Take 650 mg by mouth every 8 (eight) hours as needed for pain. (Patient not taking: Reported on 02/08/2021)     aspirin EC 81 MG tablet Take 81 mg by mouth 3 (three) times a week. Pt takes M,W,F     carvedilol (COREG) 6.25 MG tablet Take 6.25 mg by mouth 2 (two) times daily.      Cholecalciferol (VITAMIN D3) 1.25 MG (50000 UT) CAPS Take by mouth.     cimetidine (TAGAMET) 200 MG tablet Take 200 mg by mouth as needed.  (Patient not taking: Reported on 02/08/2021)     escitalopram (LEXAPRO) 20 MG tablet Take 20 mg by mouth daily.      gabapentin (NEURONTIN) 100 MG capsule Take 100 mg by mouth 2 (two) times daily.     nitroGLYCERIN (NITROSTAT) 0.4 MG SL tablet SMARTSIG:1 Tablet(s) Sublingual As Needed     pantoprazole (PROTONIX) 40 MG tablet Take 40 mg by mouth as needed.  (Patient not taking: Reported on 02/08/2021)     Pitavastatin Calcium (LIVALO) 4 MG TABS Take 4 mg  by mouth daily.     prasugrel (EFFIENT) 10 MG TABS tablet Take 10 mg by mouth daily.     sacubitril-valsartan (ENTRESTO) 24-26 MG Take 1 tablet by mouth 2 (two) times daily.     No current facility-administered medications for this visit.    ALLERGIES:  No Known Allergies  PHYSICAL EXAM:  Performance status (ECOG): 1 - Symptomatic but completely ambulatory  There were no vitals filed for this visit. Wt Readings from Last 3 Encounters:  02/08/21 199 lb 3.2 oz (90.4 kg)  10/03/20 193 lb (87.5 kg)  06/22/20 206 lb 3.2 oz (93.5 kg)   Physical Exam Vitals reviewed.  Constitutional:      Appearance: Normal appearance.  Cardiovascular:     Rate and Rhythm: Normal rate and regular rhythm.     Pulses: Normal pulses.     Heart sounds: Normal heart sounds.  Pulmonary:     Effort: Pulmonary effort is normal.     Breath sounds: Normal breath sounds.  Abdominal:     Palpations: Abdomen is soft. There is no hepatomegaly, splenomegaly or mass.     Tenderness: There is no abdominal tenderness.  Neurological:     General: No focal deficit present.     Mental Status: He is alert and oriented to person, place, and time.  Psychiatric:        Mood and Affect: Mood normal.        Behavior: Behavior normal.     LABORATORY DATA:  I have reviewed the labs as listed.  CBC Latest Ref Rng & Units 02/27/2021  01/10/2021 09/26/2020  WBC 4.0 - 10.5 K/uL 7.5 6.8 5.1  Hemoglobin 13.0 - 17.0 g/dL 13.9 12.3(L) 13.0  Hematocrit 39.0 - 52.0 % 41.1 37.0(L) 38.9(L)  Platelets 150 - 400 K/uL 191 164 177   CMP Latest Ref Rng & Units 02/27/2021 01/10/2021 09/26/2020  Glucose 70 - 99 mg/dL 89 82 99  BUN 8 - 23 mg/dL '14 15 14  '$ Creatinine 0.61 - 1.24 mg/dL 0.72 0.64 0.72  Sodium 135 - 145 mmol/L 137 137 137  Potassium 3.5 - 5.1 mmol/L 4.3 3.6 3.6  Chloride 98 - 111 mmol/L 104 105 103  CO2 22 - 32 mmol/L '28 27 24  '$ Calcium 8.9 - 10.3 mg/dL 8.8(L) 8.8(L) 9.1  Total Protein 6.5 - 8.1 g/dL 6.6 5.7(L) 6.1(L)  Total Bilirubin 0.3 - 1.2 mg/dL 0.8 0.8 0.6  Alkaline Phos 38 - 126 U/L 56 50 47  AST 15 - 41 U/L 14(L) 13(L) 18  ALT 0 - 44 U/L '15 12 14    '$ DIAGNOSTIC IMAGING:  I have independently reviewed the scans and discussed with the patient. CT CHEST ABDOMEN PELVIS W CONTRAST  Result Date: 03/02/2021 CLINICAL DATA:  Small lymphocytic lymphoma EXAM: CT CHEST, ABDOMEN, AND PELVIS WITH CONTRAST TECHNIQUE: Multidetector CT imaging of the chest, abdomen and pelvis was performed following the standard protocol during bolus administration of intravenous contrast. CONTRAST:  44m OMNIPAQUE IOHEXOL 350 MG/ML SOLN, additional oral enteric contrast COMPARISON:  PET-CT, 06/20/2020 FINDINGS: CT CHEST FINDINGS Cardiovascular: Scattered aortic atherosclerosis. Normal heart size. Extensive 3 vessel coronary artery calcifications and stents. No pericardial effusion. Mediastinum/Nodes: No enlarged mediastinal, hilar, or axillary lymph nodes. Thyroid gland, trachea, and esophagus demonstrate no significant findings. Lungs/Pleura: Background of very fine centrilobular pulmonary nodules, most concentrated in the lung apices. Diffuse bilateral bronchial wall thickening. Mild, bandlike scarring of the lingula (series 6, image 94). No pleural effusion or pneumothorax. Musculoskeletal: No chest wall mass or  suspicious bone lesions identified. CT  ABDOMEN PELVIS FINDINGS Hepatobiliary: No focal liver abnormality is seen. Status post cholecystectomy. No biliary dilatation. Pancreas: Unremarkable. No pancreatic ductal dilatation or surrounding inflammatory changes. Spleen: Normal in size without significant abnormality. Adrenals/Urinary Tract: Adrenal glands are unremarkable. Kidneys are normal, without renal calculi, solid lesion, or hydronephrosis. Bladder is unremarkable. Stomach/Bowel: Stomach is within normal limits. Appendix is not clearly visualized and may be surgically absent. No evidence of bowel wall thickening, distention, or inflammatory changes. Vascular/Lymphatic: Aortic atherosclerosis. No enlarged abdominal or pelvic lymph nodes. Reproductive: Mild prostatomegaly. Other: Small, fat containing umbilical hernia. Small, fat containing left inguinal hernia. No abdominopelvic ascites. Musculoskeletal: No acute or significant osseous findings. IMPRESSION: 1. No evidence of lymphadenopathy in the chest, abdomen, or pelvis. 2. Background of very fine centrilobular pulmonary nodules, most concentrated in the lung apices, most commonly seen in smoking-related respiratory bronchiolitis. 3. Diffuse bilateral bronchial wall thickening, consistent with nonspecific infectious or inflammatory bronchitis. 4. Mild prostatomegaly. 5. Coronary artery disease. Aortic Atherosclerosis (ICD10-I70.0). Electronically Signed   By: Eddie Candle M.D.   On: 03/02/2021 16:12     ASSESSMENT:  1.  Clinical stage IIIb small lymphocytic lymphoma: -6 cycles of obinutuzumab and venetoclax from 06/09/2019 through 10/27/2019. -PET scan on 11/23/2019 showed complete metabolic response to therapy of lymphoma.  Slight hypermetabolism in the right inguinal region from hernia surgery.  New hypermetabolic soft tissue fullness about the ischial tuberosity, possibly related to hamstring origin injury. -Venetoclax was dose reduced to 200 mg daily secondary to carvedilol. -PET scan on  June 20, 2020 with no findings suspicious for active lymphoma. -Venetoclax discontinued in December 2021. - PET scan on 06/20/2020 did not show any evidence of lymphoma. - MRD flow cytometry on 06/13/2020 showed extremely small abnormal B-cell population, less than 0.01% of total cells.   2.  CAD: -Cardiac catheterization on 10/01/2019 found to have 100% occlusion of the RCA. -Angioplasty and stenting of the circumflex was done. -He was started on Effient and Livalo. -Echocardiogram on 01/18/2020 by Dr. Sabra Heck in [place] LVEF 50-55% with mild global hypokinesia, moderate LVH, normal RV function, stage I diastolic dysfunction.   3.  Right posterior hip pain: -MRI of the pelvis without contrast on 01/10/2020 shows complete tear of the right conjoined semitendinosus and biceps femoris tendon at the ischial tuberosity with 2.2 cm retraction.  High-grade partial tear of the right semimembranosus tendon at the ischial tuberosity. -He was evaluated by Dr. Mardelle Matte and conservative management recommended.   PLAN:  1.  Clinical stage IIIb small lymphocytic lymphoma: - He denies any fevers, night sweats or weight loss.  He is gaining weight. - Reviewed his labs from 02/27/2021 which showed normal LFTs and LDH.  CBC was normal.  Physical examination did not reveal any palpable adenopathy or splenomegaly. - Reviewed CT CAP from 03/01/2021 which did not show any evidence of lymphadenopathy in the chest, abdomen or pelvis. - He complained of epigastric pain for the past couple of months which is not related to eating or bowel movements.  He reported similar pain prior to his gallbladder resection.  CT scan was unrevealing.  I will make a referral to GI for further work-up. - RTC 4 months with labs and exam for his lymphoma.   2.  Right posterior hip pain: - Conservative management recommended by Dr. Mardelle Matte. - He does not have much pains at this time.   Orders placed this encounter:  No orders of the  defined types were placed in  this encounter.    Derek Jack, MD [place] [phone]   I, Thana Ates, am acting as a scribe for Dr. Derek Jack.  I, Derek Jack MD, have reviewed the above documentation for accuracy and completeness, and I agree with the above.

## 2021-03-14 ENCOUNTER — Inpatient Hospital Stay (HOSPITAL_COMMUNITY): Payer: 59 | Admitting: Hematology

## 2021-03-14 ENCOUNTER — Other Ambulatory Visit: Payer: Self-pay

## 2021-03-14 VITALS — HR 63 | Temp 96.6°F | Resp 16 | Wt 202.8 lb

## 2021-03-14 DIAGNOSIS — Z8572 Personal history of non-Hodgkin lymphomas: Secondary | ICD-10-CM | POA: Diagnosis not present

## 2021-03-14 DIAGNOSIS — C8300 Small cell B-cell lymphoma, unspecified site: Secondary | ICD-10-CM | POA: Diagnosis not present

## 2021-03-14 NOTE — Patient Instructions (Addendum)
[place] [facility] Discharge Instructions  You were seen today by Dr. Delton Coombes. He went over your recent results. You will be referred to Dr. Gala Romney for follow-up for your abdominal pain. Dr. Delton Coombes will see you back in 4 months for labs and follow up.   Thank you for choosing [place] at [facility] to provide your oncology and hematology care.  To afford each patient quality time with our provider, please arrive at least 15 minutes before your scheduled appointment time.   If you have a lab appointment with the [facility] please come in thru the Main Entrance and check in at the main information desk  You need to re-schedule your appointment should you arrive 10 or more minutes late.  We strive to give you quality time with our providers, and arriving late affects you and other patients whose appointments are after yours.  Also, if you no show three or more times for appointments you may be dismissed from the clinic at the providers discretion.     Again, thank you for choosing [facility].  Our hope is that these requests will decrease the amount of time that you wait before being seen by our physicians.       _____________________________________________________________  Should you have questions after your visit to [facility], please contact our office at [phone] between the hours of 8:00 a.m. and 4:30 p.m.  Voicemails left after 4:00 p.m. will not be returned until the following business day.  For prescription refill requests, have your pharmacy contact our office and allow 72 hours.    Cancer Center Support Programs:   > Cancer Support Group  2nd Tuesday of the month 1pm-2pm, Journey Room

## 2021-07-16 NOTE — Progress Notes (Signed)
[address] [place], [place] [address]   CLINIC:  Medical Oncology/Hematology  PCP:  Orpah Greek, MD [address], [address] / [place] [address] [phone]   REASON FOR VISIT:  Follow-up for small lymphocytic lymphoma  PRIOR THERAPY:  1. Obinutuzumab & venetoclax x 6 cycles from 06/09/2019 to 10/27/2019. 2. Venetoclax 200 mg through 06/2020.  NGS Results: not done  CURRENT THERAPY: surveillance  BRIEF ONCOLOGIC HISTORY:  Oncology History  Small lymphocytic lymphoma ([place])  05/04/2019 Initial Diagnosis   Small lymphocytic lymphoma ([place])   06/09/2019 -  Chemotherapy   The patient had obinutuzumab (GAZYVA) 100 mg in sodium chloride 0.9 % 100 mL (0.9615 mg/mL) chemo infusion, 100 mg, Intravenous, Once, 6 of 6 cycles Administration: 100 mg (06/09/2019), 900 mg (06/10/2019), 1,000 mg (06/16/2019), 1,000 mg (07/07/2019), 1,000 mg (06/23/2019), 1,000 mg (08/04/2019), 1,000 mg (09/01/2019), 1,000 mg (09/29/2019), 1,000 mg (10/27/2019)   for chemotherapy treatment.       CANCER STAGING:  Cancer Staging  No matching staging information was found for the patient.  INTERVAL HISTORY:  Mr. Ulysees Robarts, a 60 y.o. male, returns for routine follow-up of his small lymphocytic lymphoma. [place] was last seen on 03/14/2021.  Today he reports feeling well. He reports watery diarrhea occurring 2-3 times daily since stopping chemotherapy. He denies lumps. fevers, and night sweats. He has lost 8 lbs since 03/14/2021.  He reports constant epigastric pain which he rates 8 out of 10. He eats 1.5 meals daily.   REVIEW OF SYSTEMS:  Review of Systems  Constitutional:  Positive for appetite change (40%), fatigue (40%) and unexpected weight change (-8 lbs). Negative for fever.  HENT:   Negative for lump/mass.   Gastrointestinal:  Positive for abdominal pain (8/10) and diarrhea.  All other systems reviewed and are negative.  PAST MEDICAL/SURGICAL HISTORY:  Past Medical  History:  Diagnosis Date   CAD (coronary artery disease)    GERD (gastroesophageal reflux disease)    HTN (hypertension)    Hypercholesterolemia    MI (myocardial infarction) ([place] [place]) 2003   Past Surgical History:  Procedure Laterality Date   AXILLARY LYMPH NODE BIOPSY Left 04/27/2019   Procedure: AXILLARY LYMPH NODE BIOPSY;  Surgeon: Aviva Signs, MD;  Location: [facility];  Service: General;  Laterality: Left;   BIOPSY  11/26/2018   Procedure: BIOPSY;  Surgeon: Daneil Dolin, MD;  Location: [facility];  Service: Endoscopy;;   CHOLECYSTECTOMY N/A 05/18/2019   Procedure: LAPAROSCOPIC CHOLECYSTECTOMY;  Surgeon: Aviva Signs, MD;  Location: [facility];  Service: General;  Laterality: N/A;   CORONARY ANGIOPLASTY WITH STENT PLACEMENT     2003   ESOPHAGOGASTRODUODENOSCOPY N/A 11/26/2018   mild erosive reflux esophagitis, gastric erythema. 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  Tobacco Use   Smoking status: Every Day  Packs/day: 0.50    Years: 25.00    Pack years: 12.50    Types: Cigarettes   Smokeless tobacco: Never  Vaping Use   Vaping Use: Never used  Substance and Sexual Activity   Alcohol use: Yes    Comment: occ   Drug use: Never   Sexual activity: Yes  Other Topics Concern   Not on file  Social History Narrative   Not on file   Social Determinants of Health   Financial Resource Strain: Not on file  Food  Insecurity: Not on file  Transportation Needs: Not on file  Physical Activity: Not on file  Stress: Not on file  Social Connections: Not on file  Intimate Partner Violence: Not on file    FAMILY HISTORY:  Family History  Problem Relation Age of Onset   Colon polyps Father        thinks he may have had polyps, possibly in his 36s.    Heart attack Father    Dementia Mother    Hypotension Mother    Stroke Sister    Diabetes Maternal Grandmother    Stroke Maternal Grandfather    Cancer Paternal Grandmother    Congestive Heart Failure Paternal Grandfather    Kidney cancer Daughter    Colon cancer Neg Hx    Pancreatic cancer Neg Hx    Liver disease Neg Hx     CURRENT MEDICATIONS:  Current Outpatient Medications  Medication Sig Dispense Refill   allopurinol (ZYLOPRIM) 300 MG tablet Take 300 mg by mouth daily.     aspirin EC 81 MG tablet Take 81 mg by mouth 3 (three) times a week. Pt takes M,W,F     baclofen (LIORESAL) 10 MG tablet Take 10 mg by mouth 2 (two) times daily.     carvedilol (COREG) 6.25 MG tablet Take 6.25 mg by mouth 2 (two) times daily.     Cholecalciferol (VITAMIN D3) 1.25 MG (50000 UT) CAPS Take by mouth.     cimetidine (TAGAMET) 200 MG tablet Take 200 mg by mouth as needed.     escitalopram (LEXAPRO) 20 MG tablet Take 20 mg by mouth daily.      gabapentin (NEURONTIN) 300 MG capsule Take 300 mg by mouth 2 (two) times daily.     pantoprazole (PROTONIX) 40 MG tablet Take 40 mg by mouth as needed.     Pitavastatin Calcium (LIVALO) 4 MG TABS Take 4 mg by mouth daily.     prasugrel (EFFIENT) 10 MG TABS tablet Take 10 mg by mouth daily.     sacubitril-valsartan (ENTRESTO) 24-26 MG Take 1 tablet by mouth 2 (two) times daily.     acetaminophen (TYLENOL) 650 MG CR tablet Take 650 mg by mouth every 8 (eight) hours as needed for pain. (Patient not taking: Reported on 07/17/2021)     celecoxib (CELEBREX) 200 MG capsule Take 200 mg by mouth 2 (two) times daily as needed.  (Patient not taking: Reported on 07/17/2021)     nitroGLYCERIN (NITROSTAT) 0.4 MG SL tablet SMARTSIG:1 Tablet(s) Sublingual As Needed (Patient not taking: Reported on 07/17/2021)     No current facility-administered medications for this visit.    ALLERGIES:  No Known Allergies  PHYSICAL EXAM:  Performance status (ECOG): 1 - Symptomatic but completely ambulatory  Vitals:   07/17/21 1148  BP: 135/78  Pulse: (!) 58  Resp: 18  Temp: 97.8 F (36.6 C)  SpO2: 96%   Wt Readings from Last 3 Encounters:  07/17/21 194 lb 14.4 oz (  88.4 kg)  03/14/21 202 lb 12.8 oz (92 kg)  02/08/21 199 lb 3.2 oz (90.4 kg)   Physical Exam Vitals reviewed.  Constitutional:      Appearance: Normal appearance.  Cardiovascular:     Rate and Rhythm: Normal rate and regular rhythm.     Pulses: Normal pulses.     Heart sounds: Normal heart sounds.  Pulmonary:     Effort: Pulmonary effort is normal.     Breath sounds: Normal breath sounds.  Abdominal:     Palpations: Abdomen is soft. There is no hepatomegaly, splenomegaly or mass.     Tenderness: There is abdominal tenderness in the epigastric area.  Lymphadenopathy:     Cervical: No cervical adenopathy.     Right cervical: No superficial cervical adenopathy.    Left cervical: No superficial cervical adenopathy.     Upper Body:     Right upper body: No supraclavicular, axillary or pectoral adenopathy.     Left upper body: No supraclavicular, axillary or pectoral adenopathy.  Neurological:     General: No focal deficit present.     Mental Status: He is alert and oriented to person, place, and time.  Psychiatric:        Mood and Affect: Mood normal.        Behavior: Behavior normal.     LABORATORY DATA:  I have reviewed the labs as listed.  CBC Latest Ref Rng & Units 07/17/2021 02/27/2021 01/10/2021  WBC 4.0 - 10.5 K/uL 7.1 7.5 6.8  Hemoglobin 13.0 - 17.0 g/dL 13.2 13.9 12.3(L)  Hematocrit 39.0 - 52.0 % 38.7(L) 41.1 37.0(L)  Platelets 150 - 400  K/uL 171 191 164   CMP Latest Ref Rng & Units 07/17/2021 02/27/2021 01/10/2021  Glucose 70 - 99 mg/dL 79 89 82  BUN 8 - 23 mg/dL 14 14 15   Creatinine 0.61 - 1.24 mg/dL 0.71 0.72 0.64  Sodium 135 - 145 mmol/L 134(L) 137 137  Potassium 3.5 - 5.1 mmol/L 3.9 4.3 3.6  Chloride 98 - 111 mmol/L 102 104 105  CO2 22 - 32 mmol/L 24 28 27   Calcium 8.9 - 10.3 mg/dL 8.6(L) 8.8(L) 8.8(L)  Total Protein 6.5 - 8.1 g/dL 6.0(L) 6.6 5.7(L)  Total Bilirubin 0.3 - 1.2 mg/dL 1.0 0.8 0.8  Alkaline Phos 38 - 126 U/L 53 56 50  AST 15 - 41 U/L 14(L) 14(L) 13(L)  ALT 0 - 44 U/L 12 15 12     DIAGNOSTIC IMAGING:  I have independently reviewed the scans and discussed with the patient. No results found.   ASSESSMENT:  1.  Clinical stage IIIb small lymphocytic lymphoma: -6 cycles of obinutuzumab and venetoclax from 06/09/2019 through 10/27/2019. -PET scan on 11/23/2019 showed complete metabolic response to therapy of lymphoma.  Slight hypermetabolism in the right inguinal region from hernia surgery.  New hypermetabolic soft tissue fullness about the ischial tuberosity, possibly related to hamstring origin injury. -Venetoclax was dose reduced to 200 mg daily secondary to carvedilol. -PET scan on June 20, 2020 with no findings suspicious for active lymphoma. -Venetoclax discontinued in December 2021. - PET scan on 06/20/2020 did not show any evidence of lymphoma. - MRD flow cytometry on 06/13/2020 showed extremely small abnormal B-cell population, less than 0.01% of total cells.   2.  CAD: -Cardiac catheterization on 10/01/2019 found to have 100% occlusion of the RCA. -Angioplasty and stenting of the circumflex was done. -He was started on Effient and Livalo. -Echocardiogram on 01/18/2020 by Dr. Sabra Heck in [place] LVEF 50-55%  with mild global hypokinesia, moderate LVH, normal RV function, stage I diastolic dysfunction.   3.  Right posterior hip pain: -MRI of the pelvis without contrast on 01/10/2020 shows complete  tear of the right conjoined semitendinosus and biceps femoris tendon at the ischial tuberosity with 2.2 cm retraction.  High-grade partial tear of the right semimembranosus tendon at the ischial tuberosity. -He was evaluated by Dr. Mardelle Matte and conservative management recommended.   PLAN:  1.  Clinical stage IIIb small lymphocytic lymphoma: - CT CAP from 03/01/2021 did not show any evidence of lymphadenopathy in the chest, abdomen or pelvis. - He is complaining of epigastric pain for the past few months, not related to eating or bowel movements.  It is rated as 8 out of 10.  Reported as constant.  He lost about 8 pounds in the last 4 months.  He also reports watery diarrhea 2-3 times per day. - Recommend evaluation by GI with possible EGD.  We will make a referral again. - Reviewed labs today which showed normal white count, hemoglobin and platelets.  CMP was grossly normal.  LDH was normal. - Physical examination did not reveal any palpable adenopathy or splenomegaly. - RTC 4 months with repeat labs and physical exam.   2.  Right posterior hip pain: - He does not report any pains at this time. - Conservative management recommended by Dr. Mardelle Matte.   Orders placed this encounter:  No orders of the defined types were placed in this encounter.    Derek Jack, MD [place] [phone]   I, Thana Ates, am acting as a scribe for Dr. Derek Jack.  I, Derek Jack MD, have reviewed the above documentation for accuracy and completeness, and I agree with the above.

## 2021-07-17 ENCOUNTER — Other Ambulatory Visit: Payer: Self-pay

## 2021-07-17 ENCOUNTER — Inpatient Hospital Stay (HOSPITAL_COMMUNITY): Payer: 59 | Admitting: Hematology

## 2021-07-17 ENCOUNTER — Inpatient Hospital Stay (HOSPITAL_COMMUNITY): Payer: 59 | Attending: Hematology

## 2021-07-17 VITALS — BP 135/78 | HR 58 | Temp 97.8°F | Resp 18 | Ht 67.0 in | Wt 194.9 lb

## 2021-07-17 DIAGNOSIS — Z79899 Other long term (current) drug therapy: Secondary | ICD-10-CM | POA: Diagnosis not present

## 2021-07-17 DIAGNOSIS — C8300 Small cell B-cell lymphoma, unspecified site: Secondary | ICD-10-CM | POA: Diagnosis not present

## 2021-07-17 DIAGNOSIS — Z9221 Personal history of antineoplastic chemotherapy: Secondary | ICD-10-CM | POA: Diagnosis not present

## 2021-07-17 DIAGNOSIS — Z8572 Personal history of non-Hodgkin lymphomas: Secondary | ICD-10-CM | POA: Insufficient documentation

## 2021-07-17 DIAGNOSIS — F17210 Nicotine dependence, cigarettes, uncomplicated: Secondary | ICD-10-CM | POA: Diagnosis not present

## 2021-07-17 DIAGNOSIS — I2510 Atherosclerotic heart disease of native coronary artery without angina pectoris: Secondary | ICD-10-CM | POA: Diagnosis not present

## 2021-07-17 LAB — CBC WITH DIFFERENTIAL/PLATELET
Abs Immature Granulocytes: 0.02 10*3/uL (ref 0.00–0.07)
Basophils Absolute: 0.1 10*3/uL (ref 0.0–0.1)
Basophils Relative: 1 %
Eosinophils Absolute: 0.1 10*3/uL (ref 0.0–0.5)
Eosinophils Relative: 2 %
HCT: 38.7 % — ABNORMAL LOW (ref 39.0–52.0)
Hemoglobin: 13.2 g/dL (ref 13.0–17.0)
Immature Granulocytes: 0 %
Lymphocytes Relative: 27 %
Lymphs Abs: 1.9 10*3/uL (ref 0.7–4.0)
MCH: 32.2 pg (ref 26.0–34.0)
MCHC: 34.1 g/dL (ref 30.0–36.0)
MCV: 94.4 fL (ref 80.0–100.0)
Monocytes Absolute: 0.5 10*3/uL (ref 0.1–1.0)
Monocytes Relative: 7 %
Neutro Abs: 4.5 10*3/uL (ref 1.7–7.7)
Neutrophils Relative %: 63 %
Platelets: 171 10*3/uL (ref 150–400)
RBC: 4.1 MIL/uL — ABNORMAL LOW (ref 4.22–5.81)
RDW: 12.6 % (ref 11.5–15.5)
WBC: 7.1 10*3/uL (ref 4.0–10.5)
nRBC: 0 % (ref 0.0–0.2)

## 2021-07-17 LAB — COMPREHENSIVE METABOLIC PANEL
ALT: 12 U/L (ref 0–44)
AST: 14 U/L — ABNORMAL LOW (ref 15–41)
Albumin: 3.5 g/dL (ref 3.5–5.0)
Alkaline Phosphatase: 53 U/L (ref 38–126)
Anion gap: 8 (ref 5–15)
BUN: 14 mg/dL (ref 8–23)
CO2: 24 mmol/L (ref 22–32)
Calcium: 8.6 mg/dL — ABNORMAL LOW (ref 8.9–10.3)
Chloride: 102 mmol/L (ref 98–111)
Creatinine, Ser: 0.71 mg/dL (ref 0.61–1.24)
GFR, Estimated: >60 mL/min (ref >60)
Glucose, Bld: 79 mg/dL (ref 70–99)
Potassium: 3.9 mmol/L (ref 3.5–5.1)
Sodium: 134 mmol/L — ABNORMAL LOW (ref 135–145)
Total Bilirubin: 1 mg/dL (ref 0.3–1.2)
Total Protein: 6 g/dL — ABNORMAL LOW (ref 6.5–8.1)

## 2021-07-17 LAB — LACTATE DEHYDROGENASE: LDH: 127 U/L (ref 98–192)

## 2021-07-17 NOTE — Patient Instructions (Addendum)
[place] at [facility] Discharge Instructions   You were seen and examined today by Dr. Delton [place]es. He reviewed your lab work, which was normal/stable. We will refer you to GI doctors for further evaluation of your belly pain.  Return as scheduled for lab work and office visit.    Thank you for choosing [place] at [facility] to provide your oncology and hematology care.  To afford each patient quality time with our provider, please arrive at least 15 minutes before your scheduled appointment time.   If you have a lab appointment with the [facility] please come in thru the Main Entrance and check in at the main information desk.  You need to re-schedule your appointment should you arrive 10 or more minutes late.  We strive to give you quality time with our providers, and arriving late affects you and other patients whose appointments are after yours.  Also, if you no show three or more times for appointments you may be dismissed from the clinic at the providers discretion.     Again, thank you for choosing [facility].  Our hope is that these requests will decrease the amount of time that you wait before being seen by our physicians.       _____________________________________________________________  Should you have questions after your visit to [facility], please contact our office at [phone] and follow the prompts.  Our office hours are 8:00 a.m. and 4:30 p.m. Monday - Friday.  Please note that voicemails left after 4:00 p.m. may not be returned until the following business day.  We are closed weekends and major holidays.  You do have access to a nurse 24-7, just call the main number to the clinic [phone] and do not press any options, hold on the line and a nurse will answer the phone.    For prescription refill requests, have your pharmacy contact our office and allow 72 hours.    Due to Covid, you  will need to wear a mask upon entering the hospital. If you do not have a mask, a mask will be given to you at the Main Entrance upon arrival. For doctor visits, patients may have 1 support person age 28 or older with them. For treatment visits, patients can not have anyone with them due to social distancing guidelines and our immunocompromised population.

## 2021-07-18 ENCOUNTER — Encounter: Payer: Self-pay | Admitting: Internal Medicine

## 2021-07-18 ENCOUNTER — Ambulatory Visit (INDEPENDENT_AMBULATORY_CARE_PROVIDER_SITE_OTHER): Payer: 59 | Admitting: Internal Medicine

## 2021-07-18 ENCOUNTER — Other Ambulatory Visit: Payer: Self-pay

## 2021-07-18 VITALS — BP 124/76 | HR 43 | Temp 97.1°F | Ht 68.0 in | Wt 196.4 lb

## 2021-07-18 DIAGNOSIS — R634 Abnormal weight loss: Secondary | ICD-10-CM

## 2021-07-18 DIAGNOSIS — R197 Diarrhea, unspecified: Secondary | ICD-10-CM

## 2021-07-18 DIAGNOSIS — K921 Melena: Secondary | ICD-10-CM | POA: Diagnosis not present

## 2021-07-18 DIAGNOSIS — K8020 Calculus of gallbladder without cholecystitis without obstruction: Secondary | ICD-10-CM

## 2021-07-18 DIAGNOSIS — K9089 Other intestinal malabsorption: Secondary | ICD-10-CM | POA: Diagnosis not present

## 2021-07-18 MED ORDER — PANTOPRAZOLE SODIUM 40 MG PO TBEC: 40.0000 mg | DELAYED_RELEASE_TABLET | Freq: Every day | ORAL | 2 refills | Status: DC

## 2021-07-18 NOTE — Progress Notes (Signed)
Primary Care Physician:  Orpah Greek, MD Primary Gastroenterologist:  Dr.   Pre-Procedure History & Physical: HPI:  Patrick Rubio is a 60 y.o. male here for evaluation of vague upper abdominal pain,  intermittent watery nonbloody and bloody diarrhea from time to time over the past 6 months.  History of stage IIIb small lymphocytic lymphoma diagnosed back in 2020.  Status post 6 cycles of obinutuzumab and venetoclax (anti-- CD20 and Bcl-2, respectively).  He has had a very nice response based on follow-up PET/CT.  Back in 2020 he also got his gallbladder gallbladder polyps and upper abdominal pain.  He states getting his gallbladder out help.  He also underwent an EGD back in 2020 for similar symptoms.  Had mild erosive reflux esophagitis.  No H. pylori on histology.  He has been on Protonix 40 mg once daily.  Patient states prior to 6 months ago he had normal bowel function.  Denies ever having any bleeding previously.  No prior colonoscopy.  No family history of inflammatory bowel disease or colorectal cancer.  Denies taking NSAIDs. Past Medical History:  Diagnosis Date   CAD (coronary artery disease)    GERD (gastroesophageal reflux disease)    HTN (hypertension)    Hypercholesterolemia    MI (myocardial infarction) ([place]) 2003    Past Surgical History:  Procedure Laterality Date   AXILLARY LYMPH NODE BIOPSY Left 04/27/2019   Procedure: AXILLARY LYMPH NODE BIOPSY;  Surgeon: Aviva Signs, MD;  Location: [facility];  Service: General;  Laterality: Left;   BIOPSY  11/26/2018   Procedure: BIOPSY;  Surgeon: Daneil Dolin, MD;  Location: [facility];  Service: Endoscopy;;   CHOLECYSTECTOMY N/A 05/18/2019   Procedure: LAPAROSCOPIC CHOLECYSTECTOMY;  Surgeon: Aviva Signs, MD;  Location: [facility];  Service: General;  Laterality: N/A;   CORONARY ANGIOPLASTY WITH STENT PLACEMENT     2003   ESOPHAGOGASTRODUODENOSCOPY N/A 11/26/2018   mild erosive reflux esophagitis, gastric erythema. Negative  H.pylori    INGUINAL HERNIA REPAIR Right 04/27/2019   Procedure: HERNIA REPAIR INGUINAL ADULT;  Surgeon: Aviva Signs, MD;  Location: [facility];  Service: General;  Laterality: Right;   KNEE SURGERY Right    lumbar fusion with cage  07/2017   NASAL SINUS SURGERY     TONSILLECTOMY AND ADENOIDECTOMY      Prior to Admission medications   Medication Sig Start Date End Date Taking? Authorizing Provider  acetaminophen (TYLENOL) 650 MG CR tablet Take 1,300 mg by mouth daily.   Yes [provider]  aspirin EC 81 MG tablet Take 81 mg by mouth 3 (three) times a week. Pt takes M,W,F   Yes [provider]  baclofen (LIORESAL) 10 MG tablet Take 10 mg by mouth 2 (two) times daily. 07/09/21  Yes [provider]  carvedilol (COREG) 6.25 MG tablet Take 6.25 mg by mouth 2 (two) times daily. 05/25/20  Yes [provider]  Cholecalciferol (VITAMIN D3) 1.25 MG (50000 UT) CAPS Take by mouth.   Yes [provider]  cimetidine (TAGAMET) 200 MG tablet Take 200 mg by mouth as needed.   Yes [provider]  escitalopram (LEXAPRO) 20 MG tablet Take 20 mg by mouth daily.  10/06/18  Yes [provider]  pantoprazole (PROTONIX) 40 MG tablet Take 40 mg by mouth as needed.   Yes [provider]  Pitavastatin Calcium (LIVALO) 4 MG TABS Take 4 mg by mouth daily.   Yes [provider]  prasugrel (EFFIENT) 10 MG TABS  tablet Take 10 mg by mouth daily. 10/02/19  Yes [provider]  sacubitril-valsartan (ENTRESTO) 24-26 MG Take 1 tablet by mouth 2 (two) times daily.   Yes [provider]  gabapentin (NEURONTIN) 300 MG capsule Take 300 mg by mouth 2 (two) times daily. 06/28/21   [provider]    Allergies as of 07/18/2021   (No Known Allergies)    Family History  Problem Relation Age of Onset   Colon polyps Father        thinks he may have had polyps, possibly in his 65s.    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  Tobacco Use   Smoking status: Every Day    Packs/day: 0.50    Years: 25.00    Pack years: 12.50    Types: Cigarettes   Smokeless tobacco: Never  Vaping Use   Vaping Use: Never used  Substance and Sexual Activity   Alcohol use: Yes    Comment: occ   Drug use: Never   Sexual activity: Yes  Other Topics Concern   Not on file  Social History Narrative   Not on file   Social Determinants of Health   Financial Resource Strain: Not on file  Food Insecurity: Not on file  Transportation Needs: Not on file  Physical Activity: Not on file  Stress: Not on file  Social Connections: Not on file  Intimate Partner Violence: Not on file    Review of Systems: See HPI, otherwise negative ROS  Physical Exam: BP 124/76    Pulse (!) 43    Temp (!) 97.1 F (36.2 C)    Ht 5\' 8"  (1.727 m)    Wt 196 lb 6.4 oz (89.1 kg)    BMI 29.86 kg/m  General:   Alert, pleasant conversant gentleman in no acute distress.   Eyes:  Sclera clear, no icterus.   Conjunctiva pink. Lungs:  Clear throughout to auscultation.   No wheezes, crackles, or rhonchi. No acute distress. Heart:  Regular rate and rhythm; no murmurs, clicks, rubs,  or gallops. Abdomen: Non-distended, normal bowel sounds.  Soft and nontender without appreciable mass or hepatosplenomegaly.  Pulses:  Normal pulses noted. Extremities:  Without clubbing or edema. Rectal: Deferred  until the time of colonoscopy  Impression/Plan: 60 year old gentleman with a history of stage IIIb small lymphocytic lymphoma who appears to have had a great response from biologic therapy now here with a 7-month history of vague abdominal pain intermittently bloody diarrhea.  His symptoms started after completion of biologic therapy. He notes stark difference in bowel function currently to that prior to treatment for lymphoma. It is certainly possible he abdomen he has developed colitis as side effects from biologic therapy but temporal relationship to cessation of biologic agents and  the onset of his symptoms a little unusual but still possible.  I suppose a opportunistic infection could also be superimposed on the scenario.    No antibiotics recently.  Occult inflammatory bowel disease but also theoretically be potential coincidental underlying factor.  Fairly recent cholecystectomy could also be contributing to loose stools via a mechanism of bile salt redistribution related diarrhea.   He has never had a colonoscopy.  Recommendations:   We need to further evaluate bowel symptoms with a colonoscopy in the near future  To this end, we will schedule a diagnostic colonoscopy (diarrhea and rectal bleeding).  ASA 3/propofol next month at [facility] -patient request  We will check with patient's cardiologist to see if it is permissible to stop Effient for 5 days prior to the procedure  If needed, may perform an EGD after colonoscopy in the same setting if further evaluation of abdominal pain is needed.  Prior to colonoscopy, I would like you to submit a stool sample for testing for infection (C. difficile and GIP)  Continue Protonix 40 mg every day before breakfast  Further recommendations to follow after the above studies have been completed.    Notice: This dictation was prepared with Dragon dictation along with smaller phrase technology. Any transcriptional errors that  result from this process are unintentional and may not be corrected upon review.

## 2021-07-18 NOTE — Patient Instructions (Signed)
It was good to see you again today!  As discussed, you have had a great response to treatment for your lymphoma  We need to further evaluate your bowel symptoms with a colonoscopy in the near future  To this end, we will schedule a diagnostic colonoscopy (diarrhea and rectal bleeding).  ASA 3/propofol next month at [facility]  We will check with your cardiologist to see if it is permissible to stop Effient for 5 days prior to the procedure  If needed, may perform an EGD after colonoscopy in the same setting if further evaluation of abdominal pain is needed.  Before your colonoscopy, I would like you to submit a stool sample for testing for infection (C. difficile and GIP)  Continue Protonix 40 mg every day before breakfast  Further recommendations to follow after the above studies have been completed.

## 2021-07-26 ENCOUNTER — Telehealth: Payer: Self-pay

## 2021-07-26 NOTE — Progress Notes (Signed)
Pt will take sample to lab once holidays are over.

## 2021-07-26 NOTE — Telephone Encounter (Signed)
error 

## 2021-07-26 NOTE — Telephone Encounter (Signed)
Phoned and spoke with the pt and got the name of his heart dr and I did a letter for medical clearance and faxed to Dr. Orpah Greek of [facility], [place]. Waiting on a reply

## 2021-08-01 ENCOUNTER — Telehealth: Payer: Self-pay

## 2021-08-01 ENCOUNTER — Other Ambulatory Visit: Payer: Self-pay

## 2021-08-01 ENCOUNTER — Other Ambulatory Visit (HOSPITAL_COMMUNITY)
Admission: RE | Admit: 2021-08-01 | Discharge: 2021-08-01 | Disposition: A | Discharge status: Home / Self Care | Payer: 59 | Source: Ambulatory Visit | Attending: Internal Medicine | Admitting: Internal Medicine

## 2021-08-01 DIAGNOSIS — R197 Diarrhea, unspecified: Secondary | ICD-10-CM | POA: Insufficient documentation

## 2021-08-01 DIAGNOSIS — R634 Abnormal weight loss: Secondary | ICD-10-CM | POA: Diagnosis present

## 2021-08-01 NOTE — Telephone Encounter (Signed)
Phoned to [facility], spoke with the nurse and she wasn't sure if medication clearance was received or not. I re-faxed it to her and waiting for a response.

## 2021-08-02 LAB — GASTROINTESTINAL PANEL BY PCR, STOOL (REPLACES STOOL CULTURE)

## 2021-08-08 NOTE — Telephone Encounter (Signed)
Phoned to [facility], [place] and LMOVM of the receptionist regarding this pt of  Dr. Orpah Greek.

## 2021-08-09 NOTE — Telephone Encounter (Signed)
Documentation from Dr. Donnetta Simpers offce regarding pt medicine clearance:  Patient may hold Effient for 5 days prior to procedure but must continue to take Asprin daily. Pt is to restart Effient the day after the procedure. Documentation placed on your desk to sign off on.

## 2021-08-10 ENCOUNTER — Encounter: Payer: Self-pay | Admitting: *Deleted

## 2021-08-10 MED ORDER — PEG 3350-KCL-NA BICARB-NACL 420 G PO SOLR: ORAL | 0 refills | Status: DC

## 2021-08-10 NOTE — Addendum Note (Signed)
Addended by: Cheron Every on: 08/10/2021 10:45 AM   Modules accepted: Orders

## 2021-08-10 NOTE — Telephone Encounter (Signed)
Called pt. He has been scheduled for TCS +/-EGD WITH PROPOFOL ASA 3 on 1/26 at 7:30am. Aware will mail prep instructions and send to his mychart.    PA approved via [facility] website for TCS/EGD. Auth# [id number], DOS: Aug 24, 2021 - Nov 22, 2021

## 2021-08-10 NOTE — Telephone Encounter (Signed)
Per last OV note, awaiting stool studies prior to scheduling. Please advise Dr. Gala Romney thanks

## 2021-08-10 NOTE — Telephone Encounter (Signed)
Dr. Gala Romney ordered CDIFF but was cancelled due to stool sample submitted. Do you still want him to have this test done before proceeding with scheduling?

## 2021-08-10 NOTE — Telephone Encounter (Signed)
According to Dr. Roseanne Kaufman note from 1-4 he was negative for stool studies

## 2021-08-17 NOTE — Patient Instructions (Signed)
Patrick Rubio  08/17/2021     @PREFPERIOPPHARMACY @   Your procedure is scheduled on  08/24/2021.   Report to [facility] at  0700 A.M.   Call this number if you have problems the morning of surgery:  [phone]   Remember:  Follow the diet and prep instructions given to you by the office.    Take these medicines the morning of surgery with A SIP OF WATER            baclofen, carvedilol, lexapro, protonix, entresto.     Do not wear jewelry, make-up or nail polish.  Do not wear lotions, powders, or perfumes, or deodorant.  Do not shave 48 hours prior to surgery.  Men may shave face and neck.  Do not bring valuables to the hospital.  [facility] is not responsible for any belongings or valuables.  Contacts, dentures or bridgework may not be worn into surgery.  Leave your suitcase in the car.  After surgery it may be brought to your room.  For patients admitted to the hospital, discharge time will be determined by your treatment team.  Patients discharged the day of surgery will not be allowed to drive home and must have someone with them for 24 hours.    Special instructions:   DO NOT smoke tobacco or vape for 24 hours before your procedure.  Please read over the following fact sheets that you were given. Anesthesia Post-op Instructions and Care and Recovery After Surgery      Upper Endoscopy, Adult, Care After This sheet gives you information about how to care for yourself after your procedure. Your health care provider may also give you more specific instructions. If you have problems or questions, contact your health care provider. What can I expect after the procedure? After the procedure, it is common to have: A sore throat. Mild stomach pain or discomfort. Bloating. Nausea. Follow these instructions at home:  Follow instructions from your health care provider about what to eat or drink after your procedure. Return to your normal activities as told  by your health care provider. Ask your health care provider what activities are safe for you. Take over-the-counter and prescription medicines only as told by your health care provider. If you were given a sedative during the procedure, it can affect you for several hours. Do not drive or operate machinery until your health care provider says that it is safe. Keep all follow-up visits as told by your health care provider. This is important. Contact a health care provider if you have: A sore throat that lasts longer than one day. Trouble swallowing. Get help right away if: You vomit blood or your vomit looks like coffee grounds. You have: A fever. Bloody, black, or tarry stools. A severe sore throat or you cannot swallow. Difficulty breathing. Severe pain in your chest or abdomen. Summary After the procedure, it is common to have a sore throat, mild stomach discomfort, bloating, and nausea. If you were given a sedative during the procedure, it can affect you for several hours. Do not drive or operate machinery until your health care provider says that it is safe. Follow instructions from your health care provider about what to eat or drink after your procedure. Return to your normal activities as told by your health care provider. This information is not intended to replace advice given to you by your health care provider. Make sure you discuss any questions you have  with your health care provider. Document Revised: 05/22/2019 Document Reviewed: 12/16/2017 Elsevier Patient Education  2022 [place]. Colonoscopy, Adult, Care After This sheet gives you information about how to care for yourself after your procedure. Your health care provider may also give you more specific instructions. If you have problems or questions, contact your health care provider. What can I expect after the procedure? After the procedure, it is common to have: A small amount of blood in your stool for 24 hours  after the procedure. Some gas. Mild cramping or bloating of your abdomen. Follow these instructions at home: Eating and drinking  Drink enough fluid to keep your urine pale yellow. Follow instructions from your health care provider about eating or drinking restrictions. Resume your normal diet as instructed by your health care provider. Avoid heavy or fried foods that are hard to digest. Activity Rest as told by your health care provider. Avoid sitting for a long time without moving. Get up to take short walks every 1-2 hours. This is important to improve blood flow and breathing. Ask for help if you feel weak or unsteady. Return to your normal activities as told by your health care provider. Ask your health care provider what activities are safe for you. Managing cramping and bloating  Try walking around when you have cramps or feel bloated. Apply heat to your abdomen as told by your health care provider. Use the heat source that your health care provider recommends, such as a moist heat pack or a heating pad. Place a towel between your skin and the heat source. Leave the heat on for 20-30 minutes. Remove the heat if your skin turns bright red. This is especially important if you are unable to feel pain, heat, or cold. You may have a greater risk of getting burned. General instructions If you were given a sedative during the procedure, it can affect you for several hours. Do not drive or operate machinery until your health care provider says that it is safe. For the first 24 hours after the procedure: Do not sign important documents. Do not drink alcohol. Do your regular daily activities at a slower pace than normal. Eat soft foods that are easy to digest. Take over-the-counter and prescription medicines only as told by your health care provider. Keep all follow-up visits as told by your health care provider. This is important. Contact a health care provider if: You have blood in your  stool 2-3 days after the procedure. Get help right away if you have: More than a small spotting of blood in your stool. Large blood clots in your stool. Swelling of your abdomen. Nausea or vomiting. A fever. Increasing pain in your abdomen that is not relieved with medicine. Summary After the procedure, it is common to have a small amount of blood in your stool. You may also have mild cramping and bloating of your abdomen. If you were given a sedative during the procedure, it can affect you for several hours. Do not drive or operate machinery until your health care provider says that it is safe. Get help right away if you have a lot of blood in your stool, nausea or vomiting, a fever, or increased pain in your abdomen. This information is not intended to replace advice given to you by your health care provider. Make sure you discuss any questions you have with your health care provider. Document Revised: 05/22/2019 Document Reviewed: 02/09/2019 Elsevier Patient Education  [place], Care  After This sheet gives you information about how to care for yourself after your procedure. Your health care provider may also give you more specific instructions. If you have problems or questions, contact your health care provider. What can I expect after the procedure? After the procedure, it is common to have: Tiredness. Forgetfulness about what happened after the procedure. Impaired judgment for important decisions. Nausea or vomiting. Some difficulty with balance. Follow these instructions at home: For the time period you were told by your health care provider:   Rest as needed. Do not participate in activities where you could fall or become injured. Do not drive or use machinery. Do not drink alcohol. Do not take sleeping pills or medicines that cause drowsiness. Do not make important decisions or sign legal documents. Do not take care of children on your  own. Eating and drinking Follow the diet that is recommended by your health care provider. Drink enough fluid to keep your urine pale yellow. If you vomit: Drink water, juice, or soup when you can drink without vomiting. Make sure you have little or no nausea before eating solid foods. General instructions Have a responsible adult stay with you for the time you are told. It is important to have someone help care for you until you are awake and alert. Take over-the-counter and prescription medicines only as told by your health care provider. If you have sleep apnea, surgery and certain medicines can increase your risk for breathing problems. Follow instructions from your health care provider about wearing your sleep device: Anytime you are sleeping, including during daytime naps. While taking prescription pain medicines, sleeping medicines, or medicines that make you drowsy. Avoid smoking. Keep all follow-up visits as told by your health care provider. This is important. Contact a health care provider if: You keep feeling nauseous or you keep vomiting. You feel light-headed. You are still sleepy or having trouble with balance after 24 hours. You develop a rash. You have a fever. You have redness or swelling around the IV site. Get help right away if: You have trouble breathing. You have new-onset confusion at home. Summary For several hours after your procedure, you may feel tired. You may also be forgetful and have poor judgment. Have a responsible adult stay with you for the time you are told. It is important to have someone help care for you until you are awake and alert. Rest as told. Do not drive or operate machinery. Do not drink alcohol or take sleeping pills. Get help right away if you have trouble breathing, or if you suddenly become confused. This information is not intended to replace advice given to you by your health care provider. Make sure you discuss any questions you  have with your health care provider. Document Revised: 03/31/2020 Document Reviewed: 06/18/2019 Elsevier Patient Education  2022 [employer].

## 2021-08-21 ENCOUNTER — Telehealth: Payer: Self-pay | Admitting: *Deleted

## 2021-08-21 ENCOUNTER — Encounter (HOSPITAL_COMMUNITY)
Admission: RE | Admit: 2021-08-21 | Discharge: 2021-08-21 | Disposition: A | Discharge status: Home / Self Care | Payer: 59 | Source: Ambulatory Visit | Attending: Internal Medicine | Admitting: Internal Medicine

## 2021-08-21 ENCOUNTER — Encounter (HOSPITAL_COMMUNITY): Payer: Self-pay

## 2021-08-21 NOTE — Telephone Encounter (Signed)
-----   Message from Josue Hector sent at 08/21/2021  8:15 AM EST ----- This patient called and states he needs to cancel.  I advised him to call your office.  I am pulling him into the dep[place].  Please let me know if I should cancel or reschedule.  Thanks,  Hoyle Sauer

## 2021-08-21 NOTE — Telephone Encounter (Signed)
Patient on schedule for Thursday 1/26 with Dr. Gala Romney for TCS/EGD with propofol.  Called pt he wants to cancel for now. Reports can't have it done at this time and will call to reschedule when he is ready. FYI.

## 2021-08-24 ENCOUNTER — Encounter (HOSPITAL_COMMUNITY): Admission: RE | Payer: Self-pay | Source: Home / Self Care

## 2021-08-24 ENCOUNTER — Ambulatory Visit (HOSPITAL_COMMUNITY): Admission: RE | Admit: 2021-08-24 | Payer: 59 | Source: Home / Self Care | Admitting: Internal Medicine

## 2021-08-24 SURGERY — COLONOSCOPY WITH PROPOFOL
Anesthesia: Monitor Anesthesia Care

## 2021-08-29 ENCOUNTER — Other Ambulatory Visit: Payer: Self-pay | Admitting: Internal Medicine

## 2021-08-29 DIAGNOSIS — R197 Diarrhea, unspecified: Secondary | ICD-10-CM

## 2021-08-29 DIAGNOSIS — K8020 Calculus of gallbladder without cholecystitis without obstruction: Secondary | ICD-10-CM

## 2021-08-29 DIAGNOSIS — R634 Abnormal weight loss: Secondary | ICD-10-CM

## 2021-11-15 ENCOUNTER — Inpatient Hospital Stay (HOSPITAL_COMMUNITY): Payer: 59 | Attending: Hematology | Admitting: Hematology

## 2021-11-15 ENCOUNTER — Inpatient Hospital Stay (HOSPITAL_COMMUNITY): Payer: 59

## 2021-11-15 ENCOUNTER — Other Ambulatory Visit (HOSPITAL_COMMUNITY): Payer: Self-pay | Admitting: *Deleted

## 2021-11-15 VITALS — BP 109/73 | HR 68 | Temp 98.2°F | Resp 18 | Ht 68.0 in | Wt 187.4 lb

## 2021-11-15 DIAGNOSIS — F17210 Nicotine dependence, cigarettes, uncomplicated: Secondary | ICD-10-CM | POA: Diagnosis not present

## 2021-11-15 DIAGNOSIS — M25551 Pain in right hip: Secondary | ICD-10-CM | POA: Diagnosis not present

## 2021-11-15 DIAGNOSIS — E782 Mixed hyperlipidemia: Secondary | ICD-10-CM | POA: Diagnosis not present

## 2021-11-15 DIAGNOSIS — C9591 Leukemia, unspecified, in remission: Secondary | ICD-10-CM | POA: Diagnosis not present

## 2021-11-15 DIAGNOSIS — C8300 Small cell B-cell lymphoma, unspecified site: Secondary | ICD-10-CM

## 2021-11-15 DIAGNOSIS — Z85028 Personal history of other malignant neoplasm of stomach: Secondary | ICD-10-CM | POA: Diagnosis not present

## 2021-11-15 DIAGNOSIS — Z7982 Long term (current) use of aspirin: Secondary | ICD-10-CM | POA: Diagnosis not present

## 2021-11-15 DIAGNOSIS — R7401 Elevation of levels of liver transaminase levels: Secondary | ICD-10-CM | POA: Diagnosis not present

## 2021-11-15 DIAGNOSIS — M549 Dorsalgia, unspecified: Secondary | ICD-10-CM | POA: Diagnosis not present

## 2021-11-15 DIAGNOSIS — Z9221 Personal history of antineoplastic chemotherapy: Secondary | ICD-10-CM | POA: Diagnosis not present

## 2021-11-15 DIAGNOSIS — I110 Hypertensive heart disease with heart failure: Secondary | ICD-10-CM | POA: Diagnosis not present

## 2021-11-15 DIAGNOSIS — D649 Anemia, unspecified: Secondary | ICD-10-CM | POA: Diagnosis not present

## 2021-11-15 DIAGNOSIS — K219 Gastro-esophageal reflux disease without esophagitis: Secondary | ICD-10-CM | POA: Diagnosis not present

## 2021-11-15 DIAGNOSIS — Z955 Presence of coronary angioplasty implant and graft: Secondary | ICD-10-CM | POA: Diagnosis not present

## 2021-11-15 DIAGNOSIS — I2510 Atherosclerotic heart disease of native coronary artery without angina pectoris: Secondary | ICD-10-CM | POA: Diagnosis not present

## 2021-11-15 DIAGNOSIS — Z79899 Other long term (current) drug therapy: Secondary | ICD-10-CM | POA: Diagnosis not present

## 2021-11-15 DIAGNOSIS — A020 Salmonella enteritis: Secondary | ICD-10-CM | POA: Diagnosis present

## 2021-11-15 DIAGNOSIS — I5042 Chronic combined systolic (congestive) and diastolic (congestive) heart failure: Secondary | ICD-10-CM | POA: Diagnosis not present

## 2021-11-15 DIAGNOSIS — I252 Old myocardial infarction: Secondary | ICD-10-CM | POA: Diagnosis not present

## 2021-11-15 LAB — CBC WITH DIFFERENTIAL/PLATELET
Abs Immature Granulocytes: 0.02 10*3/uL (ref 0.00–0.07)
Basophils Absolute: 0 10*3/uL (ref 0.0–0.1)
Basophils Relative: 1 %
Eosinophils Absolute: 0.1 10*3/uL (ref 0.0–0.5)
Eosinophils Relative: 2 %
HCT: 39.6 % (ref 39.0–52.0)
Hemoglobin: 13.5 g/dL (ref 13.0–17.0)
Immature Granulocytes: 0 %
Lymphocytes Relative: 22 %
Lymphs Abs: 1.3 10*3/uL (ref 0.7–4.0)
MCH: 31.3 pg (ref 26.0–34.0)
MCHC: 34.1 g/dL (ref 30.0–36.0)
MCV: 91.9 fL (ref 80.0–100.0)
Monocytes Absolute: 0.4 10*3/uL (ref 0.1–1.0)
Monocytes Relative: 6 %
Neutro Abs: 4.3 10*3/uL (ref 1.7–7.7)
Neutrophils Relative %: 69 %
Platelets: 165 10*3/uL (ref 150–400)
RBC: 4.31 MIL/uL (ref 4.22–5.81)
RDW: 13.1 % (ref 11.5–15.5)
WBC: 6.1 10*3/uL (ref 4.0–10.5)
nRBC: 0 % (ref 0.0–0.2)

## 2021-11-15 LAB — COMPREHENSIVE METABOLIC PANEL
ALT: 11 U/L (ref 0–44)
AST: 18 U/L (ref 15–41)
Albumin: 3.8 g/dL (ref 3.5–5.0)
Alkaline Phosphatase: 51 U/L (ref 38–126)
Anion gap: 8 (ref 5–15)
BUN: 15 mg/dL (ref 8–23)
CO2: 24 mmol/L (ref 22–32)
Calcium: 8.9 mg/dL (ref 8.9–10.3)
Chloride: 106 mmol/L (ref 98–111)
Creatinine, Ser: 0.75 mg/dL (ref 0.61–1.24)
GFR, Estimated: >60 mL/min (ref >60)
Glucose, Bld: 132 mg/dL — ABNORMAL HIGH (ref 70–99)
Potassium: 3.6 mmol/L (ref 3.5–5.1)
Sodium: 138 mmol/L (ref 135–145)
Total Bilirubin: 1 mg/dL (ref 0.3–1.2)
Total Protein: 6.4 g/dL — ABNORMAL LOW (ref 6.5–8.1)

## 2021-11-15 LAB — LACTATE DEHYDROGENASE: LDH: 134 U/L (ref 98–192)

## 2021-11-15 NOTE — Patient Instructions (Addendum)
Patrick Rubio at [facility] ?Discharge Instructions ? ? ?You were seen and examined today by Dr. Delton Coombes. ? ?He reviewed your lab work which is normal/stable. ? ?Call Dr. Roseanne Kaufman office to set up appointment so he can do further testing.  ? ?Return as scheduled in 3 months.  ? ? ?Thank you for choosing [facility] to provide your oncology and hematology care.  To afford each patient quality time with our provider, please arrive at least 15 minutes before your scheduled appointment time.  ? ?If you have a lab appointment with the [facility] please come in thru the Main Entrance and check in at the main information desk. ? ?You need to re-schedule your appointment should you arrive 10 or more minutes late.  We strive to give you quality time with our providers, and arriving late affects you and other patients whose appointments are after yours.  Also, if you no show three or more times for appointments you may be dismissed from the clinic at the providers discretion.     ?Again, thank you for choosing [facility].  Our hope is that these requests will decrease the amount of time that you wait before being seen by our physicians.       ?_____________________________________________________________ ? ?Should you have questions after your visit to [facility], please contact our office at [phone] and follow the prompts.  Our office hours are 8:00 a.m. and 4:30 p.m. Monday - Friday.  Please note that voicemails left after 4:00 p.m. may not be returned until the following business day.  We are closed weekends and major holidays.  You do have access to a nurse 24-7, just call the main number to the clinic [phone] and do not press any options, hold on the line and a nurse will answer the phone.   ? ?For prescription refill requests, have your pharmacy contact our office and allow 72 hours.   ? ?Due to Covid, you will need to  wear a mask upon entering the hospital. If you do not have a mask, a mask will be given to you at the Main Entrance upon arrival. For doctor visits, patients may have 1 support person age 23 or older with them. For treatment visits, patients can not have anyone with them due to social distancing guidelines and our immunocompromised population.  ? ?   ?

## 2021-11-15 NOTE — Progress Notes (Signed)
? ?[facility] ?[address] ?[place], [place] [address] ? ? ?CLINIC:  ?Medical Oncology/Hematology ? ?PCP:  ?Orpah Greek, MD ?[address] [place] [address] ?[phone] ? ? ?REASON FOR VISIT:  ?Follow-up for small lymphocytic lymphoma ? ?PRIOR THERAPY:  ?1. Obinutuzumab & venetoclax x 6 cycles from 06/09/2019 to 10/27/2019. ?2. Venetoclax 200 mg through 06/2020. ? ?NGS Results: not done ? ?CURRENT THERAPY: surveillance ? ?BRIEF ONCOLOGIC HISTORY:  ?Oncology History  ?Small lymphocytic lymphoma ([place])  ?05/04/2019 Initial Diagnosis  ? Small lymphocytic lymphoma ([place]) ? ?  ?06/09/2019 -  Chemotherapy  ? The patient had obinutuzumab (GAZYVA) 100 mg in sodium chloride 0.9 % 100 mL (0.9615 mg/mL) chemo infusion, 100 mg, Intravenous, Once, 6 of 6 cycles ?Administration: 100 mg (06/09/2019), 900 mg (06/10/2019), 1,000 mg (06/16/2019), 1,000 mg (07/07/2019), 1,000 mg (06/23/2019), 1,000 mg (08/04/2019), 1,000 mg (09/01/2019), 1,000 mg (09/29/2019), 1,000 mg (10/27/2019) ? ? for chemotherapy treatment.  ? ?  ? ? ?CANCER STAGING: ?Cancer Staging  ?No matching staging information was found for the patient. ? ?INTERVAL HISTORY:  ?Patrick Rubio, a 61 y.o. male, returns for routine follow-up of his small lymphocytic lymphoma. Patrick Rubio was last seen on 07/17/2021.  ? ?Today he reports feeling good. He reports continued constant abdominal pain for which he takes 1 tablet ibuprofen BID. He denies lumps. He has lost 7 lbs since his last visit. He eats 1 meal daily, and he reports his appetite is poor. He reports occasional diarrhea.  ? ?REVIEW OF SYSTEMS:  ?Review of Systems  ?Constitutional:  Negative for appetite change and fatigue.  ?Gastrointestinal:  Positive for abdominal pain and diarrhea. Rectal pain: 5/10. ?All other systems reviewed and are negative. ? ?PAST MEDICAL/SURGICAL HISTORY:  ?Past Medical History:  ?Diagnosis Date  ? CAD (coronary artery disease)   ? GERD (gastroesophageal reflux disease)   ?  HTN (hypertension)   ? Hypercholesterolemia   ? MI (myocardial infarction) ([place]) 2003  ? ?Past Surgical History:  ?Procedure Laterality Date  ? AXILLARY LYMPH NODE BIOPSY Left 04/27/2019  ? Procedure: AXILLARY LYMPH NODE BIOPSY;  Surgeon: Aviva Signs, MD;  Location: [facility] ORS;  Service: General;  Laterality: Left;  ? BIOPSY  11/26/2018  ? Procedure: BIOPSY;  Surgeon: Daneil Dolin, MD;  Location: [facility] ENDO SUITE;  Service: Endoscopy;;  ? CHOLECYSTECTOMY N/A 05/18/2019  ? Procedure: LAPAROSCOPIC CHOLECYSTECTOMY;  Surgeon: Aviva Signs, MD;  Location: [facility] ORS;  Service: General;  Laterality: N/A;  ? CORONARY ANGIOPLASTY WITH STENT PLACEMENT    ? 2003  ? ESOPHAGOGASTRODUODENOSCOPY N/A 11/26/2018  ? mild erosive reflux esophagitis, gastric erythema. Negative H.pylori   ? INGUINAL HERNIA REPAIR Right 04/27/2019  ? Procedure: HERNIA REPAIR INGUINAL ADULT;  Surgeon: Aviva Signs, MD;  Location: [facility] ORS;  Service: General;  Laterality: Right;  ? KNEE SURGERY Right   ? lumbar fusion with cage  07/2017  ? NASAL SINUS SURGERY    ? TONSILLECTOMY AND ADENOIDECTOMY    ? ? ?SOCIAL HISTORY:  ?Social History  ? ?Socioeconomic History  ? Marital status: Divorced  ?  Spouse name: Not on file  ? Number of children: 2  ? Years of education: Not on file  ? Highest education level: Not on file  ?Occupational History  ? Occupation: employed  ?  Comment: part time at [employer]  ?Tobacco Use  ? Smoking status: Every Day  ?  Packs/day: 0.50  ?  Years: 25.00  ?  Pack years: 12.50  ?  Types: Cigarettes  ? Smokeless tobacco: Never  ?Vaping Use  ? Vaping Use: Never used  ?Substance and Sexual Activity  ? Alcohol use: Yes  ?  Comment: occ  ? Drug use: Never  ? Sexual activity: Yes  ?Other Topics Concern  ? Not on file  ?Social History Narrative  ? Not on file  ? ?Social Determinants of Health  ? ?Financial Resource Strain: Not on file  ?Food Insecurity: Not on file  ?Transportation Needs: Not on file  ?Physical Activity: Not on file  ?Stress: Not on  file  ?Social Connections: Not on file  ?Intimate Partner Violence: Not on file  ? ? ?FAMILY HISTORY:  ?Family History  ?Problem Relation Age of Onset  ? Colon polyps Father   ?     thinks he may have had polyps, possibly in his 50s.   ? Heart attack Father   ? Dementia Mother   ? Hypotension Mother   ? Stroke Sister   ? Diabetes Maternal Grandmother   ? Stroke Maternal Grandfather   ? Cancer Paternal Grandmother   ? Congestive Heart Failure Paternal Grandfather   ? Kidney cancer Daughter   ? Colon cancer Neg Hx   ? Pancreatic cancer Neg Hx   ? Liver disease Neg Hx   ? ? ?CURRENT MEDICATIONS:  ?Current Outpatient Medications  ?Medication Sig Dispense Refill  ? acetaminophen (TYLENOL) 650 MG CR tablet Take 650 mg by mouth in the morning and at bedtime.    ? aspirin EC 81 MG tablet Take 81 mg by mouth every Monday, Wednesday, and Friday.    ? baclofen (LIORESAL) 10 MG tablet Take 10 mg by mouth 2 (two) times daily.    ? carvedilol (COREG) 6.25 MG tablet Take 6.25 mg by mouth 2 (two) times daily.    ? celecoxib (CELEBREX) 200 MG capsule Take 200 mg by mouth 2 (two) times daily.    ? cholecalciferol (VITAMIN D3) 25 MCG (1000 UNIT) tablet Take 1,000 Units by mouth in the morning and at bedtime.    ? escitalopram (LEXAPRO) 20 MG tablet Take 20 mg by mouth daily.     ? gabapentin (NEURONTIN) 300 MG capsule Take 300 mg by mouth 2 (two) times daily.    ? nitroGLYCERIN (NITROSTAT) 0.4 MG SL tablet Place 0.4 mg under the tongue every 5 (five) minutes as needed for chest pain.    ? pantoprazole (PROTONIX) 40 MG tablet TAKE 1 TABLET BY MOUTH EVERY DAY 90 tablet 3  ? Pitavastatin Calcium (LIVALO) 4 MG TABS Take 4 mg by mouth daily.    ? polyethylene glycol-electrolytes (NULYTELY) 420 g solution As directed 4000 mL 0  ? prasugrel (EFFIENT) 10 MG TABS tablet Take 10 mg by mouth daily.    ? sacubitril-valsartan (ENTRESTO) 24-26 MG Take 1 tablet by mouth 2 (two) times daily.    ? ?No current facility-administered medications for this  visit.  ? ? ?ALLERGIES:  ?No Known Allergies ? ?PHYSICAL EXAM:  ?Performance status (ECOG): 1 - Symptomatic but completely ambulatory ? ?There were no vitals filed for this visit. ?Wt Readings from Last 3 Encounters:  ?07/18/21 196 lb 6.4 oz (89.1 kg)  ?07/17/21 194 lb 14.4 oz (88.4 kg)  ?03/14/21 202 lb 12.8 oz (92 kg)  ? ?Physical Exam ?Vitals reviewed.  ?Constitutional:   ?   Appearance: Normal appearance.  ?Cardiovascular:  ?   Rate and Rhythm: Normal rate and regular rhythm.  ?   Pulses: Normal pulses.  ?  Heart sounds: Normal heart sounds.  ?Pulmonary:  ?   Effort: Pulmonary effort is normal.  ?   Breath sounds: Normal breath sounds.  ?Abdominal:  ?   Palpations: Abdomen is soft. There is no hepatomegaly, splenomegaly or mass.  ?   Tenderness: There is no abdominal tenderness.  ?Lymphadenopathy:  ?   Cervical: No cervical adenopathy.  ?   Right cervical: No superficial, deep or posterior cervical adenopathy. ?   Left cervical: No superficial, deep or posterior cervical adenopathy.  ?   Upper Body:  ?   Right upper body: No supraclavicular or axillary adenopathy.  ?   Left upper body: No supraclavicular or axillary adenopathy.  ?   Lower Body: No right inguinal adenopathy. No left inguinal adenopathy.  ?Neurological:  ?   General: No focal deficit present.  ?   Mental Status: He is alert and oriented to person, place, and time.  ?Psychiatric:     ?   Mood and Affect: Mood normal.     ?   Behavior: Behavior normal.  ?  ? ?LABORATORY DATA:  ?I have reviewed the labs as listed.  ? ?  Latest Ref Rng & Units 11/15/2021  ?  9:43 AM 07/17/2021  ? 10:35 AM 02/27/2021  ?  8:08 AM  ?CBC  ?WBC 4.0 - 10.5 K/uL 6.1   7.1   7.5    ?Hemoglobin 13.0 - 17.0 g/dL 13.5   13.2   13.9    ?Hematocrit 39.0 - 52.0 % 39.6   38.7   41.1    ?Platelets 150 - 400 K/uL 165   171   191    ? ? ?  Latest Ref Rng & Units 11/15/2021  ?  9:43 AM 07/17/2021  ? 10:35 AM 02/27/2021  ?  8:08 AM  ?CMP  ?Glucose 70 - 99 mg/dL 132   79   89    ?BUN 8 - 23  mg/dL '15   14   14    '$ ?Creatinine 0.61 - 1.24 mg/dL 0.75   0.71   0.72    ?Sodium 135 - 145 mmol/L 138   134   137    ?Potassium 3.5 - 5.1 mmol/L 3.6   3.9   4.3    ?Chloride 98 - 111 mmol/L 106   102

## 2021-11-18 ENCOUNTER — Other Ambulatory Visit: Payer: Self-pay

## 2021-11-18 ENCOUNTER — Emergency Department (HOSPITAL_COMMUNITY): Payer: 59

## 2021-11-18 ENCOUNTER — Observation Stay (HOSPITAL_COMMUNITY)
Admission: EM | Admit: 2021-11-18 | Discharge: 2021-11-20 | Disposition: A | Discharge status: Home / Self Care | Payer: 59 | Attending: Internal Medicine | Admitting: Internal Medicine

## 2021-11-18 ENCOUNTER — Encounter (HOSPITAL_COMMUNITY): Payer: Self-pay

## 2021-11-18 DIAGNOSIS — M549 Dorsalgia, unspecified: Secondary | ICD-10-CM | POA: Diagnosis present

## 2021-11-18 DIAGNOSIS — A020 Salmonella enteritis: Principal | ICD-10-CM

## 2021-11-18 DIAGNOSIS — Z981 Arthrodesis status: Secondary | ICD-10-CM

## 2021-11-18 DIAGNOSIS — Z8249 Family history of ischemic heart disease and other diseases of the circulatory system: Secondary | ICD-10-CM | POA: Diagnosis not present

## 2021-11-18 DIAGNOSIS — K429 Umbilical hernia without obstruction or gangrene: Secondary | ICD-10-CM | POA: Diagnosis present

## 2021-11-18 DIAGNOSIS — C8300 Small cell B-cell lymphoma, unspecified site: Secondary | ICD-10-CM | POA: Diagnosis not present

## 2021-11-18 DIAGNOSIS — I10 Essential (primary) hypertension: Secondary | ICD-10-CM

## 2021-11-18 DIAGNOSIS — E782 Mixed hyperlipidemia: Secondary | ICD-10-CM

## 2021-11-18 DIAGNOSIS — C9591 Leukemia, unspecified, in remission: Secondary | ICD-10-CM | POA: Diagnosis present

## 2021-11-18 DIAGNOSIS — Z791 Long term (current) use of non-steroidal anti-inflammatories (NSAID): Secondary | ICD-10-CM

## 2021-11-18 DIAGNOSIS — Z79899 Other long term (current) drug therapy: Secondary | ICD-10-CM | POA: Diagnosis not present

## 2021-11-18 DIAGNOSIS — K625 Hemorrhage of anus and rectum: Principal | ICD-10-CM

## 2021-11-18 DIAGNOSIS — M25551 Pain in right hip: Secondary | ICD-10-CM | POA: Insufficient documentation

## 2021-11-18 DIAGNOSIS — R7401 Elevation of levels of liver transaminase levels: Secondary | ICD-10-CM | POA: Diagnosis present

## 2021-11-18 DIAGNOSIS — G8929 Other chronic pain: Secondary | ICD-10-CM | POA: Diagnosis present

## 2021-11-18 DIAGNOSIS — K219 Gastro-esophageal reflux disease without esophagitis: Secondary | ICD-10-CM

## 2021-11-18 DIAGNOSIS — I110 Hypertensive heart disease with heart failure: Secondary | ICD-10-CM | POA: Insufficient documentation

## 2021-11-18 DIAGNOSIS — R1013 Epigastric pain: Secondary | ICD-10-CM

## 2021-11-18 DIAGNOSIS — Z85028 Personal history of other malignant neoplasm of stomach: Secondary | ICD-10-CM | POA: Diagnosis not present

## 2021-11-18 DIAGNOSIS — Z7982 Long term (current) use of aspirin: Secondary | ICD-10-CM | POA: Diagnosis not present

## 2021-11-18 DIAGNOSIS — I5030 Unspecified diastolic (congestive) heart failure: Secondary | ICD-10-CM

## 2021-11-18 DIAGNOSIS — I252 Old myocardial infarction: Secondary | ICD-10-CM | POA: Insufficient documentation

## 2021-11-18 DIAGNOSIS — R197 Diarrhea, unspecified: Secondary | ICD-10-CM | POA: Diagnosis not present

## 2021-11-18 DIAGNOSIS — I2510 Atherosclerotic heart disease of native coronary artery without angina pectoris: Secondary | ICD-10-CM

## 2021-11-18 DIAGNOSIS — I5042 Chronic combined systolic (congestive) and diastolic (congestive) heart failure: Secondary | ICD-10-CM | POA: Diagnosis present

## 2021-11-18 DIAGNOSIS — K921 Melena: Secondary | ICD-10-CM | POA: Diagnosis not present

## 2021-11-18 DIAGNOSIS — D638 Anemia in other chronic diseases classified elsewhere: Secondary | ICD-10-CM | POA: Diagnosis present

## 2021-11-18 DIAGNOSIS — D649 Anemia, unspecified: Secondary | ICD-10-CM | POA: Insufficient documentation

## 2021-11-18 DIAGNOSIS — K922 Gastrointestinal hemorrhage, unspecified: Principal | ICD-10-CM | POA: Diagnosis present

## 2021-11-18 DIAGNOSIS — R109 Unspecified abdominal pain: Secondary | ICD-10-CM

## 2021-11-18 DIAGNOSIS — Z9221 Personal history of antineoplastic chemotherapy: Secondary | ICD-10-CM | POA: Diagnosis not present

## 2021-11-18 DIAGNOSIS — F17210 Nicotine dependence, cigarettes, uncomplicated: Secondary | ICD-10-CM | POA: Insufficient documentation

## 2021-11-18 DIAGNOSIS — M5440 Lumbago with sciatica, unspecified side: Secondary | ICD-10-CM

## 2021-11-18 DIAGNOSIS — Z955 Presence of coronary angioplasty implant and graft: Secondary | ICD-10-CM | POA: Diagnosis not present

## 2021-11-18 DIAGNOSIS — I509 Heart failure, unspecified: Secondary | ICD-10-CM

## 2021-11-18 HISTORY — DX: Malignant neoplasm of stomach, unspecified: C16.9

## 2021-11-18 HISTORY — DX: Small cell B-cell lymphoma, unspecified site: C83.00

## 2021-11-18 LAB — CBC
HCT: 38 % — ABNORMAL LOW (ref 39.0–52.0)
Hemoglobin: 12.8 g/dL — ABNORMAL LOW (ref 13.0–17.0)
MCH: 30.2 pg (ref 26.0–34.0)
MCHC: 33.7 g/dL (ref 30.0–36.0)
MCV: 89.6 fL (ref 80.0–100.0)
Platelets: 150 10*3/uL (ref 150–400)
RBC: 4.24 MIL/uL (ref 4.22–5.81)
RDW: 13.2 % (ref 11.5–15.5)
WBC: 5.1 10*3/uL (ref 4.0–10.5)
nRBC: 0 % (ref 0.0–0.2)

## 2021-11-18 LAB — COMPREHENSIVE METABOLIC PANEL
ALT: 11 U/L (ref 0–44)
AST: 13 U/L — ABNORMAL LOW (ref 15–41)
Albumin: 3.7 g/dL (ref 3.5–5.0)
Alkaline Phosphatase: 54 U/L (ref 38–126)
Anion gap: 6 (ref 5–15)
BUN: 14 mg/dL (ref 8–23)
CO2: 22 mmol/L (ref 22–32)
Calcium: 8.5 mg/dL — ABNORMAL LOW (ref 8.9–10.3)
Chloride: 108 mmol/L (ref 98–111)
Creatinine, Ser: 0.64 mg/dL (ref 0.61–1.24)
GFR, Estimated: >60 mL/min (ref >60)
Glucose, Bld: 95 mg/dL (ref 70–99)
Potassium: 3.5 mmol/L (ref 3.5–5.1)
Sodium: 136 mmol/L (ref 135–145)
Total Bilirubin: 0.4 mg/dL (ref 0.3–1.2)
Total Protein: 6.2 g/dL — ABNORMAL LOW (ref 6.5–8.1)

## 2021-11-18 LAB — PROTIME-INR
INR: 1.1 (ref 0.8–1.2)
Prothrombin Time: 13.8 seconds (ref 11.4–15.2)

## 2021-11-18 LAB — MAGNESIUM: Magnesium: 2 mg/dL (ref 1.7–2.4)

## 2021-11-18 LAB — TYPE AND SCREEN
ABO/RH(D): A NEG
Antibody Screen: NEGATIVE

## 2021-11-18 MED ORDER — PANTOPRAZOLE SODIUM 40 MG IV SOLR
40.0000 mg | Freq: Once | INTRAVENOUS | Status: AC
  Administered 2021-11-19: 40 mg via INTRAVENOUS
  Filled 2021-11-18: qty 10

## 2021-11-18 MED ORDER — IOHEXOL 300 MG/ML  SOLN
100.0000 mL | Freq: Once | INTRAMUSCULAR | Status: AC | PRN
Start: 2021-11-18 — End: 2021-11-18
  Administered 2021-11-18: 100 mL via INTRAVENOUS

## 2021-11-18 MED ORDER — MORPHINE SULFATE (PF) 4 MG/ML IV SOLN
4.0000 mg | Freq: Once | INTRAVENOUS | Status: AC
  Administered 2021-11-18: 4 mg via INTRAVENOUS
  Filled 2021-11-18: qty 1

## 2021-11-18 MED ORDER — SODIUM CHLORIDE 0.9 % IV SOLN: INTRAVENOUS | Status: DC

## 2021-11-18 MED ORDER — PANTOPRAZOLE INFUSION (NEW) - SIMPLE MED
8.0000 mg/h | INTRAVENOUS | Status: DC
  Administered 2021-11-19 – 2021-11-20 (×4): 8 mg/h via INTRAVENOUS
  Filled 2021-11-18 (×2): qty 100
  Filled 2021-11-18: qty 80
  Filled 2021-11-18: qty 100
  Filled 2021-11-18: qty 80
  Filled 2021-11-18 (×3): qty 100
  Filled 2021-11-18: qty 80

## 2021-11-18 MED ORDER — PANTOPRAZOLE SODIUM 40 MG IV SOLR
40.0000 mg | Freq: Once | INTRAVENOUS | Status: AC
  Administered 2021-11-18: 40 mg via INTRAVENOUS
  Filled 2021-11-18: qty 10

## 2021-11-18 MED ORDER — SODIUM CHLORIDE 0.9 % IV BOLUS
1000.0000 mL | Freq: Once | INTRAVENOUS | Status: AC
  Administered 2021-11-18: 1000 mL via INTRAVENOUS

## 2021-11-18 MED ORDER — PANTOPRAZOLE SODIUM 40 MG IV SOLR
40.0000 mg | Freq: Two times a day (BID) | INTRAVENOUS | Status: DC
Start: 2021-11-22 — End: 2021-11-20

## 2021-11-18 MED ORDER — HYDROMORPHONE HCL 1 MG/ML IJ SOLN: 0.5000 mg | INTRAMUSCULAR | Status: DC | PRN

## 2021-11-18 NOTE — ED Provider Notes (Signed)
?[facility] ?Provider Note ? ? ?CSN: [id number] ?Arrival date & time: 11/18/21  1701 ? ?  ? ?History ? ?Chief Complaint  ?Patient presents with  ? Rectal Bleeding  ? ? ?Patrick Rubio is a 61 y.o. male.  He has a history of gastric lymphoma, has had chemotherapy in the past follows with Dr. Delton Coombes.  Also has had endoscopies by Dr. Gala Romney.  Complaining of 2 days of epigastric abdominal pain associated with multiple episodes of diarrhea and bright red blood.  Says he had probably 10 episodes yesterday and 5 today.  Generalized fatigue.  No chest pain or shortness of breath.  He is on blood thinners and has a history of PVCs.  Had a fever today ? ?The history is provided by the patient.  ?Rectal Bleeding ?Quality:  Bright red ?Amount:  Moderate ?Duration:  2 days ?Timing:  Intermittent ?Chronicity:  Recurrent ?Context: defecation   ?Similar prior episodes: yes   ?Relieved by:  None tried ?Worsened by:  Nothing ?Ineffective treatments:  None tried ?Associated symptoms: abdominal pain and fever   ?Associated symptoms: no epistaxis, no hematemesis, no loss of consciousness and no vomiting   ?Abdominal pain:  ?  Location:  Epigastric ?  Quality: aching and burning   ?  Severity:  Moderate ?  Onset quality:  Gradual ?  Duration:  2 days ?  Timing:  Constant ?  Progression:  Unchanged ?  Chronicity:  Recurrent ?Risk factors: anticoagulant use   ? ?  ? ?Home Medications ?Prior to Admission medications   ?Medication Sig Start Date End Date Taking? Authorizing Provider  ?acetaminophen (TYLENOL) 650 MG CR tablet Take 650 mg by mouth in the morning and at bedtime.    [provider]  ?aspirin EC 81 MG tablet Take 81 mg by mouth every Monday, Wednesday, and Friday.    [provider]  ?baclofen (LIORESAL) 10 MG tablet Take 10 mg by mouth 2 (two) times daily. 07/09/21   [provider]  ?carvedilol (COREG) 6.25 MG tablet Take 6.25 mg by mouth 2 (two) times daily. 05/25/20   [provider]  ?celecoxib (CELEBREX) 200 MG capsule Take 200 mg by mouth 2 (two) times daily.    [provider]  ?cholecalciferol (VITAMIN D3) 25 MCG (1000 UNIT) tablet Take 1,000 Units by mouth in the morning and at bedtime.    [provider]  ?escitalopram (LEXAPRO) 20 MG tablet Take 20 mg by mouth daily.  10/06/18   [provider]  ?gabapentin (NEURONTIN) 300 MG capsule Take 300 mg by mouth 2 (two) times daily. 06/28/21   [provider]  ?nitroGLYCERIN (NITROSTAT) 0.4 MG SL tablet Place 0.4 mg under the tongue every 5 (five) minutes as needed for chest pain.    [provider]  ?pantoprazole (PROTONIX) 40 MG tablet TAKE 1 TABLET BY MOUTH EVERY DAY 08/30/21   [name], NP  ?Pitavastatin Calcium (LIVALO) 4 MG TABS Take 4 mg by mouth daily.    [provider]  ?polyethylene glycol-electrolytes (NULYTELY) 420 g solution As directed 08/10/21   Rourk, [name], MD  ?prasugrel (EFFIENT) 10 MG TABS tablet Take 10 mg by mouth daily. 10/02/19   [provider]  ?sacubitril-valsartan (ENTRESTO) 24-26 MG Take 1 tablet by mouth 2 (two) times daily.    [provider]  ?   ? ?Allergies    ?Patient has no known allergies.   ? ?Review of Systems   ?Review of Systems  ?  Constitutional:  Positive for fever.  ?HENT:  Negative for nosebleeds.   ?Respiratory:  Negative for shortness of breath.   ?Cardiovascular:  Negative for chest pain.  ?Gastrointestinal:  Positive for abdominal pain and hematochezia. Negative for hematemesis and vomiting.  ?Genitourinary:  Negative for dysuria.  ?Musculoskeletal:  Negative for back pain.  ?Skin:  Negative for rash.  ?Neurological:  Negative for loss of consciousness and headaches.  ? ?Physical Exam ?Updated Vital Signs ?BP (!) 144/74   Pulse (!) 47   Temp 99.6 ?F (37.6 ?C) (Oral)   Resp 15   Ht '5\' 8"'$  (1.727 m)   Wt 84.8 kg   SpO2 97%   BMI 28.43 kg/m?  ?Physical Exam ?Vitals and nursing note reviewed.   ?Constitutional:   ?   General: He is not in acute distress. ?   Appearance: Normal appearance. He is well-developed.  ?HENT:  ?   Head: Normocephalic and atraumatic.  ?Eyes:  ?   Conjunctiva/sclera: Conjunctivae normal.  ?Cardiovascular:  ?   Rate and Rhythm: Normal rate and regular rhythm.  ?   Heart sounds: No murmur heard. ?Pulmonary:  ?   Effort: Pulmonary effort is normal. No respiratory distress.  ?   Breath sounds: Normal breath sounds.  ?Abdominal:  ?   Palpations: Abdomen is soft.  ?   Tenderness: There is abdominal tenderness (epigastric). There is no guarding or rebound.  ?Musculoskeletal:     ?   General: No swelling. Normal range of motion.  ?   Cervical back: Neck supple.  ?Skin: ?   General: Skin is warm and dry.  ?   Capillary Refill: Capillary refill takes less than 2 seconds.  ?Neurological:  ?   General: No focal deficit present.  ?   Mental Status: He is alert.  ?   Sensory: No sensory deficit.  ?   Motor: No weakness.  ? ? ?ED Results / Procedures / Treatments   ?Labs ?(all labs ordered are listed, but only abnormal results are displayed) ?Labs Reviewed  ?COMPREHENSIVE METABOLIC PANEL - Abnormal; Notable for the following components:  ?    Result Value  ? Calcium 8.5 (*)   ? Total Protein 6.2 (*)   ? AST 13 (*)   ? All other components within normal limits  ?CBC - Abnormal; Notable for the following components:  ? Hemoglobin 12.8 (*)   ? HCT 38.0 (*)   ? All other components within normal limits  ?COMPREHENSIVE METABOLIC PANEL - Abnormal; Notable for the following components:  ? Glucose, Bld 101 (*)   ? Calcium 8.3 (*)   ? Total Protein 5.9 (*)   ? Albumin 3.4 (*)   ? AST 137 (*)   ? ALT 112 (*)   ? Alkaline Phosphatase 171 (*)   ? All other components within normal limits  ?CBC - Abnormal; Notable for the following components:  ? RBC 4.01 (*)   ? Hemoglobin 12.3 (*)   ? HCT 36.5 (*)   ? Platelets 136 (*)   ? All other components within normal limits  ?GASTROINTESTINAL PANEL BY PCR, STOOL  (REPLACES STOOL CULTURE)  ?PROTIME-INR  ?MAGNESIUM  ?MAGNESIUM  ?PHOSPHORUS  ?LIPASE, BLOOD  ?HIV ANTIBODY (ROUTINE TESTING W REFLEX)  ?POC OCCULT BLOOD, ED  ?TYPE AND SCREEN  ? ? ?EKG ?EKG Interpretation ? ?Date/Time:  Saturday November 18 2021 17:19:57 EDT ?Ventricular Rate:  71 ?PR Interval:  184 ?QRS Duration: 126 ?QT Interval:  414 ?QTC Calculation: 449 ?R Axis:   -  4 ?Text Interpretation: Sinus rhythm with frequent Premature ventricular complexes Non-specific intra-ventricular conduction block Minimal voltage criteria for LVH, may be normal variant ( Cornell product ) Abnormal ECG When compared with ECG of 23-Apr-2019 10:06, Sinus rhythm has replaced Ectopic atrial rhythm increased ectopy Confirmed by Aletta Edouard [phone]) on 11/18/2021 5:35:05 PM ? ?Radiology ?CT Abdomen Pelvis W Contrast ? ?Result Date: 11/18/2021 ?CLINICAL DATA:  Abdominal pain, acute, nonlocalized EXAM: CT ABDOMEN AND PELVIS WITH CONTRAST TECHNIQUE: Multidetector CT imaging of the abdomen and pelvis was performed using the standard protocol following bolus administration of intravenous contrast. RADIATION DOSE REDUCTION: This exam was performed according to the departmental dose-optimization program which includes automated exposure control, adjustment of the mA and/or kV according to patient size and/or use of iterative reconstruction technique. CONTRAST:  166m OMNIPAQUE IOHEXOL 300 MG/ML  SOLN COMPARISON:  None. FINDINGS: Lower chest: No acute abnormality. Hepatobiliary: No focal liver abnormality. Status post cholecystectomy. No biliary dilatation . Pancreas: No focal lesion. Normal pancreatic contour. No surrounding inflammatory changes. No main pancreatic ductal dilatation. Spleen: Normal in size without focal abnormality. Adrenals/Urinary Tract: No adrenal nodule bilaterally. Slightly malrotated right kidney. Bilateral kidneys enhance symmetrically. Subcentimeter hypodensities are too small to characterize. Plain this lesion within left  kidney likely represents a simple renal cyst. No hydronephrosis. No hydroureter. The urinary bladder is unremarkable. On delayed imaging, there is no urothelial wall thickening and there are no filling defects in the opacified

## 2021-11-18 NOTE — ED Triage Notes (Signed)
Reports rectal bleeding started yesterday am, reports he is on effient blood thinner and plavix with a hx of stomach cancer.  ?

## 2021-11-18 NOTE — H&P (Signed)
?History and Physical  ? ? ?Patient: Patrick Rubio EQA:[id number] DOB: 07-31-1960 ?DOA: 11/18/2021 ?DOS: the patient was seen and examined on 11/18/2021 ?PCP: Orpah Greek, MD  ?Patient coming from: Home ? ?Chief Complaint:  ?Chief Complaint  ?Patient presents with  ? Rectal Bleeding  ? ?HPI: Patrick Rubio is a 61 y.o. 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  He denies chest pain, shortness of breath, headache, blurry vision, vomiting. ? ? ?ED Course:  ?In the emergency department, he was initially bradycardic, BP was 141/102, other vital signs are within normal range.  Work-up in the ED showed normocytic anemia, BMP was normal, magnesium 2.0.  Patient was treated with IV morphine, Protonix was given and IV hydration was provided. ?Gastroenterologist was consulted and recommended admitting patient with plan to follow-up with patient in the morning per ED physician ? ?Review of Systems: ?Review of systems as noted in the HPI. All other systems reviewed and are negative. ? ? ?Past Medical History:  ?Diagnosis Date  ? CAD (coronary artery disease)   ? GERD (gastroesophageal reflux disease)   ? HTN (hypertension)   ? Hypercholesterolemia   ? MI (myocardial infarction) ([place]) 2003  ? Small lymphocytic lymphoma ([place])   ? Stomach cancer ([place])   ? ?Past Surgical History:  ?Procedure Laterality Date  ? AXILLARY LYMPH NODE BIOPSY Left 04/27/2019  ? Procedure: AXILLARY LYMPH NODE BIOPSY;  Surgeon: Aviva Signs, MD;  Location: [facility]S;  Service: General;  Laterality: Left;  ? BIOPSY  11/26/2018  ? Procedure: BIOPSY;  Surgeon: Daneil Dolin, MD;  Location: [facility] SUITE;  Service: Endoscopy;;  ? CHOLECYSTECTOMY  N/A 05/18/2019  ? Procedure: LAPAROSCOPIC CHOLECYSTECTOMY;  Surgeon: Aviva Signs, MD;  Location: [facility];  Service: General;  Laterality: N/A;  ? CORONARY ANGIOPLASTY WITH STENT PLACEMENT    ? 2003  ? ESOPHAGOGASTRODUODENOSCOPY N/A 11/26/2018  ? mild erosive reflux esophagitis, gastric erythema. Negative H.pylori   ? INGUINAL HERNIA REPAIR Right 04/27/2019  ? Procedure: HERNIA REPAIR INGUINAL ADULT;  Surgeon: Aviva Signs, MD;  Location: [facility];  Service: General;  Laterality: Right;  ? KNEE SURGERY Right   ? lumbar fusion with cage  07/2017  ? NASAL SINUS SURGERY    ? TONSILLECTOMY AND ADENOIDECTOMY    ? ? ?Social History:  reports that he has been smoking cigarettes. He has a 12.50 pack-year smoking history. He has never used smokeless tobacco. He reports current alcohol use. He reports that he does not use drugs. ? ? ?No Known Allergies ? ?Family History  ?Problem Relation Age of Onset  ? Colon polyps Father   ?     thinks he may have had polyps, possibly in his 38s.   ? Heart attack Father   ? Dementia Mother   ? Hypotension Mother   ? Stroke Sister   ? Diabetes Maternal Grandmother   ? Stroke Maternal Grandfather   ? Cancer Paternal Grandmother   ? Congestive Heart Failure Paternal Grandfather   ? Kidney cancer Daughter   ? Colon cancer Neg Hx   ? Pancreatic cancer Neg Hx   ? Liver disease Neg Hx   ?  ? ?Prior to Admission medications   ?Medication Sig Start Date End Date Taking?  Authorizing Provider  ?acetaminophen (TYLENOL) 650 MG CR tablet Take 650 mg by mouth in the morning and at bedtime.   Yes [provider]  ?aspirin EC 81 MG tablet Take 81 mg by mouth every Monday, Wednesday, and Friday.   Yes [provider]  ?baclofen (LIORESAL) 10 MG tablet Take 10 mg by mouth 2 (two) times daily. 07/09/21  Yes [provider]  ?carvedilol (COREG) 6.25 MG tablet Take 6.25 mg by mouth 2 (two) times daily. 05/25/20  Yes [provider]  ?cholecalciferol (VITAMIN D3) 25 MCG (1000  UNIT) tablet Take 1,000 Units by mouth in the morning and at bedtime.   Yes [provider]  ?escitalopram (LEXAPRO) 20 MG tablet Take 20 mg by mouth daily.  10/06/18  Yes [provider]  ?gabapentin (NEURONTIN) 300 MG capsule Take 300 mg by mouth 2 (two) times daily. 06/28/21  Yes [provider]  ?nitroGLYCERIN (NITROSTAT) 0.4 MG SL tablet Place 0.4 mg under the tongue every 5 (five) minutes as needed for chest pain.   Yes [provider]  ?pantoprazole (PROTONIX) 40 MG tablet TAKE 1 TABLET BY MOUTH EVERY DAY 08/30/21  Yes [name], NP  ?Pitavastatin Calcium (LIVALO) 4 MG TABS Take 4 mg by mouth every evening.   Yes [provider]  ?prasugrel (EFFIENT) 10 MG TABS tablet Take 10 mg by mouth daily. 10/02/19  Yes [provider]  ?sacubitril-valsartan (ENTRESTO) 24-26 MG Take 1 tablet by mouth 2 (two) times daily.   Yes [provider]  ?polyethylene glycol-electrolytes (NULYTELY) 420 g solution As directed ?Patient not taking: Reported on 11/18/2021 08/10/21   Daneil Dolin, MD  ? ? ?Physical Exam: ?BP (!) 143/78 (BP Location: Right Arm)   Pulse (!) 57   Temp 98.2 ?F (36.8 ?C) (Oral)   Resp 18   Ht '5\' 10"'$  (1.778 m)   Wt 83.3 kg   SpO2 99%   BMI 26.35 kg/m?  ? ?General: 61 y.o. year-old male well developed well nourished in no acute distress.  Alert and oriented x3. ?HEENT: NCAT, EOMI ?Neck: Supple, trachea medial ?Cardiovascular: Regular rate and rhythm with no rubs or gallops.  No thyromegaly or JVD noted.  No lower extremity edema. 2/4 pulses in all 4 extremities. ?Respiratory: Clear to auscultation with no wheezes or rales. Good inspiratory effort. ?Abdomen: Soft, tender to palpation in the epigastric area.  Nondistended with normal bowel sounds x4 quadrants. ?Muskuloskeletal: No cyanosis, clubbing or edema noted bilaterally ?Neuro: CN II-XII intact, strength 5/5 x 4, sensation, reflexes intact ?Skin: No ulcerative lesions noted or  rashes ?Psychiatry: Judgement and insight appear normal. Mood is appropriate for condition and setting ?   ?   ?   ?Labs on Admission:  ?Basic Metabolic Panel: ?Recent Labs  ?Lab 11/15/21 ?0943 11/18/21 ?1754  ?NA 138 136  ?K 3.6 3.5  ?CL 106 108  ?CO2 24 22  ?GLUCOSE 132* 95  ?BUN 15 14  ?CREATININE 0.75 0.64  ?CALCIUM 8.9 8.5*  ?MG  --  2.0  ? ?Liver Function Tests: ?Recent Labs  ?Lab 11/15/21 ?0943 11/18/21 ?1754  ?AST 18 13*  ?ALT 11 11  ?ALKPHOS 51 54  ?BILITOT 1.0 0.4  ?PROT 6.4* 6.2*  ?ALBUMIN 3.8 3.7  ? ?No results for input(s): LIPASE, AMYLASE in the last 168 hours. ?No results for input(s): AMMONIA in the last 168 hours. ?CBC: ?Recent Labs  ?Lab 11/15/21 ?0943 11/18/21 ?1754  ?WBC 6.1 5.1  ?NEUTROABS 4.3  --   ?  HGB 13.5 12.8*  ?HCT 39.6 38.0*  ?MCV 91.9 89.6  ?PLT 165 150  ? ?Cardiac Enzymes: ?No results for input(s): CKTOTAL, CKMB, CKMBINDEX, TROPONINI in the last 168 hours. ? ?BNP (last 3 results) ?No results for input(s): BNP in the last 8760 hours. ? ?ProBNP (last 3 results) ?No results for input(s): PROBNP in the last 8760 hours. ? ?CBG: ?No results for input(s): GLUCAP in the last 168 hours. ? ?Radiological Exams on Admission: ?CT Abdomen Pelvis W Contrast ? ?Result Date: 11/18/2021 ?CLINICAL DATA:  Abdominal pain, acute, nonlocalized EXAM: CT ABDOMEN AND PELVIS WITH CONTRAST TECHNIQUE: Multidetector CT imaging of the abdomen and pelvis was performed using the standard protocol following bolus administration of intravenous contrast. RADIATION DOSE REDUCTION: This exam was performed according to the departmental dose-optimization program which includes automated exposure control, adjustment of the mA and/or kV according to patient size and/or use of iterative reconstruction technique. CONTRAST:  153m OMNIPAQUE IOHEXOL 300 MG/ML  SOLN COMPARISON:  None. FINDINGS: Lower chest: No acute abnormality. Hepatobiliary: No focal liver abnormality. Status post cholecystectomy. No biliary dilatation . Pancreas:  No focal lesion. Normal pancreatic contour. No surrounding inflammatory changes. No main pancreatic ductal dilatation. Spleen: Normal in size without focal abnormality. Adrenals/Urinary Tract: No adrenal nodule bilaterall

## 2021-11-19 ENCOUNTER — Encounter (HOSPITAL_COMMUNITY): Payer: Self-pay | Admitting: Internal Medicine

## 2021-11-19 ENCOUNTER — Inpatient Hospital Stay (HOSPITAL_COMMUNITY): Payer: 59

## 2021-11-19 DIAGNOSIS — I5042 Chronic combined systolic (congestive) and diastolic (congestive) heart failure: Secondary | ICD-10-CM | POA: Diagnosis not present

## 2021-11-19 DIAGNOSIS — R197 Diarrhea, unspecified: Secondary | ICD-10-CM | POA: Diagnosis not present

## 2021-11-19 DIAGNOSIS — K921 Melena: Secondary | ICD-10-CM

## 2021-11-19 DIAGNOSIS — R7401 Elevation of levels of liver transaminase levels: Secondary | ICD-10-CM

## 2021-11-19 DIAGNOSIS — E782 Mixed hyperlipidemia: Secondary | ICD-10-CM

## 2021-11-19 LAB — GASTROINTESTINAL PANEL BY PCR, STOOL (REPLACES STOOL CULTURE)

## 2021-11-19 LAB — COMPREHENSIVE METABOLIC PANEL
ALT: 112 U/L — ABNORMAL HIGH (ref 0–44)
AST: 137 U/L — ABNORMAL HIGH (ref 15–41)
Albumin: 3.4 g/dL — ABNORMAL LOW (ref 3.5–5.0)
Alkaline Phosphatase: 171 U/L — ABNORMAL HIGH (ref 38–126)
Anion gap: 6 (ref 5–15)
BUN: 12 mg/dL (ref 8–23)
CO2: 23 mmol/L (ref 22–32)
Calcium: 8.3 mg/dL — ABNORMAL LOW (ref 8.9–10.3)
Chloride: 108 mmol/L (ref 98–111)
Creatinine, Ser: 0.63 mg/dL (ref 0.61–1.24)
GFR, Estimated: >60 mL/min (ref >60)
Glucose, Bld: 101 mg/dL — ABNORMAL HIGH (ref 70–99)
Potassium: 3.7 mmol/L (ref 3.5–5.1)
Sodium: 137 mmol/L (ref 135–145)
Total Bilirubin: 0.8 mg/dL (ref 0.3–1.2)
Total Protein: 5.9 g/dL — ABNORMAL LOW (ref 6.5–8.1)

## 2021-11-19 LAB — MAGNESIUM: Magnesium: 1.8 mg/dL (ref 1.7–2.4)

## 2021-11-19 LAB — CBC
HCT: 36.5 % — ABNORMAL LOW (ref 39.0–52.0)
Hemoglobin: 12.3 g/dL — ABNORMAL LOW (ref 13.0–17.0)
MCH: 30.7 pg (ref 26.0–34.0)
MCHC: 33.7 g/dL (ref 30.0–36.0)
MCV: 91 fL (ref 80.0–100.0)
Platelets: 136 10*3/uL — ABNORMAL LOW (ref 150–400)
RBC: 4.01 MIL/uL — ABNORMAL LOW (ref 4.22–5.81)
RDW: 13.2 % (ref 11.5–15.5)
WBC: 4.6 10*3/uL (ref 4.0–10.5)
nRBC: 0 % (ref 0.0–0.2)

## 2021-11-19 LAB — LIPASE, BLOOD: Lipase: 25 U/L (ref 11–51)

## 2021-11-19 LAB — PHOSPHORUS: Phosphorus: 3 mg/dL (ref 2.5–4.6)

## 2021-11-19 LAB — HIV ANTIBODY (ROUTINE TESTING W REFLEX): HIV Screen 4th Generation wRfx: NONREACTIVE

## 2021-11-19 MED ORDER — PEG 3350-KCL-NA BICARB-NACL 420 G PO SOLR: 4000.0000 mL | Freq: Once | ORAL | Status: DC

## 2021-11-19 MED ORDER — CARVEDILOL 3.125 MG PO TABS
6.2500 mg | ORAL_TABLET | Freq: Two times a day (BID) | ORAL | Status: DC
  Administered 2021-11-19 – 2021-11-20 (×4): 6.25 mg via ORAL
  Filled 2021-11-19 (×4): qty 2

## 2021-11-19 MED ORDER — CITALOPRAM HYDROBROMIDE 20 MG PO TABS
20.0000 mg | ORAL_TABLET | Freq: Every day | ORAL | Status: DC
  Administered 2021-11-19 – 2021-11-20 (×2): 20 mg via ORAL
  Filled 2021-11-19 (×2): qty 1

## 2021-11-19 MED ORDER — ACETAMINOPHEN 500 MG PO TABS
1000.0000 mg | ORAL_TABLET | Freq: Four times a day (QID) | ORAL | Status: DC | PRN
  Administered 2021-11-19: 1000 mg via ORAL
  Filled 2021-11-19: qty 2

## 2021-11-19 MED ORDER — BACLOFEN 10 MG PO TABS
10.0000 mg | ORAL_TABLET | Freq: Two times a day (BID) | ORAL | Status: DC
  Administered 2021-11-19 – 2021-11-20 (×2): 10 mg via ORAL
  Filled 2021-11-19 (×2): qty 1

## 2021-11-19 MED ORDER — SACUBITRIL-VALSARTAN 24-26 MG PO TABS
1.0000 | ORAL_TABLET | Freq: Two times a day (BID) | ORAL | Status: DC
  Administered 2021-11-19 – 2021-11-20 (×3): 1 via ORAL
  Filled 2021-11-19 (×3): qty 1

## 2021-11-19 MED ORDER — GABAPENTIN 300 MG PO CAPS
300.0000 mg | ORAL_CAPSULE | Freq: Two times a day (BID) | ORAL | Status: DC
  Administered 2021-11-19 – 2021-11-20 (×4): 300 mg via ORAL
  Filled 2021-11-19 (×4): qty 1

## 2021-11-19 MED ORDER — PRAVASTATIN SODIUM 40 MG PO TABS: 80.0000 mg | ORAL_TABLET | Freq: Every day | ORAL | Status: DC

## 2021-11-19 NOTE — Hospital Course (Signed)
61 year old male with a history of small lymphocytic lymphoma in remission, hypertension, hyperlipidemia, coronary disease, anemia of chronic disease presenting with upper abdominal pain, diarrhea, and hematochezia that started on 11/17/2021 patient stated that he had 5 loose stools on 11/17/2021, and 10 loose stools on 11/16/2021 with intermittent hematochezia.  His upper abdominal pain is primarily epigastric but has been constant.  He denies any worsening postprandial pain.  He has had some subjective fevers and chills.  He denies any chest pain, shortness of breath, coughing, hemoptysis, dysuria, hematuria.  There is no melanotic stool.  He denies any recent antibiotics.  He denies any any NSAIDs.  He has not had any travels.  He has not been any raw or undercooked foods.  He did have 1 episode of nausea and vomiting in the emergency department without blood. ?He was due to have a colonoscopy by Dr. Gala Romney in Jan 2023, but he canceled it.  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 ?In the ED, the patient had a low-grade temperature 99.6 ?F.  He was hemodynamically stable with oxygen saturation 100% room air.  WBC 4.6, hemoglobin 12.8, platelets 150,000.Sodium 136, potassium 3.5, bicarbonate 22, serum creatinine 0.64.  CT of the abdomen and pelvis showed prior cholecystectomy without biliary ductal dilatation.  There is no bowel wall thickening or dilatation.  There is tiny umbilical hernia with some fat stranding.  The patient was started on a Protonix drip and GI was consulted to assist with management. ?

## 2021-11-19 NOTE — Assessment & Plan Note (Addendum)
Suspect hemodynamic changes ?Holding statin ?RUQ US--neg ?Check lipase--25 ?-overall improved ?

## 2021-11-19 NOTE — Assessment & Plan Note (Signed)
Stool pathogen panel ?No recent history of antibiotics ?

## 2021-11-19 NOTE — Assessment & Plan Note (Signed)
Patient has a history of erosive esophagitis on EGD 11/26/2018 ?Continue pantoprazole ?

## 2021-11-19 NOTE — Assessment & Plan Note (Addendum)
Holding statin temporarily in the setting of elevated LFTs ?--restart as LFTs have improved ?

## 2021-11-19 NOTE — Assessment & Plan Note (Signed)
No chest pain presently ?Continue carvedilol ?Holding aspirin in the setting of hematochezia ?

## 2021-11-19 NOTE — Assessment & Plan Note (Signed)
Clinically euvolemic ?01/14/2020 echo EF 50-55%, mild global HK, grade 1 DD ?Continue Entresto ?Continue carvedilol ?

## 2021-11-19 NOTE — Assessment & Plan Note (Signed)
Patient follows Dr. Delton Coombes ?Currently in remission ?

## 2021-11-19 NOTE — Assessment & Plan Note (Addendum)
GI consult appreciated>>defer colonoscopy for now due to salmonella ?11/18/2021 CT abdomen--as discussed above ?Clear liquid diet>>advanced to soft diet which patient tolerated ?Holding aspirin and effient>>restart after d/c ?Hgb stable ?Will need colonoscopy as outpatient in 6-8 weeks ?

## 2021-11-19 NOTE — Assessment & Plan Note (Signed)
Continue carvedilol 

## 2021-11-19 NOTE — Progress Notes (Signed)
?  ?       ?PROGRESS NOTE ? ?Patrick Rubio NKN:[id number] DOB: [birth year] DOA: 11/18/2021 ?PCP: Orpah Greek, MD ? ?Brief History:  ?61 year old male with a history of small lymphocytic lymphoma in remission, hypertension, hyperlipidemia, coronary disease, anemia of chronic disease presenting with upper abdominal pain, diarrhea, and hematochezia that started on 11/17/2021 patient stated that he had 5 loose stools on 11/17/2021, and 10 loose stools on 11/16/2021 with intermittent hematochezia.  His upper abdominal pain is primarily epigastric but has been constant.  He denies any worsening postprandial pain.  He has had some subjective fevers and chills.  He denies any chest pain, shortness of breath, coughing, hemoptysis, dysuria, hematuria.  There is no melanotic stool.  He denies any recent antibiotics.  He denies any any NSAIDs.  He has not had any travels.  He has not been any raw or undercooked foods.  He did have 1 episode of nausea and vomiting in the emergency department without blood. ?He was due to have a colonoscopy by Dr. Gala Romney in Jan 2023, but he canceled it.  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 ?In the ED, the patient had a low-grade temperature 99.6 ?F.  He was hemodynamically stable with oxygen saturation 100% room air.  WBC 4.6, hemoglobin 12.8, platelets 150,000.Sodium 136, potassium 3.5, bicarbonate 22, serum creatinine 0.64.  CT of the abdomen and pelvis showed prior cholecystectomy without biliary ductal dilatation.  There is no bowel wall thickening or dilatation.  There is tiny umbilical hernia with some fat stranding.  The patient was started on a Protonix drip and GI was consulted to assist with management.  ? ? ?Assessment and Plan: ?* Hematochezia ?Suspect possible ischemic colitis ?GI consulted ?11/18/2021 CT abdomen--as discussed above ?Clear liquid diet for now ?Holding aspirin ?Patient remains hemodynamically stable ? ?Diarrhea ?Stool pathogen panel ?No recent history of  antibiotics ? ?Transaminasemia ?Suspect hemodynamic changes ?Holding statin ?RUQ [place] ?Check lipase ? ?Mixed hyperlipidemia ?Holding statin temporarily in the setting of elevated LFTs ? ?Chronic combined systolic and diastolic CHF (congestive heart failure) ([place]) ?Clinically euvolemic ?01/14/2020 echo EF 50-55%, mild global HK, grade 1 DD ?Continue Entresto ?Continue carvedilol ? ?Essential hypertension ?Continue carvedilol ? ?CAD (coronary artery disease) ?No chest pain presently ?Continue carvedilol ?Holding aspirin in the setting of hematochezia ? ?GERD (gastroesophageal reflux disease) ?Patient has a history of erosive esophagitis on EGD 11/26/2018 ?Continue pantoprazole ? ?Small lymphocytic lymphoma ([place]) ?Patient follows Dr. Delton Coombes ?Currently in remission ? ? ? ?Family Communication:   no Family at bedside ? ?Consultants:  GI ? ?Code Status:  FULL  ? ?DVT Prophylaxis:  SCDs ? ? ?Procedures: ?As Listed in Progress Note Above ? ?Antibiotics: ?None ? ? ? ? ? ? ? ?Subjective: ?Patient had 3 loose stools overnight.  He still has some hematochezia.  Abdominal pain is controlled.  He denies any chest pain, shortness of breath, coughing, hemoptysis.  There is no dysuria or hematuria. ? ?Objective: ?Vitals:  ? 11/18/21 2145 11/18/21 2218 11/19/21 0230 11/19/21 0629  ?BP: 129/71 (!) 143/78 126/87 134/77  ?Pulse: 77 (!) 57 64 60  ?Resp: (!) '23 18 16 16  '$ ?Temp: 99.1 ?F (37.3 ?C) 98.2 ?F (36.8 ?C) 98.3 ?F (36.8 ?C)   ?TempSrc: Oral Oral Oral   ?SpO2:  99% 100% 98%  ?Weight:  83.3 kg    ?Height:  '5\' 10"'$  (1.778 m)    ? ? ?Intake/Output Summary (Last 24 hours) at 11/19/2021 0727 ?Last data filed at 11/19/2021  0603 ?Gross per 24 hour  ?Intake 1645.12 ml  ?Output --  ?Net 1645.12 ml  ? ?Weight change:  ?Exam: ? ?General:  Pt is alert, follows commands appropriately, not in acute distress ?HEENT: No icterus, No thrush, No neck mass, [place]/AT ?Cardiovascular: RRR, S1/S2, no rubs, no gallops ?Respiratory: CTA bilaterally, no wheezing,  no crackles, no rhonchi ?Abdomen: Soft/+BS, epigastric tender, non distended, no guarding ?Extremities: No edema, No lymphangitis, No petechiae, No rashes, no synovitis ? ? ?Data Reviewed: ?I have personally reviewed following labs and imaging studies ?Basic Metabolic Panel: ?Recent Labs  ?Lab 11/15/21 ?0943 11/18/21 ?1754 11/19/21 ?0518  ?NA 138 136 137  ?K 3.6 3.5 3.7  ?CL 106 108 108  ?CO2 '24 22 23  '$ ?GLUCOSE 132* 95 101*  ?BUN '15 14 12  '$ ?CREATININE 0.75 0.64 0.63  ?CALCIUM 8.9 8.5* 8.3*  ?MG  --  2.0 1.8  ?PHOS  --   --  3.0  ? ?Liver Function Tests: ?Recent Labs  ?Lab 11/15/21 ?0943 11/18/21 ?1754 11/19/21 ?0518  ?AST 18 13* 137*  ?ALT 11 11 112*  ?ALKPHOS 51 54 171*  ?BILITOT 1.0 0.4 0.8  ?PROT 6.4* 6.2* 5.9*  ?ALBUMIN 3.8 3.7 3.4*  ? ?No results for input(s): LIPASE, AMYLASE in the last 168 hours. ?No results for input(s): AMMONIA in the last 168 hours. ?Coagulation Profile: ?Recent Labs  ?Lab 11/18/21 ?1754  ?INR 1.1  ? ?CBC: ?Recent Labs  ?Lab 11/15/21 ?0943 11/18/21 ?1754 11/19/21 ?0518  ?WBC 6.1 5.1 4.6  ?NEUTROABS 4.3  --   --   ?HGB 13.5 12.8* 12.3*  ?HCT 39.6 38.0* 36.5*  ?MCV 91.9 89.6 91.0  ?PLT 165 150 136*  ? ?Cardiac Enzymes: ?No results for input(s): CKTOTAL, CKMB, CKMBINDEX, TROPONINI in the last 168 hours. ?BNP: ?Invalid input(s): POCBNP ?CBG: ?No results for input(s): GLUCAP in the last 168 hours. ?HbA1C: ?No results for input(s): HGBA1C in the last 72 hours. ?Urine analysis: ?No results found for: COLORURINE, APPEARANCEUR, [place], [place], [place], [place], [place], KETONESUR, PROTEINUR, [place], NITRITE, LEUKOCYTESUR ?Sepsis Labs: ?'@LABRCNTIP'$ (procalcitonin:4,lacticidven:4) ?)No results found for this or any previous visit (from the past 240 hour(s)).  ? ?Scheduled Meds: ? carvedilol  6.25 mg Oral BID  ? gabapentin  300 mg Oral BID  ? [START ON 11/22/2021] pantoprazole  40 mg Intravenous Q12H  ? sacubitril-valsartan  1 tablet Oral BID  ? ?Continuous Infusions: ? pantoprazole 8 mg/hr  (11/19/21 0603)  ? ? ?Procedures/Studies: ?CT Abdomen Pelvis W Contrast ? ?Result Date: 11/18/2021 ?CLINICAL DATA:  Abdominal pain, acute, nonlocalized EXAM: CT ABDOMEN AND PELVIS WITH CONTRAST TECHNIQUE: Multidetector CT imaging of the abdomen and pelvis was performed using the standard protocol following bolus administration of intravenous contrast. RADIATION DOSE REDUCTION: This exam was performed according to the departmental dose-optimization program which includes automated exposure control, adjustment of the mA and/or kV according to patient size and/or use of iterative reconstruction technique. CONTRAST:  127m OMNIPAQUE IOHEXOL 300 MG/ML  SOLN COMPARISON:  None. FINDINGS: Lower chest: No acute abnormality. Hepatobiliary: No focal liver abnormality. Status post cholecystectomy. No biliary dilatation . Pancreas: No focal lesion. Normal pancreatic contour. No surrounding inflammatory changes. No main pancreatic ductal dilatation. Spleen: Normal in size without focal abnormality. Adrenals/Urinary Tract: No adrenal nodule bilaterally. Slightly malrotated right kidney. Bilateral kidneys enhance symmetrically. Subcentimeter hypodensities are too small to characterize. Plain this lesion within left kidney likely represents a simple renal cyst. No hydronephrosis. No hydroureter. The urinary bladder is unremarkable. On delayed imaging, there is no urothelial wall  thickening and there are no filling defects in the opacified portions of the bilateral collecting systems or ureters. Stomach/Bowel: Stomach is within normal limits. No evidence of bowel wall thickening or dilatation. Appendix appears normal. Vascular/Lymphatic: No abdominal aorta or iliac aneurysm. Mild atherosclerotic plaque of the aorta and its branches. No abdominal, pelvic, or inguinal lymphadenopathy. Reproductive: Prostate is unremarkable. Other: No intraperitoneal free fluid. No intraperitoneal free gas. No organized fluid collection. Musculoskeletal:  Tiny fat containing umbilical hernia. Associated trace fat stranding. No suspicious lytic or blastic osseous lesions. No acute displaced fracture. L3-S1 posterolateral fusion. IMPRESSION: 1. Tiny fat contai

## 2021-11-19 NOTE — Consult Note (Signed)
Referring Provider: Orson Eva, DO ?Primary Care Physician:  Orpah Greek, MD ?Primary Gastroenterologist:  Dr. Laural Golden ? ?Reason for Consultation:   ? ?Bloody diarrhea. ? ?HPI:  ? ?Patient is 61 year old Caucasian male who presented to emergency room yesterday with acute on chronic diarrhea and rectal bleeding and was therefore hospitalized for further evaluation. ?He has history of small lymphocytic lymphoma diagnosed in October 2020 and he received chemotherapy and has remained in remission.  He is followed by Dr. Delton Coombes. ? ?Patient states his diarrhea started 9 or 10 months ago.  Diarrhea started sometime after cholecystectomy.  He also would pass bright red blood intermittently.  He never had more than 2-3 stools per day.  He was seen by Dr. Gala Romney in December 2022.  GI pathogen panel was negative.  He never provided a stool specimen to the lab.  He was scheduled for colonoscopy on 08/24/2021.  He was advised to pay $3500 upfront for colonoscopy and he therefore canceled his colonoscopy.  He says he has been doing well until 2 days ago when he developed abdominal pain nausea and worsening diarrhea.  He had 7 or 8 stools 2 days ago and passed moderate amount of blood with each bowel movement.  It was bright red.  He had 7 or 8 stools yesterday as well.  He also noted that he had lost 7 pounds recently.  He therefore decided to come to the emergency room.  He was evaluated by Dr. Aletta Edouard. ?Work-up revealed WBC of 6.1 H&H of 13.5 and 39.6.  MCV was 91.9 and platelet count was 165 K.  Comprehensive chemistry panel was normal except glucose of 132.  Serum magnesium was 2.  INR was 1.1.  Abdominal pelvic CT was obtained with contrast and reveals no abnormality to small or large bowel.  Small fat-containing umbilical hernia was noted some stranding.  Repeat hemoglobin was down to 12.8.  Given that patient was taking prasugrel it was decided to bring him in the hospital for further evaluation. ?He is continuing  to pass loose watery bloody stool.  His H&H this morning was 12.3 and 36.5.  His platelet count is 136 K. ?Patient complains of generalized abdominal pain which she describes to be mild.  His appetite is not good.  He has not experienced any fever or chills.  He has not taken any antibiotics recently.  He is on low-dose aspirin.  He feels heartburn is well controlled with therapy.  He denies chest pain shortness of breath or skin rash.  He also denies vomiting. ?Dr. Carles Collet obtained right upper quadrant abdominal ultrasound because of pain and this study is negative.  Bile duct measured 4.7 mm.  Gallbladder has been removed.  No hepatic abnormality. ?Patient has never undergone colonoscopy in the past. ?Family history is negative for inflammatory bowel disease or colon cancer. ? ?Patient is divorced.  He has 2 daughters in good health.  All in all he has worked for 42 years.  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  He has been smoking cigarettes for about 25 years.  He states he quit smoking for 14 years but after his divorce he decided to go back to smoking.  He smokes a pack a day.  He does not drink alcohol.  But he is very active he does not do any regular physical activity. ? ?Both parents are diseased.  Mother had dementia and unknown  malignancy and died at 6.  Father died of MI in his 65s.  He has 1 sister in good health. ? ? ?Past Medical History:  ?Diagnosis Date  ? CAD (coronary artery disease)   ? GERD (gastroesophageal reflux disease)   ? HTN (hypertension)   ? Hypercholesterolemia   ? MI (myocardial infarction) ([place]) 2003  ? Small lymphocytic lymphoma ([place])   ?    ? ? ?Past Surgical History:  ?Procedure Laterality Date  ? AXILLARY LYMPH NODE BIOPSY Left 04/27/2019  ? Procedure: AXILLARY LYMPH NODE BIOPSY;  Surgeon: Aviva Signs, MD;  Location: [facility];  Service: General;  Laterality: Left;  ? BIOPSY  11/26/2018  ? Procedure: BIOPSY;   Surgeon: Daneil Dolin, MD;  Location: [facility] SUITE;  Service: Endoscopy;;  ? CHOLECYSTECTOMY N/A 05/18/2019  ? Procedure: LAPAROSCOPIC CHOLECYSTECTOMY;  Surgeon: Aviva Signs, MD;  Location: [facility];  Service: General;  Laterality: N/A;  ? CORONARY ANGIOPLASTY WITH STENT PLACEMENT    ? 2003  ? ESOPHAGOGASTRODUODENOSCOPY N/A 11/26/2018  ? mild erosive reflux esophagitis, gastric erythema. Negative H.pylori   ? INGUINAL HERNIA REPAIR Right 04/27/2019  ? Procedure: HERNIA REPAIR INGUINAL ADULT;  Surgeon: Aviva Signs, MD;  Location: [facility];  Service: General;  Laterality: Right;  ? KNEE SURGERY Right   ? lumbar fusion with cage  07/2017  ? NASAL SINUS SURGERY    ? TONSILLECTOMY AND ADENOIDECTOMY    ? ? ?Prior to Admission medications   ?Medication Sig Start Date End Date Taking? Authorizing Provider  ?acetaminophen (TYLENOL) 650 MG CR tablet Take 650 mg by mouth in the morning and at bedtime.   Yes [provider]  ?baclofen (LIORESAL) 10 MG tablet Take 10 mg by mouth 2 (two) times daily. 07/09/21  Yes [provider]  ?carvedilol (COREG) 6.25 MG tablet Take 6.25 mg by mouth 2 (two) times daily. 05/25/20  Yes [provider]  ?cholecalciferol (VITAMIN D3) 25 MCG (1000 UNIT) tablet Take 1,000 Units by mouth in the morning and at bedtime.   Yes [provider]  ?escitalopram (LEXAPRO) 20 MG tablet Take 20 mg by mouth daily.  10/06/18  Yes [provider]  ?gabapentin (NEURONTIN) 300 MG capsule Take 300 mg by mouth 2 (two) times daily. 06/28/21  Yes [provider]  ?nitroGLYCERIN (NITROSTAT) 0.4 MG SL tablet Place 0.4 mg under the tongue every 5 (five) minutes as needed for chest pain.   Yes [provider]  ?pantoprazole (PROTONIX) 40 MG tablet TAKE 1 TABLET BY MOUTH EVERY DAY 08/30/21  Yes [name], NP  ?Pitavastatin Calcium (LIVALO) 4 MG TABS Take 4 mg by mouth every evening.   Yes [provider]  ?prasugrel (EFFIENT) 10 MG TABS tablet Take  10 mg by mouth daily. 10/02/19  Yes [provider]  ?sacubitril-valsartan (ENTRESTO) 24-26 MG Take 1 tablet by mouth 2 (two) times daily.   Yes [provider]  ?aspirin EC 81 MG tablet Take 81 mg by mouth every Monday, Wednesday, and Friday.    [provider]  ?polyethylene glycol-electrolytes (NULYTELY) 420 g solution As directed ?Patient not taking: Reported on 11/18/2021 08/10/21   Daneil Dolin, MD  ? ? ?Current Facility-Administered Medications  ?Medication Dose Route Frequency Provider Last Rate Last Admin  ? carvedilol (COREG) tablet 6.25 mg  6.25 mg Oral BID Adefeso, Oladapo, DO   6.25 mg at 11/19/21 3382  ? gabapentin (NEURONTIN) capsule 300 mg  300 mg Oral BID Adefeso, Oladapo, DO  300 mg at 11/19/21 0816  ? HYDROmorphone (DILAUDID) injection 0.5 mg  0.5 mg Intravenous Q4H PRN Adefeso, Oladapo, DO      ? [START ON 11/22/2021] pantoprazole (PROTONIX) injection 40 mg  40 mg Intravenous Q12H Adefeso, Oladapo, DO      ? pantoprozole (PROTONIX) 80 mg /NS 100 mL infusion  8 mg/hr Intravenous Continuous Adefeso, Oladapo, DO 10 mL/hr at 11/19/21 0603 8 mg/hr at 11/19/21 0603  ? sacubitril-valsartan (ENTRESTO) 24-26 mg per tablet  1 tablet Oral BID Adefeso, Oladapo, DO   1 tablet at 11/19/21 0816  ? ? ?Allergies as of 11/18/2021  ? (No Known Allergies)  ? ? ?Family History  ?Problem Relation Age of Onset  ? Colon polyps Father   ?     thinks he may have had polyps, possibly in his 60s.   ? Heart attack Father   ? Dementia Mother   ? Hypotension Mother   ? Stroke Sister   ? Diabetes Maternal Grandmother   ? Stroke Maternal Grandfather   ? Cancer Paternal Grandmother   ? Congestive Heart Failure Paternal Grandfather   ? Kidney cancer Daughter   ? Colon cancer Neg Hx   ? Pancreatic cancer Neg Hx   ? Liver disease Neg Hx   ? ? ?Social History  ? ?Socioeconomic History  ? Marital status: Divorced  ?  Spouse name: Not on file  ? Number of children: 2  ? Years of education: Not on file  ?  Highest education level: Not on file  ?Occupational History  ? Occupation: employed  ?  Comment: part time at [employer] home  ?Tobacco Use  ? Smoking status: Every Day  ?  Packs/day: 0.50  ?  Years: 25.00  ?  Pack

## 2021-11-20 ENCOUNTER — Encounter: Payer: Self-pay | Admitting: Internal Medicine

## 2021-11-20 ENCOUNTER — Telehealth: Payer: Self-pay | Admitting: Gastroenterology

## 2021-11-20 DIAGNOSIS — K625 Hemorrhage of anus and rectum: Principal | ICD-10-CM

## 2021-11-20 DIAGNOSIS — C8300 Small cell B-cell lymphoma, unspecified site: Secondary | ICD-10-CM | POA: Diagnosis not present

## 2021-11-20 DIAGNOSIS — A020 Salmonella enteritis: Secondary | ICD-10-CM | POA: Diagnosis not present

## 2021-11-20 DIAGNOSIS — I5042 Chronic combined systolic (congestive) and diastolic (congestive) heart failure: Secondary | ICD-10-CM | POA: Diagnosis not present

## 2021-11-20 DIAGNOSIS — I10 Essential (primary) hypertension: Secondary | ICD-10-CM | POA: Diagnosis not present

## 2021-11-20 DIAGNOSIS — R7401 Elevation of levels of liver transaminase levels: Secondary | ICD-10-CM | POA: Diagnosis not present

## 2021-11-20 DIAGNOSIS — R197 Diarrhea, unspecified: Secondary | ICD-10-CM | POA: Diagnosis not present

## 2021-11-20 DIAGNOSIS — K921 Melena: Secondary | ICD-10-CM | POA: Diagnosis not present

## 2021-11-20 LAB — COMPREHENSIVE METABOLIC PANEL
ALT: 70 U/L — ABNORMAL HIGH (ref 0–44)
AST: 41 U/L (ref 15–41)
Albumin: 3.5 g/dL (ref 3.5–5.0)
Alkaline Phosphatase: 139 U/L — ABNORMAL HIGH (ref 38–126)
Anion gap: 8 (ref 5–15)
BUN: 11 mg/dL (ref 8–23)
CO2: 24 mmol/L (ref 22–32)
Calcium: 8.5 mg/dL — ABNORMAL LOW (ref 8.9–10.3)
Chloride: 105 mmol/L (ref 98–111)
Creatinine, Ser: 0.72 mg/dL (ref 0.61–1.24)
GFR, Estimated: >60 mL/min (ref >60)
Glucose, Bld: 89 mg/dL (ref 70–99)
Potassium: 3.4 mmol/L — ABNORMAL LOW (ref 3.5–5.1)
Sodium: 137 mmol/L (ref 135–145)
Total Bilirubin: 0.9 mg/dL (ref 0.3–1.2)
Total Protein: 6.4 g/dL — ABNORMAL LOW (ref 6.5–8.1)

## 2021-11-20 LAB — CBC
HCT: 40.1 % (ref 39.0–52.0)
Hemoglobin: 13.5 g/dL (ref 13.0–17.0)
MCH: 30.2 pg (ref 26.0–34.0)
MCHC: 33.7 g/dL (ref 30.0–36.0)
MCV: 89.7 fL (ref 80.0–100.0)
Platelets: 134 10*3/uL — ABNORMAL LOW (ref 150–400)
RBC: 4.47 MIL/uL (ref 4.22–5.81)
RDW: 13.2 % (ref 11.5–15.5)
WBC: 3.7 10*3/uL — ABNORMAL LOW (ref 4.0–10.5)
nRBC: 0 % (ref 0.0–0.2)

## 2021-11-20 LAB — FERRITIN: Ferritin: 53 ng/mL (ref 24–336)

## 2021-11-20 LAB — IRON AND TIBC
Iron: 23 ug/dL — ABNORMAL LOW (ref 45–182)
Saturation Ratios: 7 % — ABNORMAL LOW (ref 17.9–39.5)
TIBC: 310 ug/dL (ref 250–450)
UIBC: 287 ug/dL

## 2021-11-20 SURGERY — COLONOSCOPY WITH PROPOFOL
Anesthesia: Monitor Anesthesia Care

## 2021-11-20 MED ORDER — POTASSIUM CHLORIDE CRYS ER 20 MEQ PO TBCR
20.0000 meq | EXTENDED_RELEASE_TABLET | Freq: Once | ORAL | Status: AC
  Administered 2021-11-20: 20 meq via ORAL
  Filled 2021-11-20: qty 1

## 2021-11-20 NOTE — TOC Progression Note (Signed)
Transition of Care (TOC) - Progression Note  ? ? ?Patient Details  ?Name: Coulson Wehner ?MRN: [id number] ?Date of Birth: 17-May-1961 ? ?Transition of Care (TOC) CM/SW Contact  ?Salome Arnt, LCSW ?Phone Number: ?11/20/2021, 9:53 AM ? ?Clinical Narrative:   ?Transition of Care (TOC) Screening Note ? ? ?Patient Details  ?Name: Declan Mier ?Date of Birth: [birth year] ? ? ?Transition of Care (TOC) CM/SW Contact:    ?Salome Arnt, LCSW ?Phone Number: ?11/20/2021, 9:53 AM ? ? ? ?Transition of Care Department [facility]) has reviewed patient and no TOC needs have been identified at this time. We will continue to monitor patient advancement through interdisciplinary progression rounds. If new patient transition needs arise, please place a TOC consult. ?   ? ? ? ?  ?  ? ?Expected Discharge Plan and Services ?  ?  ?  ?  ?  ?                ?  ?  ?  ?  ?  ?  ?  ?  ?  ?  ? ? ?Social Determinants of Health (SDOH) Interventions ?  ? ?Readmission Risk Interventions ?   ? View : No data to display.  ?  ?  ?  ? ? ?

## 2021-11-20 NOTE — Progress Notes (Signed)
? ?Gastroenterology Progress Note  ? ?Referring Provider: No ref. provider found ?Primary Care Physician:  Orpah Greek, MD ?Primary Gastroenterologist:  Dr. Gala Romney  ? ?Patient ID: Patrick Rubio; [id number]; 12-22-1960  ? ? ?Subjective  ? ?No abdominal pain. No N/V. Small amount of stool this morning, improving. Small amount of pink-tinged blood. Sitting up in chair today.  ? ? ?Objective  ? ?Vital signs in last 24 hours ?Temp:  [98.5 ?F (36.9 ?C)] 98.5 ?F (36.9 ?C) (04/23 2056) ?Pulse Rate:  [56] 56 (04/23 2056) ?Resp:  [20] 20 (04/23 2056) ?BP: (141)/(77) 141/77 (04/23 2056) ?SpO2:  [97 %] 97 % (04/23 2056) ?Last BM Date : 11/20/21 ? ?Physical Exam ?General:   Alert and oriented, pleasant, sitting up in chair.  ?Head:  Normocephalic and atraumatic. ?Abdomen:  Bowel sounds present, soft, non-tender, non-distended.  ?Extremities:  Without  edema. ?Neurologic:  Alert and  oriented x4 ?Psych:  Alert and cooperative. Normal mood and affect. ? ?Intake/Output from previous day: ?04/23 0701 - 04/24 0700 ?In: 109 [I.V.:77] ?Out: -  ?Intake/Output this shift: ?Total I/O ?In: 480 [P.O.:480] ?Out: -  ? ?Lab Results ? ?Recent Labs  ?  11/18/21 ?1754 11/19/21 ?9924 11/20/21 ?0436  ?WBC 5.1 4.6 3.7*  ?HGB 12.8* 12.3* 13.5  ?HCT 38.0* 36.5* 40.1  ?PLT 150 136* 134*  ? ?BMET ?Recent Labs  ?  11/18/21 ?1754 11/19/21 ?2683 11/20/21 ?0436  ?NA 136 137 137  ?K 3.5 3.7 3.4*  ?CL 108 108 105  ?CO2 _0 ?GLUCOSE 95 101* 89  ?BUN _1 ?CREATININE 0.64 0.63 0.72  ?CALCIUM 8.5* 8.3* 8.5*  ? ?LFT ?Recent Labs  ?  11/18/21 ?1754 11/19/21 ?4196 11/20/21 ?0436  ?PROT 6.2* 5.9* 6.4*  ?ALBUMIN 3.7 3.4* 3.5  ?AST 13* 137* 41  ?ALT 11 112* 70*  ?ALKPHOS 54 171* 139*  ?BILITOT 0.4 0.8 0.9  ? ?PT/INR ?Recent Labs  ?  11/18/21 ?1754  ?LABPROT 13.8  ?INR 1.1  ? ? ?Studies/Results ?CT Abdomen Pelvis W Contrast ? ?Result Date: 11/18/2021 ?CLINICAL DATA:  Abdominal pain, acute, nonlocalized EXAM: CT ABDOMEN AND PELVIS WITH CONTRAST TECHNIQUE:  Multidetector CT imaging of the abdomen and pelvis was performed using the standard protocol following bolus administration of intravenous contrast. RADIATION DOSE REDUCTION: This exam was performed according to the departmental dose-optimization program which includes automated exposure control, adjustment of the mA and/or kV according to patient size and/or use of iterative reconstruction technique. CONTRAST:  155m OMNIPAQUE IOHEXOL 300 MG/ML  SOLN COMPARISON:  None. FINDINGS: Lower chest: No acute abnormality. Hepatobiliary: No focal liver abnormality. Status post cholecystectomy. No biliary dilatation . Pancreas: No focal lesion. Normal pancreatic contour. No surrounding inflammatory changes. No main pancreatic ductal dilatation. Spleen: Normal in size without focal abnormality. Adrenals/Urinary Tract: No adrenal nodule bilaterally. Slightly malrotated right kidney. Bilateral kidneys enhance symmetrically. Subcentimeter hypodensities are too small to characterize. Plain this lesion within left kidney likely represents a simple renal cyst. No hydronephrosis. No hydroureter. The urinary bladder is unremarkable. On delayed imaging, there is no urothelial wall thickening and there are no filling defects in the opacified portions of the bilateral collecting systems or ureters. Stomach/Bowel: Stomach is within normal limits. No evidence of bowel wall thickening or dilatation. Appendix appears normal. Vascular/Lymphatic: No abdominal aorta or iliac aneurysm. Mild atherosclerotic plaque of the aorta and its branches. No abdominal, pelvic, or inguinal lymphadenopathy. Reproductive: Prostate is unremarkable. Other: No intraperitoneal free fluid. No intraperitoneal free gas. No organized fluid  collection. Musculoskeletal: Tiny fat containing umbilical hernia. Associated trace fat stranding. No suspicious lytic or blastic osseous lesions. No acute displaced fracture. L3-S1 posterolateral fusion. IMPRESSION: 1. Tiny fat  containing umbilical hernia. Associated trace right stranding-correlate clinically for incarceration. No associated bowel obstruction. 2. Otherwise no acute intra-abdominal or intrapelvic abnormality. Electronically Signed   By: Iven Finn M.D.   On: 11/18/2021 20:41  ? ?US Abdomen Limited RUQ (LIVER/GB) ? ?Result Date: 11/19/2021 ?CLINICAL DATA:  Rectal bleed for 1 day. EXAM: ULTRASOUND ABDOMEN LIMITED RIGHT UPPER QUADRANT COMPARISON:  None. FINDINGS: Gallbladder: Prior cholecystectomy. Common bile duct: Diameter: 4.7 mm Liver: No focal lesion identified. Within normal limits in parenchymal echogenicity. Portal vein is patent on color Doppler imaging with normal direction of blood flow towards the liver. Other: None. IMPRESSION: Prior cholecystectomy.  No acute abnormality. Electronically Signed   By: Abelardo Diesel M.D.   On: 11/19/2021 10:50   ? ?Assessment  ?61 y.o. male with a history of chronic, intermittently blood diarrhea, presenting with acute on chronic diarrhea and GI pathogen panel revealed Salmonella. Cdiff not collected.  ? ?Acute on chronic diarrhea: clinically improved. Scant amount of pinkish-blood this morning, much improved. Notably, he does have a history of lymphocytic lymphoma with last chemo in 2021. Will discuss if antibiotics indicated, as he is improving. He does have several risk factors for severe disease. Will need outpatient colonoscopy once over acute illness.  ? ?Normocytic anemia: Hgb drifted during hospitalization. Today improved to 13.5. Ferritin 53, iron low at 23. Will need colonoscopy as outpatient as previously recommended. EGD is on file from 2020 with erosive esophagitis.  ? ?Elevated LFTS: previously normal. AST 137 and ALT 112, Alk Phos 171 yesterday. This morning ALT improved to 70, AST normal, and Alk phos improved to 139. [place] without acute abnormalities. Likely LFTs bumped in setting of acute infection. Improving now. Can follow to baseline.  ? ?Plan / Recommendations   ?Advance to soft diet ?Continue supportive measures ?Will need outpatient colonoscopy ?Anticipate discharge soon if continues to do well ? ? ? LOS: 2 days  ? ? 11/20/2021, 11:35 AM ? ?[name], PhD, ANP-BC ?[facility]  ? ? ? ?

## 2021-11-20 NOTE — Telephone Encounter (Signed)
Patrick Rubio, please arrange hospital follow-up in about 2-4 weeks. Will arrange colonoscopy at that time.  ?

## 2021-11-20 NOTE — Discharge Summary (Signed)
?Physician Discharge Summary ?  ?Patient: Patrick Rubio MRN: [id number] DOB: [birth year]  ?Admit date:     11/18/2021  ?Discharge date: 11/20/21  ?Discharge Physician: Basir Gerrit Rafalski  ? ?PCP: Orpah Greek, MD  ? ?Recommendations at discharge:  ? Please follow up with primary care provider within 1-2 weeks ? Please repeat BMP and CBC in one week ? ? ? ? ?Hospital Course: ?61 year old male with a history of small lymphocytic lymphoma in remission, hypertension, hyperlipidemia, coronary disease, anemia of chronic disease presenting with upper abdominal pain, diarrhea, and hematochezia that started on 11/17/2021 patient stated that he had 5 loose stools on 11/17/2021, and 10 loose stools on 11/16/2021 with intermittent hematochezia.  His upper abdominal pain is primarily epigastric but has been constant.  He denies any worsening postprandial pain.  He has had some subjective fevers and chills.  He denies any chest pain, shortness of breath, coughing, hemoptysis, dysuria, hematuria.  There is no melanotic stool.  He denies any recent antibiotics.  He denies any any NSAIDs.  He has not had any travels.  He has not been any raw or undercooked foods.  He did have 1 episode of nausea and vomiting in the emergency department without blood. ?He was due to have a colonoscopy by Dr. Gala Romney in Jan 2023, but he canceled it.  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 ?In the ED, the patient had a low-grade temperature 99.6 ?F.  He was hemodynamically stable with oxygen saturation 100% room air.  WBC 4.6, hemoglobin 12.8, platelets 150,000.Sodium 136, potassium 3.5, bicarbonate 22, serum creatinine 0.64.  CT of the abdomen and pelvis showed prior cholecystectomy without biliary ductal dilatation.  There is no bowel wall thickening or dilatation.  There is tiny umbilical hernia with some fat stranding.  The patient was started on a Protonix drip and GI was consulted to assist with management. ? ?Assessment and Plan: ?* Hematochezia ?GI  consult appreciated>>defer colonoscopy for now due to salmonella ?11/18/2021 CT abdomen--as discussed above ?Clear liquid diet for now ?Holding aspirin and effient>>restart after d/c ?Hgb stable ? ?Salmonella enteritis ?Stool pathogen panel>>salmonella ?Significant improvement in last 24 hours ?--no blood ?--stools firming up ? ?Transaminasemia ?Suspect hemodynamic changes ?Holding statin ?RUQ US--neg ?Check lipase--25 ?-overall improved ? ?Mixed hyperlipidemia ?Holding statin temporarily in the setting of elevated LFTs ?--restart as LFTs have improved ? ?Chronic combined systolic and diastolic CHF (congestive heart failure) ([place]) ?Clinically euvolemic ?01/14/2020 echo EF 50-55%, mild global HK, grade 1 DD ?Continue Entresto ?Continue carvedilol ? ?Essential hypertension ?Continue carvedilol ? ?CAD (coronary artery disease) ?No chest pain presently ?Continue carvedilol ?Holding aspirin in the setting of hematochezia ? ?GERD (gastroesophageal reflux disease) ?Patient has a history of erosive esophagitis on EGD 11/26/2018 ?Continue pantoprazole ? 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 Delton Coombes ?Currently in remission ? ? ? ? ?  ? ? ?Consultants: GI ?Procedures performed: none ?Disposition: Home ?Diet recommendation:  ?Cardiac diet ?DISCHARGE MEDICATION: ?Allergies as of 11/20/2021   ?No Known Allergies ?  ? ?  ?Medication List  ?  ? ?TAKE these medications   ? ?acetaminophen 650 MG CR tablet ?Commonly known as: TYLENOL ?Take 650 mg by mouth in the morning and at bedtime. ?  ?aspirin EC 81 MG tablet ?Take 81 mg by mouth every Monday, Wednesday, and Friday. ?  ?baclofen 10 MG tablet ?Commonly known as: LIORESAL ?Take 10 mg by mouth 2 (two) times daily. ?  ?carvedilol 6.25 MG tablet ?Commonly known as: COREG ?Take 6.25 mg  by mouth 2 (two) times daily. ?  ?cholecalciferol 25 MCG (1000 UNIT) tablet ?Commonly known as: VITAMIN D3 ?Take 1,000 Units by mouth in the morning and at bedtime. ?  ?escitalopram 20 MG  tablet ?Commonly known as: LEXAPRO ?Take 20 mg by mouth daily. ?  ?gabapentin 300 MG capsule ?Commonly known as: NEURONTIN ?Take 300 mg by mouth 2 (two) times daily. ?  ?Livalo 4 MG Tabs ?Generic drug: Pitavastatin Calcium ?Take 4 mg by mouth every evening. ?  ?nitroGLYCERIN 0.4 MG SL tablet ?Commonly known as: NITROSTAT ?Place 0.4 mg under the tongue every 5 (five) minutes as needed for chest pain. ?  ?pantoprazole 40 MG tablet ?Commonly known as: PROTONIX ?TAKE 1 TABLET BY MOUTH EVERY DAY ?  ?polyethylene glycol-electrolytes 420 g solution ?Commonly known as: NuLYTELY ?As directed ?  ?prasugrel 10 MG Tabs tablet ?Commonly known as: EFFIENT ?Take 10 mg by mouth daily. ?  ?sacubitril-valsartan 24-26 MG ?Commonly known as: ENTRESTO ?Take 1 tablet by mouth 2 (two) times daily. ?  ? ?  ? ? ?Discharge Exam: ?Filed Weights  ? 11/18/21 1712 11/18/21 2218  ?Weight: 84.8 kg 83.3 kg  ? ?HEENT:  [place]/AT, No thrush, no icterus ?CV:  RRR, no rub, no S3, no S4 ?Lung:  CTA, no wheeze, no rhonchi ?Abd:  soft/+BS, NT ?Ext:  No edema, no lymphangitis, no synovitis, no rash ? ? ?Condition at discharge: stable ? ?The results of significant diagnostics from this hospitalization (including imaging, microbiology, ancillary and laboratory) are listed below for reference.  ? ?Imaging Studies: ?CT Abdomen Pelvis W Contrast ? ?Result Date: 11/18/2021 ?CLINICAL DATA:  Abdominal pain, acute, nonlocalized EXAM: CT ABDOMEN AND PELVIS WITH CONTRAST TECHNIQUE: Multidetector CT imaging of the abdomen and pelvis was performed using the standard protocol following bolus administration of intravenous contrast. RADIATION DOSE REDUCTION: This exam was performed according to the departmental dose-optimization program which includes automated exposure control, adjustment of the mA and/or kV according to patient size and/or use of iterative reconstruction technique. CONTRAST:  122m OMNIPAQUE IOHEXOL 300 MG/ML  SOLN COMPARISON:  None. FINDINGS: Lower chest:  No acute abnormality. Hepatobiliary: No focal liver abnormality. Status post cholecystectomy. No biliary dilatation . Pancreas: No focal lesion. Normal pancreatic contour. No surrounding inflammatory changes. No main pancreatic ductal dilatation. Spleen: Normal in size without focal abnormality. Adrenals/Urinary Tract: No adrenal nodule bilaterally. Slightly malrotated right kidney. Bilateral kidneys enhance symmetrically. Subcentimeter hypodensities are too small to characterize. Plain this lesion within left kidney likely represents a simple renal cyst. No hydronephrosis. No hydroureter. The urinary bladder is unremarkable. On delayed imaging, there is no urothelial wall thickening and there are no filling defects in the opacified portions of the bilateral collecting systems or ureters. Stomach/Bowel: Stomach is within normal limits. No evidence of bowel wall thickening or dilatation. Appendix appears normal. Vascular/Lymphatic: No abdominal aorta or iliac aneurysm. Mild atherosclerotic plaque of the aorta and its branches. No abdominal, pelvic, or inguinal lymphadenopathy. Reproductive: Prostate is unremarkable. Other: No intraperitoneal free fluid. No intraperitoneal free gas. No organized fluid collection. Musculoskeletal: Tiny fat containing umbilical hernia. Associated trace fat stranding. No suspicious lytic or blastic osseous lesions. No acute displaced fracture. L3-S1 posterolateral fusion. IMPRESSION: 1. Tiny fat containing umbilical hernia. Associated trace right stranding-correlate clinically for incarceration. No associated bowel obstruction. 2. Otherwise no acute intra-abdominal or intrapelvic abnormality. Electronically Signed   By: MIven FinnM.D.   On: 11/18/2021 20:41  ? ?UKoreaAbdomen Limited RUQ (LIVER/GB) ? ?Result Date: 11/19/2021 ?CLINICAL DATA:  Rectal bleed for 1 day. EXAM: ULTRASOUND ABDOMEN LIMITED RIGHT UPPER QUADRANT COMPARISON:  None. FINDINGS: Gallbladder: Prior cholecystectomy.  Common bile duct: Diameter: 4.7 mm Liver: No focal lesion identified. Within normal limits in parenchymal echogenicity. Portal vein is patent on color Doppler imaging with normal direction of blood flow to

## 2021-11-20 NOTE — Progress Notes (Signed)
Patient discharged home with instructions on self care . IV removed from LFA. Patient walked with this Probation officer and wife to Personal vehicle without incident. Patient verbalized understanding of follow up instructions.  ?

## 2021-11-20 NOTE — Assessment & Plan Note (Signed)
Stool pathogen panel>>salmonella ?Significant improvement in last 24 hours ?--no blood ?--stools firming up ?

## 2021-11-20 NOTE — Plan of Care (Signed)

## 2021-11-20 NOTE — Progress Notes (Signed)
Patient's GI panel resulted positive for salmonella species. Dr. Laural Golden paged, awaiting orders to move forward with starting nulytely prep or to hold. ?

## 2022-01-06 ENCOUNTER — Other Ambulatory Visit: Payer: Self-pay | Admitting: Nurse Practitioner

## 2022-01-31 ENCOUNTER — Ambulatory Visit (INDEPENDENT_AMBULATORY_CARE_PROVIDER_SITE_OTHER): Payer: 59 | Admitting: Gastroenterology

## 2022-01-31 ENCOUNTER — Encounter: Payer: Self-pay | Admitting: *Deleted

## 2022-01-31 ENCOUNTER — Telehealth: Payer: Self-pay | Admitting: *Deleted

## 2022-01-31 ENCOUNTER — Encounter: Payer: Self-pay | Admitting: Gastroenterology

## 2022-01-31 VITALS — BP 119/76 | HR 57 | Temp 97.3°F | Ht 70.0 in | Wt 190.0 lb

## 2022-01-31 DIAGNOSIS — K625 Hemorrhage of anus and rectum: Secondary | ICD-10-CM

## 2022-01-31 DIAGNOSIS — R634 Abnormal weight loss: Secondary | ICD-10-CM | POA: Diagnosis not present

## 2022-01-31 DIAGNOSIS — C8300 Small cell B-cell lymphoma, unspecified site: Secondary | ICD-10-CM | POA: Diagnosis not present

## 2022-01-31 DIAGNOSIS — K529 Noninfective gastroenteritis and colitis, unspecified: Secondary | ICD-10-CM | POA: Insufficient documentation

## 2022-01-31 MED ORDER — NA SULFATE-K SULFATE-MG SULF 17.5-3.13-1.6 GM/177ML PO SOLN: 1.0000 | Freq: Once | ORAL | 0 refills | Status: AC

## 2022-01-31 NOTE — Patient Instructions (Signed)
Colonoscopy and upper endoscopy to be scheduled. See separate instructions.

## 2022-01-31 NOTE — Telephone Encounter (Signed)
Per [facility] website "Notification or Prior Authorization is not required for the requested services. Decision ID #:[id number]"

## 2022-01-31 NOTE — Progress Notes (Signed)
GI Office Note     Primary Care Physician:  Vanessa [place], FNP  Primary Gastroenterologist: Roetta Sessions, MD   Chief Complaint   Chief Complaint  Patient presents with   Follow-up    Hospital follow up for rectal bleeding due to food poisoning. Still has some bleeding, last time noticed was this past Friday.     History of Present Illness   Patrick Rubio is a 61 y.o. male presenting today for hospital follow-up of acute on chronic diarrhea, rectal bleeding, hospitalized in April 2023.  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 History cholecystectomy in 2020.  Seen in our office back in December for chronic diarrhea for more than 6 months. He was scheduled for colonoscopy/EGD in January, because of upfront cost (high deductible plan), he canceled his procedure.  He has never had a colonoscopy.  Presented to the hospital in April with acute on chronic diarrhea, rectal bleeding.  Plans for inpatient colonoscopy but his stool studies came back positive for Salmonella.  During hospitalization he was also noted to have mild elevation of LFTs, felt to be related to acute illness.  Presents now to to schedule colonoscopy and upper endoscopy.  Patient states after he was hospitalized for Salmonella, he developed C. difficile requiring vancomycin.  He was treated by his PCP.  Patient states his acute diarrhea did improve but he still having chronic diarrhea, typically 2-3 loose stools per day.  Continues to have some intermittent bright red blood per rectum but this is much better than when he was in the hospital in April.  Continues to have vague abdominal pain.  He has a history of right inguinal hernia which was repaired in 2020.  CT imaging during recent admission with umbilical hernia containing fat.  Denies heartburn, vomiting.  Patient notes that he did lose about 15 pounds in 8 months with his  lowest around 183 pounds back in April.  Patient has gained 7 pounds back.   CT abdomen pelvis with contrast April 2022: Tiny fat-containing umbilical hernia.  Associated trace right stranding correlate clinically for incarceration.  No obstruction.   Right upper quadrant ultrasound April 2023: Prior cholecystectomy.  CBD 4.7 mm.  Medications   Current Outpatient Medications  Medication Sig Dispense Refill   acetaminophen (TYLENOL) 650 MG CR tablet Take 650 mg by mouth in the morning and at bedtime.     aspirin EC 81 MG tablet Take 81 mg by mouth every Monday, Wednesday, and Friday.     baclofen (LIORESAL) 10 MG tablet Take 10 mg by mouth 2 (two) times daily.     carvedilol (COREG) 6.25 MG tablet Take 6.25 mg by mouth 2 (two) times daily.     cholecalciferol (VITAMIN D3) 25 MCG (1000 UNIT) tablet Take 1,000 Units by mouth in the morning and at bedtime.     escitalopram (LEXAPRO) 20 MG tablet Take 20 mg by mouth daily.      gabapentin (NEURONTIN) 300 MG capsule Take 300 mg by mouth 2 (two) times daily.     nitroGLYCERIN (NITROSTAT) 0.4 MG SL tablet Place 0.4 mg under the tongue every 5 (five) minutes as needed for chest pain.     pantoprazole (PROTONIX) 40 MG tablet TAKE 1 TABLET BY MOUTH EVERY DAY 90 tablet 3   Pitavastatin Calcium (LIVALO) 4 MG TABS Take 4 mg by mouth every evening.     prasugrel (  EFFIENT) 10 MG TABS tablet Take 10 mg by mouth daily.     sacubitril-valsartan (ENTRESTO) 24-26 MG Take 1 tablet by mouth 2 (two) times daily.     Na Sulfate-K Sulfate-Mg Sulf 17.5-3.13-1.6 GM/177ML SOLN Take 1 kit by mouth once for 1 dose. 354 mL 0   No current facility-administered medications for this visit.    Allergies   Allergies as of 01/31/2022   (No Known Allergies)     Past Medical History   Past Medical History:  Diagnosis Date   CAD (coronary artery disease)    GERD (gastroesophageal reflux disease)    HTN (hypertension)    Hypercholesterolemia    MI (myocardial  infarction) ([place]) 2003   Small lymphocytic lymphoma ([place])    Stomach cancer [facility])     Past Surgical History   Past Surgical History:  Procedure Laterality Date   AXILLARY LYMPH NODE BIOPSY Left 04/27/2019   Procedure: AXILLARY LYMPH NODE BIOPSY;  Surgeon: Aviva Signs, MD;  Location: [facility] ORS;  Service: General;  Laterality: Left;   BIOPSY  11/26/2018   Procedure: BIOPSY;  Surgeon: Daneil Dolin, MD;  Location: [facility] ENDO SUITE;  Service: Endoscopy;;   CHOLECYSTECTOMY N/A 05/18/2019   Procedure: LAPAROSCOPIC CHOLECYSTECTOMY;  Surgeon: Aviva Signs, MD;  Location: [facility] ORS;  Service: General;  Laterality: N/A;   CORONARY ANGIOPLASTY WITH STENT PLACEMENT     2003   ESOPHAGOGASTRODUODENOSCOPY N/A 11/26/2018   mild erosive reflux esophagitis, gastric erythema. Negative H.pylori    INGUINAL HERNIA REPAIR Right 04/27/2019   Procedure: HERNIA REPAIR INGUINAL ADULT;  Surgeon: Aviva Signs, MD;  Location: [facility] ORS;  Service: General;  Laterality: Right;   KNEE SURGERY Right    lumbar fusion with cage  07/2017   NASAL SINUS SURGERY     TONSILLECTOMY AND ADENOIDECTOMY      Past Family History   Family History  Problem Relation Age of Onset   Colon polyps Father        thinks he may have had polyps, possibly in his 12s.    Heart attack Father    Dementia Mother    Hypotension Mother    Stroke Sister    Diabetes Maternal Grandmother    Stroke Maternal Grandfather    Cancer Paternal Grandmother    Congestive Heart Failure Paternal Grandfather    Kidney cancer Daughter    Colon cancer Neg Hx    Pancreatic cancer Neg Hx    Liver disease Neg Hx     Past Social History   Social History   Socioeconomic History   Marital status: Divorced    Spouse name: Not on file   Number of children: 2   Years of education: Not on file   Highest education level: Not on file  Occupational History   Occupation: employed    Comment: part time at [employer] home  Tobacco Use   Smoking status: Every Day     Packs/day: 0.50    Years: 25.00    Total pack years: 12.50    Types: Cigarettes   Smokeless tobacco: Never  Vaping Use   Vaping Use: Never used  Substance and Sexual Activity   Alcohol use: Yes    Comment: occ   Drug use: Never   Sexual activity: Yes  Other Topics Concern   Not on file  Social History Narrative   Not on file   Social Determinants of Health   Financial Resource Strain: Low Risk  (06/22/2020)   Overall Emergency planning/management officer Strain (  CARDIA)    Difficulty of Paying Living Expenses: Not hard at all  Food Insecurity: No Food Insecurity (06/22/2020)   Hunger Vital Sign    Worried About Running Out of Food in the Last Year: Never true    Ran Out of Food in the Last Year: Never true  Transportation Needs: No Transportation Needs (06/22/2020)   PRAPARE - Hydrologist (Medical): No    Lack of Transportation (Non-Medical): No  Physical Activity: Inactive (06/22/2020)   Exercise Vital Sign    Days of Exercise per Week: 0 days    Minutes of Exercise per Session: 0 min  Stress: No Stress Concern Present (06/22/2020)   [place]    Feeling of Stress : Not at all  Social Connections: Socially Isolated (06/22/2020)   Social Connection and Isolation Panel [NHANES]    Frequency of Communication with Friends and Family: More than three times a week    Frequency of Social Gatherings with Friends and Family: More than three times a week    Attends Religious Services: Never    Marine scientist or Organizations: No    Attends Archivist Meetings: Never    Marital Status: Divorced  Human resources officer Violence: Not At Risk (06/22/2020)   Humiliation, Afraid, Rape, and Kick questionnaire    Fear of Current or Ex-Partner: No    Emotionally Abused: No    Physically Abused: No    Sexually Abused: No    Review of Systems   General: Negative for anorexia, fever, chills,  fatigue, weakness.  See HPI ENT: Negative for hoarseness, difficulty swallowing , nasal congestion. CV: Negative for chest pain, angina, palpitations, dyspnea on exertion, peripheral edema.  Respiratory: Negative for dyspnea at rest, dyspnea on exertion, cough, sputum, wheezing.  GI: See history of present illness. GU:  Negative for dysuria, hematuria, urinary incontinence, urinary frequency, nocturnal urination.  Endo: Negative for unusual weight change.     Physical Exam   BP 119/76 (BP Location: Left Arm, Patient Position: Sitting, Cuff Size: Normal)   Pulse (!) 57   Temp (!) 97.3 F (36.3 C) (Temporal)   Ht $R'5\' 10"'TQ$  (1.778 m)   Wt 190 lb (86.2 kg)   SpO2 98%   BMI 27.26 kg/m    General: Well-nourished, well-developed in no acute distress.  Eyes: No icterus. Mouth: Oropharyngeal mucosa moist and pink , no lesions erythema or exudate. Lungs: Clear to auscultation bilaterally.  Heart: Regular rate and rhythm, no murmurs rubs or gallops.  Abdomen: Bowel sounds are normal, nontender, nondistended, no hepatosplenomegaly or masses,  no abdominal bruits no rebound or guarding.  Small umbilical hernia, nontender Rectal: not performed  Extremities: No lower extremity edema. No clubbing or deformities. Neuro: Alert and oriented x 4   Skin: Warm and dry, no jaundice.   Psych: Alert and cooperative, normal mood and affect.  Labs   Lab Results  Component Value Date   WBC 3.7 (L) 11/20/2021   HGB 13.5 11/20/2021   HCT 40.1 11/20/2021   MCV 89.7 11/20/2021   PLT 134 (L) 11/20/2021   Lab Results  Component Value Date   CREATININE 0.72 11/20/2021   BUN 11 11/20/2021   NA 137 11/20/2021   K 3.4 (L) 11/20/2021   CL 105 11/20/2021   CO2 24 11/20/2021   Lab Results  Component Value Date   ALT 70 (H) 11/20/2021   AST 41 11/20/2021   ALKPHOS  139 (H) 11/20/2021   BILITOT 0.9 11/20/2021   Lab Results  Component Value Date   IRON 23 (L) 11/20/2021   TIBC 310 11/20/2021    FERRITIN 53 11/20/2021    Imaging Studies   No results found.  Assessment   Chronic bloody diarrhea: Pleasant 61 year old male with history of stage IIIb small lymphocytic lymphoma diagnosed in 2020, responded nicely to treatment presenting with chronic diarrhea/rectal bleeding going on for about a year.  EGD and colonoscopy delayed in January 2023, patient reports that he could not afford procedures at that time given he had not met his high deductible.  He was admitted to the hospital in April 2023 with worsening bloody diarrhea.  Stool studies were positive for Salmonella. Treated with antibiotic therapy.  Patient tells me that he subsequently developed C. difficile, treated with vancomycin by his PCP.  He is back to his baseline chronic diarrhea with intermittent bleeding.  Continues to have vague abdominal pain.  Diminished appetite.  Weight loss.  No clear explanation at this time.  Dr. Delton Coombes requesting colonoscopy/EGD as well.  Differential quite broad including microscopic colitis, IBD, bile salt induced diarrhea, malignancy.  Recommend EGD/colonoscopy for further evaluation.  PLAN   Colonoscopy/EGD with Dr. Gala Romney.  ASA 3.  Hold Effient for 5 days prior to procedure.    I have discussed the risks, alternatives, benefits with regards to but not limited to the risk of reaction to medication, bleeding, infection, perforation and the patient is agreeable to proceed. Written consent to be obtained.   Laureen Ochs. Bobby Rumpf, [place], [facility]

## 2022-02-01 ENCOUNTER — Encounter: Payer: Self-pay | Admitting: *Deleted

## 2022-02-07 ENCOUNTER — Other Ambulatory Visit (HOSPITAL_COMMUNITY): Payer: Self-pay | Admitting: *Deleted

## 2022-02-07 DIAGNOSIS — C8300 Small cell B-cell lymphoma, unspecified site: Secondary | ICD-10-CM

## 2022-02-12 ENCOUNTER — Inpatient Hospital Stay (HOSPITAL_COMMUNITY): Payer: 59 | Attending: Hematology

## 2022-02-12 ENCOUNTER — Ambulatory Visit (HOSPITAL_COMMUNITY): Payer: 59

## 2022-02-12 DIAGNOSIS — Z79899 Other long term (current) drug therapy: Secondary | ICD-10-CM | POA: Diagnosis not present

## 2022-02-12 DIAGNOSIS — I2510 Atherosclerotic heart disease of native coronary artery without angina pectoris: Secondary | ICD-10-CM | POA: Diagnosis not present

## 2022-02-12 DIAGNOSIS — Z8572 Personal history of non-Hodgkin lymphomas: Secondary | ICD-10-CM | POA: Insufficient documentation

## 2022-02-12 DIAGNOSIS — Z9221 Personal history of antineoplastic chemotherapy: Secondary | ICD-10-CM | POA: Insufficient documentation

## 2022-02-12 DIAGNOSIS — F17210 Nicotine dependence, cigarettes, uncomplicated: Secondary | ICD-10-CM | POA: Insufficient documentation

## 2022-02-12 DIAGNOSIS — C8300 Small cell B-cell lymphoma, unspecified site: Secondary | ICD-10-CM

## 2022-02-12 LAB — COMPREHENSIVE METABOLIC PANEL
ALT: 11 U/L (ref 0–44)
AST: 16 U/L (ref 15–41)
Albumin: 3.7 g/dL (ref 3.5–5.0)
Alkaline Phosphatase: 56 U/L (ref 38–126)
Anion gap: 6 (ref 5–15)
BUN: 13 mg/dL (ref 8–23)
CO2: 26 mmol/L (ref 22–32)
Calcium: 8.9 mg/dL (ref 8.9–10.3)
Chloride: 107 mmol/L (ref 98–111)
Creatinine, Ser: 0.67 mg/dL (ref 0.61–1.24)
GFR, Estimated: >60 mL/min (ref >60)
Glucose, Bld: 80 mg/dL (ref 70–99)
Potassium: 3.8 mmol/L (ref 3.5–5.1)
Sodium: 139 mmol/L (ref 135–145)
Total Bilirubin: 1 mg/dL (ref 0.3–1.2)
Total Protein: 6.3 g/dL — ABNORMAL LOW (ref 6.5–8.1)

## 2022-02-12 LAB — CBC WITH DIFFERENTIAL/PLATELET
Abs Immature Granulocytes: 0.01 10*3/uL (ref 0.00–0.07)
Basophils Absolute: 0.1 10*3/uL (ref 0.0–0.1)
Basophils Relative: 1 %
Eosinophils Absolute: 0.1 10*3/uL (ref 0.0–0.5)
Eosinophils Relative: 2 %
HCT: 38.2 % — ABNORMAL LOW (ref 39.0–52.0)
Hemoglobin: 13.1 g/dL (ref 13.0–17.0)
Immature Granulocytes: 0 %
Lymphocytes Relative: 40 %
Lymphs Abs: 2.2 10*3/uL (ref 0.7–4.0)
MCH: 31.4 pg (ref 26.0–34.0)
MCHC: 34.3 g/dL (ref 30.0–36.0)
MCV: 91.6 fL (ref 80.0–100.0)
Monocytes Absolute: 0.4 10*3/uL (ref 0.1–1.0)
Monocytes Relative: 7 %
Neutro Abs: 2.8 10*3/uL (ref 1.7–7.7)
Neutrophils Relative %: 50 %
Platelets: 169 10*3/uL (ref 150–400)
RBC: 4.17 MIL/uL — ABNORMAL LOW (ref 4.22–5.81)
RDW: 13.3 % (ref 11.5–15.5)
WBC: 5.6 10*3/uL (ref 4.0–10.5)
nRBC: 0 % (ref 0.0–0.2)

## 2022-02-12 LAB — LACTATE DEHYDROGENASE: LDH: 131 U/L (ref 98–192)

## 2022-02-13 ENCOUNTER — Telehealth (HOSPITAL_COMMUNITY): Payer: Self-pay | Admitting: Hematology

## 2022-02-19 ENCOUNTER — Ambulatory Visit (HOSPITAL_COMMUNITY): Payer: 59

## 2022-02-19 ENCOUNTER — Ambulatory Visit (HOSPITAL_COMMUNITY): Payer: 59 | Admitting: Hematology

## 2022-02-27 ENCOUNTER — Other Ambulatory Visit: Payer: Self-pay | Admitting: *Deleted

## 2022-02-27 ENCOUNTER — Inpatient Hospital Stay: Payer: 59 | Attending: Hematology | Admitting: Hematology

## 2022-02-27 VITALS — BP 119/69 | HR 60 | Temp 98.1°F | Resp 18 | Wt 191.7 lb

## 2022-02-27 DIAGNOSIS — Z79899 Other long term (current) drug therapy: Secondary | ICD-10-CM | POA: Insufficient documentation

## 2022-02-27 DIAGNOSIS — F17210 Nicotine dependence, cigarettes, uncomplicated: Secondary | ICD-10-CM | POA: Diagnosis not present

## 2022-02-27 DIAGNOSIS — C8300 Small cell B-cell lymphoma, unspecified site: Secondary | ICD-10-CM | POA: Insufficient documentation

## 2022-02-27 NOTE — Progress Notes (Signed)
[place], [place] [address]   CLINIC:  Medical Oncology/Hematology  PCP:  Frances Maywood, FNP [address] [place] [address] [phone]   REASON FOR VISIT:  Follow-up for small lymphocytic lymphoma  PRIOR THERAPY:  1. Obinutuzumab & venetoclax x 6 cycles from 06/09/2019 to 10/27/2019. 2. Venetoclax 200 mg through 06/2020.  NGS Results: not done  CURRENT THERAPY: surveillance  BRIEF ONCOLOGIC HISTORY:  Oncology History  Small lymphocytic lymphoma ([place])  05/04/2019 Initial Diagnosis   Small lymphocytic lymphoma ([place])   06/09/2019 - 10/27/2019 Chemotherapy   Patient is on Treatment Plan : LEUKEMIA CLL Obinutuzumab q28d       CANCER STAGING: Cancer Staging  No matching staging information was found for the patient.  INTERVAL HISTORY:  [name], a 61 y.o. male, returns for routine follow-up of his small lymphocytic lymphoma. Patrick Rubio was last seen on 11/15/2021.   Today he reports feeling good. He denies weight loss, and he is eating well.   REVIEW OF SYSTEMS:  Review of Systems  Constitutional:  Negative for appetite change, fatigue and unexpected weight change.  Gastrointestinal:  Negative for diarrhea.  All other systems reviewed and are negative.   PAST MEDICAL/SURGICAL HISTORY:  Past Medical History:  Diagnosis Date   CAD (coronary artery disease)    GERD (gastroesophageal reflux disease)    HTN (hypertension)    Hypercholesterolemia    MI (myocardial infarction) ([place]) 2003   Small lymphocytic lymphoma ([place])    Stomach cancer ([place])    Past Surgical History:  Procedure Laterality Date   AXILLARY LYMPH NODE BIOPSY Left 04/27/2019   Procedure: AXILLARY LYMPH NODE BIOPSY;  Surgeon: Aviva Signs, MD;  Location: [facility] ORS;  Service: General;  Laterality: Left;   BIOPSY  11/26/2018   Procedure: BIOPSY;  Surgeon: Daneil Dolin, MD;  Location: [facility] ENDO SUITE;  Service: Endoscopy;;   CHOLECYSTECTOMY N/A  05/18/2019   Procedure: LAPAROSCOPIC CHOLECYSTECTOMY;  Surgeon: Aviva Signs, MD;  Location: [facility];  Service: General;  Laterality: N/A;   CORONARY ANGIOPLASTY WITH STENT PLACEMENT     2003   ESOPHAGOGASTRODUODENOSCOPY N/A 11/26/2018   mild erosive reflux esophagitis, gastric erythema. 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  Tobacco Use   Smoking status: Every Day    Packs/day: 0.50    Years: 25.00    Total pack years: 12.50    Types: Cigarettes   Smokeless tobacco: Never  Vaping Use   Vaping Use: Never used  Substance and Sexual Activity   Alcohol use: Yes    Comment: occ   Drug use: Never   Sexual activity: Yes  Other Topics Concern   Not on file  Social History Narrative   Not on file   Social Determinants of Radio broadcast assistant  Strain: Low Risk  (06/22/2020)   Overall Financial Resource Strain (CARDIA)    Difficulty of Paying Living Expenses: Not hard at all  Food Insecurity: No Food Insecurity (06/22/2020)   Hunger Vital Sign    Worried About Running Out of Food in the Last Year: Never true    Ran Out of Food in the Last Year: Never true  Transportation Needs: No Transportation Needs (06/22/2020)   PRAPARE - Hydrologist (Medical): No    Lack of Transportation (Non-Medical): No  Physical Activity: Inactive (06/22/2020)   Exercise Vital Sign    Days of Exercise per  Week: 0 days    Minutes of Exercise per Session: 0 min  Stress: No Stress Concern Present (06/22/2020)   [place]    Feeling of Stress : Not at all  Social Connections: Socially Isolated (06/22/2020)   Social Connection and Isolation Panel [NHANES]    Frequency of Communication with Friends and Family: More than three times a week    Frequency of Social Gatherings with Friends and Family: More than three times a week    Attends Religious Services: Never    Marine scientist or Organizations: No    Attends Archivist Meetings: Never    Marital Status: Divorced  Human resources officer Violence: Not At Risk (06/22/2020)   Humiliation, Afraid, Rape, and Kick questionnaire    Fear of Current or Ex-Partner: No    Emotionally Abused: No    Physically Abused: No    Sexually Abused: No    FAMILY HISTORY:  Family History  Problem Relation Age of Onset   Colon polyps Father        thinks he may have had polyps, possibly in his 76s.    Heart attack Father    Dementia Mother    Hypotension Mother    Stroke Sister    Diabetes Maternal Grandmother    Stroke Maternal Grandfather    Cancer Paternal Grandmother    Congestive Heart Failure Paternal Grandfather    Kidney cancer Daughter    Colon cancer Neg Hx    Pancreatic cancer Neg Hx    Liver disease Neg Hx     CURRENT MEDICATIONS:  Current Outpatient Medications  Medication Sig Dispense Refill   acetaminophen (TYLENOL) 650 MG CR tablet Take 650 mg by mouth in the morning and at bedtime.     aspirin EC 81 MG tablet Take 81 mg by mouth every Monday, Wednesday, and Friday. IN THE MORNING     baclofen (LIORESAL) 10 MG tablet Take 10 mg by mouth 2 (two) times daily.     carvedilol (COREG) 6.25 MG tablet Take 6.25 mg by mouth 2 (two) times daily.     celecoxib (CELEBREX) 200 MG capsule Take 200 mg by mouth in the morning and at bedtime.     cholecalciferol (VITAMIN  D3) 25 MCG (1000 UNIT) tablet Take 1,000 Units by mouth in the morning and at bedtime.     escitalopram (LEXAPRO) 20 MG tablet Take 20 mg by mouth in the morning.     gabapentin (NEURONTIN) 300 MG capsule Take 300 mg by mouth 2 (two) times daily.     Na Sulfate-K Sulfate-Mg Sulf 17.5-3.13-1.6 GM/177ML SOLN Take 354 mLs by mouth as directed.     nitroGLYCERIN (NITROSTAT) 0.4 MG SL tablet Place 0.4 mg under the tongue every 5 (five) minutes x 3 doses as needed for chest pain.  pantoprazole (PROTONIX) 40 MG tablet TAKE 1 TABLET BY MOUTH EVERY DAY 90 tablet 3   Pitavastatin Calcium (LIVALO) 4 MG TABS Take 4 mg by mouth every evening.     prasugrel (EFFIENT) 10 MG TABS tablet Take 10 mg by mouth in the morning.     sacubitril-valsartan (ENTRESTO) 24-26 MG Take 1 tablet by mouth 2 (two) times daily.     No current facility-administered medications for this visit.    ALLERGIES:  No Known Allergies  PHYSICAL EXAM:  Performance status (ECOG): 1 - Symptomatic but completely ambulatory  Vitals:   02/27/22 1100  BP: 119/69  Pulse: 60  Resp: 18  Temp: 98.1 F (36.7 C)  SpO2: 99%   Wt Readings from Last 3 Encounters:  02/27/22 191 lb 11.2 oz (87 kg)  01/31/22 190 lb (86.2 kg)  11/18/21 183 lb 10.3 oz (83.3 kg)   Physical Exam Vitals reviewed.  Constitutional:      Appearance: Normal appearance.  Cardiovascular:     Rate and Rhythm: Normal rate and regular rhythm.     Pulses: Normal pulses.     Heart sounds: Normal heart sounds.  Pulmonary:     Effort: Pulmonary effort is normal.     Breath sounds: Normal breath sounds.  Abdominal:     Palpations: Abdomen is soft. There is no hepatomegaly, splenomegaly or mass.     Tenderness: There is no abdominal tenderness.  Musculoskeletal:     Right lower leg: No edema.     Left lower leg: No edema.  Lymphadenopathy:     Upper Body:     Right upper body: No supraclavicular or axillary adenopathy.     Left upper body: No supraclavicular  or axillary adenopathy.     Lower Body: No right inguinal adenopathy. No left inguinal adenopathy.  Neurological:     General: No focal deficit present.     Mental Status: He is alert and oriented to person, place, and time.  Psychiatric:        Mood and Affect: Mood normal.        Behavior: Behavior normal.      LABORATORY DATA:  I have reviewed the labs as listed.     Latest Ref Rng & Units 02/12/2022    9:01 AM 11/20/2021    4:36 AM 11/19/2021    5:18 AM  CBC  WBC 4.0 - 10.5 K/uL 5.6  3.7  4.6   Hemoglobin 13.0 - 17.0 g/dL 13.1  13.5  12.3   Hematocrit 39.0 - 52.0 % 38.2  40.1  36.5   Platelets 150 - 400 K/uL 169  134  136       Latest Ref Rng & Units 02/12/2022    9:01 AM 11/20/2021    4:36 AM 11/19/2021    5:18 AM  CMP  Glucose 70 - 99 mg/dL 80  89  101   BUN 8 - 23 mg/dL '13  11  12   '$ Creatinine 0.61 - 1.24 mg/dL 0.67  0.72  0.63   Sodium 135 - 145 mmol/L 139  137  137   Potassium 3.5 - 5.1 mmol/L 3.8  3.4  3.7   Chloride 98 - 111 mmol/L 107  105  108   CO2 22 - 32 mmol/L '26  24  23   '$ Calcium 8.9 - 10.3 mg/dL 8.9  8.5  8.3   Total Protein 6.5 - 8.1 g/dL 6.3  6.4  5.9   Total Bilirubin 0.3 - 1.2 mg/dL  1.0  0.9  0.8   Alkaline Phos 38 - 126 U/L 56  139  171   AST 15 - 41 U/L 16  41  137   ALT 0 - 44 U/L 11  70  112     DIAGNOSTIC IMAGING:  I have independently reviewed the scans and discussed with the patient. No results found.   ASSESSMENT:  1.  Clinical stage IIIb small lymphocytic lymphoma: -6 cycles of obinutuzumab and venetoclax from 06/09/2019 through 10/27/2019. -PET scan on 11/23/2019 showed complete metabolic response to therapy of lymphoma.  Slight hypermetabolism in the right inguinal region from hernia surgery.  New hypermetabolic soft tissue fullness about the ischial tuberosity, possibly related to hamstring origin injury. -Venetoclax was dose reduced to 200 mg daily secondary to carvedilol. -PET scan on June 20, 2020 with no findings suspicious for  active lymphoma. -Venetoclax discontinued in December 2021. - PET scan on 06/20/2020 did not show any evidence of lymphoma. - MRD flow cytometry on 06/13/2020 showed extremely small abnormal B-cell population, less than 0.01% of total cells.   2.  CAD: -Cardiac catheterization on 10/01/2019 found to have 100% occlusion of the RCA. -Angioplasty and stenting of the circumflex was done. -He was started on Effient and Livalo. -Echocardiogram on 01/18/2020 by Dr. Sabra Heck in [place] LVEF 50-55% with mild global hypokinesia, moderate LVH, normal RV function, stage I diastolic dysfunction.   3.  Right posterior hip pain: -MRI of the pelvis without contrast on 01/10/2020 shows complete tear of the right conjoined semitendinosus and biceps femoris tendon at the ischial tuberosity with 2.2 cm retraction.  High-grade partial tear of the right semimembranosus tendon at the ischial tuberosity. -He was evaluated by Dr. Mardelle Matte and conservative management recommended.   PLAN:  1.  Clinical stage IIIb small lymphocytic lymphoma: - He was recently hospitalized with GI infection when stool panel showed Salmonella. - After discharge, he had C. difficile infection and was treated for 2 weeks. - CTAP done in the ER on 11/18/2021 reviewed by me did not show any abdominal or pelvic adenopathy. - He is eating better and is not losing weight.  He gained 4 pounds since last visit. - Reviewed labs from 02/12/2022 which showed normal LFTs and creatinine.  LDH was normal.  CBC was grossly normal. - Recommend follow-up in 6 months with repeat labs. - Proceed with EGD/colonoscopy by Dr. Gala Romney.       Orders placed this encounter:  No orders of the defined types were placed in this encounter.    Derek Jack, MD [place] [phone]   I, Thana Ates, am acting as a scribe for Dr. Derek Jack.  I, Derek Jack MD, have reviewed the above documentation for accuracy and  completeness, and I agree with the above.

## 2022-02-27 NOTE — Patient Instructions (Addendum)
[place] at [facility] Discharge Instructions   You were seen and examined today by Dr. Delton Coombes.  Return as scheduled in 6 months.    Thank you for choosing [place] at [facility] to provide your oncology and hematology care.  To afford each patient quality time with our provider, please arrive at least 15 minutes before your scheduled appointment time.   If you have a lab appointment with the [facility] please come in thru the Main Entrance and check in at the main information desk.  You need to re-schedule your appointment should you arrive 10 or more minutes late.  We strive to give you quality time with our providers, and arriving late affects you and other patients whose appointments are after yours.  Also, if you no show three or more times for appointments you may be dismissed from the clinic at the providers discretion.     Again, thank you for choosing [facility].  Our hope is that these requests will decrease the amount of time that you wait before being seen by our physicians.       _____________________________________________________________  Should you have questions after your visit to [facility], please contact our office at [phone] and follow the prompts.  Our office hours are 8:00 a.m. and 4:30 p.m. Monday - Friday.  Please note that voicemails left after 4:00 p.m. may not be returned until the following business day.  We are closed weekends and major holidays.  You do have access to a nurse 24-7, just call the main number to the clinic [phone] and do not press any options, hold on the line and a nurse will answer the phone.    For prescription refill requests, have your pharmacy contact our office and allow 72 hours.    Due to Covid, you will need to wear a mask upon entering the hospital. If you do not have a mask, a mask will be given to you at the Main Entrance upon arrival.  For doctor visits, patients may have 1 support person age 91 or older with them. For treatment visits, patients can not have anyone with them due to social distancing guidelines and our immunocompromised population.

## 2022-03-02 ENCOUNTER — Encounter (HOSPITAL_COMMUNITY): Payer: Self-pay

## 2022-03-02 ENCOUNTER — Other Ambulatory Visit: Payer: Self-pay

## 2022-03-02 ENCOUNTER — Encounter (HOSPITAL_COMMUNITY)
Admission: RE | Admit: 2022-03-02 | Discharge: 2022-03-02 | Disposition: A | Discharge status: Home / Self Care | Payer: 59 | Source: Ambulatory Visit | Attending: Internal Medicine | Admitting: Internal Medicine

## 2022-03-08 ENCOUNTER — Ambulatory Visit (HOSPITAL_COMMUNITY)
Admission: RE | Admit: 2022-03-08 | Discharge: 2022-03-08 | Disposition: A | Discharge status: Home / Self Care | Payer: 59 | Source: Ambulatory Visit | Attending: Internal Medicine | Admitting: Internal Medicine

## 2022-03-08 ENCOUNTER — Ambulatory Visit (HOSPITAL_BASED_OUTPATIENT_CLINIC_OR_DEPARTMENT_OTHER): Payer: 59 | Admitting: Anesthesiology

## 2022-03-08 ENCOUNTER — Ambulatory Visit (HOSPITAL_COMMUNITY): Payer: 59 | Admitting: Anesthesiology

## 2022-03-08 ENCOUNTER — Encounter (HOSPITAL_COMMUNITY)
Admission: RE | Disposition: A | Discharge status: Home / Self Care | Payer: Self-pay | Source: Ambulatory Visit | Attending: Internal Medicine

## 2022-03-08 DIAGNOSIS — F17210 Nicotine dependence, cigarettes, uncomplicated: Secondary | ICD-10-CM | POA: Insufficient documentation

## 2022-03-08 DIAGNOSIS — I2510 Atherosclerotic heart disease of native coronary artery without angina pectoris: Secondary | ICD-10-CM | POA: Insufficient documentation

## 2022-03-08 DIAGNOSIS — K219 Gastro-esophageal reflux disease without esophagitis: Secondary | ICD-10-CM | POA: Insufficient documentation

## 2022-03-08 DIAGNOSIS — Z8572 Personal history of non-Hodgkin lymphomas: Secondary | ICD-10-CM | POA: Insufficient documentation

## 2022-03-08 DIAGNOSIS — K529 Noninfective gastroenteritis and colitis, unspecified: Secondary | ICD-10-CM | POA: Insufficient documentation

## 2022-03-08 DIAGNOSIS — D649 Anemia, unspecified: Secondary | ICD-10-CM | POA: Insufficient documentation

## 2022-03-08 DIAGNOSIS — K319 Disease of stomach and duodenum, unspecified: Secondary | ICD-10-CM | POA: Insufficient documentation

## 2022-03-08 DIAGNOSIS — R630 Anorexia: Secondary | ICD-10-CM | POA: Insufficient documentation

## 2022-03-08 DIAGNOSIS — K2950 Unspecified chronic gastritis without bleeding: Secondary | ICD-10-CM | POA: Diagnosis not present

## 2022-03-08 DIAGNOSIS — K921 Melena: Secondary | ICD-10-CM | POA: Diagnosis not present

## 2022-03-08 DIAGNOSIS — R1013 Epigastric pain: Secondary | ICD-10-CM | POA: Insufficient documentation

## 2022-03-08 DIAGNOSIS — I509 Heart failure, unspecified: Secondary | ICD-10-CM | POA: Insufficient documentation

## 2022-03-08 DIAGNOSIS — R634 Abnormal weight loss: Secondary | ICD-10-CM | POA: Diagnosis not present

## 2022-03-08 DIAGNOSIS — Z8619 Personal history of other infectious and parasitic diseases: Secondary | ICD-10-CM | POA: Insufficient documentation

## 2022-03-08 DIAGNOSIS — I252 Old myocardial infarction: Secondary | ICD-10-CM | POA: Diagnosis not present

## 2022-03-08 DIAGNOSIS — Z955 Presence of coronary angioplasty implant and graft: Secondary | ICD-10-CM | POA: Diagnosis not present

## 2022-03-08 DIAGNOSIS — I110 Hypertensive heart disease with heart failure: Secondary | ICD-10-CM | POA: Diagnosis not present

## 2022-03-08 HISTORY — PX: BIOPSY: SHX5522

## 2022-03-08 HISTORY — PX: COLONOSCOPY WITH PROPOFOL: SHX5780

## 2022-03-08 HISTORY — PX: ESOPHAGOGASTRODUODENOSCOPY (EGD) WITH PROPOFOL: SHX5813

## 2022-03-08 SURGERY — COLONOSCOPY WITH PROPOFOL
Anesthesia: General

## 2022-03-08 MED ORDER — PHENYLEPHRINE HCL (PRESSORS) 10 MG/ML IV SOLN
INTRAVENOUS | Status: DC | PRN
  Administered 2022-03-08 (×2): 160 ug via INTRAVENOUS

## 2022-03-08 MED ORDER — PROPOFOL 10 MG/ML IV BOLUS
INTRAVENOUS | Status: DC | PRN
  Administered 2022-03-08: 100 mg via INTRAVENOUS
  Administered 2022-03-08 (×2): 30 mg via INTRAVENOUS
  Administered 2022-03-08: 50 mg via INTRAVENOUS

## 2022-03-08 MED ORDER — LIDOCAINE HCL (CARDIAC) PF 100 MG/5ML IV SOSY
PREFILLED_SYRINGE | INTRAVENOUS | Status: DC | PRN
  Administered 2022-03-08: 60 mg via INTRATRACHEAL

## 2022-03-08 MED ORDER — LACTATED RINGERS IV SOLN: INTRAVENOUS | Status: DC | PRN

## 2022-03-08 MED ORDER — LACTATED RINGERS IV SOLN: INTRAVENOUS | Status: DC

## 2022-03-08 MED ORDER — PROPOFOL 500 MG/50ML IV EMUL
INTRAVENOUS | Status: DC | PRN
  Administered 2022-03-08: 175 ug/kg/min via INTRAVENOUS

## 2022-03-08 NOTE — Discharge Instructions (Signed)
  Colonoscopy Discharge Instructions  Read the instructions outlined below and refer to this sheet in the next few weeks. These discharge instructions provide you with general information on caring for yourself after you leave the hospital. Your doctor may also give you specific instructions. While your treatment has been planned according to the most current medical practices available, unavoidable complications occasionally occur. If you have any problems or questions after discharge, call Dr. Gala Romney at [phone]. ACTIVITY You may resume your regular activity, but move at a slower pace for the next 24 hours.  Take frequent rest periods for the next 24 hours.  Walking will help get rid of the air and reduce the bloated feeling in your belly (abdomen).  No driving for 24 hours (because of the medicine (anesthesia) used during the test).   Do not sign any important legal documents or operate any machinery for 24 hours (because of the anesthesia used during the test).  NUTRITION Drink plenty of fluids.  You may resume your normal diet as instructed by your doctor.  Begin with a light meal and progress to your normal diet. Heavy or fried foods are harder to digest and may make you feel sick to your stomach (nauseated).  Avoid alcoholic beverages for 24 hours or as instructed.  MEDICATIONS You may resume your normal medications unless your doctor tells you otherwise.  WHAT YOU CAN EXPECT TODAY Some feelings of bloating in the abdomen.  Passage of more gas than usual.  Spotting of blood in your stool or on the toilet paper.  IF YOU HAD POLYPS REMOVED DURING THE COLONOSCOPY: No aspirin products for 7 days or as instructed.  No alcohol for 7 days or as instructed.  Eat a soft diet for the next 24 hours.  FINDING OUT THE RESULTS OF YOUR TEST Not all test results are available during your visit. If your test results are not back during the visit, make an appointment with your caregiver to find out the  results. Do not assume everything is normal if you have not heard from your caregiver or the medical facility. It is important for you to follow up on all of your test results.  SEEK IMMEDIATE MEDICAL ATTENTION IF: You have more than a spotting of blood in your stool.  Your belly is swollen (abdominal distention).  You are nauseated or vomiting.  You have a temperature over 101.  You have abdominal pain or discomfort that is severe or gets worse throughout the day.     Your stomach appeared inflamed today.  Biopsies were taken.  Your colon appeared entirely normal.  Recommend you have 1 more colonoscopy in 10 years for colon cancer screening  You should begin a probiotic daily to help balance the good bacteria in your colon.  They are all over-the-counter agents.  Align, digestive advantage or Hardin Negus' colon health are 3 popular preparations.  Office visit with Neil Crouch in 4 weeks  Other recommendations to follow pending review of pathology report patient request, I called Patrick Rubio at [phone]

## 2022-03-08 NOTE — Anesthesia Preprocedure Evaluation (Signed)
Anesthesia Evaluation  Patient identified by MRN, date of birth, ID band Patient awake    Reviewed: Allergy & Precautions, NPO status , Patient's Chart, lab work & pertinent test results, reviewed documented beta blocker date and time   Airway Mallampati: II  TM Distance: >3 FB Neck ROM: Full    Dental  (+) Dental Advisory Given, Teeth Intact   Pulmonary Current Smoker and Patient abstained from smoking.,    Pulmonary exam normal breath sounds clear to auscultation       Cardiovascular Exercise Tolerance: Good hypertension, Pt. on medications and Pt. on home beta blockers + CAD, + Past MI, + Cardiac Stents and +CHF   Rhythm:Irregular Rate:Normal  PVCs 18-Nov-2021 17:19:57 [facility]-ED ROUTINE RECORD [birth year] (74 yr) Male Caucasian Vent. rate 71 BPM PR interval 184 ms QRS duration 126 ms QT/QTcB 414/449 ms P-R-T axes 68 -4 12 Sinus rhythm with frequent Premature ventricular complexes Non-specific intra-ventricular conduction block Minimal voltage criteria for LVH, may be normal variant ( Cornell product ) Abnormal ECG When compared with ECG of 23-Apr-2019 10:06, Sinus rhythm has replaced Ectopic atrial rhythm increased ectopy Confirmed by Aletta Edouard [phone]) on 11/18/2021 5:35:05 PM   Neuro/Psych negative neurological ROS  negative psych ROS   GI/Hepatic Neg liver ROS, GERD  Medicated and Controlled,Stomach cancer   Endo/Other  negative endocrine ROS  Renal/GU negative Renal ROS  negative genitourinary   Musculoskeletal negative musculoskeletal ROS (+)   Abdominal   Peds negative pediatric ROS (+)  Hematology  (+) Blood dyscrasia (lymphoma), anemia ,   Anesthesia Other Findings   Reproductive/Obstetrics negative OB ROS                             Anesthesia Physical Anesthesia Plan  ASA: 3  Anesthesia Plan: General   Post-op Pain Management: Minimal  or no pain anticipated   Induction: Intravenous  PONV Risk Score and Plan: Propofol infusion  Airway Management Planned: Nasal Cannula and Natural Airway  Additional Equipment:   Intra-op Plan:   Post-operative Plan:   Informed Consent: I have reviewed the patients History and Physical, chart, labs and discussed the procedure including the risks, benefits and alternatives for the proposed anesthesia with the patient or authorized representative who has indicated his/her understanding and acceptance.     Dental advisory given  Plan Discussed with: CRNA and Surgeon  Anesthesia Plan Comments:         Anesthesia Quick Evaluation

## 2022-03-08 NOTE — Op Note (Signed)
[facility] Patient Name: Patrick Rubio Procedure Date: 03/08/2022 8:40 AM MRN: [id number] Date of Birth: Feb 23, 1961 Attending MD: Norvel Richards , MD CSN: [id number] Age: 61 Admit Type: Outpatient Procedure:                Colonoscopy Indications:              Hematochezia Providers:                Norvel Richards, MD, Lambert Mody,                            Raphael Gibney, Technician Referring MD:              Medicines:                Propofol per Anesthesia Complications:            No immediate complications. Estimated Blood Loss:     Estimated blood loss: none. Procedure:                After obtaining informed consent, the colonoscope                            was passed under direct vision. Throughout the                            procedure, the patient's blood pressure, pulse, and                            oxygen saturations were monitored continuously. The                            [id number]) scope was introduced through the                            anus and advanced to the 10 cm into the ileum. The                            colonoscopy was performed without difficulty. The                            patient tolerated the procedure well. The quality                            of the bowel preparation was adequate. The terminal                            ileum, ileocecal valve, appendiceal orifice, and                            rectum were photographed. The entire colon was well                            visualized. Scope In: 9:04:19 AM Scope Out: 9:16:02 AM Scope Withdrawal Time: 0 hours 7 minutes 53 seconds  Total Procedure Duration: 0 hours 11 minutes 43 seconds  Findings:  The perianal and digital rectal examinations were normal.      The colon (entire examined portion) appeared normal. Distal 10 cm's of       terminal ileum mucosa appeared normal.      The retroflexed view of the distal rectum and anal verge was normal and        showed no anal or rectal abnormalities. Impression:               - The entire examined colon is normal.                           - The distal rectum and anal verge are normal on                            retroflexion view.                           - No specimens collected. Moderate Sedation:      Moderate (conscious) sedation was personally administered by an       anesthesia professional. The following parameters were monitored: oxygen       saturation, heart rate, blood pressure, respiratory rate, EKG, adequacy       of pulmonary ventilation, and response to care. Recommendation:           - Patient has a contact number available for                            emergencies. The signs and symptoms of potential                            delayed complications were discussed with the                            patient. Return to normal activities tomorrow.                            Written discharge instructions were provided to the                            patient.                           - Resume previous diet.                           - Continue present medications.                           - Await pathology results.                           - Repeat colonoscopy in 10 years for screening                            purposes. Begin probiotic daily. See EGD report.                           -  Return to GI office in 1 month. Procedure Code(s):        --- Professional ---                           [phone], Colonoscopy, flexible; diagnostic, including                            collection of specimen(s) by brushing or washing,                            when performed (separate procedure) Diagnosis Code(s):        --- Professional ---                           K92.1, Melena (includes Hematochezia) CPT copyright 2019 American Medical Association. All rights reserved. The codes documented in this report are preliminary and upon coder review may  be revised to meet current  compliance requirements. [name]. Andilynn Delavega, MD Norvel Richards, MD 03/08/2022 9:29:39 AM This report has been signed electronically. Number of Addenda: 0

## 2022-03-08 NOTE — H&P (Signed)
$'@LOGO'c$ @   Primary Care Physician:  Frances Maywood, FNP Primary Gastroenterologist:  Dr. Gala Romney  Pre-Procedure History & Physical: HPI:  Patrick Rubio is a 61 y.o. male here for further evaluation of nonspecific abdominal pain, anorexia weight loss history of lymphoma.  Hospitalization earlier this year for Salmonella complicated by C. difficile.  Intermittent rectal bleeding.  No prior colonoscopy. Denies dysphagia.  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[place])    Stomach cancer [facility])     Past Surgical History:  Procedure Laterality Date   AXILLARY LYMPH NODE BIOPSY Left 04/27/2019   Procedure: AXILLARY LYMPH NODE BIOPSY;  Surgeon: Aviva Signs, MD;  Location: [facility] ORS;  Service: General;  Laterality: Left;   BIOPSY  11/26/2018   Procedure: BIOPSY;  Surgeon: Daneil Dolin, MD;  Location: [facility] ENDO SUITE;  Service: Endoscopy;;   CHOLECYSTECTOMY N/A 05/18/2019   Procedure: LAPAROSCOPIC CHOLECYSTECTOMY;  Surgeon: Aviva Signs, MD;  Location: [facility] ORS;  Service: General;  Laterality: N/A;   CORONARY ANGIOPLASTY WITH STENT PLACEMENT     2003   ESOPHAGOGASTRODUODENOSCOPY N/A 11/26/2018   mild erosive reflux esophagitis, gastric erythema. Negative H.pylori    INGUINAL HERNIA REPAIR Right 04/27/2019   Procedure: HERNIA REPAIR INGUINAL ADULT;  Surgeon: Aviva Signs, MD;  Location: [facility] ORS;  Service: General;  Laterality: Right;   KNEE SURGERY Right    lumbar fusion with cage  07/2017   NASAL SINUS SURGERY     TONSILLECTOMY AND ADENOIDECTOMY      Prior to Admission medications   Medication Sig Start Date End Date Taking? Authorizing Provider  acetaminophen (TYLENOL) 650 MG CR tablet Take 650 mg by mouth in the morning and at bedtime.   Yes [provider]  aspirin EC 81 MG tablet Take 81 mg by  mouth every Monday, Wednesday, and Friday. IN THE MORNING   Yes [provider]  baclofen (LIORESAL) 10 MG tablet Take 10 mg by mouth 2 (two) times daily. 07/09/21  Yes [provider]  carvedilol (COREG) 6.25 MG tablet Take 6.25 mg by mouth 2 (two) times daily. 05/25/20  Yes [provider]  celecoxib (CELEBREX) 200 MG capsule Take 200 mg by mouth in the morning and at bedtime. 01/25/22  Yes [provider]  cholecalciferol (VITAMIN D3) 25 MCG (1000 UNIT) tablet Take 1,000 Units by mouth in the morning and at bedtime.   Yes [provider]  escitalopram (LEXAPRO) 20 MG tablet Take 20 mg by mouth in the morning. 10/06/18  Yes [provider]  gabapentin (NEURONTIN) 300 MG capsule Take 300 mg by mouth 2 (two) times daily. 06/28/21  Yes [provider]  nitroGLYCERIN (NITROSTAT) 0.4 MG SL tablet Place 0.4 mg under the tongue every 5 (five) minutes x 3 doses as needed for chest pain.   Yes [provider]  pantoprazole (PROTONIX) 40 MG tablet TAKE 1 TABLET BY MOUTH EVERY DAY 08/30/21  Yes [name], NP  Pitavastatin Calcium (LIVALO) 4 MG TABS Take 4 mg by mouth every evening.   Yes [provider]  prasugrel (EFFIENT) 10 MG TABS tablet Take 10 mg by mouth in the morning. 10/02/19  Yes [provider]  sacubitril-valsartan (ENTRESTO) 24-26 MG Take 1 tablet by mouth 2 (two) times daily.   Yes [provider]  Na Sulfate-K Sulfate-Mg Sulf 17.5-3.13-1.6 GM/177ML SOLN Take 354 mLs by mouth as directed. 02/12/22   [provider]    Allergies as of 01/31/2022   (No Known Allergies)    Family History  Problem Relation Age of Onset   Colon polyps Father        thinks he may have had polyps, possibly in his 105s.    Heart attack Father    Dementia Mother    Hypotension Mother    Stroke Sister    Diabetes Maternal Grandmother    Stroke Maternal Grandfather    Cancer Paternal Grandmother     Congestive Heart Failure Paternal Grandfather    Kidney cancer Daughter    Colon cancer Neg Hx    Pancreatic cancer Neg Hx    Liver disease Neg Hx     Social History   Socioeconomic History   Marital status: Divorced    Spouse name: Not on file   Number of children: 2   Years of education: Not on file   Highest education level: Not on file  Occupational History   Occupation: employed    Comment: part time at [employer] home  Tobacco Use   Smoking status: Every Day    Packs/day: 0.50    Years: 25.00    Total pack years: 12.50    Types: Cigarettes   Smokeless tobacco: Never  Vaping Use   Vaping Use: Never used  Substance and Sexual Activity   Alcohol use: Yes    Comment: occ   Drug use: Never   Sexual activity: Yes  Other Topics Concern   Not on file  Social History Narrative   Not on file   Social Determinants of Health   Financial Resource Strain: Low Risk  (06/22/2020)   Overall Financial Resource Strain (CARDIA)    Difficulty of Paying Living Expenses: Not hard at all  Food Insecurity: No Food Insecurity (06/22/2020)   Hunger Vital Sign    Worried About Running Out of Food in the Last Year: Never true    Ran Out of Food in the Last Year: Never true  Transportation Needs: No Transportation Needs (06/22/2020)   PRAPARE - Hydrologist (Medical): No    Lack of Transportation (Non-Medical): No  Physical Activity: Inactive (06/22/2020)   Exercise Vital Sign    Days of Exercise per Week: 0 days    Minutes of Exercise per Session: 0 min  Stress: No Stress Concern Present (06/22/2020)   [place]    Feeling of Stress : Not at all  Social Connections: Socially Isolated (06/22/2020)   Social Connection and Isolation Panel [NHANES]    Frequency of Communication with Friends and Family: More than three times a week    Frequency of Social Gatherings with Friends and Family: More  than three times a week    Attends Religious Services: Never    Marine scientist or Organizations: No    Attends Archivist Meetings: Never    Marital Status: Divorced  Human resources officer Violence: Not At Risk (06/22/2020)   Humiliation, Afraid, Rape, and Kick questionnaire    Fear of Current or Ex-Partner: No    Emotionally Abused: No    Physically Abused: No    Sexually Abused: No    Review of Systems: See HPI, otherwise negative ROS  Physical Exam: BP 110/86   Pulse 87   Temp 97.8 F (36.6 C) (Oral)  Resp 16   SpO2 99%  General:   Alert,  Well-developed, well-nourished, pleasant and cooperative in NAD Neck:  Supple; no masses or thyromegaly. No significant cervical adenopathy. Lungs:  Clear throughout to auscultation.   No wheezes, crackles, or rhonchi. No acute distress. Heart:  Regular rate and rhythm; no murmurs, clicks, rubs,  or gallops. Abdomen: Non-distended, normal bowel sounds.  Soft and nontender without appreciable mass or hepatosplenomegaly.  Pulses:  Normal pulses noted. Extremities:  Without clubbing or edema.  Impression/Plan: 61 year old gentleman with nonspecific abdominal pain early satiety weight loss intermittent rectal bleeding.  No prior colonoscopy.  Denies dysphagia. Per plan, I have offered the patient a diagnostic EGD followed by colonoscopy per plan.  The risks, benefits, limitations, imponderables and alternatives regarding both EGD and colonoscopy have been reviewed with the patient. Questions have been answered. All parties agreeable.       Notice: This dictation was prepared with Dragon dictation along with smaller phrase technology. Any transcriptional errors that result from this process are unintentional and may not be corrected upon review.

## 2022-03-08 NOTE — Op Note (Signed)
[facility] Patient Name: Patrick Rubio Procedure Date: 03/08/2022 8:41 AM MRN: [id number] Date of Birth: [birth year] Attending MD: Norvel Richards , MD CSN: [id number] Age: 61 Admit Type: Outpatient Procedure:                Upper GI endoscopy Indications:              Dyspepsia, Anorexia, Weight loss; history of                            lymphoma, Salmonella infection earlier this year                            compounded by C. difficile Providers:                Norvel Richards, MD, Lambert Mody,                            Raphael Gibney, Technician Referring MD:              Medicines:                Propofol per Anesthesia Complications:            No immediate complications. Estimated Blood Loss:     Estimated blood loss was minimal. Procedure:                Pre-Anesthesia Assessment:                           - Prior to the procedure, a History and Physical                            was performed, and patient medications and                            allergies were reviewed. The patient's tolerance of                            previous anesthesia was also reviewed. The risks                            and benefits of the procedure and the sedation                            options and risks were discussed with the patient.                            All questions were answered, and informed consent                            was obtained. Prior Anticoagulants: The patient has                            taken no previous anticoagulant or antiplatelet  agents. ASA Grade Assessment: III - A patient with                            severe systemic disease. After reviewing the risks                            and benefits, the patient was deemed in                            satisfactory condition to undergo the procedure.                           After obtaining informed consent, the endoscope was                            passed  under direct vision. Throughout the                            procedure, the patient's blood pressure, pulse, and                            oxygen saturations were monitored continuously. The                            [id number]-H190 ([id number]) scope was introduced through the                            mouth, and advanced to the second part of duodenum.                            The upper GI endoscopy was accomplished without                            difficulty. The patient tolerated the procedure                            well. Scope In: 8:53:50 AM Scope Out: 8:58:07 AM Total Procedure Duration: 0 hours 4 minutes 17 seconds  Findings:      The examined esophagus was normal. Patchy erythema, superficial gastric       erosions involving the body and antrum. No ulcer or infiltrating process       seen. Pylorus patent.      The duodenal bulb and second portion of the duodenum were normal.       Biopsies the abnormal gastric antrum and body taken for histologic study. Impression:               - Normal esophagus. Abnormal gastric mucosa of                            uncertain significance?"status post biopsy                           - Normal duodenal bulb and second portion of the  duodenum. Moderate Sedation:      Moderate (conscious) sedation was personally administered by an       anesthesia professional. The following parameters were monitored: oxygen       saturation, heart rate, blood pressure, respiratory rate, EKG, adequacy       of pulmonary ventilation, and response to care. Recommendation:           - Patient has a contact number available for                            emergencies. The signs and symptoms of potential                            delayed complications were discussed with the                            patient. Return to normal activities tomorrow.                            Written discharge instructions were provided to the                             patient.                           - Advance diet as tolerated. Continue present                            medications. Follow-up on pathology. See                            colonoscopy report. Procedure Code(s):        --- Professional ---                           [phone], Esophagogastroduodenoscopy, flexible,                            transoral; diagnostic, including collection of                            specimen(s) by brushing or washing, when performed                            (separate procedure) Diagnosis Code(s):        --- Professional ---                           R10.13, Epigastric pain                           R63.0, Anorexia                           R63.4, Abnormal weight loss CPT copyright 2019 American Medical Association. All rights reserved. The codes documented in this report are preliminary and upon coder review may  be revised to meet current compliance requirements. Herbie Baltimore  Hilton Cork, MD Norvel Richards, MD 03/08/2022 9:02:56 AM This report has been signed electronically. Number of Addenda: 0

## 2022-03-08 NOTE — Transfer of Care (Signed)
Immediate Anesthesia Transfer of Care Note  Patient: Patrick Rubio  Procedure(s) Performed: COLONOSCOPY WITH PROPOFOL ESOPHAGOGASTRODUODENOSCOPY (EGD) WITH PROPOFOL BIOPSY  Patient Location: Short Stay  Anesthesia Type:General  Level of Consciousness: sedated  Airway & Oxygen Therapy: Patient Spontanous Breathing  Post-op Assessment: Report given to RN and Post -op Vital signs reviewed and stable  Post vital signs: Reviewed and stable  Last Vitals:  Vitals Value Taken Time  BP 90/50   Temp 36   Pulse 56   Resp 16   SpO2 94     Last Pain:  Vitals:   03/08/22 0849  TempSrc:   PainSc: 6          Complications: No notable events documented.

## 2022-03-08 NOTE — Anesthesia Postprocedure Evaluation (Signed)
Anesthesia Post Note  Patient: Patrick Rubio  Procedure(s) Performed: COLONOSCOPY WITH PROPOFOL ESOPHAGOGASTRODUODENOSCOPY (EGD) WITH PROPOFOL BIOPSY  Patient location during evaluation: Phase II Anesthesia Type: General Level of consciousness: awake and alert and oriented Pain management: pain level controlled Vital Signs Assessment: post-procedure vital signs reviewed and stable Respiratory status: spontaneous breathing, nonlabored ventilation and respiratory function stable Cardiovascular status: blood pressure returned to baseline and stable Postop Assessment: no apparent nausea or vomiting Anesthetic complications: no   No notable events documented.   Last Vitals:  Vitals:   03/08/22 0717 03/08/22 0924  BP: 110/86 102/86  Pulse: 87 71  Resp: 16 (!) 25  Temp: 36.6 C (!) 36.3 C  SpO2: 99% 98%    Last Pain:  Vitals:   03/08/22 0924  TempSrc: Axillary  PainSc: 0-No pain                 Tove Wideman C Toniann Dickerson

## 2022-03-09 LAB — SURGICAL PATHOLOGY

## 2022-03-11 ENCOUNTER — Encounter: Payer: Self-pay | Admitting: Internal Medicine

## 2022-03-13 ENCOUNTER — Encounter (HOSPITAL_COMMUNITY): Payer: Self-pay | Admitting: Internal Medicine

## 2022-04-17 ENCOUNTER — Encounter: Payer: Self-pay | Admitting: Gastroenterology

## 2022-04-17 ENCOUNTER — Ambulatory Visit (INDEPENDENT_AMBULATORY_CARE_PROVIDER_SITE_OTHER): Payer: 59 | Admitting: Gastroenterology

## 2022-04-17 VITALS — BP 103/66 | HR 78 | Temp 97.3°F | Ht 70.0 in | Wt 194.2 lb

## 2022-04-17 DIAGNOSIS — K529 Noninfective gastroenteritis and colitis, unspecified: Secondary | ICD-10-CM

## 2022-04-17 DIAGNOSIS — R1013 Epigastric pain: Secondary | ICD-10-CM

## 2022-04-17 NOTE — Patient Instructions (Signed)
Collect stool test and complete blood work. If you do not hear from use in 3-5 business days, call to check and make sure we received your results.

## 2022-04-17 NOTE — Progress Notes (Signed)
GI Office Note    Referring Provider: Frances Maywood, FNP Primary Care Physician:  Frances Maywood, FNP  Primary Gastroenterologist: Garfield Cornea, MD   Chief Complaint   Chief Complaint  Patient presents with   Follow-up    Still the same, states that some days are better than others.     History of Present Illness   Patrick Rubio is a 61 y.o. male presenting today for follow-up.  He was last seen in July 2023 for hospital follow-up for acute on chronic diarrhea, rectal bleeding.  While hospitalized, stool studies were positive for Salmonella.  He developed C. difficile requiring vancomycin after this hospitalization.  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  Cholecystectomy in 2020.  Patient presents today stating that his chronic diarrhea is unchanged.  He rarely has a solid stool.  Sometimes the stool tries to take shape but the best he has is occasional [place] 5.  Never has constipation.  States he had nabs clear today and this caused diarrhea.  Seems to have postprandial component.  Broccoli causes diarrhea.  Denies melena or rectal bleeding.  Occasionally has a day where he only has 1 BM but mostly 3-4.  Sometimes he develops flares of epigastric/left upper quadrant pain which can be unbearable.  He has been on pantoprazole chronically.  Celebrex was started about 9 months ago.  No issues with heartburn or vomiting.   Colonoscopy August 2023: Entire examined colon normal.  Distal 10 cm of the terminal ileum appeared normal. No biopsies obtained.  Repeat colonoscopy in 10 years.  EGD August 2023: Abnormal gastric mucosa, patchy erythema and superficial gastric erosions involving the body and the antrum.  Biopsy showed reactive gastropathy with minimal chronic gastritis.  Negative for H. pylori.  CT abdomen and pelvis with contrast April 2023: Tiny fat-containing umbilical hernia.   Associated trace right stranding correlate clinically for incarceration.  No obstruction.   Right upper quadrant ultrasound April 2023: Prior cholecystectomy.  CBD 4.7 mm.   Medications   Current Outpatient Medications  Medication Sig Dispense Refill   acetaminophen (TYLENOL) 650 MG CR tablet Take 650 mg by mouth in the morning and at bedtime.     aspirin EC 81 MG tablet Take 81 mg by mouth every Monday, Wednesday, and Friday. IN THE MORNING     baclofen (LIORESAL) 10 MG tablet Take 10 mg by mouth 2 (two) times daily.     carvedilol (COREG) 6.25 MG tablet Take 6.25 mg by mouth 2 (two) times daily.     celecoxib (CELEBREX) 200 MG capsule Take 200 mg by mouth in the morning and at bedtime.     cholecalciferol (VITAMIN D3) 25 MCG (1000 UNIT) tablet Take 1,000 Units by mouth in the morning and at bedtime.     escitalopram (LEXAPRO) 20 MG tablet Take 20 mg by mouth in the morning.     gabapentin (NEURONTIN) 300 MG capsule Take 300 mg by mouth 2 (two) times daily.     nitroGLYCERIN (NITROSTAT) 0.4 MG SL tablet Place 0.4 mg under the tongue every 5 (five) minutes x 3 doses as needed for chest pain.     pantoprazole (PROTONIX) 40 MG tablet TAKE 1 TABLET BY MOUTH EVERY DAY 90 tablet 3   Pitavastatin Calcium (LIVALO) 4 MG TABS Take 4 mg by mouth every evening.     prasugrel (EFFIENT) 10 MG TABS tablet Take 10 mg by mouth  in the morning.     sacubitril-valsartan (ENTRESTO) 24-26 MG Take 1 tablet by mouth 2 (two) times daily.     No current facility-administered medications for this visit.    Allergies   Allergies as of 04/17/2022   (No Known Allergies)     Review of Systems   General: Negative for anorexia, weight loss, fever, chills, fatigue, weakness. ENT: Negative for hoarseness, difficulty swallowing , nasal congestion. CV: Negative for chest pain, angina, palpitations, dyspnea on exertion, peripheral edema.  Respiratory: Negative for dyspnea at rest, dyspnea on exertion, cough, sputum,  wheezing.  GI: See history of present illness. GU:  Negative for dysuria, hematuria, urinary incontinence, urinary frequency, nocturnal urination.  Endo: Negative for unusual weight change.     Physical Exam   BP 103/66 (BP Location: Left Arm, Patient Position: Sitting, Cuff Size: Normal)   Pulse 78   Temp (!) 97.3 F (36.3 C) (Temporal)   Ht '5\' 10"'$  (1.778 m)   Wt 194 lb 3.2 oz (88.1 kg)   SpO2 98%   BMI 27.86 kg/m    General: Well-nourished, well-developed in no acute distress.  Eyes: No icterus. Mouth: Oropharyngeal mucosa moist and pink , no lesions erythema or exudate. Abdomen: Bowel sounds are normal, nontender, nondistended, no hepatosplenomegaly or masses,  no abdominal bruits or hernia , no rebound or guarding.  Rectal: not performed  Extremities: No lower extremity edema. No clubbing or deformities. Neuro: Alert and oriented x 4   Skin: Warm and dry, no jaundice.   Psych: Alert and cooperative, normal mood and affect.  Labs   Lab Results  Component Value Date   CREATININE 0.67 02/12/2022   BUN 13 02/12/2022   NA 139 02/12/2022   K 3.8 02/12/2022   CL 107 02/12/2022   CO2 26 02/12/2022   Lab Results  Component Value Date   ALT 11 02/12/2022   AST 16 02/12/2022   ALKPHOS 56 02/12/2022   BILITOT 1.0 02/12/2022   Lab Results  Component Value Date   WBC 5.6 02/12/2022   HGB 13.1 02/12/2022   HCT 38.2 (L) 02/12/2022   MCV 91.6 02/12/2022   PLT 169 02/12/2022   Lab Results  Component Value Date   LIPASE 25 11/19/2021   Lab Results  Component Value Date   IRON 23 (L) 11/20/2021   TIBC 310 11/20/2021   FERRITIN 53 11/20/2021    Imaging Studies   No results found.  Assessment   Chronic diarrhea: No evidence of IBD on colonoscopy.  Biopsies were not obtained to rule out microscopic colitis.  He had chronic diarrhea prior to onset of Salmonella and C. difficile earlier this year.  Rule out celiac disease.  Check for EPI.  Epigastric pain: EGD  up-to-date.  CT completed in April.  Etiology of abdominal pain is not clear.   PLAN   Continue pantoprazole 40 mg daily especially since he is on chronic Celebrex. Stool for pancreatic elastase. Labs to screen for celiac, lipase, LFTs.  Laureen Ochs. Bobby Rumpf, [place], [facility]

## 2022-04-20 ENCOUNTER — Encounter: Payer: Self-pay | Admitting: Gastroenterology

## 2022-04-27 LAB — HEPATIC FUNCTION PANEL
ALT: 11 IU/L (ref 0–44)
AST: 13 IU/L (ref 0–40)
Albumin: 4 g/dL (ref 3.9–4.9)
Alkaline Phosphatase: 63 IU/L (ref 44–121)
Bilirubin Total: 0.8 mg/dL (ref 0.0–1.2)
Bilirubin, Direct: 0.19 mg/dL (ref 0.00–0.40)
Total Protein: 6.2 g/dL (ref 6.0–8.5)

## 2022-04-27 LAB — LIPASE: Lipase: 45 U/L (ref 13–78)

## 2022-04-27 LAB — IGA: IgA/Immunoglobulin A, Serum: 114 mg/dL (ref 61–437)

## 2022-04-27 LAB — TISSUE TRANSGLUTAMINASE, IGA: Transglutaminase IgA: <2 U/mL (ref 0–3)

## 2022-05-01 LAB — PANCREATIC ELASTASE, FECAL: Pancreatic Elastase, Fecal: 270 ug Elast./g (ref >200)

## 2022-06-08 ENCOUNTER — Other Ambulatory Visit: Payer: Self-pay | Admitting: Gastroenterology

## 2022-06-08 MED ORDER — DICYCLOMINE HCL 10 MG PO CAPS: 10.0000 mg | ORAL_CAPSULE | Freq: Two times a day (BID) | ORAL | 1 refills | Status: DC | PRN

## 2022-07-10 ENCOUNTER — Other Ambulatory Visit: Payer: Self-pay | Admitting: Gastroenterology

## 2022-08-29 ENCOUNTER — Inpatient Hospital Stay: Payer: 59

## 2022-08-29 ENCOUNTER — Encounter: Payer: Self-pay | Admitting: Hematology

## 2022-08-29 ENCOUNTER — Inpatient Hospital Stay: Payer: 59 | Attending: Hematology | Admitting: Hematology

## 2022-08-29 VITALS — BP 136/85 | HR 56 | Temp 97.8°F | Resp 18 | Wt 197.1 lb

## 2022-08-29 DIAGNOSIS — C9110 Chronic lymphocytic leukemia of B-cell type not having achieved remission: Secondary | ICD-10-CM | POA: Insufficient documentation

## 2022-08-29 DIAGNOSIS — C8300 Small cell B-cell lymphoma, unspecified site: Secondary | ICD-10-CM

## 2022-08-29 DIAGNOSIS — I2510 Atherosclerotic heart disease of native coronary artery without angina pectoris: Secondary | ICD-10-CM | POA: Insufficient documentation

## 2022-08-29 DIAGNOSIS — F17210 Nicotine dependence, cigarettes, uncomplicated: Secondary | ICD-10-CM | POA: Insufficient documentation

## 2022-08-29 DIAGNOSIS — Z79899 Other long term (current) drug therapy: Secondary | ICD-10-CM | POA: Diagnosis not present

## 2022-08-29 LAB — COMPREHENSIVE METABOLIC PANEL
ALT: 12 U/L (ref 0–44)
AST: 16 U/L (ref 15–41)
Albumin: 3.8 g/dL (ref 3.5–5.0)
Alkaline Phosphatase: 60 U/L (ref 38–126)
Anion gap: 7 (ref 5–15)
BUN: 9 mg/dL (ref 8–23)
CO2: 25 mmol/L (ref 22–32)
Calcium: 8.8 mg/dL — ABNORMAL LOW (ref 8.9–10.3)
Chloride: 106 mmol/L (ref 98–111)
Creatinine, Ser: 0.69 mg/dL (ref 0.61–1.24)
GFR, Estimated: >60 mL/min (ref >60)
Glucose, Bld: 84 mg/dL (ref 70–99)
Potassium: 4 mmol/L (ref 3.5–5.1)
Sodium: 138 mmol/L (ref 135–145)
Total Bilirubin: 0.8 mg/dL (ref 0.3–1.2)
Total Protein: 6.6 g/dL (ref 6.5–8.1)

## 2022-08-29 LAB — CBC WITH DIFFERENTIAL/PLATELET
Abs Immature Granulocytes: 0.01 10*3/uL (ref 0.00–0.07)
Basophils Absolute: 0.1 10*3/uL (ref 0.0–0.1)
Basophils Relative: 1 %
Eosinophils Absolute: 0.1 10*3/uL (ref 0.0–0.5)
Eosinophils Relative: 1 %
HCT: 41.2 % (ref 39.0–52.0)
Hemoglobin: 13.7 g/dL (ref 13.0–17.0)
Immature Granulocytes: 0 %
Lymphocytes Relative: 32 %
Lymphs Abs: 2.8 10*3/uL (ref 0.7–4.0)
MCH: 30.5 pg (ref 26.0–34.0)
MCHC: 33.3 g/dL (ref 30.0–36.0)
MCV: 91.8 fL (ref 80.0–100.0)
Monocytes Absolute: 0.6 10*3/uL (ref 0.1–1.0)
Monocytes Relative: 7 %
Neutro Abs: 5.3 10*3/uL (ref 1.7–7.7)
Neutrophils Relative %: 59 %
Platelets: 173 10*3/uL (ref 150–400)
RBC: 4.49 MIL/uL (ref 4.22–5.81)
RDW: 12.5 % (ref 11.5–15.5)
WBC: 8.8 10*3/uL (ref 4.0–10.5)
nRBC: 0 % (ref 0.0–0.2)

## 2022-08-29 LAB — LACTATE DEHYDROGENASE: LDH: 148 U/L (ref 98–192)

## 2022-08-29 NOTE — Progress Notes (Signed)
[place], [place] [address]   CLINIC:  Medical Oncology/Hematology  PCP:  Patrick Maywood, FNP [address] [place] [address] [phone]   REASON FOR VISIT:  Follow-up for small lymphocytic lymphoma  PRIOR THERAPY:  1. Obinutuzumab & venetoclax x 6 cycles from 06/09/2019 to 10/27/2019. 2. Venetoclax 200 mg through 06/2020.  NGS Results: not done  CURRENT THERAPY: surveillance  BRIEF ONCOLOGIC HISTORY:  Oncology History  Small lymphocytic lymphoma ([place])  05/04/2019 Initial Diagnosis   Small lymphocytic lymphoma ([place])   06/09/2019 - 10/27/2019 Chemotherapy   Patient is on Treatment Plan : LEUKEMIA CLL Obinutuzumab q28d       CANCER STAGING:  Cancer Staging  No matching staging information was found for the patient.  INTERVAL HISTORY:  [name], a 62 y.o. male, returns for routine follow-up of his small lymphocytic lymphoma. He was last seen by me on 02/27/22.  Today, he states that he is doing well overall. He continues to have some mild to moderate LUQ discomfort. His appetite level is at 100%. His energy level is at 85%. He states that he was hospitalized in 10/2021 for persistent gastritis and rectal bleeding. He had a colonoscopy and endoscopy in 02/2022 with Dr. Manus Rubio which were both normal.  He denies any recent illnesses, fever, chills, N/V/D, or night sweats.   REVIEW OF SYSTEMS:  Review of Systems  Constitutional:  Positive for fatigue. Negative for chills and fever.  HENT:   Negative for lump/mass, mouth sores, nosebleeds, sore throat and trouble swallowing.   Respiratory:  Negative for cough and shortness of breath.   Cardiovascular:  Negative for chest pain, leg swelling and palpitations.  Gastrointestinal:  Positive for abdominal pain and diarrhea. Negative for constipation, nausea and vomiting.  Genitourinary:  Negative for bladder incontinence, difficulty urinating, dysuria, frequency,  hematuria and nocturia.   Musculoskeletal:  Negative for arthralgias, back pain, flank pain, myalgias and neck pain.  Skin:  Negative for itching and rash.  Neurological:  Negative for dizziness, headaches and numbness.  Hematological:  Does not bruise/bleed easily.  Psychiatric/Behavioral:  Negative for depression, sleep disturbance and suicidal ideas. The patient is not nervous/anxious.   All other systems reviewed and are negative.   PAST MEDICAL/SURGICAL HISTORY:  Past Medical History:  Diagnosis Date   CAD (coronary artery disease)    GERD (gastroesophageal reflux disease)    HTN (hypertension)    Hypercholesterolemia    MI (myocardial infarction) ([place]) 2003   Small lymphocytic lymphoma ([place])    Stomach cancer ([place])    Past Surgical History:  Procedure Laterality Date   AXILLARY LYMPH NODE BIOPSY Left 04/27/2019   Procedure: AXILLARY LYMPH NODE BIOPSY;  Surgeon: Aviva Signs, MD;  Location: [facility];  Service: General;  Laterality: Left;   BIOPSY  11/26/2018   Procedure: BIOPSY;  Surgeon: Daneil Dolin, MD;  Location: [facility];  Service: Endoscopy;;   BIOPSY  03/08/2022   Procedure: BIOPSY;  Surgeon: Daneil Dolin, MD;  Location: [facility];  Service: Endoscopy;;   CHOLECYSTECTOMY N/A 05/18/2019   Procedure: LAPAROSCOPIC CHOLECYSTECTOMY;  Surgeon: Aviva Signs, MD;  Location: [facility];  Service: General;  Laterality: N/A;   COLONOSCOPY WITH PROPOFOL N/A 03/08/2022   Procedure: COLONOSCOPY WITH PROPOFOL;  Surgeon: Daneil Dolin, MD;  Location: [facility];  Service: Endoscopy;  Laterality: N/A;  8:45am   CORONARY ANGIOPLASTY WITH STENT PLACEMENT     2003  ESOPHAGOGASTRODUODENOSCOPY N/A 11/26/2018   mild erosive reflux esophagitis, gastric erythema. Negative H.pylori    ESOPHAGOGASTRODUODENOSCOPY (EGD) WITH PROPOFOL N/A 03/08/2022   Procedure: ESOPHAGOGASTRODUODENOSCOPY (EGD) WITH PROPOFOL;  Surgeon: Daneil Dolin, MD;  [facility];  Service: Endoscopy;   Laterality: N/A;   INGUINAL HERNIA REPAIR Right 04/27/2019   Procedure: HERNIA REPAIR INGUINAL ADULT;  Surgeon: Aviva Signs, MD;  Location: [facility];  Service: General;  Laterality: Right;   KNEE SURGERY Right    lumbar fusion with cage  07/2017   NASAL SINUS SURGERY     TONSILLECTOMY AND ADENOIDECTOMY      SOCIAL HISTORY:  Social History   Socioeconomic History   Marital status: Divorced    Spouse name: Not on file   Number of children: 2   Years of education: Not on file   Highest education level: Not on file  Occupational History   Occupation: employed    Comment: part time at [employer] home  Tobacco Use   Smoking status: Every Day    Packs/day: 0.50    Years: 25.00    Total pack years: 12.50    Types: Cigarettes   Smokeless tobacco: Never  Vaping Use   Vaping Use: Never used  Substance and Sexual Activity   Alcohol use: Yes    Comment: occ   Drug use: Never   Sexual activity: Yes  Other Topics Concern   Not on file  Social History Narrative   Not on file   Social Determinants of Health   Financial Resource Strain: Low Risk  (06/22/2020)   Overall Financial Resource Strain (CARDIA)    Difficulty of Paying Living Expenses: Not hard at all  Food Insecurity: No Food Insecurity (06/22/2020)   Hunger Vital Sign    Worried About Running Out of Food in the Last Year: Never true    [place] in the Last Year: Never true  Transportation Needs: No Transportation Needs (06/22/2020)   PRAPARE - Hydrologist (Medical): No    Lack of Transportation (Non-Medical): No  Physical Activity: Inactive (06/22/2020)   Exercise Vital Sign    Days of Exercise per Week: 0 days    Minutes of Exercise per Session: 0 min  Stress: No Stress Concern Present (06/22/2020)   [place]    Feeling of Stress : Not at all  Social Connections: Socially Isolated (06/22/2020)   Social Connection  and Isolation Panel [NHANES]    Frequency of Communication with Friends and Family: More than three times a week    Frequency of Social Gatherings with Friends and Family: More than three times a week    Attends Religious Services: Never    Marine scientist or Organizations: No    Attends Archivist Meetings: Never    Marital Status: Divorced  Human resources officer Violence: Not At Risk (06/22/2020)   Humiliation, Afraid, Rape, and Kick questionnaire    Fear of Current or Ex-Partner: No    Emotionally Abused: No    Physically Abused: No    Sexually Abused: No    FAMILY HISTORY:  Family History  Problem Relation Age of Onset   Colon polyps Father        thinks he may have had polyps, possibly in his 24s.    Heart attack Father    Dementia Mother    Hypotension Mother    Stroke Sister    Diabetes Maternal  Grandmother    Stroke Maternal Grandfather    Cancer Paternal Grandmother    Congestive Heart Failure Paternal Grandfather    Kidney cancer Daughter    Colon cancer Neg Hx    Pancreatic cancer Neg Hx    Liver disease Neg Hx     CURRENT MEDICATIONS:  Current Outpatient Medications  Medication Sig Dispense Refill   acetaminophen (TYLENOL) 650 MG CR tablet Take 650 mg by mouth in the morning and at bedtime.     aspirin EC 81 MG tablet Take 81 mg by mouth every Monday, Wednesday, and Friday. IN THE MORNING     baclofen (LIORESAL) 10 MG tablet Take 10 mg by mouth 2 (two) times daily.     carvedilol (COREG) 6.25 MG tablet Take 6.25 mg by mouth 2 (two) times daily.     celecoxib (CELEBREX) 200 MG capsule Take 200 mg by mouth in the morning and at bedtime.     cholecalciferol (VITAMIN D3) 25 MCG (1000 UNIT) tablet Take 1,000 Units by mouth in the morning and at bedtime.     dicyclomine (BENTYL) 10 MG capsule TAKE 1 CAPSULE (10 MG TOTAL) BY MOUTH 2 (TWO) TIMES DAILY AS NEEDED FOR SPASMS (DIARRHEA). 180 capsule 1   ENTRESTO 49-51 MG Take 1 tablet by mouth 2 (two) times  daily.     escitalopram (LEXAPRO) 20 MG tablet Take 20 mg by mouth in the morning.     gabapentin (NEURONTIN) 300 MG capsule Take 300 mg by mouth 2 (two) times daily.     nitroGLYCERIN (NITROSTAT) 0.4 MG SL tablet Place 0.4 mg under the tongue every 5 (five) minutes x 3 doses as needed for chest pain.     pantoprazole (PROTONIX) 40 MG tablet TAKE 1 TABLET BY MOUTH EVERY DAY 90 tablet 3   Pitavastatin Calcium (LIVALO) 4 MG TABS Take 4 mg by mouth every evening.     prasugrel (EFFIENT) 10 MG TABS tablet Take 10 mg by mouth in the morning.     No current facility-administered medications for this visit.    ALLERGIES:  No Known Allergies  PHYSICAL EXAM:  Performance status (ECOG): 1 - Symptomatic but completely ambulatory  Vitals:   08/29/22 1123  BP: 136/85  Pulse: (!) 56  Resp: 18  Temp: 97.8 F (36.6 C)  SpO2: 98%   Wt Readings from Last 3 Encounters:  08/29/22 89.4 kg (197 lb 1.5 oz)  04/17/22 88.1 kg (194 lb 3.2 oz)  03/02/22 87 kg (191 lb 11.2 oz)   Physical Exam Vitals and nursing note reviewed. Exam conducted with a chaperone present.  Constitutional:      Appearance: Normal appearance.  Cardiovascular:     Rate and Rhythm: Normal rate and regular rhythm.     Pulses: Normal pulses.     Heart sounds: Normal heart sounds.  Pulmonary:     Effort: Pulmonary effort is normal.     Breath sounds: Normal breath sounds.  Abdominal:     Palpations: Abdomen is soft. There is no hepatomegaly, splenomegaly or mass.     Tenderness: There is no abdominal tenderness.  Lymphadenopathy:     Cervical: No cervical adenopathy.     Right cervical: No superficial cervical adenopathy.    Left cervical: No superficial cervical adenopathy.     Upper Body:     Right upper body: No supraclavicular or axillary adenopathy.     Left upper body: No supraclavicular or axillary adenopathy.     Lower Body: No right  inguinal adenopathy. No left inguinal adenopathy.  Neurological:     General:  No focal deficit present.     Mental Status: He is alert and oriented to person, place, and time.  Psychiatric:        Mood and Affect: Mood normal.        Behavior: Behavior normal.      LABORATORY DATA:  I have reviewed the labs as listed.     Latest Ref Rng & Units 08/29/2022   10:54 AM 02/12/2022    9:01 AM 11/20/2021    4:36 AM  CBC  WBC 4.0 - 10.5 K/uL 8.8  5.6  3.7   Hemoglobin 13.0 - 17.0 g/dL 13.7  13.1  13.5   Hematocrit 39.0 - 52.0 % 41.2  38.2  40.1   Platelets 150 - 400 K/uL 173  169  134       Latest Ref Rng & Units 08/29/2022   10:54 AM 04/24/2022    8:19 AM 02/12/2022    9:01 AM  CMP  Glucose 70 - 99 mg/dL 84   80   BUN 8 - 23 mg/dL 9   13   Creatinine 0.61 - 1.24 mg/dL 0.69   0.67   Sodium 135 - 145 mmol/L 138   139   Potassium 3.5 - 5.1 mmol/L 4.0   3.8   Chloride 98 - 111 mmol/L 106   107   CO2 22 - 32 mmol/L 25   26   Calcium 8.9 - 10.3 mg/dL 8.8   8.9   Total Protein 6.5 - 8.1 g/dL 6.6  6.2  6.3   Total Bilirubin 0.3 - 1.2 mg/dL 0.8  0.8  1.0   Alkaline Phos 38 - 126 U/L 60  63  56   AST 15 - 41 U/L '16  13  16   '$ ALT 0 - 44 U/L '12  11  11     '$ DIAGNOSTIC IMAGING:  I have independently reviewed the scans and discussed with the patient. No results found.   ASSESSMENT:  1.  Clinical stage IIIb small lymphocytic lymphoma: -6 cycles of obinutuzumab and venetoclax from 06/09/2019 through 10/27/2019. -PET scan on 11/23/2019 showed complete metabolic response to therapy of lymphoma.  Slight hypermetabolism in the right inguinal region from hernia surgery.  New hypermetabolic soft tissue fullness about the ischial tuberosity, possibly related to hamstring origin injury. -Venetoclax was dose reduced to 200 mg daily secondary to carvedilol. -PET scan on June 20, 2020 with no findings suspicious for active lymphoma. -Venetoclax discontinued in December 2021. - PET scan on 06/20/2020 did not show any evidence of lymphoma. - MRD flow cytometry on 06/13/2020  showed extremely small abnormal B-cell population, less than 0.01% of total cells.   2.  CAD: -Cardiac catheterization on 10/01/2019 found to have 100% occlusion of the RCA. -Angioplasty and stenting of the circumflex was done. -He was started on Effient and Livalo. -Echocardiogram on 01/18/2020 by Dr. Sabra Heck in [place] LVEF 50-55% with mild global hypokinesia, moderate LVH, normal RV function, stage I diastolic dysfunction.   3.  Right posterior hip pain: -MRI of the pelvis without contrast on 01/10/2020 shows complete tear of the right conjoined semitendinosus and biceps femoris tendon at the ischial tuberosity with 2.2 cm retraction.  High-grade partial tear of the right semimembranosus tendon at the ischial tuberosity. -He was evaluated by Dr. Mardelle Matte and conservative management recommended.   PLAN:  1.  Clinical stage IIIb small lymphocytic lymphoma: - Denies fevers, night  sweats or weight loss in the last 4 months. - Physical examination today did not reveal any palpable adenopathy or splenomegaly. - Labs today shows normal LFTs, LDH and CBC.  Differential was normal. - Reviewed his recent EGD and colonoscopy from 03/08/2022 which showed minimal chronic gastritis. - I have recommended follow-up in 6 months with labs on the same day.       Orders placed this encounter:  Orders Placed This Encounter  Procedures   CBC with Differential   Comprehensive metabolic panel   Lactate dehydrogenase    I,Alexis Herring,acting as a scribe for Derek Jack, MD.,have documented all relevant documentation on the behalf of Derek Jack, MD,as directed by  Derek Jack, MD while in the presence of Derek Jack, MD.  I, Derek Jack MD, have reviewed the above documentation for accuracy and completeness, and I agree with the above.    Derek Jack, MD [place] [phone]

## 2022-08-29 NOTE — Patient Instructions (Addendum)
[place] at [facility] Discharge Instructions   You were seen and examined today by Dr. Delton Coombes.  He reviewed the results of your lab work which are normal.   We will see you back in 6 months. We will repeat lab work at that time.     Thank you for choosing [place] at [facility] to provide your oncology and hematology care.  To afford each patient quality time with our provider, please arrive at least 15 minutes before your scheduled appointment time.   If you have a lab appointment with the [facility] please come in thru the Main Entrance and check in at the main information desk.  You need to re-schedule your appointment should you arrive 10 or more minutes late.  We strive to give you quality time with our providers, and arriving late affects you and other patients whose appointments are after yours.  Also, if you no show three or more times for appointments you may be dismissed from the clinic at the providers discretion.     Again, thank you for choosing [facility].  Our hope is that these requests will decrease the amount of time that you wait before being seen by our physicians.       _____________________________________________________________  Should you have questions after your visit to [facility], please contact our office at [phone] and follow the prompts.  Our office hours are 8:00 a.m. and 4:30 p.m. Monday - Friday.  Please note that voicemails left after 4:00 p.m. may not be returned until the following business day.  We are closed weekends and major holidays.  You do have access to a nurse 24-7, just call the main number to the clinic [phone] and do not press any options, hold on the line and a nurse will answer the phone.    For prescription refill requests, have your pharmacy contact our office and allow 72 hours.    Due to Covid, you will need to wear a mask upon entering  the hospital. If you do not have a mask, a mask will be given to you at the Main Entrance upon arrival. For doctor visits, patients may have 1 support person age 20 or older with them. For treatment visits, patients can not have anyone with them due to social distancing guidelines and our immunocompromised population.

## 2022-09-27 ENCOUNTER — Encounter: Payer: Self-pay | Admitting: Radiology

## 2022-10-23 ENCOUNTER — Other Ambulatory Visit: Payer: Self-pay | Admitting: Gastroenterology

## 2022-10-23 DIAGNOSIS — K8020 Calculus of gallbladder without cholecystitis without obstruction: Secondary | ICD-10-CM

## 2022-10-23 DIAGNOSIS — R634 Abnormal weight loss: Secondary | ICD-10-CM

## 2022-10-23 DIAGNOSIS — R197 Diarrhea, unspecified: Secondary | ICD-10-CM

## 2022-11-13 IMAGING — US US ABDOMEN LIMITED
1 series · 14 of 25 positions shown · non-contrast
Comparison: None.

CLINICAL DATA: Rectal bleed for 1 day.

EXAM:
ULTRASOUND ABDOMEN LIMITED RIGHT UPPER QUADRANT

[Series 1: us abdomen limited ruq (liver/gb) · 14 of 35 slices shown]
[im 1/35]
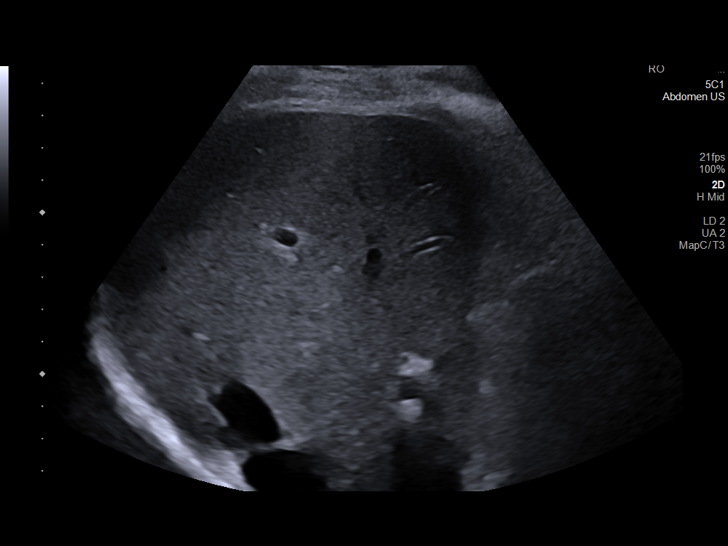
[im 3/35]
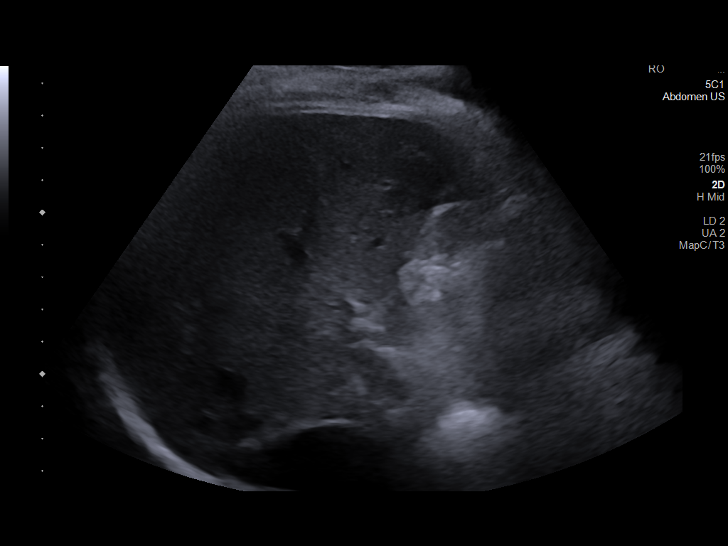
[im 6/35]
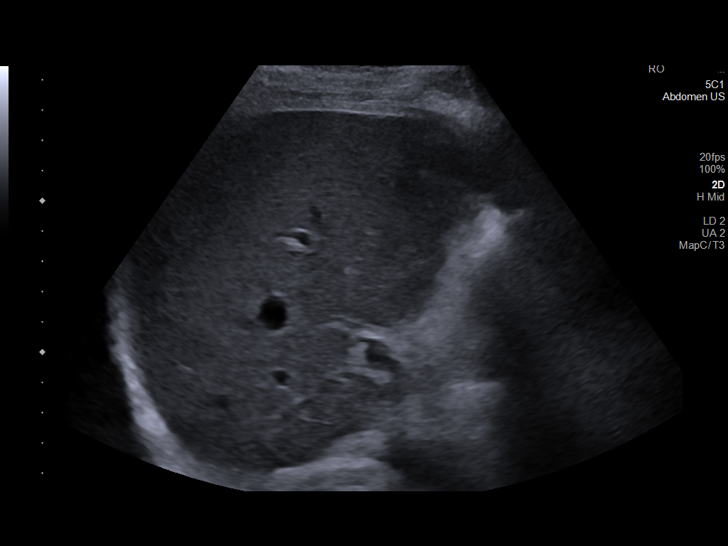
[im 9/35]
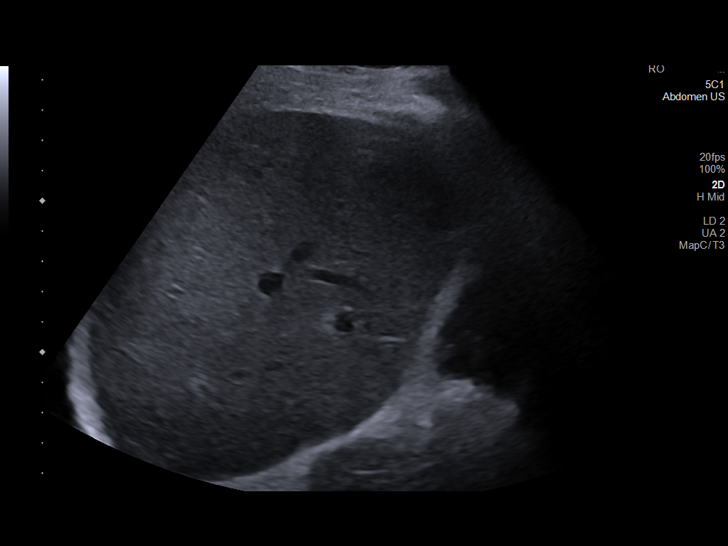
[im 12/35]
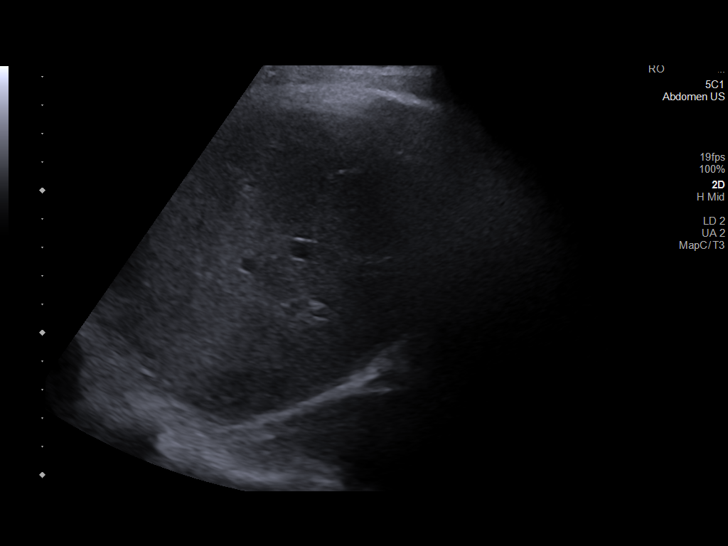
[im 13/35]
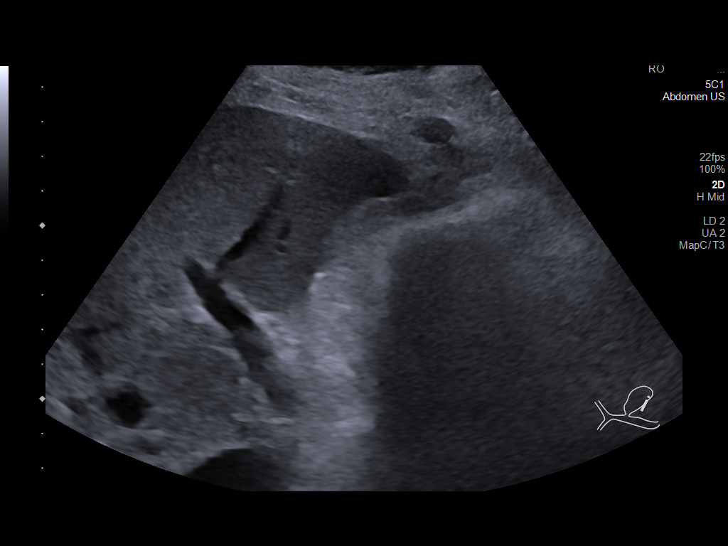
[im 16/35]
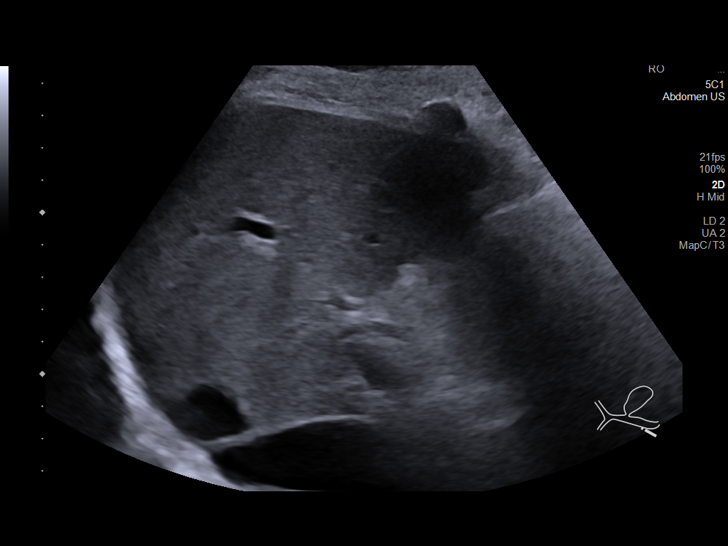
[im 19/35]
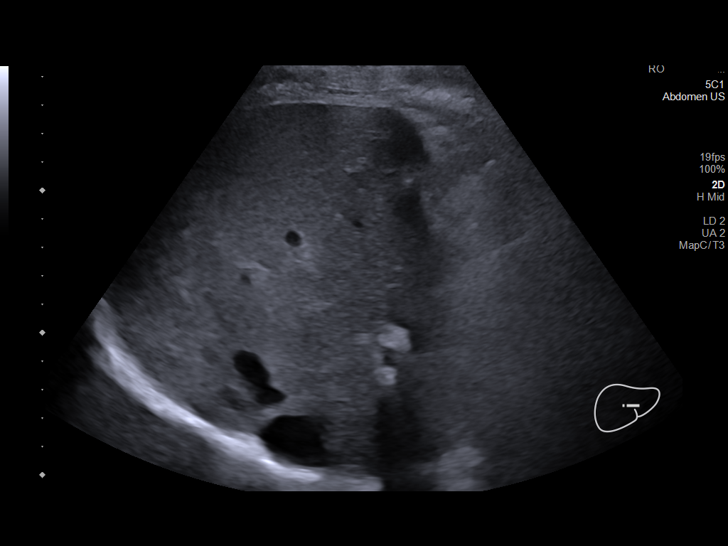
[im 22/35]
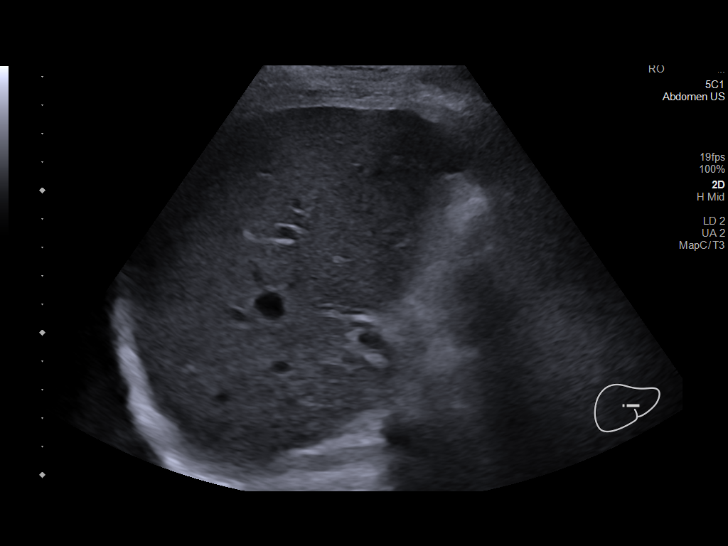
[im 23/35]
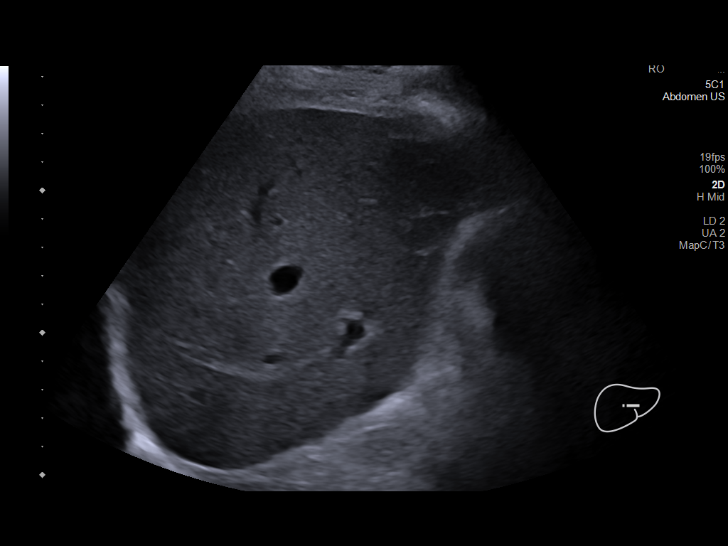
[im 26/35]
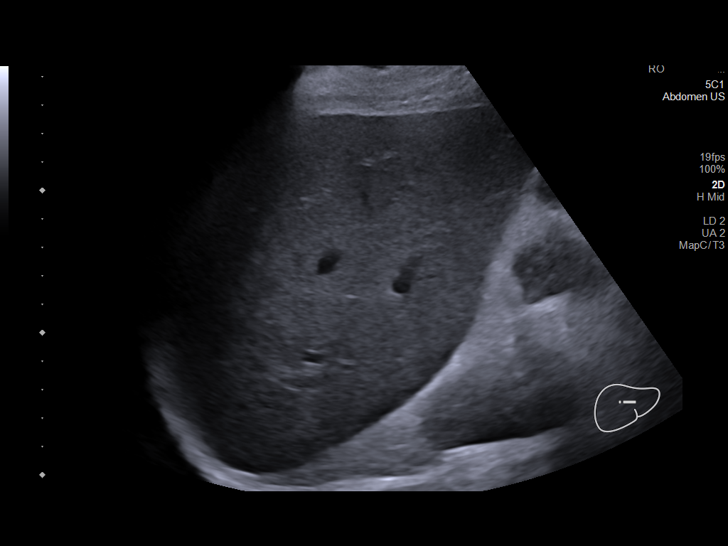
[im 29/35]
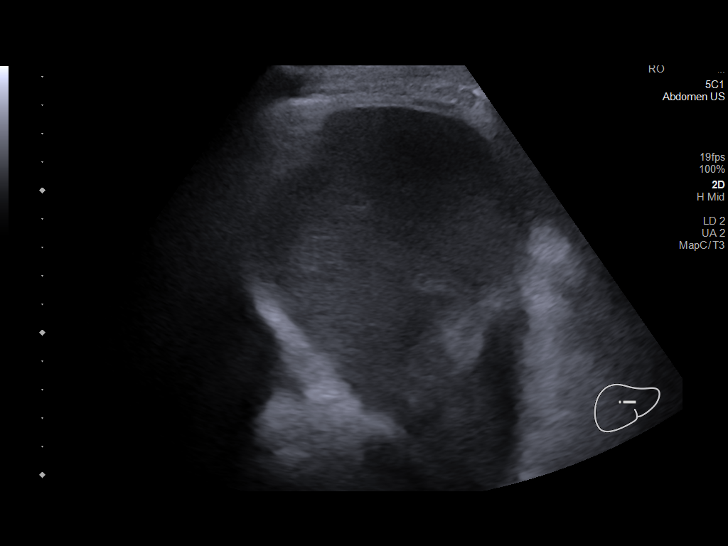
[im 32/35]
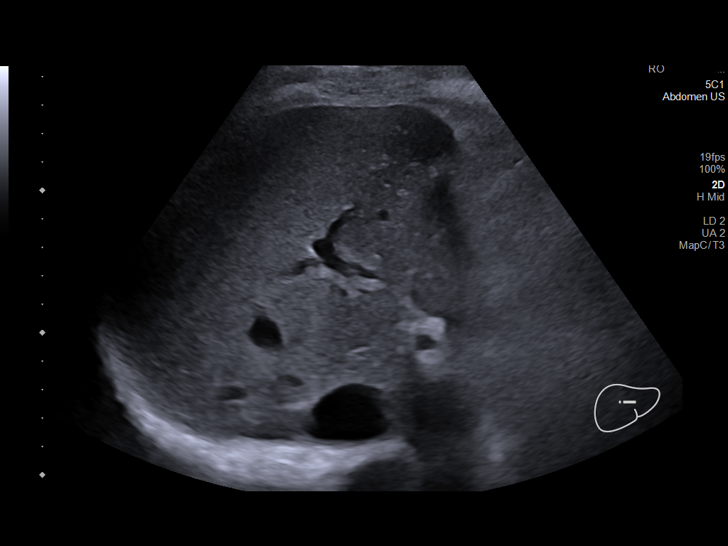
[im 35/35]
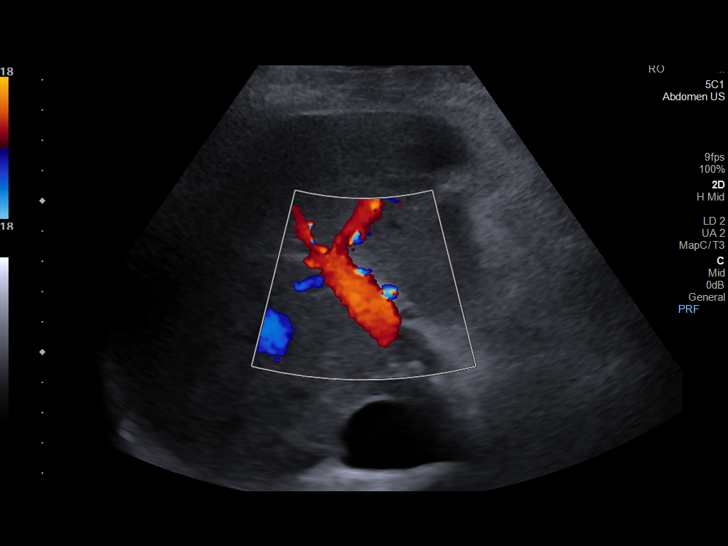

[14 of 25 positions shown; findings below may reference images not displayed]

FINDINGS: Gallbladder:

Prior cholecystectomy.

Common bile duct:

Diameter: 4.7 mm

Liver:

No focal lesion identified. Within normal limits in parenchymal
echogenicity. Portal vein is patent on color Doppler imaging with
normal direction of blood flow towards the liver.

Other: None.
IMPRESSION: Prior cholecystectomy.  No acute abnormality.

## 2023-02-27 ENCOUNTER — Inpatient Hospital Stay: Payer: 59

## 2023-02-27 ENCOUNTER — Inpatient Hospital Stay: Payer: 59 | Attending: Hematology | Admitting: Hematology

## 2023-02-27 VITALS — BP 98/72 | HR 59 | Temp 98.1°F | Resp 16 | Wt 205.9 lb

## 2023-02-27 DIAGNOSIS — C8300 Small cell B-cell lymphoma, unspecified site: Secondary | ICD-10-CM

## 2023-02-27 DIAGNOSIS — Z9221 Personal history of antineoplastic chemotherapy: Secondary | ICD-10-CM | POA: Insufficient documentation

## 2023-02-27 DIAGNOSIS — C9110 Chronic lymphocytic leukemia of B-cell type not having achieved remission: Secondary | ICD-10-CM | POA: Diagnosis not present

## 2023-02-27 LAB — CBC WITH DIFFERENTIAL/PLATELET
Abs Immature Granulocytes: 0.05 10*3/uL (ref 0.00–0.07)
Basophils Absolute: 0.1 10*3/uL (ref 0.0–0.1)
Basophils Relative: 1 %
Eosinophils Absolute: 0.2 10*3/uL (ref 0.0–0.5)
Eosinophils Relative: 2 %
HCT: 40.2 % (ref 39.0–52.0)
Hemoglobin: 13.7 g/dL (ref 13.0–17.0)
Immature Granulocytes: 1 %
Lymphocytes Relative: 36 %
Lymphs Abs: 3 10*3/uL (ref 0.7–4.0)
MCH: 31.9 pg (ref 26.0–34.0)
MCHC: 34.1 g/dL (ref 30.0–36.0)
MCV: 93.7 fL (ref 80.0–100.0)
Monocytes Absolute: 0.5 10*3/uL (ref 0.1–1.0)
Monocytes Relative: 6 %
Neutro Abs: 4.6 10*3/uL (ref 1.7–7.7)
Neutrophils Relative %: 54 %
Platelets: 191 10*3/uL (ref 150–400)
RBC: 4.29 MIL/uL (ref 4.22–5.81)
RDW: 12.9 % (ref 11.5–15.5)
WBC: 8.4 10*3/uL (ref 4.0–10.5)
nRBC: 0 % (ref 0.0–0.2)

## 2023-02-27 LAB — COMPREHENSIVE METABOLIC PANEL
ALT: 17 U/L (ref 0–44)
AST: 19 U/L (ref 15–41)
Albumin: 3.9 g/dL (ref 3.5–5.0)
Alkaline Phosphatase: 54 U/L (ref 38–126)
Anion gap: 6 (ref 5–15)
BUN: 16 mg/dL (ref 8–23)
CO2: 26 mmol/L (ref 22–32)
Calcium: 8.9 mg/dL (ref 8.9–10.3)
Chloride: 106 mmol/L (ref 98–111)
Creatinine, Ser: 0.87 mg/dL (ref 0.61–1.24)
GFR, Estimated: >60 mL/min (ref >60)
Glucose, Bld: 110 mg/dL — ABNORMAL HIGH (ref 70–99)
Potassium: 3.6 mmol/L (ref 3.5–5.1)
Sodium: 138 mmol/L (ref 135–145)
Total Bilirubin: 0.9 mg/dL (ref 0.3–1.2)
Total Protein: 6.5 g/dL (ref 6.5–8.1)

## 2023-02-27 LAB — LACTATE DEHYDROGENASE: LDH: 151 U/L (ref 98–192)

## 2023-02-27 NOTE — Patient Instructions (Signed)
[facility]  Discharge Instructions  You were seen and examined today by Dr. Katragadda.  Dr. Katragadda discussed your most recent lab work and CT scan which revealed that everything looks good and stable.  Follow-up as scheduled in 6 months.    Thank you for choosing [facility] to provide your oncology and hematology care.   To afford each patient quality time with our provider, please arrive at least 15 minutes before your scheduled appointment time. You may need to reschedule your appointment if you arrive late (10 or more minutes). Arriving late affects you and other patients whose appointments are after yours.  Also, if you miss three or more appointments without notifying the office, you may be dismissed from the clinic at the provider's discretion.    Again, thank you for choosing [facility].  Our hope is that these requests will decrease the amount of time that you wait before being seen by our physicians.   If you have a lab appointment with the Cancer Center - please note that after April 8th, all labs will be drawn in the cancer center.  You do not have to check in or register with the main entrance as you have in the past but will complete your check-in at the cancer center.            _____________________________________________________________  Should you have questions after your visit to [facility], please contact our office at [phone] and follow the prompts.  Our office hours are 8:00 a.m. to 4:30 p.m. Monday - Thursday and 8:00 a.m. to 2:30 p.m. Friday.  Please note that voicemails left after 4:00 p.m. may not be returned until the following business day.  We are closed weekends and all major holidays.  You do have access to a nurse 24-7, just call the main number to the clinic [phone] and do not press any options, hold on the line and a nurse will answer the phone.    For  prescription refill requests, have your pharmacy contact our office and allow 72 hours.    Masks are no longer required in the cancer centers. If you would like for your care team to wear a mask while they are taking care of you, please let them know. You may have one support person who is at least 64 years old accompany you for your appointments.  

## 2023-02-27 NOTE — Progress Notes (Signed)
[facility] [address], [place] [address]    Clinic Day:  02/27/2023  Referring physician: Vanessa Derby, FNP  Patient Care Team: Vanessa [place], FNP as PCP - General (Family Medicine) Jena Gauss Gerrit Friends, MD as Consulting Physician (Gastroenterology) Doreatha Massed, MD as Consulting Physician (Hematology and Oncology)   ASSESSMENT & PLAN:   Assessment: 1.  Clinical stage IIIb small lymphocytic lymphoma: -6 cycles of obinutuzumab and venetoclax from 06/09/2019 through 10/27/2019. -PET scan on 11/23/2019 showed complete metabolic response to therapy of lymphoma.  Slight hypermetabolism in the right inguinal region from hernia surgery.  New hypermetabolic soft tissue fullness about the ischial tuberosity, possibly related to hamstring origin injury. -Venetoclax was dose reduced to 200 mg daily secondary to carvedilol. -PET scan on June 20, 2020 with no findings suspicious for active lymphoma. -Venetoclax discontinued in December 2021. - PET scan on 06/20/2020 did not show any evidence of lymphoma. - MRD flow cytometry on 06/13/2020 showed extremely small abnormal B-cell population, less than 0.01% of total cells.   2.  CAD: -Cardiac catheterization on 10/01/2019 found to have 100% occlusion of the RCA. -Angioplasty and stenting of the circumflex was done. -He was started on Effient and Livalo. -Echocardiogram on 01/18/2020 by Dr. Hyacinth Meeker in [place] LVEF 50-55% with mild global hypokinesia, moderate LVH, normal RV function, stage I diastolic dysfunction.   3.  Right posterior hip pain: -MRI of the pelvis without contrast on 01/10/2020 shows complete tear of the right conjoined semitendinosus and biceps femoris tendon at the ischial tuberosity with 2.2 cm retraction.  High-grade partial tear of the right semimembranosus tendon at the ischial tuberosity. -He was evaluated by Dr. Dion Saucier and conservative management recommended.  Plan: 1.  Clinical stage  IIIb small lymphocytic lymphoma: - Denies fevers, night sweats or weight loss in the last 6 months. - Physical exam today: No palpable adenopathy or splenomegaly. - Labs from today: Normal LFTs.  LDH normal.  CBC normal. - No evidence of recurrence.  RTC 6 months with labs on same day.  Orders Placed This Encounter  Procedures   CBC with Differential/Platelet    Standing Status:   Future    Standing Expiration Date:   02/27/2024    Order Specific Question:   Release to patient    Answer:   Immediate   Comprehensive metabolic panel    Standing Status:   Future    Standing Expiration Date:   02/27/2024    Order Specific Question:   Release to patient    Answer:   Immediate   Lactate dehydrogenase    Standing Status:   Future    Standing Expiration Date:   02/27/2024    Order Specific Question:   Release to patient    Answer:   Immediate      I,Helena R Teague,acting as a scribe for Doreatha Massed, MD.,have documented all relevant documentation on the behalf of Doreatha Massed, MD,as directed by  Doreatha Massed, MD while in the presence of Doreatha Massed, MD.   I, Doreatha Massed MD, have reviewed the above documentation for accuracy and completeness, and I agree with the above.   Doreatha Massed, MD   7/31/20245:15 PM  CHIEF COMPLAINT:   Diagnosis: Small lymphocytic lymphoma    Cancer Staging  No matching staging information was found for the patient.    Prior Therapy: 1. Obinutuzumab & venetoclax x 6 cycles from 06/09/2019 to 10/27/2019. 2. Venetoclax 200 mg through 06/2020.  Current Therapy:  surveillance   HISTORY OF PRESENT ILLNESS:   Oncology History  Small lymphocytic lymphoma (HCC)  05/04/2019 Initial Diagnosis   Small lymphocytic lymphoma (HCC)   06/09/2019 - 10/27/2019 Chemotherapy   Patient is on Treatment Plan : LEUKEMIA CLL Obinutuzumab q28d        INTERVAL HISTORY:   Patrick Rubio is a 62 y.o. male presenting to clinic today for  follow up of Small lymphocytic lymphoma. He was last seen by me on 08/29/22.  Today, he states that he is doing well overall. His appetite level is at 100%. His energy level is at 100%.  He denies skin rashes, fever, sight sweats, or unexpected weight loss. He reports of an improved appetite. He c/o diarrhea 3x a day that are watery.   PAST MEDICAL HISTORY:   Past Medical History: Past Medical History:  Diagnosis Date   CAD (coronary artery disease)    GERD (gastroesophageal reflux disease)    HTN (hypertension)    Hypercholesterolemia    MI (myocardial infarction) (HCC) 2003   Small lymphocytic lymphoma (HCC)    Stomach cancer (HCC)     Surgical History: Past Surgical History:  Procedure Laterality Date   AXILLARY LYMPH NODE BIOPSY Left 04/27/2019   Procedure: AXILLARY LYMPH NODE BIOPSY;  Surgeon: Franky Macho, MD;  Location: [facility];  Service: General;  Laterality: Left;   BIOPSY  11/26/2018   Procedure: BIOPSY;  Surgeon: Dominy Ade, MD;  Location: [facility];  Service: Endoscopy;;   BIOPSY  03/08/2022   Procedure: BIOPSY;  Surgeon: Dadisman Ade, MD;  Location: [facility];  Service: Endoscopy;;   CHOLECYSTECTOMY N/A 05/18/2019   Procedure: LAPAROSCOPIC CHOLECYSTECTOMY;  Surgeon: Franky Macho, MD;  Location: [facility];  Service: General;  Laterality: N/A;   COLONOSCOPY WITH PROPOFOL N/A 03/08/2022   Procedure: COLONOSCOPY WITH PROPOFOL;  Surgeon: Fester Ade, MD;  Location: [facility];  Service: Endoscopy;  Laterality: N/A;  8:45am   CORONARY ANGIOPLASTY WITH STENT PLACEMENT     2003   ESOPHAGOGASTRODUODENOSCOPY N/A 11/26/2018   mild erosive reflux esophagitis, gastric erythema. Negative H.pylori    ESOPHAGOGASTRODUODENOSCOPY (EGD) WITH PROPOFOL N/A 03/08/2022   Procedure: ESOPHAGOGASTRODUODENOSCOPY (EGD) WITH PROPOFOL;  Surgeon: Wagar Ade, MD;  [facility]: [facility];  Service: Endoscopy;  Laterality: N/A;   INGUINAL HERNIA REPAIR Right 04/27/2019    Procedure: HERNIA REPAIR INGUINAL ADULT;  Surgeon: Franky Macho, MD;  Location: [facility];  Service: General;  Laterality: Right;   KNEE SURGERY Right    lumbar fusion with cage  07/2017   NASAL SINUS SURGERY     TONSILLECTOMY AND ADENOIDECTOMY      Social History: Social History   Socioeconomic History   Marital status: Divorced    Spouse name: Not on file   Number of children: 2   Years of education: Not on file   Highest education level: Not on file  Occupational History   Occupation: employed    Comment: part time at [employer] home  Tobacco Use   Smoking status: Every Day    Current packs/day: 0.50    Average packs/day: 0.5 packs/day for 25.0 years (12.5 ttl pk-yrs)    Types: Cigarettes   Smokeless tobacco: Never  Vaping Use   Vaping status: Never Used  Substance and Sexual Activity   Alcohol use: Yes    Comment: occ   Drug use: Never   Sexual activity: Yes  Other Topics Concern   Not on file  Social History Narrative  Not on file   Social Determinants of Health   Financial Resource Strain: Low Risk  (06/22/2020)   Overall Financial Resource Strain (CARDIA)    Difficulty of Paying Living Expenses: Not hard at all  Food Insecurity: No Food Insecurity (06/22/2020)   Hunger Vital Sign    Worried About Running Out of Food in the Last Year: Never true    Ran Out of Food in the Last Year: Never true  Transportation Needs: No Transportation Needs (06/22/2020)   PRAPARE - Administrator, Civil Service (Medical): No    Lack of Transportation (Non-Medical): No  Physical Activity: Inactive (06/22/2020)   Exercise Vital Sign    Days of Exercise per Week: 0 days    Minutes of Exercise per Session: 0 min  Stress: No Stress Concern Present (06/22/2020)   [employer] of Occupational Health - Occupational Stress Questionnaire    Feeling of Stress : Not at all  Social Connections: Socially Isolated (06/22/2020)   Social Connection and Isolation Panel [NHANES]     Frequency of Communication with Friends and Family: More than three times a week    Frequency of Social Gatherings with Friends and Family: More than three times a week    Attends Religious Services: Never    Database administrator or Organizations: No    Attends Banker Meetings: Never    Marital Status: Divorced  Catering manager Violence: Not At Risk (06/22/2020)   Humiliation, Afraid, Rape, and Kick questionnaire    Fear of Current or Ex-Partner: No    Emotionally Abused: No    Physically Abused: No    Sexually Abused: No    Family History: Family History  Problem Relation Age of Onset   Colon polyps Father        thinks he may have had polyps, possibly in his 54s.    Heart attack Father    Dementia Mother    Hypotension Mother    Stroke Sister    Diabetes Maternal Grandmother    Stroke Maternal Grandfather    Cancer Paternal Grandmother    Congestive Heart Failure Paternal Grandfather    Kidney cancer Daughter    Colon cancer Neg Hx    Pancreatic cancer Neg Hx    Liver disease Neg Hx     Current Medications:  Current Outpatient Medications:    acetaminophen (TYLENOL) 650 MG CR tablet, Take 650 mg by mouth in the morning and at bedtime., Disp: , Rfl:    aspirin EC 81 MG tablet, Take 81 mg by mouth every Monday, Wednesday, and Friday. IN THE MORNING, Disp: , Rfl:    baclofen (LIORESAL) 10 MG tablet, Take 10 mg by mouth 2 (two) times daily., Disp: , Rfl:    carvedilol (COREG) 6.25 MG tablet, Take 6.25 mg by mouth 2 (two) times daily., Disp: , Rfl:    celecoxib (CELEBREX) 200 MG capsule, Take 200 mg by mouth in the morning and at bedtime., Disp: , Rfl:    cholecalciferol (VITAMIN D3) 25 MCG (1000 UNIT) tablet, Take 1,000 Units by mouth in the morning and at bedtime., Disp: , Rfl:    dicyclomine (BENTYL) 10 MG capsule, TAKE 1 CAPSULE (10 MG TOTAL) BY MOUTH 2 (TWO) TIMES DAILY AS NEEDED FOR SPASMS (DIARRHEA)., Disp: 180 capsule, Rfl: 1   ENTRESTO 49-51 MG,  Take 1 tablet by mouth 2 (two) times daily., Disp: , Rfl:    escitalopram (LEXAPRO) 20 MG tablet, Take 20 mg by mouth  in the morning., Disp: , Rfl:    gabapentin (NEURONTIN) 300 MG capsule, Take 300 mg by mouth 2 (two) times daily., Disp: , Rfl:    nitroGLYCERIN (NITROSTAT) 0.4 MG SL tablet, Place 0.4 mg under the tongue every 5 (five) minutes x 3 doses as needed for chest pain., Disp: , Rfl:    pantoprazole (PROTONIX) 40 MG tablet, TAKE 1 TABLET BY MOUTH EVERY DAY, Disp: 90 tablet, Rfl: 3   Pitavastatin Calcium (LIVALO) 4 MG TABS, Take 4 mg by mouth every evening., Disp: , Rfl:    prasugrel (EFFIENT) 10 MG TABS tablet, Take 10 mg by mouth in the morning., Disp: , Rfl:    Allergies: No Known Allergies  REVIEW OF SYSTEMS:   Review of Systems  Constitutional:  Negative for chills, fatigue and fever.  HENT:   Negative for lump/mass, mouth sores, nosebleeds, sore throat and trouble swallowing.   Eyes:  Negative for eye problems.  Respiratory:  Negative for cough and shortness of breath.   Cardiovascular:  Negative for chest pain, leg swelling and palpitations.  Gastrointestinal:  Positive for abdominal pain and diarrhea. Negative for constipation, nausea and vomiting.  Genitourinary:  Negative for bladder incontinence, difficulty urinating, dysuria, frequency, hematuria and nocturia.   Musculoskeletal:  Negative for arthralgias, back pain, flank pain, myalgias and neck pain.  Skin:  Negative for itching and rash.  Neurological:  Negative for dizziness, headaches and numbness.  Hematological:  Does not bruise/bleed easily.  Psychiatric/Behavioral:  Negative for depression, sleep disturbance and suicidal ideas. The patient is not nervous/anxious.   All other systems reviewed and are negative.    VITALS:   Blood pressure 98/72, pulse (!) 59, temperature 98.1 F (36.7 C), temperature source Oral, resp. rate 16, weight 205 lb 14.4 oz (93.4 kg), SpO2 100%.  Wt Readings from Last 3 Encounters:   02/27/23 205 lb 14.4 oz (93.4 kg)  08/29/22 197 lb 1.5 oz (89.4 kg)  04/17/22 194 lb 3.2 oz (88.1 kg)    Body mass index is 29.54 kg/m.  Performance status (ECOG): 1 - Symptomatic but completely ambulatory  PHYSICAL EXAM:   Physical Exam Vitals and nursing note reviewed. Exam conducted with a chaperone present.  Constitutional:      Appearance: Normal appearance.  Cardiovascular:     Rate and Rhythm: Normal rate and regular rhythm.     Pulses: Normal pulses.     Heart sounds: Normal heart sounds.  Pulmonary:     Effort: Pulmonary effort is normal.     Breath sounds: Normal breath sounds.  Abdominal:     Palpations: Abdomen is soft. There is no hepatomegaly, splenomegaly or mass.     Tenderness: There is no abdominal tenderness.  Musculoskeletal:     Right lower leg: No edema.     Left lower leg: No edema.  Lymphadenopathy:     Cervical: No cervical adenopathy.     Right cervical: No superficial, deep or posterior cervical adenopathy.    Left cervical: No superficial, deep or posterior cervical adenopathy.     Upper Body:     Right upper body: No supraclavicular or axillary adenopathy.     Left upper body: No supraclavicular or axillary adenopathy.  Neurological:     General: No focal deficit present.     Mental Status: He is alert and oriented to person, place, and time.  Psychiatric:        Mood and Affect: Mood normal.        Behavior: Behavior  normal.     LABS:      Latest Ref Rng & Units 02/27/2023    1:12 PM 08/29/2022   10:54 AM 02/12/2022    9:01 AM  CBC  WBC 4.0 - 10.5 K/uL 8.4  8.8  5.6   Hemoglobin 13.0 - 17.0 g/dL 40.9  81.1  91.4   Hematocrit 39.0 - 52.0 % 40.2  41.2  38.2   Platelets 150 - 400 K/uL 191  173  169       Latest Ref Rng & Units 02/27/2023    1:12 PM 08/29/2022   10:54 AM 04/24/2022    8:19 AM  CMP  Glucose 70 - 99 mg/dL 782  84    BUN 8 - 23 mg/dL 16  9    Creatinine 9.56 - 1.24 mg/dL 2.13  0.86    Sodium 578 - 145 mmol/L 138   138    Potassium 3.5 - 5.1 mmol/L 3.6  4.0    Chloride 98 - 111 mmol/L 106  106    CO2 22 - 32 mmol/L 26  25    Calcium 8.9 - 10.3 mg/dL 8.9  8.8    Total Protein 6.5 - 8.1 g/dL 6.5  6.6  6.2   Total Bilirubin 0.3 - 1.2 mg/dL 0.9  0.8  0.8   Alkaline Phos 38 - 126 U/L 54  60  63   AST 15 - 41 U/L 19  16  13    ALT 0 - 44 U/L 17  12  11       No results found for: "CEA1", "CEA" / No results found for: "CEA1", "CEA" No results found for: "PSA1" No results found for: "ION629" No results found for: "CAN125"  No results found for: "TOTALPROTELP", "ALBUMINELP", "A1GS", "A2GS", "BETS", "BETA2SER", "GAMS", "MSPIKE", "SPEI" Lab Results  Component Value Date   TIBC 310 11/20/2021   FERRITIN 53 11/20/2021   IRONPCTSAT 7 (L) 11/20/2021   Lab Results  Component Value Date   LDH 151 02/27/2023   LDH 148 08/29/2022   LDH 131 02/12/2022     STUDIES:   No results found.

## 2023-07-22 ENCOUNTER — Other Ambulatory Visit: Payer: Self-pay | Admitting: Gastroenterology

## 2023-07-22 DIAGNOSIS — R197 Diarrhea, unspecified: Secondary | ICD-10-CM

## 2023-07-22 DIAGNOSIS — R634 Abnormal weight loss: Secondary | ICD-10-CM

## 2023-07-22 DIAGNOSIS — K8020 Calculus of gallbladder without cholecystitis without obstruction: Secondary | ICD-10-CM

## 2023-08-29 ENCOUNTER — Inpatient Hospital Stay: Payer: 59

## 2023-08-29 ENCOUNTER — Inpatient Hospital Stay: Payer: 59 | Admitting: Hematology

## 2023-09-03 ENCOUNTER — Inpatient Hospital Stay: Payer: 59 | Attending: Hematology

## 2023-09-03 ENCOUNTER — Encounter: Payer: Self-pay | Admitting: Hematology

## 2023-09-03 ENCOUNTER — Inpatient Hospital Stay: Payer: 59 | Admitting: Hematology

## 2023-09-03 VITALS — BP 127/76 | HR 61 | Temp 96.2°F | Resp 18 | Wt 204.4 lb

## 2023-09-03 DIAGNOSIS — C8300 Small cell B-cell lymphoma, unspecified site: Secondary | ICD-10-CM

## 2023-09-03 DIAGNOSIS — Z79899 Other long term (current) drug therapy: Secondary | ICD-10-CM | POA: Diagnosis not present

## 2023-09-03 LAB — COMPREHENSIVE METABOLIC PANEL
ALT: 17 U/L (ref 0–44)
AST: 21 U/L (ref 15–41)
Albumin: 4 g/dL (ref 3.5–5.0)
Alkaline Phosphatase: 56 U/L (ref 38–126)
Anion gap: 8 (ref 5–15)
BUN: 14 mg/dL (ref 8–23)
CO2: 25 mmol/L (ref 22–32)
Calcium: 9 mg/dL (ref 8.9–10.3)
Chloride: 106 mmol/L (ref 98–111)
Creatinine, Ser: 0.66 mg/dL (ref 0.61–1.24)
GFR, Estimated: >60 mL/min (ref >60)
Glucose, Bld: 101 mg/dL — ABNORMAL HIGH (ref 70–99)
Potassium: 4.3 mmol/L (ref 3.5–5.1)
Sodium: 139 mmol/L (ref 135–145)
Total Bilirubin: 1 mg/dL (ref 0.0–1.2)
Total Protein: 7 g/dL (ref 6.5–8.1)

## 2023-09-03 LAB — CBC WITH DIFFERENTIAL/PLATELET
Abs Immature Granulocytes: 0.03 10*3/uL (ref 0.00–0.07)
Basophils Absolute: 0.1 10*3/uL (ref 0.0–0.1)
Basophils Relative: 1 %
Eosinophils Absolute: 0.5 10*3/uL (ref 0.0–0.5)
Eosinophils Relative: 6 %
HCT: 42.7 % (ref 39.0–52.0)
Hemoglobin: 14.5 g/dL (ref 13.0–17.0)
Immature Granulocytes: 0 %
Lymphocytes Relative: 19 %
Lymphs Abs: 1.7 10*3/uL (ref 0.7–4.0)
MCH: 31.3 pg (ref 26.0–34.0)
MCHC: 34 g/dL (ref 30.0–36.0)
MCV: 92 fL (ref 80.0–100.0)
Monocytes Absolute: 0.6 10*3/uL (ref 0.1–1.0)
Monocytes Relative: 7 %
Neutro Abs: 5.7 10*3/uL (ref 1.7–7.7)
Neutrophils Relative %: 67 %
Platelets: 197 10*3/uL (ref 150–400)
RBC: 4.64 MIL/uL (ref 4.22–5.81)
RDW: 12.7 % (ref 11.5–15.5)
WBC: 8.6 10*3/uL (ref 4.0–10.5)
nRBC: 0 % (ref 0.0–0.2)

## 2023-09-03 LAB — LACTATE DEHYDROGENASE: LDH: 188 U/L (ref 98–192)

## 2023-09-03 NOTE — Patient Instructions (Signed)
[facility] at [facility] Discharge Instructions   You were seen and examined today by Dr. Ellin Saba.  He reviewed the results of your lab work which are normal/stable.   We will see you back in 6 months. We will repeat lab work at this visit.   Return as scheduled.    Thank you for choosing [facility] at [facility] to provide your oncology and hematology care.  To afford each patient quality time with our provider, please arrive at least 15 minutes before your scheduled appointment time.   If you have a lab appointment with the Cancer Center please come in thru the Main Entrance and check in at the main information desk.  You need to re-schedule your appointment should you arrive 10 or more minutes late.  We strive to give you quality time with our providers, and arriving late affects you and other patients whose appointments are after yours.  Also, if you no show three or more times for appointments you may be dismissed from the clinic at the providers discretion.     Again, thank you for choosing [facility].  Our hope is that these requests will decrease the amount of time that you wait before being seen by our physicians.       _____________________________________________________________  Should you have questions after your visit to [facility], please contact our office at [phone] and follow the prompts.  Our office hours are 8:00 a.m. and 4:30 p.m. Monday - Friday.  Please note that voicemails left after 4:00 p.m. may not be returned until the following business day.  We are closed weekends and major holidays.  You do have access to a nurse 24-7, just call the main number to the clinic [phone] and do not press any options, hold on the line and a nurse will answer the phone.    For prescription refill requests, have your pharmacy contact our office and allow 72 hours.    Due to Covid, you will need  to wear a mask upon entering the hospital. If you do not have a mask, a mask will be given to you at the Main Entrance upon arrival. For doctor visits, patients may have 1 support person age 69 or older with them. For treatment visits, patients can not have anyone with them due to social distancing guidelines and our immunocompromised population.

## 2023-09-03 NOTE — Progress Notes (Signed)
 [facility] [address], [place] [address]    Clinic Day:  09/03/2023  Referring physician: Paola Greig CROME, FNP  Patient Care Team: Paola Greig CROME, FNP as PCP - General (Family Medicine) Shaaron Lamar HERO, MD as Consulting Physician (Gastroenterology) Rogers Hai, MD as Consulting Physician (Hematology and Oncology)   ASSESSMENT & PLAN:   Assessment: 1.  Clinical stage IIIb small lymphocytic lymphoma: -6 cycles of obinutuzumab  and venetoclax  from 06/09/2019 through 10/27/2019. -PET scan on 11/23/2019 showed complete metabolic response to therapy of lymphoma.  Slight hypermetabolism in the right inguinal region from hernia surgery.  New hypermetabolic soft tissue fullness about the ischial tuberosity, possibly related to hamstring origin injury. -Venetoclax  was dose reduced to 200 mg daily secondary to carvedilol . -PET scan on June 20, 2020 with no findings suspicious for active lymphoma. -Venetoclax  discontinued in December 2021. - PET scan on 06/20/2020 did not show any evidence of lymphoma. - MRD flow cytometry on 06/13/2020 showed extremely small abnormal B-cell population, less than 0.01% of total cells.   2.  CAD: -Cardiac catheterization on 10/01/2019 found to have 100% occlusion of the RCA. -Angioplasty and stenting of the circumflex was done. -He was started on Effient  and Livalo . -Echocardiogram on 01/18/2020 by Dr. Cleotilde in [place] LVEF 50-55% with mild global hypokinesia, moderate LVH, normal RV function, stage I diastolic dysfunction.   3.  Right posterior hip pain: -MRI of the pelvis without contrast on 01/10/2020 shows complete tear of the right conjoined semitendinosus and biceps femoris tendon at the ischial tuberosity with 2.2 cm retraction.  High-grade partial tear of the right semimembranosus tendon at the ischial tuberosity. -He was evaluated by Dr. Josefina and conservative management recommended.  Plan: 1.  Clinical stage IIIb  small lymphocytic lymphoma: - Denies any fevers, night sweats or weight loss in the last 6 months.  No infections reported. - Physical exam: No palpable adenopathy or splenomegaly.  Mild tenderness in the right upper quadrant with no palpable mass. - Reviewed labs: Normal LFTs and LDH.  CBC was normal. - Recommend follow-up in 6 months with repeat labs and exam.  He is having knee replacement on the right side in April.  Orders Placed This Encounter  Procedures   CBC with Differential    Standing Status:   Future    Expected Date:   03/02/2024    Expiration Date:   09/02/2024   Comprehensive metabolic panel    Standing Status:   Future    Expected Date:   03/02/2024    Expiration Date:   09/02/2024   Lactate dehydrogenase    Standing Status:   Future    Expected Date:   03/02/2024    Expiration Date:   09/02/2024      LILLETTE Hummingbird R Teague,acting as a scribe for Hai Rogers, MD.,have documented all relevant documentation on the behalf of Hai Rogers, MD,as directed by  Hai Rogers, MD while in the presence of Hai Rogers, MD.  I, Hai Rogers MD, have reviewed the above documentation for accuracy and completeness, and I agree with the above.    Hai Rogers, MD   2/4/20253:03 PM  CHIEF COMPLAINT:   Diagnosis: Small lymphocytic lymphoma    Cancer Staging  No matching staging information was found for the patient.    Prior Therapy: 1. Obinutuzumab  & venetoclax  x 6 cycles from 06/09/2019 to 10/27/2019. 2. Venetoclax  200 mg through 06/2020.  Current Therapy:  surveillance   HISTORY OF PRESENT ILLNESS:  Oncology History  Small lymphocytic lymphoma (HCC)  05/04/2019 Initial Diagnosis   Small lymphocytic lymphoma (HCC)   06/09/2019 - 10/27/2019 Chemotherapy   Patient is on Treatment Plan : LEUKEMIA CLL Obinutuzumab  q28d        INTERVAL HISTORY:   Patrick Rubio is a 63 y.o. male presenting to clinic today for follow up of Small lymphocytic  lymphoma. He was last seen by me on 02/27/23.  He has a right knee arthroplasty scheduled for 11/11/23 with Dr. Melodi.   Today, he states that he is doing well overall. His appetite level is at 100%. His energy level is at 100%. He notes a normal appetite. He denies any B symptoms and infections in the last 6 months. He reports abdominal pain is not a new onset and is followed by GI, with an appointment soon.   PAST MEDICAL HISTORY:   Past Medical History: Past Medical History:  Diagnosis Date   CAD (coronary artery disease)    GERD (gastroesophageal reflux disease)    HTN (hypertension)    Hypercholesterolemia    MI (myocardial infarction) (HCC) 2003   Small lymphocytic lymphoma (HCC)    Stomach cancer (HCC)     Surgical History: Past Surgical History:  Procedure Laterality Date   AXILLARY LYMPH NODE BIOPSY Left 04/27/2019   Procedure: AXILLARY LYMPH NODE BIOPSY;  Surgeon: Mavis Anes, MD;  Location: [facility];  Service: General;  Laterality: Left;   BIOPSY  11/26/2018   Procedure: BIOPSY;  Surgeon: Shaaron Lamar HERO, MD;  Location: [facility];  Service: Endoscopy;;   BIOPSY  03/08/2022   Procedure: BIOPSY;  Surgeon: Shaaron Lamar HERO, MD;  Location: [facility];  Service: Endoscopy;;   CHOLECYSTECTOMY N/A 05/18/2019   Procedure: LAPAROSCOPIC CHOLECYSTECTOMY;  Surgeon: Mavis Anes, MD;  Location: [facility];  Service: General;  Laterality: N/A;   COLONOSCOPY WITH PROPOFOL  N/A 03/08/2022   Procedure: COLONOSCOPY WITH PROPOFOL ;  Surgeon: Shaaron Lamar HERO, MD;  [facility]: [facility];  Service: Endoscopy;  Laterality: N/A;  8:45am   CORONARY ANGIOPLASTY WITH STENT PLACEMENT     2003   ESOPHAGOGASTRODUODENOSCOPY N/A 11/26/2018   mild erosive reflux esophagitis, gastric erythema. Negative H.pylori    ESOPHAGOGASTRODUODENOSCOPY (EGD) WITH PROPOFOL  N/A 03/08/2022   Procedure: ESOPHAGOGASTRODUODENOSCOPY (EGD) WITH PROPOFOL ;  Surgeon: Shaaron Lamar HERO, MD;  [facility]: [facility];  Service:  Endoscopy;  Laterality: N/A;   INGUINAL HERNIA REPAIR Right 04/27/2019   Procedure: HERNIA REPAIR INGUINAL ADULT;  Surgeon: Mavis Anes, MD;  Location: [facility];  Service: General;  Laterality: Right;   KNEE SURGERY Right    lumbar fusion with cage  07/2017   NASAL SINUS SURGERY     TONSILLECTOMY AND ADENOIDECTOMY      Social History: Social History   Socioeconomic History   Marital status: Divorced    Spouse name: Not on file   Number of children: 2   Years of education: Not on file   Highest education level: Not on file  Occupational History   Occupation: employed    Comment: part time at [employer] home  Tobacco Use   Smoking status: Every Day    Current packs/day: 0.50    Average packs/day: 0.5 packs/day for 25.0 years (12.5 ttl pk-yrs)    Types: Cigarettes   Smokeless tobacco: Never  Vaping Use   Vaping status: Never Used  Substance and Sexual Activity   Alcohol use: Yes    Comment: occ   Drug use: Never   Sexual activity: Yes  Other Topics Concern   Not on file  Social History Narrative   Not on file   Social Drivers of Health   Financial Resource Strain: Low Risk  (06/22/2020)   Overall Financial Resource Strain (CARDIA)    Difficulty of Paying Living Expenses: Not hard at all  Food Insecurity: No Food Insecurity (06/22/2020)   Hunger Vital Sign    Worried About Running Out of Food in the Last Year: Never true    Ran Out of Food in the Last Year: Never true  Transportation Needs: No Transportation Needs (06/22/2020)   PRAPARE - Administrator, Civil Service (Medical): No    Lack of Transportation (Non-Medical): No  Physical Activity: Inactive (06/22/2020)   Exercise Vital Sign    Days of Exercise per Week: 0 days    Minutes of Exercise per Session: 0 min  Stress: No Stress Concern Present (06/22/2020)   [employer] of Occupational Health - Occupational Stress Questionnaire    Feeling of Stress : Not at all  Social Connections: Socially  Isolated (06/22/2020)   Social Connection and Isolation Panel [NHANES]    Frequency of Communication with Friends and Family: More than three times a week    Frequency of Social Gatherings with Friends and Family: More than three times a week    Attends Religious Services: Never    Database Administrator or Organizations: No    Attends Banker Meetings: Never    Marital Status: Divorced  Catering Manager Violence: Not At Risk (06/22/2020)   Humiliation, Afraid, Rape, and Kick questionnaire    Fear of Current or Ex-Partner: No    Emotionally Abused: No    Physically Abused: No    Sexually Abused: No    Family History: Family History  Problem Relation Age of Onset   Colon polyps Father        thinks he may have had polyps, possibly in his 79s.    Heart attack Father    Dementia Mother    Hypotension Mother    Stroke Sister    Diabetes Maternal Grandmother    Stroke Maternal Grandfather    Cancer Paternal Grandmother    Congestive Heart Failure Paternal Grandfather    Kidney cancer Daughter    Colon cancer Neg Hx    Pancreatic cancer Neg Hx    Liver disease Neg Hx     Current Medications:  Current Outpatient Medications:    acetaminophen  (TYLENOL ) 650 MG CR tablet, Take 650 mg by mouth in the morning and at bedtime., Disp: , Rfl:    aspirin  EC 81 MG tablet, Take 81 mg by mouth every Monday, Wednesday, and Friday. IN THE MORNING, Disp: , Rfl:    baclofen  (LIORESAL ) 10 MG tablet, Take 10 mg by mouth 2 (two) times daily., Disp: , Rfl:    carvedilol  (COREG ) 6.25 MG tablet, Take 6.25 mg by mouth 2 (two) times daily., Disp: , Rfl:    celecoxib (CELEBREX) 200 MG capsule, Take 200 mg by mouth in the morning and at bedtime., Disp: , Rfl:    cholecalciferol (VITAMIN D3) 25 MCG (1000 UNIT) tablet, Take 1,000 Units by mouth in the morning and at bedtime., Disp: , Rfl:    dicyclomine  (BENTYL ) 10 MG capsule, TAKE 1 CAPSULE (10 MG TOTAL) BY MOUTH 2 (TWO) TIMES DAILY AS NEEDED  FOR SPASMS (DIARRHEA)., Disp: 180 capsule, Rfl: 1   ENTRESTO  49-51 MG, Take 1 tablet by mouth 2 (two) times daily., Disp: ,  Rfl:    escitalopram  (LEXAPRO ) 20 MG tablet, Take 20 mg by mouth in the morning., Disp: , Rfl:    gabapentin  (NEURONTIN ) 300 MG capsule, Take 300 mg by mouth 2 (two) times daily., Disp: , Rfl:    nitroGLYCERIN  (NITROSTAT ) 0.4 MG SL tablet, Place 0.4 mg under the tongue every 5 (five) minutes x 3 doses as needed for chest pain., Disp: , Rfl:    pantoprazole  (PROTONIX ) 40 MG tablet, TAKE 1 TABLET BY MOUTH EVERY DAY, Disp: 90 tablet, Rfl: 3   Pitavastatin  Calcium (LIVALO ) 4 MG TABS, Take 4 mg by mouth every evening., Disp: , Rfl:    prasugrel  (EFFIENT ) 10 MG TABS tablet, Take 10 mg by mouth in the morning., Disp: , Rfl:    Allergies: No Known Allergies  REVIEW OF SYSTEMS:   Review of Systems  Constitutional:  Negative for chills, fatigue and fever.  HENT:   Negative for lump/mass, mouth sores, nosebleeds, sore throat and trouble swallowing.   Eyes:  Negative for eye problems.  Respiratory:  Negative for cough and shortness of breath.   Cardiovascular:  Negative for chest pain, leg swelling and palpitations.  Gastrointestinal:  Positive for abdominal pain (8/10 severity). Negative for constipation, diarrhea, nausea and vomiting.  Genitourinary:  Negative for bladder incontinence, difficulty urinating, dysuria, frequency, hematuria and nocturia.   Musculoskeletal:  Negative for arthralgias, back pain, flank pain, myalgias and neck pain.  Skin:  Negative for itching and rash.  Neurological:  Negative for dizziness, headaches and numbness.  Hematological:  Does not bruise/bleed easily.  Psychiatric/Behavioral:  Negative for depression, sleep disturbance and suicidal ideas. The patient is not nervous/anxious.   All other systems reviewed and are negative.    VITALS:   Blood pressure 127/76, pulse 61, temperature (!) 96.2 F (35.7 C), temperature source Tympanic, resp.  rate 18, weight 204 lb 5.9 oz (92.7 kg), SpO2 100%.  Wt Readings from Last 3 Encounters:  09/03/23 204 lb 5.9 oz (92.7 kg)  02/27/23 205 lb 14.4 oz (93.4 kg)  08/29/22 197 lb 1.5 oz (89.4 kg)    Body mass index is 29.32 kg/m.  Performance status (ECOG): 1 - Symptomatic but completely ambulatory  PHYSICAL EXAM:   Physical Exam Vitals and nursing note reviewed. Exam conducted with a chaperone present.  Constitutional:      Appearance: Normal appearance.  Cardiovascular:     Rate and Rhythm: Normal rate and regular rhythm.     Pulses: Normal pulses.     Heart sounds: Normal heart sounds.  Pulmonary:     Effort: Pulmonary effort is normal.     Breath sounds: Normal breath sounds.  Abdominal:     Palpations: Abdomen is soft. There is no hepatomegaly, splenomegaly or mass.     Tenderness: There is abdominal tenderness (on deep inspiration) in the right upper quadrant.  Musculoskeletal:     Right lower leg: No edema.     Left lower leg: No edema.  Lymphadenopathy:     Cervical: No cervical adenopathy.     Right cervical: No superficial, deep or posterior cervical adenopathy.    Left cervical: No superficial, deep or posterior cervical adenopathy.     Upper Body:     Right upper body: No supraclavicular or axillary adenopathy.     Left upper body: No supraclavicular or axillary adenopathy.  Neurological:     General: No focal deficit present.     Mental Status: He is alert and oriented to person, place, and time.  Psychiatric:        Mood and Affect: Mood normal.        Behavior: Behavior normal.     LABS:      Latest Ref Rng & Units 09/03/2023   12:46 PM 02/27/2023    1:12 PM 08/29/2022   10:54 AM  CBC  WBC 4.0 - 10.5 K/uL 8.6  8.4  8.8   Hemoglobin 13.0 - 17.0 g/dL 85.4  86.2  86.2   Hematocrit 39.0 - 52.0 % 42.7  40.2  41.2   Platelets 150 - 400 K/uL 197  191  173       Latest Ref Rng & Units 09/03/2023   12:46 PM 02/27/2023    1:12 PM 08/29/2022   10:54 AM  CMP   Glucose 70 - 99 mg/dL 898  889  84   BUN 8 - 23 mg/dL 14  16  9    Creatinine 0.61 - 1.24 mg/dL 9.33  9.12  9.30   Sodium 135 - 145 mmol/L 139  138  138   Potassium 3.5 - 5.1 mmol/L 4.3  3.6  4.0   Chloride 98 - 111 mmol/L 106  106  106   CO2 22 - 32 mmol/L 25  26  25    Calcium 8.9 - 10.3 mg/dL 9.0  8.9  8.8   Total Protein 6.5 - 8.1 g/dL 7.0  6.5  6.6   Total Bilirubin 0.0 - 1.2 mg/dL 1.0  0.9  0.8   Alkaline Phos 38 - 126 U/L 56  54  60   AST 15 - 41 U/L 21  19  16    ALT 0 - 44 U/L 17  17  12       No results found for: CEA1, CEA / No results found for: CEA1, CEA No results found for: PSA1 No results found for: CAN199 No results found for: CAN125  No results found for: TOTALPROTELP, ALBUMINELP, A1GS, A2GS, BETS, BETA2SER, GAMS, MSPIKE, SPEI Lab Results  Component Value Date   TIBC 310 11/20/2021   FERRITIN 53 11/20/2021   IRONPCTSAT 7 (L) 11/20/2021   Lab Results  Component Value Date   LDH 188 09/03/2023   LDH 151 02/27/2023   LDH 148 08/29/2022     STUDIES:   No results found.

## 2023-10-16 ENCOUNTER — Telehealth: Payer: Self-pay | Admitting: *Deleted

## 2023-10-16 NOTE — Telephone Encounter (Signed)
   Pre-operative Risk Assessment    Patient Name: Patrick Rubio  DOB: [birth year] MRN: [id number]   Date of last office visit: NONE Date of next office visit: NONE-PT WILL NEED A NEW PT APPT FOR PREOP CLEARANCE   Request for Surgical Clearance    Procedure:   RIGHT TOTAL KNEE ARTHROPLASTY  Date of Surgery:  Clearance 11/11/23                                Surgeon:  DR. Ollen Gross Surgeon's Group or Practice Name:  [facility] Phone number:  [phone] [facility] Fax number:  [phone]   Type of Clearance Requested:   - Medical  - Pharmacy:  Hold Aspirin and Prasugrel (Effient)     Type of Anesthesia:   CHOICE   Additional requests/questions:    Elpidio Anis   10/16/2023, 5:17 PM

## 2023-10-17 NOTE — Telephone Encounter (Signed)
 Pt will need a new pt appt. I will reach out to our scheduling team to see where we can bring the pt in for new pt appt for preop clearance. I s/w the pt and he tells me that he see's [facility] Dr. Hyacinth Meeker. I thanked pt for the help and assured him that I will update Dr. Lequita Halt and his surgery scheduler Lawson Fiscal.

## 2023-10-17 NOTE — Telephone Encounter (Signed)
    Primary Cardiologist:None  Chart reviewed as part of pre-operative protocol coverage. Because of Patrick Rubio's past medical history and time since last visit, he/she will require a follow-up visit in order to better assess preoperative cardiovascular risk.  Pre-op covering staff: - Please schedule  in office appointment and call patient to inform them. - Please contact requesting surgeon's office via preferred method (i.e, phone, fax) to inform them of need for appointment prior to surgery.  If applicable, this message will also be routed to pharmacy pool and/or primary cardiologist for input on holding anticoagulant/antiplatelet agent as requested below so that this information is available at time of patient's appointment.   Patrick Asters, NP  10/17/2023, 9:45 AM

## 2023-10-28 NOTE — H&P (Cosign Needed Addendum)
 TOTAL KNEE ADMISSION H&P  Patient is being admitted for right total knee arthroplasty.  Subjective:  Chief Complaint: Right knee pain.  HPI: Patrick Rubio, 63 y.o. male has a history of pain and functional disability in the right knee due to arthritis and has failed non-surgical conservative treatments for greater than 12 weeks to include corticosteriod injections, viscosupplementation injections, and activity modification. Onset of symptoms was gradual, starting several years ago with gradually worsening course since that time. The patient noted no past surgery on the right knee.  Patient currently rates pain in the right knee at 8 out of 10 with activity. Patient has worsening of pain with activity and weight bearing and pain that interferes with activities of daily living. Patient has evidence of subchondral sclerosis, periarticular osteophytes, and joint space narrowing by imaging studies. There is no active infection.  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   Goals of care, counseling/discussion 05/26/2019   Calculus of gallbladder without cholecystitis without obstruction    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   Right inguinal hernia    Generalized lymphadenopathy    Lymphadenopathy, mesenteric 04/02/2019   RUQ pain 03/11/2019   Dyspepsia 11/12/2018   Loss of weight 11/12/2018    Past Medical History:  Diagnosis Date   CAD (coronary artery disease)    GERD  (gastroesophageal reflux disease)    HTN (hypertension)    Hypercholesterolemia    MI (myocardial infarction) (HCC) 2003   Small lymphocytic lymphoma (HCC)    Stomach cancer (HCC)     Past Surgical History:  Procedure Laterality Date   AXILLARY LYMPH NODE BIOPSY Left 04/27/2019   Procedure: AXILLARY LYMPH NODE BIOPSY;  Surgeon: Franky Macho, MD;  Location: [facility];  Service: General;  Laterality: Left;   BIOPSY  11/26/2018   Procedure: BIOPSY;  Surgeon: Chandran Ade, MD;  Location: [facility];  Service: Endoscopy;;   BIOPSY  03/08/2022   Procedure: BIOPSY;  Surgeon: Boorman Ade, MD;  Location: [facility];  Service: Endoscopy;;   CHOLECYSTECTOMY N/A 05/18/2019   Procedure: LAPAROSCOPIC CHOLECYSTECTOMY;  Surgeon: Franky Macho, MD;  Location: [facility];  Service: General;  Laterality: N/A;   COLONOSCOPY WITH PROPOFOL N/A 03/08/2022   Procedure: COLONOSCOPY WITH PROPOFOL;  Surgeon: Marchetta Ade, MD;  [facility];  Service: Endoscopy;  Laterality: N/A;  8:45am   CORONARY ANGIOPLASTY WITH STENT PLACEMENT     2003   ESOPHAGOGASTRODUODENOSCOPY N/A 11/26/2018   mild erosive reflux esophagitis, gastric erythema. Negative H.pylori    ESOPHAGOGASTRODUODENOSCOPY (EGD) WITH PROPOFOL N/A 03/08/2022   Procedure: ESOPHAGOGASTRODUODENOSCOPY (EGD) WITH PROPOFOL;  Surgeon: Labra Ade, MD;  [facility];  Service: Endoscopy;  Laterality: N/A;   INGUINAL HERNIA REPAIR Right 04/27/2019   Procedure: HERNIA REPAIR INGUINAL ADULT;  Surgeon: Franky Macho, MD;  Location: [facility];  Service: General;  Laterality: Right;   KNEE SURGERY Right    lumbar fusion with cage  07/2017   NASAL SINUS SURGERY     TONSILLECTOMY AND ADENOIDECTOMY  Prior to Admission medications   Medication Sig Start Date End Date Taking? Authorizing Provider  acetaminophen (TYLENOL) 650 MG CR tablet Take 650 mg by mouth in the morning and at bedtime.    [provider]  aspirin EC 81 MG  tablet Take 81 mg by mouth every Monday, Wednesday, and Friday. IN THE MORNING    [provider]  baclofen (LIORESAL) 10 MG tablet Take 10 mg by mouth 2 (two) times daily. 07/09/21   [provider]  carvedilol (COREG) 6.25 MG tablet Take 6.25 mg by mouth 2 (two) times daily. 05/25/20   [provider]  celecoxib (CELEBREX) 200 MG capsule Take 200 mg by mouth in the morning and at bedtime. 01/25/22   [provider]  cholecalciferol (VITAMIN D3) 25 MCG (1000 UNIT) tablet Take 1,000 Units by mouth in the morning and at bedtime.    [provider]  dicyclomine (BENTYL) 10 MG capsule TAKE 1 CAPSULE (10 MG TOTAL) BY MOUTH 2 (TWO) TIMES DAILY AS NEEDED FOR SPASMS (DIARRHEA). 07/10/22   Tiffany Kocher, PA-C  ENTRESTO 49-51 MG Take 1 tablet by mouth 2 (two) times daily. 08/21/22   [provider]  escitalopram (LEXAPRO) 20 MG tablet Take 20 mg by mouth in the morning. 10/06/18   [provider]  gabapentin (NEURONTIN) 300 MG capsule Take 300 mg by mouth 2 (two) times daily. 06/28/21   [provider]  nitroGLYCERIN (NITROSTAT) 0.4 MG SL tablet Place 0.4 mg under the tongue every 5 (five) minutes x 3 doses as needed for chest pain.    [provider]  pantoprazole (PROTONIX) 40 MG tablet TAKE 1 TABLET BY MOUTH EVERY DAY 07/25/23   Tiffany Kocher, PA-C  Pitavastatin Calcium (LIVALO) 4 MG TABS Take 4 mg by mouth every evening.    [provider]  prasugrel (EFFIENT) 10 MG TABS tablet Take 10 mg by mouth in the morning. 10/02/19   [provider]    No Known Allergies  Social History   Socioeconomic History   Marital status: Divorced    Spouse name: Not on file   Number of children: 2   Years of education: Not on file   Highest education level: Not on file  Occupational History   Occupation: employed    Comment: part time at [employer] home  Tobacco Use   Smoking status: Every Day    Current packs/day:  0.50    Average packs/day: 0.5 packs/day for 25.0 years (12.5 ttl pk-yrs)    Types: Cigarettes   Smokeless tobacco: Never  Vaping Use   Vaping status: Never Used  Substance and Sexual Activity   Alcohol use: Yes    Comment: occ   Drug use: Never   Sexual activity: Yes  Other Topics Concern   Not on file  Social History Narrative   Not on file   Social Drivers of Health   Financial Resource Strain: Low Risk  (06/22/2020)   Overall Financial Resource Strain (CARDIA)    Difficulty of Paying Living Expenses: Not hard at all  Food Insecurity: No Food Insecurity (06/22/2020)   Hunger Vital Sign    Worried About Running Out of Food in the Last Year: Never true    Ran Out of Food in the Last Year: Never true  Transportation Needs: No Transportation Needs (06/22/2020)   PRAPARE - Administrator, Civil Service (Medical): No    Lack of Transportation (Non-Medical): No  Physical Activity: Inactive (06/22/2020)  Exercise Vital Sign    Days of Exercise per Week: 0 days    Minutes of Exercise per Session: 0 min  Stress: No Stress Concern Present (06/22/2020)   [employer] of Occupational Health - Occupational Stress Questionnaire    Feeling of Stress : Not at all  Social Connections: Socially Isolated (06/22/2020)   Social Connection and Isolation Panel [NHANES]    Frequency of Communication with Friends and Family: More than three times a week    Frequency of Social Gatherings with Friends and Family: More than three times a week    Attends Religious Services: Never    Database administrator or Organizations: No    Attends Banker Meetings: Never    Marital Status: Divorced  Catering manager Violence: Not At Risk (06/22/2020)   Humiliation, Afraid, Rape, and Kick questionnaire    Fear of Current or Ex-Partner: No    Emotionally Abused: No    Physically Abused: No    Sexually Abused: No    Tobacco Use: High Risk (09/03/2023)   Patient History     Smoking Tobacco Use: Every Day    Smokeless Tobacco Use: Never    Passive Exposure: Not on file   Social History   Substance and Sexual Activity  Alcohol Use Yes   Comment: occ    Family History  Problem Relation Age of Onset   Colon polyps Father        thinks he may have had polyps, possibly in his 20s.    Heart attack Father    Dementia Mother    Hypotension Mother    Stroke Sister    Diabetes Maternal Grandmother    Stroke Maternal Grandfather    Cancer Paternal Grandmother    Congestive Heart Failure Paternal Grandfather    Kidney cancer Daughter    Colon cancer Neg Hx    Pancreatic cancer Neg Hx    Liver disease Neg Hx     ROS  Objective:  Physical Exam: - Well-developed male alert and oriented in no apparent distress    - Right hip shows normal range of motion, no discomfort.    - Right knee shows no effusion. Range of motion is about 5-125 with moderate crepitus on range of motion. There is tenderness medially also with slight lateral tenderness. There is no instability noted.    - Left knee, no effusion. Range of motion is 0-125. Moderate crepitus on range of motion. Tender medially with some slight lateral tenderness also and no instability noted.    - He has a significant antalgic gait pattern on the right.    IMAGING:  Radiographs of both knees, including AP and lateral views, demonstrate bone-on-bone arthritis in the medial and patellofemoral compartments of both knees. The right knee shows slightly worse changes than the left, with big bone spurs behind the patella and near bone on bone behind the patella  Assessment/Plan:  End stage arthritis, right knee   The patient history, physical examination, clinical judgment of the provider and imaging studies are consistent with end stage degenerative joint disease of the right knee and total knee arthroplasty is deemed medically necessary. The treatment options including medical management, injection therapy  arthroscopy and arthroplasty were discussed at length. The risks and benefits of total knee arthroplasty were presented and reviewed. The risks due to aseptic loosening, infection, stiffness, patella tracking problems, thromboembolic complications and other imponderables were discussed. The patient acknowledged the explanation, agreed to proceed with the plan and  consent was signed. Patient is being admitted for inpatient treatment for surgery, pain control, PT, OT, prophylactic antibiotics, VTE prophylaxis, progressive ambulation and ADLs and discharge planning. The patient is planning to be discharged home.   Patient's anticipated LOS is less than 2 midnights, meeting these requirements: - Younger than 32 - Lives within 1 hour of care - Has a competent adult at home to recover with post-op recover - NO history of  - Chronic pain requiring opiods  - Diabetes  - Heart failure  - Stroke  - DVT/VTE  - Cardiac arrhythmia  - Respiratory Failure/COPD  - Renal failure  - Anemia  - Advanced Liver disease   Therapy Plans: [facility] in [place], [place] Disposition: Home with girlfriend who is a nurse Planned DVT Prophylaxis: Prasugrel + ASA 81mg  BID DME Needed: None PCP: Argentina Ponder, FNP (clearance received) Cardiologist: Daryel November, MD (clearance received) TXA: IV Allergies: NKDA Anesthesia Concerns: Required breathing treatment when waking from back surgery BMI: 29 Last HgbA1c: Not diabetic [facility] [address] in [place]   Other: -Stop Prasugrel 5 days before surgery. Continue ASA through perioperative period per cardiologist. Currently taking ASA M-W-F -Cardiac stents x 3, most recently placed approx 3 years ago -Pt states his resting HR is in the high 40s low 50s   - Patient was instructed on what medications to stop prior to surgery. - Follow-up visit in 2 weeks with Dr. Lequita Halt - Begin physical therapy following surgery - Pre-operative lab work as pre-surgical  testing - Prescriptions will be provided in hospital at time of discharge  Weston Brass, PA-C Orthopedic Surgery [facility] [place]

## 2023-10-30 NOTE — Progress Notes (Addendum)
 Anesthesia Review:  PCP: [place] Ponder with [facility]  Clearance dated 08/15/23 in Media  Cardiologist : DR Daryel November  - requested ov note , EKG, stress and etc on 11/04/23  OV dated 07/10/23 under media tab dated 11/04/23.  Clearance dated  10/22/23 in Medial Tab   PPM/ ICD: Device Orders: Rep Notified:  Chest x-ray : EKG :- 07/21/23- media tab dated 11/04/23  Echo : 09/20/22- under Media tab dated 11/04/22  Stress test: 02/18/23- Media tab dated 11/04/23  Cardiac Cath :   Activity level: can do a flight of stairs without difficulty   Sleep Study/ CPAP : none  Fasting Blood Sugar :      / Checks Blood Sugar -- times a day:    Blood Thinner/ Instructions /Last Dose: ASA / Instructions/ Last Dose :    81 mg aspirin  Prasugrel - last dose on  11/05/2023 per pt

## 2023-10-30 NOTE — Patient Instructions (Signed)
 SURGICAL WAITING ROOM VISITATION  Patients having surgery or a procedure may have no more than 2 support people in the waiting area - these visitors may rotate.    Children under the age of 46 must have an adult with them who is not the patient.  Due to an increase in RSV and influenza rates and associated hospitalizations, children ages 58 and under may not visit patients in [facility] hospitals.  Visitors with respiratory illnesses are discouraged from visiting and should remain at home.  If the patient needs to stay at the hospital during part of their recovery, the visitor guidelines for inpatient rooms apply. Pre-op nurse will coordinate an appropriate time for 1 support person to accompany patient in pre-op.  This support person may not rotate.    Please refer to the [facility] website for the visitor guidelines for Inpatients (after your surgery is over and you are in a regular room).       Your procedure is scheduled on:  11/11/2023    Report to [facility] Main Entrance    Report to admitting at  0800 AM   Call this number if you have problems the morning of surgery [phone]   Do not eat food :After Midnight.   After Midnight you may have the following liquids until __ 0730____ AM DAY OF SURGERY  Water Non-Citrus Juices (without pulp, NO RED-Apple, White grape, White cranberry) Black Coffee (NO MILK/CREAM OR CREAMERS, sugar ok)  Clear Tea (NO MILK/CREAM OR CREAMERS, sugar ok) regular and decaf                             Plain Jell-O (NO RED)                                           Fruit ices (not with fruit pulp, NO RED)                                     Popsicles (NO RED)                                                               Sports drinks like Gatorade (NO RED)                    The day of surgery:  Drink ONE (1) Pre-Surgery Clear Ensure or G2 at 0730 AM ( have completed by )  the morning of surgery. Drink in one sitting. Do not sip.   This drink was given to you during your hospital  pre-op appointment visit. Nothing else to drink after completing the  Pre-Surgery Clear Ensure or G2.          If you have questions, please contact your surgeon's office.      Oral Hygiene is also important to reduce your risk of infection.  Remember - BRUSH YOUR TEETH THE MORNING OF SURGERY WITH YOUR REGULAR TOOTHPASTE  DENTURES WILL BE REMOVED PRIOR TO SURGERY PLEASE DO NOT APPLY "Poly grip" OR ADHESIVES!!!   Do NOT smoke after Midnight   Stop all vitamins and herbal supplements 7 days before surgery.   Take these medicines the morning of surgery with A SIP OF WATER:  coreg, lexapro, gabapentin, protonix   DO NOT TAKE ANY ORAL DIABETIC MEDICATIONS DAY OF YOUR SURGERY  Bring CPAP mask and tubing day of surgery.                              You may not have any metal on your body including hair pins, jewelry, and body piercing             Do not wear make-up, lotions, powders, perfumes/cologne, or deodorant  Do not wear nail polish including gel and S&S, artificial/acrylic nails, or any other type of covering on natural nails including finger and toenails. If you have artificial nails, gel coating, etc. that needs to be removed by a nail salon please have this removed prior to surgery or surgery may need to be canceled/ delayed if the surgeon/ anesthesia feels like they are unable to be safely monitored.   Do not shave  48 hours prior to surgery.               Men may shave face and neck.   Do not bring valuables to the hospital. [facility] IS NOT             RESPONSIBLE   FOR VALUABLES.   Contacts, glasses, dentures or bridgework may not be worn into surgery.   Bring small overnight bag day of surgery.   DO NOT BRING YOUR HOME MEDICATIONS TO THE HOSPITAL. PHARMACY WILL DISPENSE MEDICATIONS LISTED ON YOUR MEDICATION LIST TO YOU DURING YOUR ADMISSION IN THE HOSPITAL!    Patients  discharged on the day of surgery will not be allowed to drive home.  Someone NEEDS to stay with you for the first 24 hours after anesthesia.   Special Instructions: Bring a copy of your healthcare power of attorney and living will documents the day of surgery if you haven't scanned them before.              Please read over the following fact sheets you were given: IF YOU HAVE QUESTIONS ABOUT YOUR PRE-OP INSTRUCTIONS PLEASE CALL [phone]   If you received a COVID test during your pre-op visit  it is requested that you wear a mask when out in public, stay away from anyone that may not be feeling well and notify your surgeon if you develop symptoms. If you test positive for Covid or have been in contact with anyone that has tested positive in the last 10 days please notify you surgeon.      Pre-operative 5 CHG Bath Instructions   You can play a key role in reducing the risk of infection after surgery. Your skin needs to be as free of germs as possible. You can reduce the number of germs on your skin by washing with CHG (chlorhexidine gluconate) soap before surgery. CHG is an antiseptic soap that kills germs and continues to kill germs even after washing.   DO NOT use if you have an allergy to chlorhexidine/CHG or antibacterial soaps. If your skin becomes reddened or irritated, stop using the CHG and notify one of  our RNs at [phone].   Please shower with the CHG soap starting 4 days before surgery using the following schedule:     Please keep in mind the following:  DO NOT shave, including legs and underarms, starting the day of your first shower.   You may shave your face at any point before/day of surgery.  Place clean sheets on your bed the day you start using CHG soap. Use a clean washcloth (not used since being washed) for each shower. DO NOT sleep with pets once you start using the CHG.   CHG Shower Instructions:  If you choose to wash your hair and private area, wash first  with your normal shampoo/soap.  After you use shampoo/soap, rinse your hair and body thoroughly to remove shampoo/soap residue.  Turn the water OFF and apply about 3 tablespoons (45 ml) of CHG soap to a CLEAN washcloth.  Apply CHG soap ONLY FROM YOUR NECK DOWN TO YOUR TOES (washing for 3-5 minutes)  DO NOT use CHG soap on face, private areas, open wounds, or sores.  Pay special attention to the area where your surgery is being performed.  If you are having back surgery, having someone wash your back for you may be helpful. Wait 2 minutes after CHG soap is applied, then you may rinse off the CHG soap.  Pat dry with a clean towel  Put on clean clothes/pajamas   If you choose to wear lotion, please use ONLY the CHG-compatible lotions on the back of this paper.     Additional instructions for the day of surgery: DO NOT APPLY any lotions, deodorants, cologne, or perfumes.   Put on clean/comfortable clothes.  Brush your teeth.  Ask your nurse before applying any prescription medications to the skin.      CHG Compatible Lotions   Aveeno Moisturizing lotion  Cetaphil Moisturizing Cream  Cetaphil Moisturizing Lotion  Clairol Herbal Essence Moisturizing Lotion, Dry Skin  Clairol Herbal Essence Moisturizing Lotion, Extra Dry Skin  Clairol Herbal Essence Moisturizing Lotion, Normal Skin  Curel Age Defying Therapeutic Moisturizing Lotion with Alpha Hydroxy  Curel Extreme Care Body Lotion  Curel Soothing Hands Moisturizing Hand Lotion  Curel Therapeutic Moisturizing Cream, Fragrance-Free  Curel Therapeutic Moisturizing Lotion, Fragrance-Free  Curel Therapeutic Moisturizing Lotion, Original Formula  Eucerin Daily Replenishing Lotion  Eucerin Dry Skin Therapy Plus Alpha Hydroxy Crme  Eucerin Dry Skin Therapy Plus Alpha Hydroxy Lotion  Eucerin Original Crme  Eucerin Original Lotion  Eucerin Plus Crme Eucerin Plus Lotion  Eucerin TriLipid Replenishing Lotion  Keri Anti-Bacterial Hand  Lotion  Keri Deep Conditioning Original Lotion Dry Skin Formula Softly Scented  Keri Deep Conditioning Original Lotion, Fragrance Free Sensitive Skin Formula  Keri Lotion Fast Absorbing Fragrance Free Sensitive Skin Formula  Keri Lotion Fast Absorbing Softly Scented Dry Skin Formula  Keri Original Lotion  Keri Skin Renewal Lotion Keri Silky Smooth Lotion  Keri Silky Smooth Sensitive Skin Lotion  Nivea Body Creamy Conditioning Oil  Nivea Body Extra Enriched Teacher, adult education Moisturizing Lotion Nivea Crme  Nivea Skin Firming Lotion  NutraDerm 30 Skin Lotion  NutraDerm Skin Lotion  NutraDerm Therapeutic Skin Cream  NutraDerm Therapeutic Skin Lotion  ProShield Protective Hand Cream  Provon moisturizing lotion

## 2023-11-04 ENCOUNTER — Encounter (HOSPITAL_COMMUNITY)
Admission: RE | Admit: 2023-11-04 | Discharge: 2023-11-04 | Disposition: A | Discharge status: Home / Self Care | Source: Ambulatory Visit | Attending: Orthopedic Surgery | Admitting: Orthopedic Surgery

## 2023-11-04 ENCOUNTER — Encounter (HOSPITAL_COMMUNITY): Payer: Self-pay

## 2023-11-04 ENCOUNTER — Other Ambulatory Visit: Payer: Self-pay

## 2023-11-04 VITALS — BP 128/85 | HR 55 | Temp 98.2°F | Resp 16 | Ht 70.0 in | Wt 207.0 lb

## 2023-11-04 DIAGNOSIS — Z955 Presence of coronary angioplasty implant and graft: Secondary | ICD-10-CM | POA: Insufficient documentation

## 2023-11-04 DIAGNOSIS — Z8572 Personal history of non-Hodgkin lymphomas: Secondary | ICD-10-CM | POA: Diagnosis not present

## 2023-11-04 DIAGNOSIS — I2510 Atherosclerotic heart disease of native coronary artery without angina pectoris: Secondary | ICD-10-CM | POA: Diagnosis not present

## 2023-11-04 DIAGNOSIS — Z01818 Encounter for other preprocedural examination: Secondary | ICD-10-CM | POA: Insufficient documentation

## 2023-11-04 DIAGNOSIS — E785 Hyperlipidemia, unspecified: Secondary | ICD-10-CM | POA: Insufficient documentation

## 2023-11-04 DIAGNOSIS — F17200 Nicotine dependence, unspecified, uncomplicated: Secondary | ICD-10-CM | POA: Diagnosis not present

## 2023-11-04 DIAGNOSIS — I255 Ischemic cardiomyopathy: Secondary | ICD-10-CM | POA: Diagnosis not present

## 2023-11-04 DIAGNOSIS — Z9221 Personal history of antineoplastic chemotherapy: Secondary | ICD-10-CM | POA: Diagnosis not present

## 2023-11-04 DIAGNOSIS — I10 Essential (primary) hypertension: Secondary | ICD-10-CM | POA: Diagnosis not present

## 2023-11-04 HISTORY — DX: Personal history of urinary calculi: Z87.442

## 2023-11-04 HISTORY — DX: Unspecified osteoarthritis, unspecified site: M19.90

## 2023-11-04 HISTORY — DX: Personal history of antineoplastic chemotherapy: Z92.21

## 2023-11-04 HISTORY — DX: Other complications of anesthesia, initial encounter: T88.59XA

## 2023-11-04 LAB — CBC
HCT: 40.3 % (ref 39.0–52.0)
Hemoglobin: 13.3 g/dL (ref 13.0–17.0)
MCH: 31.1 pg (ref 26.0–34.0)
MCHC: 33 g/dL (ref 30.0–36.0)
MCV: 94.4 fL (ref 80.0–100.0)
Platelets: 163 10*3/uL (ref 150–400)
RBC: 4.27 MIL/uL (ref 4.22–5.81)
RDW: 13 % (ref 11.5–15.5)
WBC: 8.4 10*3/uL (ref 4.0–10.5)
nRBC: 0 % (ref 0.0–0.2)

## 2023-11-04 LAB — BASIC METABOLIC PANEL WITH GFR
Anion gap: 8 (ref 5–15)
BUN: 16 mg/dL (ref 8–23)
CO2: 24 mmol/L (ref 22–32)
Calcium: 8.8 mg/dL — ABNORMAL LOW (ref 8.9–10.3)
Chloride: 106 mmol/L (ref 98–111)
Creatinine, Ser: 0.59 mg/dL — ABNORMAL LOW (ref 0.61–1.24)
GFR, Estimated: >60 mL/min (ref >60)
Glucose, Bld: 84 mg/dL (ref 70–99)
Potassium: 3.8 mmol/L (ref 3.5–5.1)
Sodium: 138 mmol/L (ref 135–145)

## 2023-11-04 LAB — SURGICAL PCR SCREEN
MRSA, PCR: NEGATIVE
Staphylococcus aureus: NEGATIVE

## 2023-11-05 NOTE — Anesthesia Preprocedure Evaluation (Addendum)
 Anesthesia Evaluation  Patient identified by MRN, date of birth, ID band Patient awake    Reviewed: Allergy & Precautions, NPO status , Patient's Chart, lab work & pertinent test results  Airway Mallampati: I  TM Distance: >3 FB Neck ROM: Full    Dental no notable dental hx.    Pulmonary Current Smoker and Patient abstained from smoking.   Pulmonary exam normal        Cardiovascular hypertension, Pt. on home beta blockers + CAD, + Past MI, + Cardiac Stents (x 3) and +CHF  Normal cardiovascular exam     Neuro/Psych  PSYCHIATRIC DISORDERS      negative neurological ROS     GI/Hepatic Neg liver ROS,GERD  Medicated and Controlled,,  Endo/Other  negative endocrine ROS    Renal/GU negative Renal ROS     Musculoskeletal  (+) Arthritis ,  Lumbar spine surgery x 2   Abdominal   Peds  Hematology negative hematology ROS (+)   Anesthesia Other Findings   Reproductive/Obstetrics                             Anesthesia Physical Anesthesia Plan  ASA: 2  Anesthesia Plan: General and Regional   Post-op Pain Management: Regional block*   Induction: Intravenous  PONV Risk Score and Plan: 1 and Ondansetron, Dexamethasone, Midazolam and Treatment may vary due to age or medical condition  Airway Management Planned: LMA  Additional Equipment:   Intra-op Plan:   Post-operative Plan: Extubation in OR  Informed Consent: I have reviewed the patients History and Physical, chart, labs and discussed the procedure including the risks, benefits and alternatives for the proposed anesthesia with the patient or authorized representative who has indicated his/her understanding and acceptance.     Dental advisory given  Plan Discussed with: CRNA  Anesthesia Plan Comments: (PAT note by Rudy Costain, PA-C: 63 year old male current smoker follows with cardiologist Dr. Annabell Key in [facility] for history of HTN,  HLD, PVCs, ischemic cardiomyopathy, CAD - cardiac catheterization on 10/01/2019 found to have 100% occlusion of the RCA. -angioplasty and stenting of the circumflex was done.  Nuclear stress 02/18/2023 was low risk.  Last seen by Dr. Annabell Key on 07/10/2023, stable from cardiac standpoint, no changes to management.  He did subsequently cleared patient for TKA, copy of clearance scanned in media.  Follows with oncologist Dr. 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  PET scan 05/2020 did not show any evidence of lymphoma.  Last seen by Dr. Cheree Cords on 09/03/2023, doing well at that time, normal labs, recommended follow-up in 6 months.  Discussed he would be having knee replacement in April.  Patient reports last dose of prasugrel 11/05/2023.  Preop labs reviewed and unremarkable.  EKG 07/10/2023 (copy scanned into media): Sinus rhythm with bigeminal premature ventricular contractions at a rate of 62 first with degree AV block.  Nuclear stress test 02/18/2023 (copy scanned into media): Impressions: The perfusion study is equivocal.  No perfusion defect seen on imaging. The patient experienced SOB, lightheaded. Positive stress-induced PVCs. Physiologic blood pressure response. No infusion induced EKG changes. SPECT nuclear imaging demonstrates no evidence of ischemia. No evidence of infarct. Paradoxical septal motion with dilated LV. Calculated LVEF: 40 to 45%. Recommendations/conclusions: Low risk preoperative study.  Paradoxical septal motion with dilated LV.  Summed rest = 2, summed stress = 1. TID index 1.08.  No significant ischemic perfusion defect  noted.  TTE 09/13/2022 (copy scanned into media): Conclusions: Normal left ventricular systolic function.  LVEF: 50 to 55%. Normal right ventricular function.  TAPSE normal. Mild to moderate left ventricular hypertrophy. Stage I diastolic dysfunction. Cardiac chamber imaging  demonstrates moderate left ventricular enlargement, mild right ventricular enlargement, and mild right atrial enlargement. Valvular and Doppler imaging demonstrates trace to mild aortic insufficiency, mild mitral regurgitation, mild pulmonic insufficiency, and mild tricuspid regurgitation with Doppler evidence of mild pulmonary hypertension. Mildly elevated estimated PA systolic pressure. No pericardial effusion. No other significant findings.   )        Anesthesia Quick Evaluation

## 2023-11-05 NOTE — Progress Notes (Signed)
 Anesthesia Chart Review:  63 year old male current smoker follows with cardiologist Dr. Hyacinth Meeker in [facility] for history of HTN, HLD, PVCs, ischemic cardiomyopathy, CAD - cardiac catheterization on 10/01/2019 found to have 100% occlusion of the RCA. -angioplasty and stenting of the circumflex was done.  Nuclear stress 02/18/2023 was low risk.  Last seen by Dr. Hyacinth Meeker on 07/10/2023, stable from cardiac standpoint, no changes to management.  He did subsequently cleared patient for TKA, copy of clearance scanned in media.  Follows with oncologist Dr. 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  PET scan 05/2020 did not show any evidence of lymphoma.  Last seen by Dr. Ellin Saba on 09/03/2023, doing well at that time, normal labs, recommended follow-up in 6 months.  Discussed he would be having knee replacement in April.  Patient reports last dose of prasugrel 11/05/2023.  Preop labs reviewed and unremarkable.  EKG 07/10/2023 (copy scanned into media): Sinus rhythm with bigeminal premature ventricular contractions at a rate of 62 first with degree AV block.  Nuclear stress test 02/18/2023 (copy scanned into media): Impressions: The perfusion study is equivocal.  No perfusion defect seen on imaging. The patient experienced SOB, lightheaded. Positive stress-induced PVCs. Physiologic blood pressure response. No infusion induced EKG changes. SPECT nuclear imaging demonstrates no evidence of ischemia. No evidence of infarct. Paradoxical septal motion with dilated LV. Calculated LVEF: 40 to 45%. Recommendations/conclusions: Low risk preoperative study.  Paradoxical septal motion with dilated LV.  Summed rest = 2, summed stress = 1. TID index 1.08.  No significant ischemic perfusion defect noted.  TTE 09/13/2022 (copy scanned into media): Conclusions: Normal left ventricular systolic function.  LVEF: 50 to 55%. Normal right ventricular  function.  TAPSE normal. Mild to moderate left ventricular hypertrophy. Stage I diastolic dysfunction. Cardiac chamber imaging demonstrates moderate left ventricular enlargement, mild right ventricular enlargement, and mild right atrial enlargement. Valvular and Doppler imaging demonstrates trace to mild aortic insufficiency, mild mitral regurgitation, mild pulmonic insufficiency, and mild tricuspid regurgitation with Doppler evidence of mild pulmonary hypertension. Mildly elevated estimated PA systolic pressure. No pericardial effusion. No other significant findings.    Zannie Cove [facility] Short Stay Center/Anesthesiology Phone [phone] 11/05/2023 12:25 PM

## 2023-11-11 ENCOUNTER — Other Ambulatory Visit: Payer: Self-pay

## 2023-11-11 ENCOUNTER — Encounter (HOSPITAL_COMMUNITY): Payer: Self-pay | Admitting: Orthopedic Surgery

## 2023-11-11 ENCOUNTER — Encounter (HOSPITAL_COMMUNITY)
Admission: RE | Disposition: A | Discharge status: Home / Self Care | Payer: Self-pay | Source: Home / Self Care | Attending: Orthopedic Surgery

## 2023-11-11 ENCOUNTER — Ambulatory Visit (HOSPITAL_COMMUNITY): Payer: Self-pay | Admitting: Physician Assistant

## 2023-11-11 ENCOUNTER — Observation Stay (HOSPITAL_COMMUNITY)
Admission: RE | Admit: 2023-11-11 | Discharge: 2023-11-12 | Disposition: A | Discharge status: Home / Self Care | Payer: 59 | Attending: Orthopedic Surgery | Admitting: Orthopedic Surgery

## 2023-11-11 ENCOUNTER — Ambulatory Visit (HOSPITAL_COMMUNITY): Admitting: Anesthesiology

## 2023-11-11 DIAGNOSIS — M1711 Unilateral primary osteoarthritis, right knee: Secondary | ICD-10-CM | POA: Diagnosis present

## 2023-11-11 DIAGNOSIS — Z79899 Other long term (current) drug therapy: Secondary | ICD-10-CM | POA: Diagnosis not present

## 2023-11-11 DIAGNOSIS — I5042 Chronic combined systolic (congestive) and diastolic (congestive) heart failure: Secondary | ICD-10-CM | POA: Diagnosis not present

## 2023-11-11 DIAGNOSIS — Z01818 Encounter for other preprocedural examination: Secondary | ICD-10-CM

## 2023-11-11 DIAGNOSIS — Z85028 Personal history of other malignant neoplasm of stomach: Secondary | ICD-10-CM | POA: Diagnosis not present

## 2023-11-11 DIAGNOSIS — I110 Hypertensive heart disease with heart failure: Secondary | ICD-10-CM | POA: Diagnosis not present

## 2023-11-11 DIAGNOSIS — F17210 Nicotine dependence, cigarettes, uncomplicated: Secondary | ICD-10-CM | POA: Insufficient documentation

## 2023-11-11 DIAGNOSIS — Z955 Presence of coronary angioplasty implant and graft: Secondary | ICD-10-CM | POA: Diagnosis not present

## 2023-11-11 DIAGNOSIS — M179 Osteoarthritis of knee, unspecified: Principal | ICD-10-CM | POA: Diagnosis present

## 2023-11-11 DIAGNOSIS — I2510 Atherosclerotic heart disease of native coronary artery without angina pectoris: Secondary | ICD-10-CM | POA: Insufficient documentation

## 2023-11-11 HISTORY — PX: TOTAL KNEE ARTHROPLASTY: SHX125

## 2023-11-11 SURGERY — ARTHROPLASTY, KNEE, TOTAL
Anesthesia: Regional | Site: Knee | Laterality: Right

## 2023-11-11 MED ORDER — ONDANSETRON HCL 4 MG/2ML IJ SOLN: 4.0000 mg | Freq: Four times a day (QID) | INTRAMUSCULAR | Status: DC | PRN

## 2023-11-11 MED ORDER — ORAL CARE MOUTH RINSE: 15.0000 mL | Freq: Once | OROMUCOSAL | Status: AC

## 2023-11-11 MED ORDER — AMISULPRIDE (ANTIEMETIC) 5 MG/2ML IV SOLN: 10.0000 mg | Freq: Once | INTRAVENOUS | Status: DC | PRN

## 2023-11-11 MED ORDER — FENTANYL CITRATE PF 50 MCG/ML IJ SOSY
PREFILLED_SYRINGE | INTRAMUSCULAR | Status: AC
  Administered 2023-11-11: 50 ug via INTRAVENOUS
  Filled 2023-11-11: qty 2

## 2023-11-11 MED ORDER — OXYCODONE HCL 5 MG PO TABS
10.0000 mg | ORAL_TABLET | ORAL | Status: DC | PRN
  Administered 2023-11-11: 10 mg via ORAL

## 2023-11-11 MED ORDER — CARVEDILOL 6.25 MG PO TABS
6.2500 mg | ORAL_TABLET | Freq: Two times a day (BID) | ORAL | Status: DC
  Administered 2023-11-11 – 2023-11-12 (×2): 6.25 mg via ORAL
  Filled 2023-11-11 (×2): qty 1

## 2023-11-11 MED ORDER — 0.9 % SODIUM CHLORIDE (POUR BTL) OPTIME
TOPICAL | Status: DC | PRN
  Administered 2023-11-11: 1000 mL

## 2023-11-11 MED ORDER — METOCLOPRAMIDE HCL 5 MG/ML IJ SOLN: 5.0000 mg | Freq: Three times a day (TID) | INTRAMUSCULAR | Status: DC | PRN

## 2023-11-11 MED ORDER — SODIUM CHLORIDE 0.9 % IR SOLN
Status: DC | PRN
  Administered 2023-11-11: 1000 mL

## 2023-11-11 MED ORDER — GABAPENTIN 300 MG PO CAPS
300.0000 mg | ORAL_CAPSULE | Freq: Three times a day (TID) | ORAL | Status: DC
  Administered 2023-11-11 – 2023-11-12 (×3): 300 mg via ORAL
  Filled 2023-11-11 (×3): qty 1

## 2023-11-11 MED ORDER — FENTANYL CITRATE (PF) 100 MCG/2ML IJ SOLN
INTRAMUSCULAR | Status: AC
  Filled 2023-11-11: qty 2

## 2023-11-11 MED ORDER — BUPIVACAINE-EPINEPHRINE (PF) 0.5% -1:200000 IJ SOLN
INTRAMUSCULAR | Status: DC | PRN
  Administered 2023-11-11: 30 mL via PERINEURAL

## 2023-11-11 MED ORDER — PHENOL 1.4 % MT LIQD: 1.0000 | OROMUCOSAL | Status: DC | PRN

## 2023-11-11 MED ORDER — BISACODYL 10 MG RE SUPP: 10.0000 mg | Freq: Every day | RECTAL | Status: DC | PRN

## 2023-11-11 MED ORDER — BUPIVACAINE LIPOSOME 1.3 % IJ SUSP
INTRAMUSCULAR | Status: AC
  Filled 2023-11-11: qty 20

## 2023-11-11 MED ORDER — PRAVASTATIN SODIUM 20 MG PO TABS
80.0000 mg | ORAL_TABLET | Freq: Every day | ORAL | Status: DC
  Administered 2023-11-11: 80 mg via ORAL
  Filled 2023-11-11: qty 4

## 2023-11-11 MED ORDER — SACUBITRIL-VALSARTAN 49-51 MG PO TABS
1.0000 | ORAL_TABLET | Freq: Two times a day (BID) | ORAL | Status: DC
  Administered 2023-11-11 – 2023-11-12 (×2): 1 via ORAL
  Filled 2023-11-11 (×2): qty 1

## 2023-11-11 MED ORDER — LACTATED RINGERS IV SOLN: INTRAVENOUS | Status: DC

## 2023-11-11 MED ORDER — OXYCODONE HCL 5 MG PO TABS
5.0000 mg | ORAL_TABLET | ORAL | Status: DC | PRN
  Administered 2023-11-11 – 2023-11-12 (×3): 10 mg via ORAL
  Filled 2023-11-11 (×5): qty 2

## 2023-11-11 MED ORDER — OXYCODONE HCL 5 MG/5ML PO SOLN: 5.0000 mg | Freq: Once | ORAL | Status: AC | PRN

## 2023-11-11 MED ORDER — ASPIRIN 325 MG PO TBEC
325.0000 mg | DELAYED_RELEASE_TABLET | Freq: Two times a day (BID) | ORAL | Status: DC
  Administered 2023-11-12: 325 mg via ORAL
  Filled 2023-11-11: qty 1

## 2023-11-11 MED ORDER — ACETAMINOPHEN 10 MG/ML IV SOLN
1000.0000 mg | Freq: Four times a day (QID) | INTRAVENOUS | Status: DC
  Administered 2023-11-11: 1000 mg via INTRAVENOUS
  Filled 2023-11-11: qty 100

## 2023-11-11 MED ORDER — LIDOCAINE HCL (CARDIAC) PF 100 MG/5ML IV SOSY
PREFILLED_SYRINGE | INTRAVENOUS | Status: DC | PRN
  Administered 2023-11-11: 50 mg via INTRAVENOUS

## 2023-11-11 MED ORDER — POLYETHYLENE GLYCOL 3350 17 G PO PACK: 17.0000 g | PACK | Freq: Every day | ORAL | Status: DC | PRN

## 2023-11-11 MED ORDER — POVIDONE-IODINE 10 % EX SWAB
2.0000 | Freq: Once | CUTANEOUS | Status: AC
Start: 2023-11-11 — End: 2023-11-11
  Administered 2023-11-11: 2 via TOPICAL

## 2023-11-11 MED ORDER — PRASUGREL HCL 10 MG PO TABS
10.0000 mg | ORAL_TABLET | Freq: Every morning | ORAL | Status: DC
  Administered 2023-11-12: 10 mg via ORAL
  Filled 2023-11-11: qty 1

## 2023-11-11 MED ORDER — FENTANYL CITRATE PF 50 MCG/ML IJ SOSY
25.0000 ug | PREFILLED_SYRINGE | INTRAMUSCULAR | Status: DC | PRN
  Administered 2023-11-11: 50 ug via INTRAVENOUS

## 2023-11-11 MED ORDER — METHOCARBAMOL 1000 MG/10ML IJ SOLN: 500.0000 mg | Freq: Four times a day (QID) | INTRAMUSCULAR | Status: DC | PRN

## 2023-11-11 MED ORDER — GLYCOPYRROLATE 0.2 MG/ML IJ SOLN
INTRAMUSCULAR | Status: DC | PRN
  Administered 2023-11-11: .2 mg via INTRAVENOUS

## 2023-11-11 MED ORDER — ONDANSETRON HCL 4 MG/2ML IJ SOLN
INTRAMUSCULAR | Status: DC | PRN
  Administered 2023-11-11: 4 mg via INTRAVENOUS

## 2023-11-11 MED ORDER — PANTOPRAZOLE SODIUM 40 MG PO TBEC
40.0000 mg | DELAYED_RELEASE_TABLET | Freq: Every day | ORAL | Status: DC
Start: 2023-11-12 — End: 2023-11-12
  Administered 2023-11-12: 40 mg via ORAL
  Filled 2023-11-11: qty 1

## 2023-11-11 MED ORDER — FENTANYL CITRATE PF 50 MCG/ML IJ SOSY
50.0000 ug | PREFILLED_SYRINGE | Freq: Once | INTRAMUSCULAR | Status: DC
  Filled 2023-11-11: qty 2

## 2023-11-11 MED ORDER — EPHEDRINE 5 MG/ML INJ
INTRAVENOUS | Status: AC
  Filled 2023-11-11: qty 5

## 2023-11-11 MED ORDER — HYDRALAZINE HCL 20 MG/ML IJ SOLN
INTRAMUSCULAR | Status: AC
  Filled 2023-11-11: qty 1

## 2023-11-11 MED ORDER — METOCLOPRAMIDE HCL 5 MG PO TABS: 5.0000 mg | ORAL_TABLET | Freq: Three times a day (TID) | ORAL | Status: DC | PRN

## 2023-11-11 MED ORDER — MIDAZOLAM HCL 2 MG/2ML IJ SOLN
INTRAMUSCULAR | Status: AC
  Filled 2023-11-11: qty 2

## 2023-11-11 MED ORDER — SODIUM CHLORIDE 0.9 % IV SOLN
INTRAVENOUS | Status: DC | PRN
  Administered 2023-11-11: 60 mL

## 2023-11-11 MED ORDER — FLEET ENEMA RE ENEM: 1.0000 | ENEMA | Freq: Once | RECTAL | Status: DC | PRN

## 2023-11-11 MED ORDER — DOCUSATE SODIUM 100 MG PO CAPS
100.0000 mg | ORAL_CAPSULE | Freq: Two times a day (BID) | ORAL | Status: DC
  Administered 2023-11-11 – 2023-11-12 (×3): 100 mg via ORAL
  Filled 2023-11-11 (×3): qty 1

## 2023-11-11 MED ORDER — BUPIVACAINE LIPOSOME 1.3 % IJ SUSP: 20.0000 mL | Freq: Once | INTRAMUSCULAR | Status: DC

## 2023-11-11 MED ORDER — HYDRALAZINE HCL 20 MG/ML IJ SOLN
5.0000 mg | INTRAMUSCULAR | Status: DC
  Administered 2023-11-11: 5 mg via INTRAVENOUS

## 2023-11-11 MED ORDER — ACETAMINOPHEN 325 MG PO TABS
325.0000 mg | ORAL_TABLET | Freq: Four times a day (QID) | ORAL | Status: DC | PRN
  Administered 2023-11-12: 650 mg via ORAL
  Filled 2023-11-11: qty 2

## 2023-11-11 MED ORDER — PROPOFOL 10 MG/ML IV BOLUS
INTRAVENOUS | Status: AC
  Filled 2023-11-11: qty 20

## 2023-11-11 MED ORDER — SODIUM CHLORIDE (PF) 0.9 % IJ SOLN
INTRAMUSCULAR | Status: AC
  Filled 2023-11-11: qty 50

## 2023-11-11 MED ORDER — DEXAMETHASONE SODIUM PHOSPHATE 10 MG/ML IJ SOLN
8.0000 mg | Freq: Once | INTRAMUSCULAR | Status: AC
  Administered 2023-11-11: 8 mg via INTRAVENOUS

## 2023-11-11 MED ORDER — DIPHENHYDRAMINE HCL 12.5 MG/5ML PO ELIX: 12.5000 mg | ORAL_SOLUTION | ORAL | Status: DC | PRN

## 2023-11-11 MED ORDER — ESCITALOPRAM OXALATE 20 MG PO TABS
20.0000 mg | ORAL_TABLET | Freq: Every morning | ORAL | Status: DC
  Administered 2023-11-12: 20 mg via ORAL
  Filled 2023-11-11: qty 1

## 2023-11-11 MED ORDER — OXYCODONE HCL 5 MG PO TABS: 5.0000 mg | ORAL_TABLET | Freq: Once | ORAL | Status: AC | PRN

## 2023-11-11 MED ORDER — CEFAZOLIN SODIUM-DEXTROSE 2-4 GM/100ML-% IV SOLN
2.0000 g | Freq: Four times a day (QID) | INTRAVENOUS | Status: AC
  Administered 2023-11-11 (×2): 2 g via INTRAVENOUS
  Filled 2023-11-11 (×2): qty 100

## 2023-11-11 MED ORDER — CHLORHEXIDINE GLUCONATE 0.12 % MT SOLN
15.0000 mL | Freq: Once | OROMUCOSAL | Status: AC
  Administered 2023-11-11: 15 mL via OROMUCOSAL

## 2023-11-11 MED ORDER — ACETAMINOPHEN 500 MG PO TABS
1000.0000 mg | ORAL_TABLET | Freq: Four times a day (QID) | ORAL | Status: AC
Start: 2023-11-11 — End: 2023-11-12
  Administered 2023-11-11 – 2023-11-12 (×3): 1000 mg via ORAL
  Filled 2023-11-11 (×4): qty 2

## 2023-11-11 MED ORDER — METHOCARBAMOL 500 MG PO TABS
ORAL_TABLET | ORAL | Status: AC
Start: 2023-11-11 — End: 2023-11-12
  Filled 2023-11-11: qty 1

## 2023-11-11 MED ORDER — MORPHINE SULFATE (PF) 2 MG/ML IV SOLN: 1.0000 mg | INTRAVENOUS | Status: DC | PRN

## 2023-11-11 MED ORDER — TRANEXAMIC ACID-NACL 1000-0.7 MG/100ML-% IV SOLN
1000.0000 mg | INTRAVENOUS | Status: AC
Start: 2023-11-11 — End: 2023-11-11
  Administered 2023-11-11: 1000 mg via INTRAVENOUS
  Filled 2023-11-11: qty 100

## 2023-11-11 MED ORDER — OXYCODONE HCL 5 MG PO TABS
ORAL_TABLET | ORAL | Status: AC
  Administered 2023-11-11: 5 mg via ORAL
  Filled 2023-11-11: qty 1

## 2023-11-11 MED ORDER — DEXAMETHASONE SODIUM PHOSPHATE 10 MG/ML IJ SOLN
10.0000 mg | Freq: Once | INTRAMUSCULAR | Status: AC
  Administered 2023-11-12: 10 mg via INTRAVENOUS
  Filled 2023-11-11: qty 1

## 2023-11-11 MED ORDER — EPHEDRINE SULFATE-NACL 50-0.9 MG/10ML-% IV SOSY
PREFILLED_SYRINGE | INTRAVENOUS | Status: DC | PRN
  Administered 2023-11-11: 5 mg via INTRAVENOUS
  Administered 2023-11-11: 10 mg via INTRAVENOUS

## 2023-11-11 MED ORDER — NITROGLYCERIN 0.4 MG SL SUBL: 0.4000 mg | SUBLINGUAL_TABLET | SUBLINGUAL | Status: DC | PRN

## 2023-11-11 MED ORDER — CEFAZOLIN SODIUM-DEXTROSE 2-4 GM/100ML-% IV SOLN
2.0000 g | INTRAVENOUS | Status: AC
  Administered 2023-11-11: 2 g via INTRAVENOUS
  Filled 2023-11-11: qty 100

## 2023-11-11 MED ORDER — MENTHOL 3 MG MT LOZG: 1.0000 | LOZENGE | OROMUCOSAL | Status: DC | PRN

## 2023-11-11 MED ORDER — SODIUM CHLORIDE 0.9 % IV SOLN: INTRAVENOUS | Status: DC

## 2023-11-11 MED ORDER — SODIUM CHLORIDE (PF) 0.9 % IJ SOLN
INTRAMUSCULAR | Status: AC
  Filled 2023-11-11: qty 10

## 2023-11-11 MED ORDER — ONDANSETRON HCL 4 MG PO TABS: 4.0000 mg | ORAL_TABLET | Freq: Four times a day (QID) | ORAL | Status: DC | PRN

## 2023-11-11 MED ORDER — MIDAZOLAM HCL 2 MG/2ML IJ SOLN
1.0000 mg | Freq: Once | INTRAMUSCULAR | Status: AC
Start: 2023-11-11 — End: 2023-11-11
  Administered 2023-11-11: 2 mg via INTRAVENOUS
  Filled 2023-11-11: qty 2

## 2023-11-11 MED ORDER — PROPOFOL 1000 MG/100ML IV EMUL
INTRAVENOUS | Status: AC
  Filled 2023-11-11: qty 100

## 2023-11-11 MED ORDER — METHOCARBAMOL 500 MG PO TABS
500.0000 mg | ORAL_TABLET | Freq: Four times a day (QID) | ORAL | Status: DC | PRN
  Administered 2023-11-11 – 2023-11-12 (×3): 500 mg via ORAL
  Filled 2023-11-11 (×2): qty 1

## 2023-11-11 MED ORDER — FENTANYL CITRATE (PF) 100 MCG/2ML IJ SOLN
INTRAMUSCULAR | Status: DC | PRN
  Administered 2023-11-11 (×2): 50 ug via INTRAVENOUS
  Administered 2023-11-11: 100 ug via INTRAVENOUS
  Administered 2023-11-11 (×2): 50 ug via INTRAVENOUS

## 2023-11-11 SURGICAL SUPPLY — 44 items
ATTUNE MED DOME PAT 41 KNEE (Knees) IMPLANT
ATTUNE PS FEM RT SZ 8 CEM KNEE (Femur) IMPLANT
ATTUNE PSRP INSR SZ8 7 KNEE (Insert) IMPLANT
BAG COUNTER SPONGE SURGICOUNT (BAG) IMPLANT
BAG ZIPLOCK 12X15 (MISCELLANEOUS) ×1 IMPLANT
BASE TIBIAL ROT PLAT SZ 8 KNEE (Knees) IMPLANT
BLADE SAG 18X100X1.27 (BLADE) ×1 IMPLANT
BLADE SAW SGTL 11.0X1.19X90.0M (BLADE) ×1 IMPLANT
BNDG ELASTIC 6INX 5YD STR LF (GAUZE/BANDAGES/DRESSINGS) ×1 IMPLANT
BOWL SMART MIX CTS (DISPOSABLE) ×1 IMPLANT
CEMENT HV SMART SET (Cement) ×2 IMPLANT
COVER SURGICAL LIGHT HANDLE (MISCELLANEOUS) ×1 IMPLANT
CUFF TRNQT CYL 34X4.125X (TOURNIQUET CUFF) ×1 IMPLANT
DERMABOND ADVANCED .7 DNX12 (GAUZE/BANDAGES/DRESSINGS) ×1 IMPLANT
DRAPE U-SHAPE 47X51 STRL (DRAPES) ×1 IMPLANT
DRSG AQUACEL AG ADV 3.5X10 (GAUZE/BANDAGES/DRESSINGS) ×1 IMPLANT
DURAPREP 26ML APPLICATOR (WOUND CARE) ×1 IMPLANT
ELECT PENCIL ROCKER SW 15FT (MISCELLANEOUS) ×1 IMPLANT
ELECT REM PT RETURN 15FT ADLT (MISCELLANEOUS) ×1 IMPLANT
GLOVE BIO SURGEON STRL SZ 6.5 (GLOVE) IMPLANT
GLOVE BIO SURGEON STRL SZ8 (GLOVE) ×1 IMPLANT
GLOVE BIOGEL PI IND STRL 7.0 (GLOVE) IMPLANT
GLOVE BIOGEL PI IND STRL 8 (GLOVE) ×1 IMPLANT
GOWN STRL REUS W/ TWL LRG LVL3 (GOWN DISPOSABLE) ×1 IMPLANT
IMMOBILIZER KNEE 20 (SOFTGOODS) ×1 IMPLANT
IMMOBILIZER KNEE 20 THIGH 36 (SOFTGOODS) ×1 IMPLANT
KIT TURNOVER KIT A (KITS) IMPLANT
MANIFOLD NEPTUNE II (INSTRUMENTS) ×1 IMPLANT
NS IRRIG 1000ML POUR BTL (IV SOLUTION) ×1 IMPLANT
PACK TOTAL KNEE CUSTOM (KITS) ×1 IMPLANT
PADDING CAST COTTON 6X4 STRL (CAST SUPPLIES) ×2 IMPLANT
PIN STEINMAN FIXATION KNEE (PIN) IMPLANT
PROTECTOR NERVE ULNAR (MISCELLANEOUS) ×1 IMPLANT
SET HNDPC FAN SPRY TIP SCT (DISPOSABLE) ×1 IMPLANT
SUT MNCRL AB 4-0 PS2 18 (SUTURE) ×1 IMPLANT
SUT STRATAFIX 0 PDS 27 VIOLET (SUTURE) ×1 IMPLANT
SUT VIC AB 0 CT1 36 (SUTURE) ×1 IMPLANT
SUT VIC AB 2-0 CT1 TAPERPNT 27 (SUTURE) ×3 IMPLANT
SUTURE STRATFX 0 PDS 27 VIOLET (SUTURE) ×1 IMPLANT
TIBIAL BASE ROT PLAT SZ 8 KNEE (Knees) ×1 IMPLANT
TOWEL GREEN STERILE FF (TOWEL DISPOSABLE) ×1 IMPLANT
TUBE SUCTION HIGH CAP CLEAR NV (SUCTIONS) ×1 IMPLANT
WATER STERILE IRR 1000ML POUR (IV SOLUTION) ×2 IMPLANT
WRAP KNEE MAXI GEL POST OP (GAUZE/BANDAGES/DRESSINGS) ×1 IMPLANT

## 2023-11-11 NOTE — Anesthesia Procedure Notes (Signed)
 Anesthesia Regional Block: Adductor canal block   Pre-Anesthetic Checklist: , timeout performed,  Correct Patient, Correct Site, Correct Laterality,  Correct Procedure,, site marked,  Risks and benefits discussed,  Surgical consent,  Pre-op evaluation,  At surgeon's request and post-op pain management  Laterality: Right  Prep: chloraprep       Needles:  Injection technique: Single-shot  Needle Type: Echogenic Stimulator Needle     Needle Length: 10cm  Needle Gauge: 20     Additional Needles:   Procedures:,,,, ultrasound used (permanent image in chart),,    Narrative:  Start time: 11/11/2023 9:50 AM End time: 11/11/2023 10:00 AM Injection made incrementally with aspirations every 5 mL.  Performed by: Personally  Anesthesiologist: Peggi Bowels, MD  Additional Notes: Functioning IV was confirmed and monitors were applied. A time-out was performed. Hand hygiene and sterile gloves were used. The thigh was placed in a frog-leg position and prepped in a sterile fashion. A 20ga Bbraun echogenic stimulator needle was placed using ultrasound guidance.  Negative aspiration and negative test dose prior to incremental administration of local anesthetic. The patient tolerated the procedure well.

## 2023-11-11 NOTE — Interval H&P Note (Signed)
 History and Physical Interval Note:  11/11/2023 8:20 AM  Patrick Rubio  has presented today for surgery, with the diagnosis of Rt Knee Osteoarthritis.  The various methods of treatment have been discussed with the patient and family. After consideration of risks, benefits and other options for treatment, the patient has consented to  Procedure(s): ARTHROPLASTY, KNEE, TOTAL (Right) as a surgical intervention.  The patient's history has been reviewed, patient examined, no change in status, stable for surgery.  I have reviewed the patient's chart and labs.  Questions were answered to the patient's satisfaction.     Samuel Crock Woodie Trusty

## 2023-11-11 NOTE — Plan of Care (Signed)
  Problem: Education: Goal: Knowledge of General Education information will improve Description: Including pain rating scale, medication(s)/side effects and non-pharmacologic comfort measures Outcome: Progressing   Problem: Health Behavior/Discharge Planning: Goal: Ability to manage health-related needs will improve Outcome: Progressing   Problem: Clinical Measurements: Goal: Ability to maintain clinical measurements within normal limits will improve Outcome: Progressing Goal: Will remain free from infection Outcome: Progressing Goal: Diagnostic test results will improve Outcome: Progressing Goal: Respiratory complications will improve Outcome: Progressing Goal: Cardiovascular complication will be avoided Outcome: Progressing   Problem: Activity: Goal: Risk for activity intolerance will decrease Outcome: Progressing   Problem: Nutrition: Goal: Adequate nutrition will be maintained Outcome: Completed/Met   Problem: Coping: Goal: Level of anxiety will decrease Outcome: Progressing   Problem: Elimination: Goal: Will not experience complications related to bowel motility Outcome: Progressing Goal: Will not experience complications related to urinary retention Outcome: Completed/Met   Problem: Pain Managment: Goal: General experience of comfort will improve and/or be controlled Outcome: Progressing   Problem: Safety: Goal: Ability to remain free from injury will improve Outcome: Progressing   Problem: Skin Integrity: Goal: Risk for impaired skin integrity will decrease Outcome: Progressing   Problem: Education: Goal: Knowledge of the prescribed therapeutic regimen will improve Outcome: Progressing Goal: Individualized Educational Video(s) Outcome: Completed/Met   Problem: Activity: Goal: Ability to avoid complications of mobility impairment will improve Outcome: Progressing Goal: Range of joint motion will improve Outcome: Adequate for Discharge   Problem:  Clinical Measurements: Goal: Postoperative complications will be avoided or minimized Outcome: Progressing   Problem: Pain Management: Goal: Pain level will decrease with appropriate interventions Outcome: Progressing   Problem: Skin Integrity: Goal: Will show signs of wound healing Outcome: Progressing

## 2023-11-11 NOTE — Transfer of Care (Signed)
 Immediate Anesthesia Transfer of Care Note  Patient: Patrick Rubio  Procedure(s) Performed: ARTHROPLASTY, KNEE, TOTAL (Right: Knee)  Patient Location: PACU  Anesthesia Type:General  Level of Consciousness: awake and alert   Airway & Oxygen Therapy: Patient Spontanous Breathing  Post-op Assessment: Report given to RN and Post -op Vital signs reviewed and stable  Post vital signs: Reviewed and stable  Last Vitals:  Vitals Value Taken Time  BP 182/107 11/11/23 1145  Temp    Pulse 66 11/11/23 1145  Resp 25 11/11/23 1145  SpO2 95 % 11/11/23 1145  Vitals shown include unfiled device data.  Last Pain:  Vitals:   11/11/23 1005  TempSrc:   PainSc: 0-No pain         Complications: No notable events documented.

## 2023-11-11 NOTE — Progress Notes (Signed)
 Orthopedic Tech Progress Note Patient Details:  Patrick Rubio 1960/12/28 [id number]  CPM Right Knee CPM Right Knee: On Right Knee Flexion (Degrees): 40 Right Knee Extension (Degrees): 10  Post Interventions Patient Tolerated: Fair  Patrick Rubio 11/11/2023, 12:02 PM

## 2023-11-11 NOTE — Op Note (Signed)
 OPERATIVE REPORT-TOTAL KNEE ARTHROPLASTY   Pre-operative diagnosis- Osteoarthritis  Right knee(s)  Post-operative diagnosis- Osteoarthritis Right knee(s)  Procedure-  Right  Total Knee Arthroplasty  Surgeon- Henri Loft. Silvia Hightower, MD  Assistant- Sharlynn Dear, PA-C   Anesthesia-   Adductor canal block and spinal  EBL-25 mL   Drains None  Tourniquet time-  Total Tourniquet Time Documented: Thigh (Right) - 34 minutes Total: Thigh (Right) - 34 minutes     Complications- None  Condition-PACU - hemodynamically stable.   Brief Clinical Note  Patrick Rubio is a 63 y.o. year old male with end stage OA of his right knee with progressively worsening pain and dysfunction. He has constant pain, with activity and at rest and significant functional deficits with difficulties even with ADLs. He has had extensive non-op management including analgesics, injections of cortisone and viscosupplements, and home exercise program, but remains in significant pain with significant dysfunction. Radiographs show bone on bone arthritis medial and patellofemoral. He presents now for right Total Knee Arthroplasty.     Procedure in detail---   The patient is brought into the operating room and positioned supine on the operating table. After successful administration of  Adductor canal block and spinal,   a tourniquet is placed high on the  Right thigh(s) and the lower extremity is prepped and draped in the usual sterile fashion. Time out is performed by the operating team and then the  Right lower extremity is wrapped in Esmarch, knee flexed and the tourniquet inflated to 300 mmHg.       A midline incision is made with a ten blade through the subcutaneous tissue to the level of the extensor mechanism. A fresh blade is used to make a medial parapatellar arthrotomy. Soft tissue over the proximal medial tibia is subperiosteally elevated to the joint line with a knife and into the semimembranosus bursa with a Cobb  elevator. Soft tissue over the proximal lateral tibia is elevated with attention being paid to avoiding the patellar tendon on the tibial tubercle. The patella is everted, knee flexed 90 degrees and the ACL and PCL are removed. Findings are bone on bone medial and patellofemoral with large global osteophytes        The drill is used to create a starting hole in the distal femur and the canal is thoroughly irrigated with sterile saline to remove the fatty contents. The 5 degree Right  valgus alignment guide is placed into the femoral canal and the distal femoral cutting block is pinned to remove 10 mm off the distal femur. Resection is made with an oscillating saw.      The tibia is subluxed forward and the menisci are removed. The extramedullary alignment guide is placed referencing proximally at the medial aspect of the tibial tubercle and distally along the second metatarsal axis and tibial crest. The block is pinned to remove 2mm off the more deficient medial  side. Resection is made with an oscillating saw. Size 8is the most appropriate size for the tibia and the proximal tibia is prepared with the modular drill and keel punch for that size.      The femoral sizing guide is placed and size 8 is most appropriate. Rotation is marked off the epicondylar axis and confirmed by creating a rectangular flexion gap at 90 degrees. The size 8 cutting block is pinned in this rotation and the anterior, posterior and chamfer cuts are made with the oscillating saw. The intercondylar block is then placed and that cut is made.  Trial size 8 tibial component, trial size 8 posterior stabilized femur and a 8  mm posterior stabilized rotating platform insert trial is placed. Full extension is achieved with excellent varus/valgus and anterior/posterior balance throughout full range of motion. The patella is everted and thickness measured to be 27  mm. Free hand resection is taken to 15 mm, a 41 template is placed, lug holes  are drilled, trial patella is placed, and it tracks normally. Osteophytes are removed off the posterior femur with the trial in place. All trials are removed and the cut bone surfaces prepared with pulsatile lavage. Cement is mixed and once ready for implantation, the size 8 tibial implant, size  8 posterior stabilized femoral component, and the size 41 patella are cemented in place and the patella is held with the clamp. The trial insert is placed and the knee held in full extension. The Exparel (20 ml mixed with 60 ml saline) is injected into the extensor mechanism, posterior capsule, medial and lateral gutters and subcutaneous tissues.  All extruded cement is removed and once the cement is hard the permanent 8 mm posterior stabilized rotating platform insert is placed into the tibial tray.      The wound is copiously irrigated with saline solution and the extensor mechanism closed with # 0 Stratofix suture. The tourniquet is released for a total tourniquet time of 34  minutes. Flexion against gravity is 140 degrees and the patella tracks normally. Subcutaneous tissue is closed with 2.0 vicryl and subcuticular with running 4.0 Monocryl. The incision is cleaned and dried and steri-strips and a bulky sterile dressing are applied. The limb is placed into a knee immobilizer and the patient is awakened and transported to recovery in stable condition.      Please note that a surgical assistant was a medical necessity for this procedure in order to perform it in a safe and expeditious manner. Surgical assistant was necessary to retract the ligaments and vital neurovascular structures to prevent injury to them and also necessary for proper positioning of the limb to allow for anatomic placement of the prosthesis.   Henri Loft Aceyn Kathol, MD    11/11/2023, 11:21 AM

## 2023-11-11 NOTE — Evaluation (Signed)
 Physical Therapy Evaluation Patient Details Name: Patrick Rubio MRN: [id number] DOB: 05/05/1961 Today's Date: 11/11/2023  History of Present Illness  Pt is 63 yo male s/p R TKA on 11/11/23.  Pt with hx including but not limited to CHF, GERD, CAD, anemia, CAD, MI, lymphoma, stomach CA, back sx per pt  Clinical Impression  Pt is s/p TKA resulting in the deficits listed below (see PT Problem List). At baseline, pt is independent.  He has DME and home support.  Today, pt motivated to work with therapy, good quad activation and pain control.  He ambulated 36' with RW and CGA .  Tolerated well. Expected to progress well with therapy. Pt will benefit from acute skilled PT to increase their independence and safety with mobility to allow discharge.          If plan is discharge home, recommend the following: A little help with walking and/or transfers;A little help with bathing/dressing/bathroom;Assistance with cooking/housework;Help with stairs or ramp for entrance   Can travel by private vehicle        Equipment Recommendations None recommended by PT  Recommendations for Other Services       Functional Status Assessment Patient has had a recent decline in their functional status and demonstrates the ability to make significant improvements in function in a reasonable and predictable amount of time.     Precautions / Restrictions Precautions Precautions: Fall;Knee Restrictions Weight Bearing Restrictions Per Provider Order: Yes RLE Weight Bearing Per Provider Order: Weight bearing as tolerated      Mobility  Bed Mobility Overal bed mobility: Needs Assistance Bed Mobility: Supine to Sit     Supine to sit: Supervision          Transfers Overall transfer level: Needs assistance Equipment used: Rolling walker (2 wheels) Transfers: Sit to/from Stand Sit to Stand: Contact guard assist           General transfer comment: cues for R LE managment and hand placement     Ambulation/Gait Ambulation/Gait assistance: Contact guard assist Gait Distance (Feet): 60 Feet Assistive device: Rolling walker (2 wheels) Gait Pattern/deviations: Step-to pattern, Decreased stride length, Decreased weight shift to right Gait velocity: decreased but functional     General Gait Details: cues for RW proximity  Stairs            Wheelchair Mobility     Tilt Bed    Modified Rankin (Stroke Patients Only)       Balance Overall balance assessment: Needs assistance Sitting-balance support: No upper extremity supported Sitting balance-Leahy Scale: Good     Standing balance support: No upper extremity supported, Bilateral upper extremity supported Standing balance-Leahy Scale: Fair Standing balance comment: RW to ambulate but could stand without support                             Pertinent Vitals/Pain Pain Assessment Pain Assessment: 0-10 Pain Score: 5  Pain Location: R knee Pain Descriptors / Indicators: Discomfort Pain Intervention(s): Limited activity within patient's tolerance, Monitored during session, Ice applied, Repositioned    Home Living Family/patient expects to be discharged to:: Private residence Living Arrangements: Spouse/significant other Available Help at Discharge: Family;Available 24 hours/day Type of Home: House Home Access: Stairs to enter Entrance Stairs-Rails: Doctor, general practice of Steps: 2   Home Layout: One level Home Equipment: Agricultural consultant (2 wheels);Cane - single point      Prior Function Prior Level of Function : Independent/Modified Independent;Driving  Extremity/Trunk Assessment   Upper Extremity Assessment Upper Extremity Assessment: Overall WFL for tasks assessed    Lower Extremity Assessment Lower Extremity Assessment: LLE deficits/detail;RLE deficits/detail RLE Deficits / Details: Expected post op changes; ROM: knee 10 to 50 degrees; MMT: ankle  5/5, knee and hip 3/5 not further tested LLE Deficits / Details: ROM WFL; MMT 5/5    Cervical / Trunk Assessment Cervical / Trunk Assessment: Normal  Communication        Cognition Arousal: Alert Behavior During Therapy: WFL for tasks assessed/performed   PT - Cognitive impairments: No apparent impairments                                 Cueing       General Comments General comments (skin integrity, edema, etc.): VSS    Exercises     Assessment/Plan    PT Assessment Patient needs continued PT services  PT Problem List Decreased strength;Pain;Decreased range of motion;Decreased activity tolerance;Decreased balance;Decreased mobility;Decreased knowledge of use of DME       PT Treatment Interventions DME instruction;Therapeutic exercise;Gait training;Balance training;Stair training;Functional mobility training;Therapeutic activities;Patient/family education;Modalities    PT Goals (Current goals can be found in the Care Plan section)  Acute Rehab PT Goals Patient Stated Goal: return home PT Goal Formulation: With patient/family Time For Goal Achievement: 11/25/23 Potential to Achieve Goals: Good    Frequency 7X/week     Co-evaluation               AM-PAC PT "6 Clicks" Mobility  Outcome Measure Help needed turning from your back to your side while in a flat bed without using bedrails?: A Little Help needed moving from lying on your back to sitting on the side of a flat bed without using bedrails?: A Little Help needed moving to and from a bed to a chair (including a wheelchair)?: A Little Help needed standing up from a chair using your arms (e.g., wheelchair or bedside chair)?: A Little Help needed to walk in hospital room?: A Little Help needed climbing 3-5 steps with a railing? : A Little 6 Click Score: 18    End of Session Equipment Utilized During Treatment: Gait belt Activity Tolerance: Patient tolerated treatment well Patient left: with  chair alarm set;in chair;with call bell/phone within reach;with SCD's reapplied;with family/visitor present Nurse Communication: Mobility status PT Visit Diagnosis: Other abnormalities of gait and mobility (R26.89);Muscle weakness (generalized) (M62.81)    Time: 1610-9604 PT Time Calculation (min) (ACUTE ONLY): 20 min   Charges:   PT Evaluation $PT Eval Low Complexity: 1 Low   PT General Charges $$ ACUTE PT VISIT: 1 Visit         Cyd Dowse, PT Acute Rehab [facility] [phone]   Carolynn Citrin 11/11/2023, 4:10 PM

## 2023-11-11 NOTE — Anesthesia Postprocedure Evaluation (Signed)
 Anesthesia Post Note  Patient: Patrick Rubio  Procedure(s) Performed: ARTHROPLASTY, KNEE, TOTAL (Right: Knee)     Patient location during evaluation: PACU Anesthesia Type: Regional and General Level of consciousness: awake Pain management: pain level controlled Vital Signs Assessment: post-procedure vital signs reviewed and stable Respiratory status: spontaneous breathing, nonlabored ventilation and respiratory function stable Cardiovascular status: blood pressure returned to baseline and stable Postop Assessment: no apparent nausea or vomiting Anesthetic complications: no   No notable events documented.  Last Vitals:  Vitals:   11/11/23 1315 11/11/23 1333  BP: (!) 141/84 (!) 143/97  Pulse: (!) 53 (!) 51  Resp: 18 16  Temp: (!) 36.3 C 36.5 C  SpO2: 95% 99%    Last Pain:  Vitals:   11/11/23 1658  TempSrc:   PainSc: 6                  Fabian Coca P Zakya Halabi

## 2023-11-11 NOTE — Discharge Instructions (Signed)
 Ollen Gross, MD Total Joint Specialist [facility] [place] [address], [address] [place], [place] [address] [phone]  TOTAL KNEE REPLACEMENT POSTOPERATIVE DIRECTIONS    Knee Rehabilitation, Guidelines Following Surgery  Results after knee surgery are often greatly improved when you follow the exercise, range of motion and muscle strengthening exercises prescribed by your doctor. Safety measures are also important to protect the knee from further injury. If any of these exercises cause you to have increased pain or swelling in your knee joint, decrease the amount until you are comfortable again and slowly increase them. If you have problems or questions, call your caregiver or physical therapist for advice.   BLOOD CLOT PREVENTION Take 325 mg Aspirin two times a day for three weeks following surgery. Then take an 81 mg Aspirin once a day for three weeks. Then resume your normal Aspirin regimen of 81mg  once a day on Mondays, Wednesdays, and Fridays. Resume your normal dose of Prasugrel. You may resume your vitamins/supplements upon discharge from the hospital. Do not take any NSAIDs (Advil, Aleve, Ibuprofen, Meloxicam, etc.) while taking Prasugrel or 325mg  Aspirin.  HOME CARE INSTRUCTIONS  Remove items at home which could result in a fall. This includes throw rugs or furniture in walking pathways.  ICE to the affected knee as much as tolerated. Icing helps control swelling. If the swelling is well controlled you will be more comfortable and rehab easier. Continue to use ice on the knee for pain and swelling from surgery. You may notice swelling that will progress down to the foot and ankle. This is normal after surgery. Elevate the leg when you are not up walking on it.    Continue to use the breathing machine which will help keep your temperature down. It is common for your temperature to cycle up and down following surgery, especially at night when you are not up moving  around and exerting yourself. The breathing machine keeps your lungs expanded and your temperature down. Do not place pillow under the operative knee, focus on keeping the knee straight while resting  DIET You may resume your previous home diet once you are discharged from the hospital.  DRESSING / WOUND CARE / SHOWERING Keep your bulky bandage on for 2 days. On the third post-operative day you may remove the Ace bandage and gauze. There is a waterproof adhesive bandage on your skin which will stay in place until your first follow-up appointment. Once you remove this you will not need to place another bandage You may begin showering 3 days following surgery, but do not submerge the incision under water.  ACTIVITY For the first 5 days, the key is rest and control of pain and swelling Do your home exercises twice a day starting on post-operative day 3. On the days you go to physical therapy, just do the home exercises once that day. You should rest, ice and elevate the leg for 50 minutes out of every hour. Get up and walk/stretch for 10 minutes per hour. After 5 days you can increase your activity slowly as tolerated. Walk with your walker as instructed. Use the walker until you are comfortable transitioning to a cane. Walk with the cane in the opposite hand of the operative leg. You may discontinue the cane once you are comfortable and walking steadily. Avoid periods of inactivity such as sitting longer than an hour when not asleep. This helps prevent blood clots.  You may discontinue the knee immobilizer once you are able to perform  a straight leg raise while lying down. You may resume a sexual relationship in one month or when given the OK by your doctor.  You may return to work once you are cleared by your doctor.  Do not drive a car for 6 weeks or until released by your surgeon.  Do not drive while taking narcotics.  TED HOSE STOCKINGS Wear the elastic stockings on both legs for three weeks  following surgery during the day. You may remove them at night for sleeping.  WEIGHT BEARING Weight bearing as tolerated with assist device (walker, cane, etc) as directed, use it as long as suggested by your surgeon or therapist, typically at least 4-6 weeks.  POSTOPERATIVE CONSTIPATION PROTOCOL Constipation - defined medically as fewer than three stools per week and severe constipation as less than one stool per week.  One of the most common issues patients have following surgery is constipation.  Even if you have a regular bowel pattern at home, your normal regimen is likely to be disrupted due to multiple reasons following surgery.  Combination of anesthesia, postoperative narcotics, change in appetite and fluid intake all can affect your bowels.  In order to avoid complications following surgery, here are some recommendations in order to help you during your recovery period.  Colace (docusate) - Pick up an over-the-counter form of Colace or another stool softener and take twice a day as long as you are requiring postoperative pain medications.  Take with a full glass of water daily.  If you experience loose stools or diarrhea, hold the colace until you stool forms back up. If your symptoms do not get better within 1 week or if they get worse, check with your doctor. Dulcolax (bisacodyl) - Pick up over-the-counter and take as directed by the product packaging as needed to assist with the movement of your bowels.  Take with a full glass of water.  Use this product as needed if not relieved by Colace only.  MiraLax (polyethylene glycol) - Pick up over-the-counter to have on hand. MiraLax is a solution that will increase the amount of water in your bowels to assist with bowel movements.  Take as directed and can mix with a glass of water, juice, soda, coffee, or tea. Take if you go more than two days without a movement. Do not use MiraLax more than once per day. Call your doctor if you are still  constipated or irregular after using this medication for 7 days in a row.  If you continue to have problems with postoperative constipation, please contact the office for further assistance and recommendations.  If you experience "the worst abdominal pain ever" or develop nausea or vomiting, please contact the office immediatly for further recommendations for treatment.  ITCHING If you experience itching with your medications, try taking only a single pain pill, or even half a pain pill at a time.  You can also use Benadryl over the counter for itching or also to help with sleep.   MEDICATIONS See your medication summary on the "After Visit Summary" that the nursing staff will review with you prior to discharge.  You may have some home medications which will be placed on hold until you complete the course of blood thinner medication.  It is important for you to complete the blood thinner medication as prescribed by your surgeon.  Continue your approved medications as instructed at time of discharge.  PRECAUTIONS If you experience chest pain or shortness of breath - call 911 immediately for  transfer to the hospital emergency department.  If you develop a fever greater that 101 F, purulent drainage from wound, increased redness or drainage from wound, foul odor from the wound/dressing, or calf pain - CONTACT YOUR SURGEON.                                                   FOLLOW-UP APPOINTMENTS Make sure you keep all of your appointments after your operation with your surgeon and caregivers. You should call the office at the above phone number and make an appointment for approximately two weeks after the date of your surgery or on the date instructed by your surgeon outlined in the "After Visit Summary".  RANGE OF MOTION AND STRENGTHENING EXERCISES  Rehabilitation of the knee is important following a knee injury or an operation. After just a few days of immobilization, the muscles of the thigh which  control the knee become weakened and shrink (atrophy). Knee exercises are designed to build up the tone and strength of the thigh muscles and to improve knee motion. Often times heat used for twenty to thirty minutes before working out will loosen up your tissues and help with improving the range of motion but do not use heat for the first two weeks following surgery. These exercises can be done on a training (exercise) mat, on the floor, on a table or on a bed. Use what ever works the best and is most comfortable for you Knee exercises include:  Leg Lifts - While your knee is still immobilized in a splint or cast, you can do straight leg raises. Lift the leg to 60 degrees, hold for 3 sec, and slowly lower the leg. Repeat 10-20 times 2-3 times daily. Perform this exercise against resistance later as your knee gets better.  Quad and Hamstring Sets - Tighten up the muscle on the front of the thigh (Quad) and hold for 5-10 sec. Repeat this 10-20 times hourly. Hamstring sets are done by pushing the foot backward against an object and holding for 5-10 sec. Repeat as with quad sets.  Leg Slides: Lying on your back, slowly slide your foot toward your buttocks, bending your knee up off the floor (only go as far as is comfortable). Then slowly slide your foot back down until your leg is flat on the floor again. Angel Wings: Lying on your back spread your legs to the side as far apart as you can without causing discomfort.  A rehabilitation program following serious knee injuries can speed recovery and prevent re-injury in the future due to weakened muscles. Contact your doctor or a physical therapist for more information on knee rehabilitation.   POST-OPERATIVE OPIOID TAPER INSTRUCTIONS: It is important to wean off of your opioid medication as soon as possible. If you do not need pain medication after your surgery it is ok to stop day one. Opioids include: Codeine, Hydrocodone(Norco, Vicodin), Oxycodone(Percocet,  oxycontin) and hydromorphone amongst others.  Long term and even short term use of opiods can cause: Increased pain response Dependence Constipation Depression Respiratory depression And more.  Withdrawal symptoms can include Flu like symptoms Nausea, vomiting And more Techniques to manage these symptoms Hydrate well Eat regular healthy meals Stay active Use relaxation techniques(deep breathing, meditating, yoga) Do Not substitute Alcohol to help with tapering If you have been on opioids for less than two  weeks and do not have pain than it is ok to stop all together.  Plan to wean off of opioids This plan should start within one week post op of your joint replacement. Maintain the same interval or time between taking each dose and first decrease the dose.  Cut the total daily intake of opioids by one tablet each day Next start to increase the time between doses. The last dose that should be eliminated is the evening dose.   IF YOU ARE TRANSFERRED TO A SKILLED REHAB FACILITY If the patient is transferred to a skilled rehab facility following release from the hospital, a list of the current medications will be sent to the facility for the patient to continue.  When discharged from the skilled rehab facility, please have the facility set up the patient's Home Health Physical Therapy prior to being released. Also, the skilled facility will be responsible for providing the patient with their medications at time of release from the facility to include their pain medication, the muscle relaxants, and their blood thinner medication. If the patient is still at the rehab facility at time of the two week follow up appointment, the skilled rehab facility will also need to assist the patient in arranging follow up appointment in our office and any transportation needs.  MAKE SURE YOU:  Understand these instructions.  Get help right away if you are not doing well or get worse.   DENTAL  ANTIBIOTICS:  In most cases prophylactic antibiotics for Dental procdeures after total joint surgery are not necessary.  Exceptions are as follows:  1. History of prior total joint infection  2. Severely immunocompromised (Organ Transplant, cancer chemotherapy, Rheumatoid biologic meds such as Humera)  3. Poorly controlled diabetes (A1C &gt; 8.0, blood glucose over 200)  If you have one of these conditions, contact your surgeon for an antibiotic prescription, prior to your dental procedure.    Pick up stool softner and laxative for home use following surgery while on pain medications. Do not submerge incision under water. Please use good hand washing techniques while changing dressing each day. May shower starting three days after surgery. Please use a clean towel to pat the incision dry following showers. Continue to use ice for pain and swelling after surgery. Do not use any lotions or creams on the incision until instructed by your surgeon.

## 2023-11-11 NOTE — Anesthesia Procedure Notes (Signed)
 Procedure Name: LMA Insertion Date/Time: 11/11/2023 10:19 AM  Performed by: Fairton Cid, CRNAPre-anesthesia Checklist: Patient identified, Emergency Drugs available, Suction available, Patient being monitored and Timeout performed Patient Re-evaluated:Patient Re-evaluated prior to induction Oxygen Delivery Method: Circle system utilized Preoxygenation: Pre-oxygenation with 100% oxygen Ventilation: Mask ventilation without difficulty LMA: LMA inserted LMA Size: 4.0 Tube type: Oral Number of attempts: 1 Secured at: 22 cm Tube secured with: Tape Dental Injury: Teeth and Oropharynx as per pre-operative assessment

## 2023-11-11 NOTE — Progress Notes (Signed)
 Orthopedic Tech Progress Note Patient Details:  Patrick Rubio 1960/09/05 [id number]  CPM Right Knee CPM Right Knee: Off Right Knee Flexion (Degrees): 40 Right Knee Extension (Degrees): 10  Post Interventions Patient Tolerated: Fair  [name] 11/11/2023, 4:12 PM

## 2023-11-12 ENCOUNTER — Encounter (HOSPITAL_COMMUNITY): Payer: Self-pay | Admitting: Orthopedic Surgery

## 2023-11-12 DIAGNOSIS — M1711 Unilateral primary osteoarthritis, right knee: Secondary | ICD-10-CM | POA: Diagnosis not present

## 2023-11-12 LAB — CBC
HCT: 33.8 % — ABNORMAL LOW (ref 39.0–52.0)
Hemoglobin: 10.8 g/dL — ABNORMAL LOW (ref 13.0–17.0)
MCH: 30.4 pg (ref 26.0–34.0)
MCHC: 32 g/dL (ref 30.0–36.0)
MCV: 95.2 fL (ref 80.0–100.0)
Platelets: 162 10*3/uL (ref 150–400)
RBC: 3.55 MIL/uL — ABNORMAL LOW (ref 4.22–5.81)
RDW: 13.1 % (ref 11.5–15.5)
WBC: 13.9 10*3/uL — ABNORMAL HIGH (ref 4.0–10.5)
nRBC: 0 % (ref 0.0–0.2)

## 2023-11-12 LAB — BASIC METABOLIC PANEL WITH GFR
Anion gap: 7 (ref 5–15)
BUN: 18 mg/dL (ref 8–23)
CO2: 24 mmol/L (ref 22–32)
Calcium: 8.6 mg/dL — ABNORMAL LOW (ref 8.9–10.3)
Chloride: 105 mmol/L (ref 98–111)
Creatinine, Ser: 1.24 mg/dL (ref 0.61–1.24)
GFR, Estimated: >60 mL/min (ref >60)
Glucose, Bld: 137 mg/dL — ABNORMAL HIGH (ref 70–99)
Potassium: 4.6 mmol/L (ref 3.5–5.1)
Sodium: 136 mmol/L (ref 135–145)

## 2023-11-12 MED ORDER — GABAPENTIN 300 MG PO CAPS: 300.0000 mg | ORAL_CAPSULE | Freq: Three times a day (TID) | ORAL | 0 refills | Status: AC

## 2023-11-12 MED ORDER — METHOCARBAMOL 500 MG PO TABS: 500.0000 mg | ORAL_TABLET | Freq: Four times a day (QID) | ORAL | 0 refills | Status: DC | PRN

## 2023-11-12 MED ORDER — ASPIRIN 325 MG PO TBEC: 325.0000 mg | DELAYED_RELEASE_TABLET | Freq: Every day | ORAL | 0 refills | Status: AC

## 2023-11-12 MED ORDER — ONDANSETRON HCL 4 MG PO TABS: 4.0000 mg | ORAL_TABLET | Freq: Four times a day (QID) | ORAL | 0 refills | Status: DC | PRN

## 2023-11-12 MED ORDER — OXYCODONE HCL 5 MG PO TABS
5.0000 mg | ORAL_TABLET | Freq: Four times a day (QID) | ORAL | 0 refills | Status: DC | PRN
Start: 2023-11-12 — End: 2024-05-20

## 2023-11-12 NOTE — Plan of Care (Signed)
  Problem: Education: Goal: Knowledge of General Education information will improve Description: Including pain rating scale, medication(s)/side effects and non-pharmacologic comfort measures Outcome: Adequate for Discharge   Problem: Health Behavior/Discharge Planning: Goal: Ability to manage health-related needs will improve Outcome: Adequate for Discharge   Problem: Clinical Measurements: Goal: Ability to maintain clinical measurements within normal limits will improve Outcome: Adequate for Discharge Goal: Will remain free from infection Outcome: Adequate for Discharge Goal: Diagnostic test results will improve Outcome: Adequate for Discharge Goal: Respiratory complications will improve Outcome: Adequate for Discharge Goal: Cardiovascular complication will be avoided Outcome: Adequate for Discharge   Problem: Activity: Goal: Risk for activity intolerance will decrease 11/12/2023 1122 by [place] Gillis A, LPN Outcome: Adequate for Discharge 11/12/2023 0933 by Alexa Hymen, LPN Outcome: Progressing   Problem: Coping: Goal: Level of anxiety will decrease Outcome: Adequate for Discharge   Problem: Elimination: Goal: Will not experience complications related to bowel motility Outcome: Adequate for Discharge   Problem: Pain Managment: Goal: General experience of comfort will improve and/or be controlled 11/12/2023 1122 by Venice Gillis A, LPN Outcome: Adequate for Discharge 11/12/2023 0933 by Alexa Hymen, LPN Outcome: Progressing   Problem: Safety: Goal: Ability to remain free from injury will improve Outcome: Adequate for Discharge   Problem: Skin Integrity: Goal: Risk for impaired skin integrity will decrease Outcome: Adequate for Discharge   Problem: Education: Goal: Knowledge of the prescribed therapeutic regimen will improve 11/12/2023 1122 by Alexa Hymen, LPN Outcome: Adequate for Discharge 11/12/2023 0933 by Alexa Hymen, LPN Outcome:  Progressing   Problem: Activity: Goal: Ability to avoid complications of mobility impairment will improve Outcome: Adequate for Discharge Goal: Range of joint motion will improve Outcome: Adequate for Discharge   Problem: Clinical Measurements: Goal: Postoperative complications will be avoided or minimized Outcome: Adequate for Discharge   Problem: Pain Management: Goal: Pain level will decrease with appropriate interventions Outcome: Adequate for Discharge   Problem: Skin Integrity: Goal: Will show signs of wound healing Outcome: Adequate for Discharge

## 2023-11-12 NOTE — Plan of Care (Signed)
  Problem: Activity: Goal: Risk for activity intolerance will decrease Outcome: Progressing   Problem: Pain Managment: Goal: General experience of comfort will improve and/or be controlled Outcome: Progressing   Problem: Education: Goal: Knowledge of the prescribed therapeutic regimen will improve Outcome: Progressing

## 2023-11-12 NOTE — Progress Notes (Addendum)
   Subjective: 1 Day Post-Op Procedure(s) (LRB): ARTHROPLASTY, KNEE, TOTAL (Right) Patient seen in rounds by Dr. Lequita Halt. Patient is well, and has had no acute complaints or problems. Denies SOB or chest pain. Denies calf pain. Patient reports pain as moderate. Worked with physical therapy yesterday and ambulated 60'. We will continue physical therapy today.  Objective: Vital signs in last 24 hours: Temp:  [97.3 F (36.3 C)-98 F (36.7 C)] 98 F (36.7 C) (04/15 0524) Pulse Rate:  [49-66] 53 (04/15 0524) Resp:  [10-19] 18 (04/15 0524) BP: (103-182)/(67-110) 103/67 (04/15 0524) SpO2:  [91 %-99 %] 94 % (04/15 0524) Weight:  [93.9 kg] 93.9 kg (04/14 0833)  Intake/Output from previous day:  Intake/Output Summary (Last 24 hours) at 11/12/2023 0740 Last data filed at 11/12/2023 0200 Gross per 24 hour  Intake 2315.93 ml  Output 25 ml  Net 2290.93 ml     Intake/Output this shift: No intake/output data recorded.  Labs: Recent Labs    11/12/23 0324  HGB 10.8*   Recent Labs    11/12/23 0324  WBC 13.9*  RBC 3.55*  HCT 33.8*  PLT 162   Recent Labs    11/12/23 0324  NA 136  K 4.6  CL 105  CO2 24  BUN 18  CREATININE 1.24  GLUCOSE 137*  CALCIUM 8.6*   No results for input(s): "LABPT", "INR" in the last 72 hours.  Exam: General - Patient is Alert and Oriented Extremity - Neurologically intact Neurovascular intact Sensation intact distally Dorsiflexion/Plantar flexion intact Dressing - dressing C/D/I Motor Function - intact, moving foot and toes well on exam.  Past Medical History:  Diagnosis Date   Arthritis    CAD (coronary artery disease)    Complication of anesthesia    Back surgery in 2003 - woke up after and had to have a breathing tx- 6 hour surgery   History of chemotherapy    2022   History of kidney stones    HTN (hypertension)    Hypercholesterolemia    MI (myocardial infarction) (HCC) 2003   Small lymphocytic lymphoma (HCC)    Stomach cancer  (HCC)     Assessment/Plan: 1 Day Post-Op Procedure(s) (LRB): ARTHROPLASTY, KNEE, TOTAL (Right) Principal Problem:   OA (osteoarthritis) of knee Active Problems:   Primary osteoarthritis of right knee  Estimated body mass index is 29.7 kg/m as calculated from the following:   Height as of this encounter: 5\' 10"  (1.778 m).   Weight as of this encounter: 93.9 kg. Advance diet Up with therapy D/C IV fluids  Patient's anticipated LOS is less than 2 midnights, meeting these requirements: - Younger than 93 - Lives within 1 hour of care - Has a competent adult at home to recover with post-op recover - NO history of  - Chronic pain requiring opiods  - Diabetes  - Heart failure  - Stroke  - DVT/VTE  - Cardiac arrhythmia  - Respiratory Failure/COPD  - Renal failure  - Anemia  - Advanced Liver disease  DVT Prophylaxis -  Prasugrel + Aspirin Weight bearing as tolerated.  Continue physical therapy. Expected discharge home today pending progress and if meeting patient goals. Scheduled for OPPT at [facility]). Follow-up in clinic in 2 weeks.  The PDMP database was reviewed today prior to any opioid medications being prescribed to this patient.  R. Arcola Jansky, PA-C Orthopedic Surgery 11/12/2023, 7:40 AM

## 2023-11-12 NOTE — TOC Transition Note (Signed)
 Transition of Care [facility]) - Discharge Note   Patient Details  Name: Patrick Rubio MRN: [id number] Date of Birth: 1961/01/31  Transition of Care [facility]) CM/SW Contact:  Delilah Fend, LCSW Phone Number: 11/12/2023, 11:27 AM   Clinical Narrative:     Met with pt who confirms he has needed DME in the home.  OPPT already arranged with [facility] in [place]  No further TOC needs.  Final next level of care: OP Rehab Barriers to Discharge: No Barriers Identified   Patient Goals and CMS Choice Patient states their goals for this hospitalization and ongoing recovery are:: return home          Discharge Placement                       Discharge Plan and Services Additional resources added to the After Visit Summary for                  DME Arranged: N/A DME Agency: NA                  Social Drivers of Health (SDOH) Interventions SDOH Screenings   Food Insecurity: No Food Insecurity (11/11/2023)  Housing: Low Risk  (11/11/2023)  Transportation Needs: No Transportation Needs (11/11/2023)  Utilities: Not At Risk (11/11/2023)  Alcohol Screen: Low Risk  (06/22/2020)  Depression (PHQ2-9): Low Risk  (06/22/2020)  Financial Resource Strain: Low Risk  (06/22/2020)  Physical Activity: Inactive (06/22/2020)  Social Connections: Socially Isolated (11/11/2023)  Stress: No Stress Concern Present (06/22/2020)  Tobacco Use: High Risk (11/11/2023)     Readmission Risk Interventions     No data to display

## 2023-11-12 NOTE — Progress Notes (Signed)
 Physical Therapy Treatment Patient Details Name: Patrick Rubio MRN: [id number] DOB: 1961/07/14 Today's Date: 11/12/2023   History of Present Illness Pt is 63 yo male s/p R TKA on 11/11/23.  Pt with hx including but not limited to CHF, GERD, CAD, anemia, CAD, MI, lymphoma, stomach CA, back sx per pt    PT Comments  Pt is POD # 1 and is progressing well.  Pt with good pain control, ROM, quad activation, and understanding of HEP.  He was able to ambulate 100' and performed stairs similar to home set up. Pt demonstrates safe gait & transfers in order to return home from PT perspective once discharged by MD.  While in hospital, will continue to benefit from PT for skilled therapy to advance mobility and exercises.       If plan is discharge home, recommend the following: A little help with walking and/or transfers;A little help with bathing/dressing/bathroom;Assistance with cooking/housework;Help with stairs or ramp for entrance   Can travel by private vehicle        Equipment Recommendations  None recommended by PT    Recommendations for Other Services       Precautions / Restrictions Precautions Precautions: Fall;Knee Restrictions RLE Weight Bearing Per Provider Order: Weight bearing as tolerated     Mobility  Bed Mobility Overal bed mobility: Needs Assistance Bed Mobility: Supine to Sit     Supine to sit: Supervision          Transfers Overall transfer level: Needs assistance Equipment used: Rolling walker (2 wheels) Transfers: Sit to/from Stand Sit to Stand: Supervision                Ambulation/Gait Ambulation/Gait assistance: Supervision Gait Distance (Feet): 100 Feet Assistive device: Rolling walker (2 wheels) Gait Pattern/deviations: Decreased stride length, Decreased weight shift to right, Step-through pattern Gait velocity: decreased but functional     General Gait Details: cues for RW proximity   Stairs Stairs: Yes Stairs assistance: Contact  guard assist Stair Management: Two rails, Step to pattern, Forwards Number of Stairs: 3 General stair comments: reached forward simulating grabbing door frame   Wheelchair Mobility     Tilt Bed    Modified Rankin (Stroke Patients Only)       Balance Overall balance assessment: Needs assistance Sitting-balance support: No upper extremity supported Sitting balance-Leahy Scale: Good     Standing balance support: No upper extremity supported, Bilateral upper extremity supported Standing balance-Leahy Scale: Fair Standing balance comment: RW to ambulate but could stand without support                            Communication    Cognition Arousal: Alert Behavior During Therapy: WFL for tasks assessed/performed   PT - Cognitive impairments: No apparent impairments                                Cueing    Exercises Total Joint Exercises Ankle Circles/Pumps: AROM, Both, 10 reps, Supine Quad Sets: AROM, Both, 10 reps, Supine Heel Slides: AAROM, Right, 10 reps, Supine Hip ABduction/ADduction: AAROM, Right, 10 reps, Supine Long Arc Quad: AROM, Right, 10 reps, Seated Knee Flexion: AROM, Right, 10 reps, Seated Goniometric ROM: R knee 5 to 70 Other Exercises Other Exercises: Cues for corret form and AAROM techniques if needed    General Comments  Educated on safe ice use, no pivots, car transfers, resting with  leg straight, and TED hose during day. Also, encouraged walking every 1-2 hours during day. Educated on HEP with focus on mobility the first weeks. Discussed doing exercises within pain control and if pain increasing could decreased ROM, reps, and stop exercises as needed. Encouraged to perform quad sets and ankle pumps frequently for blood flow and to promote full knee extension.       Pertinent Vitals/Pain Pain Assessment Pain Assessment: 0-10 Pain Score: 4  Pain Location: R knee Pain Descriptors / Indicators: Discomfort Pain  Intervention(s): Limited activity within patient's tolerance, Monitored during session, Premedicated before session, Repositioned    Home Living                          Prior Function            PT Goals (current goals can now be found in the care plan section) Progress towards PT goals: Progressing toward goals    Frequency    7X/week      PT Plan      Co-evaluation              AM-PAC PT "6 Clicks" Mobility   Outcome Measure  Help needed turning from your back to your side while in a flat bed without using bedrails?: A Little Help needed moving from lying on your back to sitting on the side of a flat bed without using bedrails?: A Little Help needed moving to and from a bed to a chair (including a wheelchair)?: A Little Help needed standing up from a chair using your arms (e.g., wheelchair or bedside chair)?: A Little Help needed to walk in hospital room?: A Little Help needed climbing 3-5 steps with a railing? : A Little 6 Click Score: 18    End of Session Equipment Utilized During Treatment: Gait belt Activity Tolerance: Patient tolerated treatment well Patient left: with chair alarm set;in chair;with call bell/phone within reach Nurse Communication: Mobility status PT Visit Diagnosis: Other abnormalities of gait and mobility (R26.89);Muscle weakness (generalized) (M62.81)     Time: 4098-1191 PT Time Calculation (min) (ACUTE ONLY): 24 min  Charges:    $Gait Training: 8-22 mins $Therapeutic Exercise: 8-22 mins PT General Charges $$ ACUTE PT VISIT: 1 Visit                     Cyd Dowse, PT Acute Rehab Services [facility] Rehab [phone]    Patrick Rubio 11/12/2023, 10:34 AM

## 2023-11-13 NOTE — Discharge Summary (Signed)
 Physician Discharge Summary   Patient ID: Patrick Rubio MRN: [id number] DOB/AGE: 1961/04/01 63 y.o.  Admit date: 11/11/2023 Discharge date: 11/12/2023  Primary Diagnosis: Osteoarthritis right knee   Admission Diagnoses:  Past Medical History:  Diagnosis Date   Arthritis    CAD (coronary artery disease)    Complication of anesthesia    Back surgery in 2003 - woke up after and had to have a breathing tx- 6 hour surgery   History of chemotherapy    2022   History of kidney stones    HTN (hypertension)    Hypercholesterolemia    MI (myocardial infarction) (HCC) 2003   Small lymphocytic lymphoma (HCC)    Stomach cancer (HCC)    Discharge Diagnoses:   Principal Problem:   OA (osteoarthritis) of knee Active Problems:   Primary osteoarthritis of right knee  Estimated body mass index is 29.7 kg/m as calculated from the following:   Height as of this encounter: 5\' 10"  (1.778 m).   Weight as of this encounter: 93.9 kg.  Procedure:  Procedure(s) (LRB): ARTHROPLASTY, KNEE, TOTAL (Right)   Consults: None  HPI: Patrick Rubio is a 63 y.o. year old male with end stage OA of his right knee with progressively worsening pain and dysfunction. He has constant pain, with activity and at rest and significant functional deficits with difficulties even with ADLs. He has had extensive non-op management including analgesics, injections of cortisone and viscosupplements, and home exercise program, but remains in significant pain with significant dysfunction. Radiographs show bone on bone arthritis medial and patellofemoral. He presents now for right Total Knee Arthroplasty.  Laboratory Data: Admission on 11/11/2023, Discharged on 11/12/2023  Component Date Value Ref Range Status   WBC 11/12/2023 13.9 (H)  4.0 - 10.5 K/uL Final   RBC 11/12/2023 3.55 (L)  4.22 - 5.81 MIL/uL Final   Hemoglobin 11/12/2023 10.8 (L)  13.0 - 17.0 g/dL Final   HCT 56/21/3086 33.8 (L)  39.0 - 52.0 % Final   MCV 11/12/2023  95.2  80.0 - 100.0 fL Final   MCH 11/12/2023 30.4  26.0 - 34.0 pg Final   MCHC 11/12/2023 32.0  30.0 - 36.0 g/dL Final   RDW 57/84/6962 13.1  11.5 - 15.5 % Final   Platelets 11/12/2023 162  150 - 400 K/uL Final   nRBC 11/12/2023 0.0  0.0 - 0.2 % Final   Performed at [facility], [address], [place], [place] [address]   Sodium 11/12/2023 136  135 - 145 mmol/L Final   Potassium 11/12/2023 4.6  3.5 - 5.1 mmol/L Final   Chloride 11/12/2023 105  98 - 111 mmol/L Final   CO2 11/12/2023 24  22 - 32 mmol/L Final   Glucose, Bld 11/12/2023 137 (H)  70 - 99 mg/dL Final   Glucose reference range applies only to samples taken after fasting for at least 8 hours.   BUN 11/12/2023 18  8 - 23 mg/dL Final   Creatinine, Ser 11/12/2023 1.24  0.61 - 1.24 mg/dL Final   Calcium 13/24/4010 8.6 (L)  8.9 - 10.3 mg/dL Final   GFR, Estimated 11/12/2023 >60  >60 mL/min Final   Comment: (NOTE) Calculated using the CKD-EPI Creatinine Equation (2021)    Anion gap 11/12/2023 7  5 - 15 Final   Performed at [facility], [address], [place], [place] [address]  Hospital Outpatient Visit on 11/04/2023  Component Date Value Ref Range Status   MRSA, PCR 11/04/2023 NEGATIVE  NEGATIVE Final  Staphylococcus aureus 11/04/2023 NEGATIVE  NEGATIVE Final   Comment: (NOTE) The Xpert SA Assay (FDA approved for NASAL specimens in patients 67 years of age and older), is one component of a comprehensive surveillance program. It is not intended to diagnose infection nor to guide or monitor treatment. Performed at [facility], [address], [place], [place] [address]    WBC 11/04/2023 8.4  4.0 - 10.5 K/uL Final   RBC 11/04/2023 4.27  4.22 - 5.81 MIL/uL Final   Hemoglobin 11/04/2023 13.3  13.0 - 17.0 g/dL Final   HCT 56/21/3086 40.3  39.0 - 52.0 % Final   MCV 11/04/2023 94.4  80.0 - 100.0 fL Final   MCH 11/04/2023 31.1  26.0 - 34.0 pg Final   MCHC 11/04/2023 33.0   30.0 - 36.0 g/dL Final   RDW 57/84/6962 13.0  11.5 - 15.5 % Final   Platelets 11/04/2023 163  150 - 400 K/uL Final   nRBC 11/04/2023 0.0  0.0 - 0.2 % Final   Performed at [facility], [address], [place], [place] [address]   Sodium 11/04/2023 138  135 - 145 mmol/L Final   Potassium 11/04/2023 3.8  3.5 - 5.1 mmol/L Final   Chloride 11/04/2023 106  98 - 111 mmol/L Final   CO2 11/04/2023 24  22 - 32 mmol/L Final   Glucose, Bld 11/04/2023 84  70 - 99 mg/dL Final   Glucose reference range applies only to samples taken after fasting for at least 8 hours.   BUN 11/04/2023 16  8 - 23 mg/dL Final   Creatinine, Ser 11/04/2023 0.59 (L)  0.61 - 1.24 mg/dL Final   Calcium 13/24/4010 8.8 (L)  8.9 - 10.3 mg/dL Final   GFR, Estimated 11/04/2023 >60  >60 mL/min Final   Comment: (NOTE) Calculated using the CKD-EPI Creatinine Equation (2021)    Anion gap 11/04/2023 8  5 - 15 Final   Performed at [facility], [address], [place], [place] [address]     X-Rays:No results found.  EKG: Orders placed or performed during the hospital encounter of 11/04/23   EKG 12 lead per protocol   EKG 12 lead per protocol     Hospital Course: Patrick Rubio is a 63 y.o. who was admitted to [facility]. They were brought to the operating room on 11/11/2023 and underwent Procedure(s): ARTHROPLASTY, KNEE, TOTAL.  Patient tolerated the procedure well and was later transferred to the recovery room and then to the orthopaedic floor for postoperative care. They were given PO and IV analgesics for pain control following their surgery. They were given 24 hours of postoperative antibiotics of  Anti-infectives (From admission, onward)    Start     Dose/Rate Route Frequency Ordered Stop   11/11/23 1600  ceFAZolin (ANCEF) IVPB 2g/100 mL premix        2 g 200 mL/hr over 30 Minutes Intravenous Every 6 hours 11/11/23 1339 11/11/23 2200   11/11/23 0830  ceFAZolin (ANCEF) IVPB  2g/100 mL premix        2 g 200 mL/hr over 30 Minutes Intravenous On call to O.R. 11/11/23 6644 11/11/23 1057     and started on DVT prophylaxis in the form of  Prasugrel and Aspirin .   PT and OT were ordered for total joint protocol. Discharge planning consulted to help with postop disposition and equipment needs.  Patient had a good night on the evening of surgery. They started to get up  OOB with therapy on POD #0. Pt was seen during rounds and was ready to go home pending progress with therapy. He worked with therapy on POD #1 and was meeting his goals. Pt was discharged to home later that day in stable condition.  Diet: Cardiac diet Activity: WBAT Follow-up: in 2 weeks Disposition: Home Discharged Condition: stable   Discharge Instructions     Call MD / Call 911   Complete by: As directed    If you experience chest pain or shortness of breath, CALL 911 and be transported to the hospital emergency room.  If you develope a fever above 101 F, pus (white drainage) or increased drainage or redness at the wound, or calf pain, call your surgeon's office.   Change dressing   Complete by: As directed    You may remove the bulky bandage (ACE wrap and gauze) two days after surgery. You will have an adhesive waterproof bandage underneath. Leave this in place until your first follow-up appointment.   Constipation Prevention   Complete by: As directed    Drink plenty of fluids.  Prune juice may be helpful.  You may use a stool softener, such as Colace (over the counter) 100 mg twice a day.  Use MiraLax (over the counter) for constipation as needed.   Diet - low sodium heart healthy   Complete by: As directed    Do not put a pillow under the knee. Place it under the heel.   Complete by: As directed    Driving restrictions   Complete by: As directed    No driving for two weeks   Post-operative opioid taper instructions:   Complete by: As directed    POST-OPERATIVE OPIOID TAPER  INSTRUCTIONS: It is important to wean off of your opioid medication as soon as possible. If you do not need pain medication after your surgery it is ok to stop day one. Opioids include: Codeine, Hydrocodone(Norco, Vicodin), Oxycodone(Percocet, oxycontin) and hydromorphone amongst others.  Long term and even short term use of opiods can cause: Increased pain response Dependence Constipation Depression Respiratory depression And more.  Withdrawal symptoms can include Flu like symptoms Nausea, vomiting And more Techniques to manage these symptoms Hydrate well Eat regular healthy meals Stay active Use relaxation techniques(deep breathing, meditating, yoga) Do Not substitute Alcohol to help with tapering If you have been on opioids for less than two weeks and do not have pain than it is ok to stop all together.  Plan to wean off of opioids This plan should start within one week post op of your joint replacement. Maintain the same interval or time between taking each dose and first decrease the dose.  Cut the total daily intake of opioids by one tablet each day Next start to increase the time between doses. The last dose that should be eliminated is the evening dose.      TED hose   Complete by: As directed    Use stockings (TED hose) for three weeks on both leg(s).  You may remove them at night for sleeping.   Weight bearing as tolerated   Complete by: As directed       Allergies as of 11/12/2023   No Known Allergies      Medication List     STOP taking these medications    baclofen 10 MG tablet Commonly known as: LIORESAL   celecoxib 200 MG capsule Commonly known as: CELEBREX   ibuprofen 200 MG tablet Commonly known as: ADVIL  TAKE these medications    acetaminophen 650 MG CR tablet Commonly known as: TYLENOL Take 650 mg by mouth in the morning and at bedtime.   aspirin EC 325 MG tablet Take 1 tablet (325 mg total) by mouth daily for 20 days. Then  take an 81 mg aspirin daily for 3 weeks. Then resume home regimen. What changed:  medication strength how much to take when to take this additional instructions   carvedilol 6.25 MG tablet Commonly known as: COREG Take 6.25 mg by mouth 2 (two) times daily.   Entresto 49-51 MG Generic drug: sacubitril-valsartan Take 1 tablet by mouth 2 (two) times daily.   escitalopram 20 MG tablet Commonly known as: LEXAPRO Take 20 mg by mouth in the morning.   gabapentin 300 MG capsule Commonly known as: NEURONTIN Take 1 capsule (300 mg total) by mouth 3 (three) times daily for 14 days. Then resume 300 mg BID. What changed:  when to take this additional instructions   Livalo 4 MG Tabs Generic drug: Pitavastatin Calcium Take 4 mg by mouth every evening.   methocarbamol 500 MG tablet Commonly known as: ROBAXIN Take 1 tablet (500 mg total) by mouth every 6 (six) hours as needed for muscle spasms.   nitroGLYCERIN 0.4 MG SL tablet Commonly known as: NITROSTAT Place 0.4 mg under the tongue every 5 (five) minutes x 3 doses as needed for chest pain.   ondansetron 4 MG tablet Commonly known as: ZOFRAN Take 1 tablet (4 mg total) by mouth every 6 (six) hours as needed for nausea.   oxyCODONE 5 MG immediate release tablet Commonly known as: Oxy IR/ROXICODONE Take 1-2 tablets (5-10 mg total) by mouth every 6 (six) hours as needed for severe pain (pain score 7-10).   pantoprazole 40 MG tablet Commonly known as: PROTONIX TAKE 1 TABLET BY MOUTH EVERY DAY   prasugrel 10 MG Tabs tablet Commonly known as: EFFIENT Take 10 mg by mouth in the morning.               Discharge Care Instructions  (From admission, onward)           Start     Ordered   11/12/23 0000  Weight bearing as tolerated        11/12/23 0745   11/12/23 0000  Change dressing       Comments: You may remove the bulky bandage (ACE wrap and gauze) two days after surgery. You will have an adhesive waterproof bandage  underneath. Leave this in place until your first follow-up appointment.   11/12/23 0745            Follow-up Information     Liliane Rei, MD Follow up in 2 week(s).   Specialty: Orthopedic Surgery Contact information: [address] [place] [address] [place] [address] [phone]                 Signed: R. Brinton Canavan, PA-C Orthopedic Surgery 11/13/2023, 8:26 AM

## 2024-03-02 ENCOUNTER — Inpatient Hospital Stay: Payer: 59

## 2024-03-02 ENCOUNTER — Inpatient Hospital Stay: Payer: 59 | Attending: Hematology | Admitting: Hematology

## 2024-03-02 DIAGNOSIS — Z833 Family history of diabetes mellitus: Secondary | ICD-10-CM | POA: Insufficient documentation

## 2024-03-02 DIAGNOSIS — Z8249 Family history of ischemic heart disease and other diseases of the circulatory system: Secondary | ICD-10-CM | POA: Insufficient documentation

## 2024-03-02 DIAGNOSIS — Z85028 Personal history of other malignant neoplasm of stomach: Secondary | ICD-10-CM | POA: Insufficient documentation

## 2024-03-02 DIAGNOSIS — F17210 Nicotine dependence, cigarettes, uncomplicated: Secondary | ICD-10-CM | POA: Insufficient documentation

## 2024-03-02 DIAGNOSIS — Z87442 Personal history of urinary calculi: Secondary | ICD-10-CM | POA: Insufficient documentation

## 2024-03-02 DIAGNOSIS — Z823 Family history of stroke: Secondary | ICD-10-CM | POA: Insufficient documentation

## 2024-03-02 DIAGNOSIS — C8305 Small cell B-cell lymphoma, lymph nodes of inguinal region and lower limb: Secondary | ICD-10-CM | POA: Insufficient documentation

## 2024-03-02 DIAGNOSIS — I2510 Atherosclerotic heart disease of native coronary artery without angina pectoris: Secondary | ICD-10-CM | POA: Insufficient documentation

## 2024-03-02 DIAGNOSIS — Z7969 Long term (current) use of other immunomodulators and immunosuppressants: Secondary | ICD-10-CM | POA: Insufficient documentation

## 2024-03-02 DIAGNOSIS — I252 Old myocardial infarction: Secondary | ICD-10-CM | POA: Insufficient documentation

## 2024-03-02 DIAGNOSIS — Z9049 Acquired absence of other specified parts of digestive tract: Secondary | ICD-10-CM | POA: Insufficient documentation

## 2024-03-02 DIAGNOSIS — Z9089 Acquired absence of other organs: Secondary | ICD-10-CM | POA: Insufficient documentation

## 2024-03-02 DIAGNOSIS — M25559 Pain in unspecified hip: Secondary | ICD-10-CM | POA: Insufficient documentation

## 2024-03-02 DIAGNOSIS — Z9221 Personal history of antineoplastic chemotherapy: Secondary | ICD-10-CM | POA: Insufficient documentation

## 2024-03-02 DIAGNOSIS — Z809 Family history of malignant neoplasm, unspecified: Secondary | ICD-10-CM | POA: Insufficient documentation

## 2024-03-02 DIAGNOSIS — Z83719 Family history of colon polyps, unspecified: Secondary | ICD-10-CM | POA: Insufficient documentation

## 2024-03-02 DIAGNOSIS — Z79899 Other long term (current) drug therapy: Secondary | ICD-10-CM | POA: Insufficient documentation

## 2024-03-02 NOTE — Progress Notes (Incomplete)
 [facility] [address], [place] [address]    Clinic Day:  03/02/2024  Referring physician: Paola Greig CROME, FNP  Patient Care Team: Paola Greig CROME, FNP as PCP - General (Family Medicine) Shaaron Lamar HERO, MD as Consulting Physician (Gastroenterology) Rogers Hai, MD as Consulting Physician (Hematology and Oncology)   ASSESSMENT & PLAN:   Assessment: 1.  Clinical stage IIIb small lymphocytic lymphoma: -6 cycles of obinutuzumab  and venetoclax  from 06/09/2019 through 10/27/2019. -PET scan on 11/23/2019 showed complete metabolic response to therapy of lymphoma.  Slight hypermetabolism in the right inguinal region from hernia surgery.  New hypermetabolic soft tissue fullness about the ischial tuberosity, possibly related to hamstring origin injury. -Venetoclax  was dose reduced to 200 mg daily secondary to carvedilol . -PET scan on June 20, 2020 with no findings suspicious for active lymphoma. -Venetoclax  discontinued in December 2021. - PET scan on 06/20/2020 did not show any evidence of lymphoma. - MRD flow cytometry on 06/13/2020 showed extremely small abnormal B-cell population, less than 0.01% of total cells.   2.  CAD: -Cardiac catheterization on 10/01/2019 found to have 100% occlusion of the RCA. -Angioplasty and stenting of the circumflex was done. -He was started on Effient  and Livalo . -Echocardiogram on 01/18/2020 by Dr. Cleotilde in [place] LVEF 50-55% with mild global hypokinesia, moderate LVH, normal RV function, stage I diastolic dysfunction.   3.  Right posterior hip pain: -MRI of the pelvis without contrast on 01/10/2020 shows complete tear of the right conjoined semitendinosus and biceps femoris tendon at the ischial tuberosity with 2.2 cm retraction.  High-grade partial tear of the right semimembranosus tendon at the ischial tuberosity. -He was evaluated by Dr. Josefina and conservative management recommended.  Plan: 1.  Clinical stage IIIb  small lymphocytic lymphoma: - Denies any fevers, night sweats or weight loss in the last 6 months.  No infections reported. - Physical exam: No palpable adenopathy or splenomegaly.  Mild tenderness in the right upper quadrant with no palpable mass. - Reviewed labs: Normal LFTs and LDH.  CBC was normal. - Recommend follow-up in 6 months with repeat labs and exam.  He is having knee replacement on the right side in April.  No orders of the defined types were placed in this encounter.     LILLETTE Verneta SAUNDERS Teague,acting as a Neurosurgeon for Hai Rogers, MD.,have documented all relevant documentation on the behalf of Hai Rogers, MD,as directed by  Hai Rogers, MD while in the presence of Hai Rogers, MD.  ***    [place] R Teague   8/4/20258:07 AM  CHIEF COMPLAINT:   Diagnosis: Small lymphocytic lymphoma    Cancer Staging  No matching staging information was found for the patient.    Prior Therapy: 1. Obinutuzumab  & venetoclax  x 6 cycles from 06/09/2019 to 10/27/2019. 2. Venetoclax  200 mg through 06/2020.  Current Therapy:  surveillance   HISTORY OF PRESENT ILLNESS:   Oncology History  Small lymphocytic lymphoma (HCC)  05/04/2019 Initial Diagnosis   Small lymphocytic lymphoma (HCC)   06/09/2019 - 10/27/2019 Chemotherapy   Patient is on Treatment Plan : LEUKEMIA CLL Obinutuzumab  q28d        INTERVAL HISTORY:   Aycen is a 63 y.o. male presenting to clinic today for follow up of Small lymphocytic lymphoma. He was last seen by me on 09/03/2023.  Since his last visit, he underwent right knee arthroplasty on 11/11/23 with Dr. Melodi.   Today, he states that he is doing well overall. His appetite level  is at ***%. His energy level is at ***%.  PAST MEDICAL HISTORY:   Past Medical History: Past Medical History:  Diagnosis Date   Arthritis    CAD (coronary artery disease)    Complication of anesthesia    Back surgery in 2003 - woke up after and had to  have a breathing tx- 6 hour surgery   History of chemotherapy    2022   History of kidney stones    HTN (hypertension)    Hypercholesterolemia    MI (myocardial infarction) (HCC) 2003   Small lymphocytic lymphoma (HCC)    Stomach cancer (HCC)     Surgical History: Past Surgical History:  Procedure Laterality Date   AXILLARY LYMPH NODE BIOPSY Left 04/27/2019   Procedure: AXILLARY LYMPH NODE BIOPSY;  Surgeon: Mavis Anes, MD;  Location: [facility];  Service: General;  Laterality: Left;   BIOPSY  11/26/2018   Procedure: BIOPSY;  Surgeon: Shaaron Lamar HERO, MD;  Location: [facility];  Service: Endoscopy;;   BIOPSY  03/08/2022   Procedure: BIOPSY;  Surgeon: Shaaron Lamar HERO, MD;  Location: [facility];  Service: Endoscopy;;   CHOLECYSTECTOMY N/A 05/18/2019   Procedure: LAPAROSCOPIC CHOLECYSTECTOMY;  Surgeon: Mavis Anes, MD;  Location: [facility];  Service: General;  Laterality: N/A;   COLONOSCOPY WITH PROPOFOL  N/A 03/08/2022   Procedure: COLONOSCOPY WITH PROPOFOL ;  Surgeon: Shaaron Lamar HERO, MD;  Location: [facility];  Service: Endoscopy;  Laterality: N/A;  8:45am   CORONARY ANGIOPLASTY WITH STENT PLACEMENT     2003   ESOPHAGOGASTRODUODENOSCOPY N/A 11/26/2018   mild erosive reflux esophagitis, gastric erythema. Negative H.pylori    ESOPHAGOGASTRODUODENOSCOPY (EGD) WITH PROPOFOL  N/A 03/08/2022   Procedure: ESOPHAGOGASTRODUODENOSCOPY (EGD) WITH PROPOFOL ;  Surgeon: Shaaron Lamar HERO, MD;  Location: [facility];  Service: Endoscopy;  Laterality: N/A;   INGUINAL HERNIA REPAIR Right 04/27/2019   Procedure: HERNIA REPAIR INGUINAL ADULT;  Surgeon: Mavis Anes, MD;  Location: [facility];  Service: General;  Laterality: Right;   KNEE SURGERY Right    lumbar fusion with cage  07/2017   NASAL SINUS SURGERY     TONSILLECTOMY AND ADENOIDECTOMY     TOTAL KNEE ARTHROPLASTY Right 11/11/2023   Procedure: ARTHROPLASTY, KNEE, TOTAL;  Surgeon: Melodi Lerner, MD;  Location: [facility];  Service: Orthopedics;   Laterality: Right;    Social History: Social History   Socioeconomic History   Marital status: Divorced    Spouse name: Not on file   Number of children: 2   Years of education: Not on file   Highest education level: Not on file  Occupational History   Occupation: employed    Comment: part time at [employer] home  Tobacco Use   Smoking status: Every Day    Current packs/day: 0.50    Average packs/day: 0.5 packs/day for 25.0 years (12.5 ttl pk-yrs)    Types: Cigarettes   Smokeless tobacco: Never  Vaping Use   Vaping status: Never Used  Substance and Sexual Activity   Alcohol use: Yes    Comment: occ   Drug use: Never   Sexual activity: Yes  Other Topics Concern   Not on file  Social History Narrative   Not on file   Social Drivers of Health   Financial Resource Strain: Low Risk  (06/22/2020)   Overall Financial Resource Strain (CARDIA)    Difficulty of Paying Living Expenses: Not hard at all  Food Insecurity: No Food Insecurity (11/11/2023)   Hunger Vital Sign    Worried  About Running Out of Food in the Last Year: Never true    Ran Out of Food in the Last Year: Never true  Transportation Needs: No Transportation Needs (11/11/2023)   PRAPARE - Administrator, Civil Service (Medical): No    Lack of Transportation (Non-Medical): No  Physical Activity: Inactive (06/22/2020)   Exercise Vital Sign    Days of Exercise per Week: 0 days    Minutes of Exercise per Session: 0 min  Stress: No Stress Concern Present (06/22/2020)   [employer] of Occupational Health - Occupational Stress Questionnaire    Feeling of Stress : Not at all  Social Connections: Socially Isolated (11/11/2023)   Social Connection and Isolation Panel    Frequency of Communication with Friends and Family: More than three times a week    Frequency of Social Gatherings with Friends and Family: More than three times a week    Attends Religious Services: Never    Database administrator or  Organizations: No    Attends Banker Meetings: Never    Marital Status: Divorced  Catering manager Violence: Not At Risk (11/11/2023)   Humiliation, Afraid, Rape, and Kick questionnaire    Fear of Current or Ex-Partner: No    Emotionally Abused: No    Physically Abused: No    Sexually Abused: No    Family History: Family History  Problem Relation Age of Onset   Colon polyps Father        thinks he may have had polyps, possibly in his 26s.    Heart attack Father    Dementia Mother    Hypotension Mother    Stroke Sister    Diabetes Maternal Grandmother    Stroke Maternal Grandfather    Cancer Paternal Grandmother    Congestive Heart Failure Paternal Grandfather    Kidney cancer Daughter    Colon cancer Neg Hx    Pancreatic cancer Neg Hx    Liver disease Neg Hx     Current Medications:  Current Outpatient Medications:    acetaminophen  (TYLENOL ) 650 MG CR tablet, Take 650 mg by mouth in the morning and at bedtime., Disp: , Rfl:    carvedilol  (COREG ) 6.25 MG tablet, Take 6.25 mg by mouth 2 (two) times daily., Disp: , Rfl:    ENTRESTO  49-51 MG, Take 1 tablet by mouth 2 (two) times daily., Disp: , Rfl:    escitalopram  (LEXAPRO ) 20 MG tablet, Take 20 mg by mouth in the morning., Disp: , Rfl:    methocarbamol  (ROBAXIN ) 500 MG tablet, Take 1 tablet (500 mg total) by mouth every 6 (six) hours as needed for muscle spasms., Disp: 40 tablet, Rfl: 0   nitroGLYCERIN  (NITROSTAT ) 0.4 MG SL tablet, Place 0.4 mg under the tongue every 5 (five) minutes x 3 doses as needed for chest pain., Disp: , Rfl:    ondansetron  (ZOFRAN ) 4 MG tablet, Take 1 tablet (4 mg total) by mouth every 6 (six) hours as needed for nausea., Disp: 20 tablet, Rfl: 0   oxyCODONE  (OXY IR/ROXICODONE ) 5 MG immediate release tablet, Take 1-2 tablets (5-10 mg total) by mouth every 6 (six) hours as needed for severe pain (pain score 7-10)., Disp: 42 tablet, Rfl: 0   pantoprazole  (PROTONIX ) 40 MG tablet, TAKE 1 TABLET  BY MOUTH EVERY DAY, Disp: 90 tablet, Rfl: 3   Pitavastatin  Calcium (LIVALO ) 4 MG TABS, Take 4 mg by mouth every evening., Disp: , Rfl:    prasugrel  (EFFIENT ) 10 MG TABS  tablet, Take 10 mg by mouth in the morning., Disp: , Rfl:    Allergies: No Known Allergies  REVIEW OF SYSTEMS:   Review of Systems  Constitutional:  Negative for chills, fatigue and fever.  HENT:   Negative for lump/mass, mouth sores, nosebleeds, sore throat and trouble swallowing.   Eyes:  Negative for eye problems.  Respiratory:  Negative for cough and shortness of breath.   Cardiovascular:  Negative for chest pain, leg swelling and palpitations.  Gastrointestinal:  Negative for abdominal pain, constipation, diarrhea, nausea and vomiting.  Genitourinary:  Negative for bladder incontinence, difficulty urinating, dysuria, frequency, hematuria and nocturia.   Musculoskeletal:  Negative for arthralgias, back pain, flank pain, myalgias and neck pain.  Skin:  Negative for itching and rash.  Neurological:  Negative for dizziness, headaches and numbness.  Hematological:  Does not bruise/bleed easily.  Psychiatric/Behavioral:  Negative for depression, sleep disturbance and suicidal ideas. The patient is not nervous/anxious.   All other systems reviewed and are negative.    VITALS:   There were no vitals taken for this visit.  Wt Readings from Last 3 Encounters:  11/11/23 207 lb (93.9 kg)  11/04/23 207 lb (93.9 kg)  09/03/23 204 lb 5.9 oz (92.7 kg)    There is no height or weight on file to calculate BMI.  Performance status (ECOG): 1 - Symptomatic but completely ambulatory  PHYSICAL EXAM:   Physical Exam Vitals and nursing note reviewed. Exam conducted with a chaperone present.  Constitutional:      Appearance: Normal appearance.  Cardiovascular:     Rate and Rhythm: Normal rate and regular rhythm.     Pulses: Normal pulses.     Heart sounds: Normal heart sounds.  Pulmonary:     Effort: Pulmonary effort is  normal.     Breath sounds: Normal breath sounds.  Abdominal:     Palpations: Abdomen is soft. There is no hepatomegaly, splenomegaly or mass.     Tenderness: There is no abdominal tenderness.  Musculoskeletal:     Right lower leg: No edema.     Left lower leg: No edema.  Lymphadenopathy:     Cervical: No cervical adenopathy.     Right cervical: No superficial, deep or posterior cervical adenopathy.    Left cervical: No superficial, deep or posterior cervical adenopathy.     Upper Body:     Right upper body: No supraclavicular or axillary adenopathy.     Left upper body: No supraclavicular or axillary adenopathy.  Neurological:     General: No focal deficit present.     Mental Status: He is alert and oriented to person, place, and time.  Psychiatric:        Mood and Affect: Mood normal.        Behavior: Behavior normal.     LABS:      Latest Ref Rng & Units 11/12/2023    3:24 AM 11/04/2023    9:04 AM 09/03/2023   12:46 PM  CBC  WBC 4.0 - 10.5 K/uL 13.9  8.4  8.6   Hemoglobin 13.0 - 17.0 g/dL 89.1  86.6  85.4   Hematocrit 39.0 - 52.0 % 33.8  40.3  42.7   Platelets 150 - 400 K/uL 162  163  197       Latest Ref Rng & Units 11/12/2023    3:24 AM 11/04/2023    9:04 AM 09/03/2023   12:46 PM  CMP  Glucose 70 - 99 mg/dL 862  84  101   BUN 8 - 23 mg/dL 18  16  14    Creatinine 0.61 - 1.24 mg/dL 8.75  9.40  9.33   Sodium 135 - 145 mmol/L 136  138  139   Potassium 3.5 - 5.1 mmol/L 4.6  3.8  4.3   Chloride 98 - 111 mmol/L 105  106  106   CO2 22 - 32 mmol/L 24  24  25    Calcium 8.9 - 10.3 mg/dL 8.6  8.8  9.0   Total Protein 6.5 - 8.1 g/dL   7.0   Total Bilirubin 0.0 - 1.2 mg/dL   1.0   Alkaline Phos 38 - 126 U/L   56   AST 15 - 41 U/L   21   ALT 0 - 44 U/L   17      No results found for: CEA1, CEA / No results found for: CEA1, CEA No results found for: PSA1 No results found for: CAN199 No results found for: CAN125  No results found for: TOTALPROTELP,  ALBUMINELP, A1GS, A2GS, BETS, BETA2SER, GAMS, MSPIKE, SPEI Lab Results  Component Value Date   TIBC 310 11/20/2021   FERRITIN 53 11/20/2021   IRONPCTSAT 7 (L) 11/20/2021   Lab Results  Component Value Date   LDH 188 09/03/2023   LDH 151 02/27/2023   LDH 148 08/29/2022     STUDIES:   No results found.

## 2024-03-05 ENCOUNTER — Inpatient Hospital Stay: Admitting: Hematology

## 2024-03-05 ENCOUNTER — Inpatient Hospital Stay

## 2024-03-05 VITALS — BP 128/87 | HR 52 | Temp 98.3°F | Resp 18 | Wt 205.0 lb

## 2024-03-05 DIAGNOSIS — I252 Old myocardial infarction: Secondary | ICD-10-CM | POA: Diagnosis not present

## 2024-03-05 DIAGNOSIS — Z87442 Personal history of urinary calculi: Secondary | ICD-10-CM | POA: Diagnosis not present

## 2024-03-05 DIAGNOSIS — C8300 Small cell B-cell lymphoma, unspecified site: Secondary | ICD-10-CM | POA: Diagnosis not present

## 2024-03-05 DIAGNOSIS — Z85028 Personal history of other malignant neoplasm of stomach: Secondary | ICD-10-CM | POA: Diagnosis not present

## 2024-03-05 DIAGNOSIS — Z79899 Other long term (current) drug therapy: Secondary | ICD-10-CM | POA: Diagnosis not present

## 2024-03-05 DIAGNOSIS — Z8249 Family history of ischemic heart disease and other diseases of the circulatory system: Secondary | ICD-10-CM | POA: Diagnosis not present

## 2024-03-05 DIAGNOSIS — M25559 Pain in unspecified hip: Secondary | ICD-10-CM | POA: Diagnosis not present

## 2024-03-05 DIAGNOSIS — Z9089 Acquired absence of other organs: Secondary | ICD-10-CM | POA: Diagnosis not present

## 2024-03-05 DIAGNOSIS — Z7969 Long term (current) use of other immunomodulators and immunosuppressants: Secondary | ICD-10-CM | POA: Diagnosis not present

## 2024-03-05 DIAGNOSIS — Z9049 Acquired absence of other specified parts of digestive tract: Secondary | ICD-10-CM | POA: Diagnosis not present

## 2024-03-05 DIAGNOSIS — C8305 Small cell B-cell lymphoma, lymph nodes of inguinal region and lower limb: Secondary | ICD-10-CM | POA: Diagnosis present

## 2024-03-05 DIAGNOSIS — I2510 Atherosclerotic heart disease of native coronary artery without angina pectoris: Secondary | ICD-10-CM | POA: Diagnosis not present

## 2024-03-05 DIAGNOSIS — Z809 Family history of malignant neoplasm, unspecified: Secondary | ICD-10-CM | POA: Diagnosis not present

## 2024-03-05 DIAGNOSIS — Z833 Family history of diabetes mellitus: Secondary | ICD-10-CM | POA: Diagnosis not present

## 2024-03-05 DIAGNOSIS — Z9221 Personal history of antineoplastic chemotherapy: Secondary | ICD-10-CM | POA: Diagnosis not present

## 2024-03-05 DIAGNOSIS — Z83719 Family history of colon polyps, unspecified: Secondary | ICD-10-CM | POA: Diagnosis not present

## 2024-03-05 DIAGNOSIS — F17210 Nicotine dependence, cigarettes, uncomplicated: Secondary | ICD-10-CM | POA: Diagnosis not present

## 2024-03-05 DIAGNOSIS — Z823 Family history of stroke: Secondary | ICD-10-CM | POA: Diagnosis not present

## 2024-03-05 LAB — CBC WITH DIFFERENTIAL/PLATELET
Abs Immature Granulocytes: 0.02 K/uL (ref 0.00–0.07)
Basophils Absolute: 0.1 K/uL (ref 0.0–0.1)
Basophils Relative: 1 %
Eosinophils Absolute: 0.2 K/uL (ref 0.0–0.5)
Eosinophils Relative: 3 %
HCT: 40.1 % (ref 39.0–52.0)
Hemoglobin: 13.2 g/dL (ref 13.0–17.0)
Immature Granulocytes: 0 %
Lymphocytes Relative: 44 %
Lymphs Abs: 3 K/uL (ref 0.7–4.0)
MCH: 30.9 pg (ref 26.0–34.0)
MCHC: 32.9 g/dL (ref 30.0–36.0)
MCV: 93.9 fL (ref 80.0–100.0)
Monocytes Absolute: 0.6 K/uL (ref 0.1–1.0)
Monocytes Relative: 9 %
Neutro Abs: 3.1 K/uL (ref 1.7–7.7)
Neutrophils Relative %: 43 %
Platelets: 172 K/uL (ref 150–400)
RBC: 4.27 MIL/uL (ref 4.22–5.81)
RDW: 13.2 % (ref 11.5–15.5)
WBC: 6.9 K/uL (ref 4.0–10.5)
nRBC: 0 % (ref 0.0–0.2)

## 2024-03-05 LAB — COMPREHENSIVE METABOLIC PANEL WITH GFR
ALT: 13 U/L (ref 0–44)
AST: 16 U/L (ref 15–41)
Albumin: 3.7 g/dL (ref 3.5–5.0)
Alkaline Phosphatase: 60 U/L (ref 38–126)
Anion gap: 9 (ref 5–15)
BUN: 15 mg/dL (ref 8–23)
CO2: 23 mmol/L (ref 22–32)
Calcium: 8.8 mg/dL — ABNORMAL LOW (ref 8.9–10.3)
Chloride: 104 mmol/L (ref 98–111)
Creatinine, Ser: 0.84 mg/dL (ref 0.61–1.24)
GFR, Estimated: >60 mL/min (ref >60)
Glucose, Bld: 76 mg/dL (ref 70–99)
Potassium: 4.3 mmol/L (ref 3.5–5.1)
Sodium: 136 mmol/L (ref 135–145)
Total Bilirubin: 0.8 mg/dL (ref 0.0–1.2)
Total Protein: 6.4 g/dL — ABNORMAL LOW (ref 6.5–8.1)

## 2024-03-05 LAB — LACTATE DEHYDROGENASE: LDH: 140 U/L (ref 98–192)

## 2024-03-05 NOTE — Progress Notes (Signed)
 [facility] [address], [place] [address]    Clinic Day:  03/05/2024  Referring physician: Paola Greig CROME, FNP  Patient Care Team: Paola Greig CROME, FNP as PCP - General (Family Medicine) Shaaron Lamar HERO, MD as Consulting Physician (Gastroenterology) Rogers Hai, MD as Consulting Physician (Hematology and Oncology)   ASSESSMENT & PLAN:   Assessment: 1.  Clinical stage IIIb small lymphocytic lymphoma: -6 cycles of obinutuzumab  and venetoclax  from 06/09/2019 through 10/27/2019. -PET scan on 11/23/2019 showed complete metabolic response to therapy of lymphoma.  Slight hypermetabolism in the right inguinal region from hernia surgery.  New hypermetabolic soft tissue fullness about the ischial tuberosity, possibly related to hamstring origin injury. -Venetoclax  was dose reduced to 200 mg daily secondary to carvedilol . -PET scan on June 20, 2020 with no findings suspicious for active lymphoma. -Venetoclax  discontinued in December 2021. - PET scan on 06/20/2020 did not show any evidence of lymphoma. - MRD flow cytometry on 06/13/2020 showed extremely small abnormal B-cell population, less than 0.01% of total cells.   2.  CAD: -Cardiac catheterization on 10/01/2019 found to have 100% occlusion of the RCA. -Angioplasty and stenting of the circumflex was done. -He was started on Effient  and Livalo . -Echocardiogram on 01/18/2020 by Dr. Cleotilde in [place] LVEF 50-55% with mild global hypokinesia, moderate LVH, normal RV function, stage I diastolic dysfunction.   3.  Right posterior hip pain: -MRI of the pelvis without contrast on 01/10/2020 shows complete tear of the right conjoined semitendinosus and biceps femoris tendon at the ischial tuberosity with 2.2 cm retraction.  High-grade partial tear of the right semimembranosus tendon at the ischial tuberosity. -He was evaluated by Dr. Josefina and conservative management recommended.  Plan: 1.  Clinical stage IIIb  small lymphocytic lymphoma: - Denies any B symptoms in the last 6 months.  No infections reported. - Physical exam: No palpable adenopathy or splenomegaly. - Labs: LFTs, LDH and CBC are normal. - He continues to be in remission.  Recommend follow-up in 1 year with repeat labs and exam.  Orders Placed This Encounter  Procedures   CBC with Differential/Platelet    Standing Status:   Future    Expected Date:   03/05/2025    Expiration Date:   06/03/2025    Release to patient:   Immediate   Comprehensive metabolic panel with GFR    Standing Status:   Future    Expected Date:   03/05/2025    Expiration Date:   06/03/2025    Release to patient:   Immediate   Lactate dehydrogenase    Standing Status:   Future    Expected Date:   03/05/2025    Expiration Date:   06/03/2025    Release to patient:   Immediate      I,Helena R Teague,acting as a scribe for Hai Rogers, MD.,have documented all relevant documentation on the behalf of Hai Rogers, MD,as directed by  Hai Rogers, MD while in the presence of Hai Rogers, MD.  I, Hai Rogers MD, have reviewed the above documentation for accuracy and completeness, and I agree with the above.     Hai Rogers, MD   8/7/202512:49 PM  CHIEF COMPLAINT:   Diagnosis: Small lymphocytic lymphoma    Cancer Staging  No matching staging information was found for the patient.    Prior Therapy: 1. Obinutuzumab  & venetoclax  x 6 cycles from 06/09/2019 to 10/27/2019. 2. Venetoclax  200 mg through 06/2020.  Current Therapy:  surveillance  HISTORY OF PRESENT ILLNESS:   Oncology History  Small lymphocytic lymphoma (HCC)  05/04/2019 Initial Diagnosis   Small lymphocytic lymphoma (HCC)   06/09/2019 - 10/27/2019 Chemotherapy   Patient is on Treatment Plan : LEUKEMIA CLL Obinutuzumab  q28d        INTERVAL HISTORY:   Patrick Rubio is a 63 y.o. male presenting to clinic today for follow up of Small lymphocytic  lymphoma. He was last seen by me on 09/03/2023.  Today, he states that he is doing well overall. His appetite level is at 100%. His energy level is at 50%. Patrick Rubio denies any infections, fevers, night sweats, or unintentional weight loss in the last 6 months.  Prior to treatment for lymphoma, he experienced GI issues and abdominal pain. Damire has regular follow-up with his PCP every 6 months.    PAST MEDICAL HISTORY:   Past Medical History: Past Medical History:  Diagnosis Date   Arthritis    CAD (coronary artery disease)    Complication of anesthesia    Back surgery in 2003 - woke up after and had to have a breathing tx- 6 hour surgery   History of chemotherapy    2022   History of kidney stones    HTN (hypertension)    Hypercholesterolemia    MI (myocardial infarction) (HCC) 2003   Small lymphocytic lymphoma (HCC)    Stomach cancer (HCC)     Surgical History: Past Surgical History:  Procedure Laterality Date   AXILLARY LYMPH NODE BIOPSY Left 04/27/2019   Procedure: AXILLARY LYMPH NODE BIOPSY;  Surgeon: Mavis Anes, MD;  Location: [facility];  Service: General;  Laterality: Left;   BIOPSY  11/26/2018   Procedure: BIOPSY;  Surgeon: Shaaron Lamar HERO, MD;  Location: [facility];  Service: Endoscopy;;   BIOPSY  03/08/2022   Procedure: BIOPSY;  Surgeon: Shaaron Lamar HERO, MD;  Location: [facility];  Service: Endoscopy;;   CHOLECYSTECTOMY N/A 05/18/2019   Procedure: LAPAROSCOPIC CHOLECYSTECTOMY;  Surgeon: Mavis Anes, MD;  Location: [facility];  Service: General;  Laterality: N/A;   COLONOSCOPY WITH PROPOFOL  N/A 03/08/2022   Procedure: COLONOSCOPY WITH PROPOFOL ;  Surgeon: Shaaron Lamar HERO, MD;  Location: [facility];  Service: Endoscopy;  Laterality: N/A;  8:45am   CORONARY ANGIOPLASTY WITH STENT PLACEMENT     2003   ESOPHAGOGASTRODUODENOSCOPY N/A 11/26/2018   mild erosive reflux esophagitis, gastric erythema. Negative H.pylori    ESOPHAGOGASTRODUODENOSCOPY (EGD) WITH PROPOFOL  N/A  03/08/2022   Procedure: ESOPHAGOGASTRODUODENOSCOPY (EGD) WITH PROPOFOL ;  Surgeon: Shaaron Lamar HERO, MD;  Location: [facility];  Service: Endoscopy;  Laterality: N/A;   INGUINAL HERNIA REPAIR Right 04/27/2019   Procedure: HERNIA REPAIR INGUINAL ADULT;  Surgeon: Mavis Anes, MD;  Location: [facility];  Service: General;  Laterality: Right;   KNEE SURGERY Right    lumbar fusion with cage  07/2017   NASAL SINUS SURGERY     TONSILLECTOMY AND ADENOIDECTOMY     TOTAL KNEE ARTHROPLASTY Right 11/11/2023   Procedure: ARTHROPLASTY, KNEE, TOTAL;  Surgeon: Melodi Lerner, MD;  Location: [facility];  Service: Orthopedics;  Laterality: Right;    Social History: Social History   Socioeconomic History   Marital status: Divorced    Spouse name: Not on file   Number of children: 2   Years of education: Not on file   Highest education level: Not on file  Occupational History   Occupation: employed    Comment: part time at [employer] home  Tobacco Use   Smoking status: Every Day  Current packs/day: 0.50    Average packs/day: 0.5 packs/day for 25.0 years (12.5 ttl pk-yrs)    Types: Cigarettes   Smokeless tobacco: Never  Vaping Use   Vaping status: Never Used  Substance and Sexual Activity   Alcohol use: Yes    Comment: occ   Drug use: Never   Sexual activity: Yes  Other Topics Concern   Not on file  Social History Narrative   Not on file   Social Drivers of Health   Financial Resource Strain: Low Risk  (06/22/2020)   Overall Financial Resource Strain (CARDIA)    Difficulty of Paying Living Expenses: Not hard at all  Food Insecurity: No Food Insecurity (11/11/2023)   Hunger Vital Sign    Worried About Running Out of Food in the Last Year: Never true    Ran Out of Food in the Last Year: Never true  Transportation Needs: No Transportation Needs (11/11/2023)   PRAPARE - Administrator, Civil Service (Medical): No    Lack of Transportation (Non-Medical): No  Physical Activity: Inactive  (06/22/2020)   Exercise Vital Sign    Days of Exercise per Week: 0 days    Minutes of Exercise per Session: 0 min  Stress: No Stress Concern Present (06/22/2020)   [employer] of Occupational Health - Occupational Stress Questionnaire    Feeling of Stress : Not at all  Social Connections: Socially Isolated (11/11/2023)   Social Connection and Isolation Panel    Frequency of Communication with Friends and Family: More than three times a week    Frequency of Social Gatherings with Friends and Family: More than three times a week    Attends Religious Services: Never    Database administrator or Organizations: No    Attends Banker Meetings: Never    Marital Status: Divorced  Catering manager Violence: Not At Risk (11/11/2023)   Humiliation, Afraid, Rape, and Kick questionnaire    Fear of Current or Ex-Partner: No    Emotionally Abused: No    Physically Abused: No    Sexually Abused: No    Family History: Family History  Problem Relation Age of Onset   Colon polyps Father        thinks he may have had polyps, possibly in his 68s.    Heart attack Father    Dementia Mother    Hypotension Mother    Stroke Sister    Diabetes Maternal Grandmother    Stroke Maternal Grandfather    Cancer Paternal Grandmother    Congestive Heart Failure Paternal Grandfather    Kidney cancer Daughter    Colon cancer Neg Hx    Pancreatic cancer Neg Hx    Liver disease Neg Hx     Current Medications:  Current Outpatient Medications:    acetaminophen  (TYLENOL ) 650 MG CR tablet, Take 650 mg by mouth in the morning and at bedtime., Disp: , Rfl:    aspirin  81 MG chewable tablet, Chew 1 tablet by mouth every Monday, Wednesday, and Friday., Disp: , Rfl:    carvedilol  (COREG ) 6.25 MG tablet, Take 6.25 mg by mouth 2 (two) times daily., Disp: , Rfl:    ENTRESTO  49-51 MG, Take 1 tablet by mouth 2 (two) times daily., Disp: , Rfl:    escitalopram  (LEXAPRO ) 20 MG tablet, Take 20 mg by mouth in  the morning., Disp: , Rfl:    methocarbamol  (ROBAXIN ) 500 MG tablet, Take 1 tablet (500 mg total) by mouth every 6 (six)  hours as needed for muscle spasms., Disp: 40 tablet, Rfl: 0   nitroGLYCERIN  (NITROSTAT ) 0.4 MG SL tablet, Place 0.4 mg under the tongue every 5 (five) minutes x 3 doses as needed for chest pain., Disp: , Rfl:    ondansetron  (ZOFRAN ) 4 MG tablet, Take 1 tablet (4 mg total) by mouth every 6 (six) hours as needed for nausea., Disp: 20 tablet, Rfl: 0   oxyCODONE  (OXY IR/ROXICODONE ) 5 MG immediate release tablet, Take 1-2 tablets (5-10 mg total) by mouth every 6 (six) hours as needed for severe pain (pain score 7-10)., Disp: 42 tablet, Rfl: 0   pantoprazole  (PROTONIX ) 40 MG tablet, TAKE 1 TABLET BY MOUTH EVERY DAY, Disp: 90 tablet, Rfl: 3   Pitavastatin  Calcium (LIVALO ) 4 MG TABS, Take 4 mg by mouth every evening., Disp: , Rfl:    prasugrel  (EFFIENT ) 10 MG TABS tablet, Take 10 mg by mouth in the morning., Disp: , Rfl:    Allergies: No Known Allergies  REVIEW OF SYSTEMS:   Review of Systems  Constitutional:  Negative for chills, fatigue and fever.  HENT:   Negative for lump/mass, mouth sores, nosebleeds, sore throat and trouble swallowing.   Eyes:  Negative for eye problems.  Respiratory:  Negative for cough and shortness of breath.   Cardiovascular:  Negative for chest pain, leg swelling and palpitations.  Gastrointestinal:  Negative for abdominal pain, constipation, diarrhea, nausea and vomiting.  Genitourinary:  Negative for bladder incontinence, difficulty urinating, dysuria, frequency, hematuria and nocturia.   Musculoskeletal:  Negative for arthralgias, back pain, flank pain, myalgias and neck pain.  Skin:  Negative for itching and rash.  Neurological:  Negative for dizziness, headaches and numbness.  Hematological:  Does not bruise/bleed easily.  Psychiatric/Behavioral:  Negative for depression, sleep disturbance and suicidal ideas. The patient is not nervous/anxious.    All other systems reviewed and are negative.    VITALS:   Blood pressure 128/87, pulse (!) 52, temperature 98.3 F (36.8 C), temperature source Oral, resp. rate 18, weight 205 lb (93 kg), SpO2 98%.  Wt Readings from Last 3 Encounters:  03/05/24 205 lb (93 kg)  11/11/23 207 lb (93.9 kg)  11/04/23 207 lb (93.9 kg)    Body mass index is 29.41 kg/m.  Performance status (ECOG): 1 - Symptomatic but completely ambulatory  PHYSICAL EXAM:   Physical Exam Vitals and nursing note reviewed. Exam conducted with a chaperone present.  Constitutional:      Appearance: Normal appearance.  Cardiovascular:     Rate and Rhythm: Normal rate and regular rhythm.     Pulses: Normal pulses.     Heart sounds: Normal heart sounds.  Pulmonary:     Effort: Pulmonary effort is normal.     Breath sounds: Normal breath sounds.  Abdominal:     Palpations: Abdomen is soft. There is no hepatomegaly, splenomegaly or mass.     Tenderness: There is no abdominal tenderness.  Musculoskeletal:     Right lower leg: No edema.     Left lower leg: No edema.  Lymphadenopathy:     Cervical: No cervical adenopathy.     Right cervical: No superficial, deep or posterior cervical adenopathy.    Left cervical: No superficial, deep or posterior cervical adenopathy.     Upper Body:     Right upper body: No supraclavicular or axillary adenopathy.     Left upper body: No supraclavicular or axillary adenopathy.  Neurological:     General: No focal deficit present.  Mental Status: He is alert and oriented to person, place, and time.  Psychiatric:        Mood and Affect: Mood normal.        Behavior: Behavior normal.     LABS:      Latest Ref Rng & Units 03/05/2024   11:11 AM 11/12/2023    3:24 AM 11/04/2023    9:04 AM  CBC  WBC 4.0 - 10.5 K/uL 6.9  13.9  8.4   Hemoglobin 13.0 - 17.0 g/dL 86.7  89.1  86.6   Hematocrit 39.0 - 52.0 % 40.1  33.8  40.3   Platelets 150 - 400 K/uL 172  162  163       Latest Ref Rng  & Units 03/05/2024   11:11 AM 11/12/2023    3:24 AM 11/04/2023    9:04 AM  CMP  Glucose 70 - 99 mg/dL 76  862  84   BUN 8 - 23 mg/dL 15  18  16    Creatinine 0.61 - 1.24 mg/dL 9.15  8.75  9.40   Sodium 135 - 145 mmol/L 136  136  138   Potassium 3.5 - 5.1 mmol/L 4.3  4.6  3.8   Chloride 98 - 111 mmol/L 104  105  106   CO2 22 - 32 mmol/L 23  24  24    Calcium 8.9 - 10.3 mg/dL 8.8  8.6  8.8   Total Protein 6.5 - 8.1 g/dL 6.4     Total Bilirubin 0.0 - 1.2 mg/dL 0.8     Alkaline Phos 38 - 126 U/L 60     AST 15 - 41 U/L 16     ALT 0 - 44 U/L 13        No results found for: CEA1, CEA / No results found for: CEA1, CEA No results found for: PSA1 No results found for: CAN199 No results found for: CAN125  No results found for: TOTALPROTELP, ALBUMINELP, A1GS, A2GS, BETS, BETA2SER, GAMS, MSPIKE, SPEI Lab Results  Component Value Date   TIBC 310 11/20/2021   FERRITIN 53 11/20/2021   IRONPCTSAT 7 (L) 11/20/2021   Lab Results  Component Value Date   LDH 140 03/05/2024   LDH 188 09/03/2023   LDH 151 02/27/2023     STUDIES:   No results found.

## 2024-03-05 NOTE — Patient Instructions (Signed)
[facility]  Discharge Instructions  You were seen and examined today by Dr. Ellin Saba.  Dr. Ellin Saba discussed your most recent lab work which revealed that everything looks good and stable.  Follow-up as scheduled.    Thank you for choosing [facility] to provide your oncology and hematology care.   To afford each patient quality time with our provider, please arrive at least 15 minutes before your scheduled appointment time. You may need to reschedule your appointment if you arrive late (10 or more minutes). Arriving late affects you and other patients whose appointments are after yours.  Also, if you miss three or more appointments without notifying the office, you may be dismissed from the clinic at the provider's discretion.    Again, thank you for choosing [facility].  Our hope is that these requests will decrease the amount of time that you wait before being seen by our physicians.   If you have a lab appointment with the Cancer Center - please note that after April 8th, all labs will be drawn in the cancer center.  You do not have to check in or register with the main entrance as you have in the past but will complete your check-in at the cancer center.            _____________________________________________________________  Should you have questions after your visit to [facility], please contact our office at [phone] and follow the prompts.  Our office hours are 8:00 a.m. to 4:30 p.m. Monday - Thursday and 8:00 a.m. to 2:30 p.m. Friday.  Please note that voicemails left after 4:00 p.m. may not be returned until the following business day.  We are closed weekends and all major holidays.  You do have access to a nurse 24-7, just call the main number to the clinic [phone] and do not press any options, hold on the line and a nurse will answer the phone.    For prescription refill requests,  have your pharmacy contact our office and allow 72 hours.    Masks are no longer required in the cancer centers. If you would like for your care team to wear a mask while they are taking care of you, please let them know. You may have one support person who is at least 65 years old accompany you for your appointments.

## 2024-05-06 NOTE — H&P (Signed)
 TOTAL KNEE ADMISSION H&P  Patient is being admitted for left total knee arthroplasty.  Subjective:  Chief Complaint: Left knee pain.  HPI: Patrick Rubio, 63 y.o. male has a history of pain and functional disability in the left knee due to arthritis and has failed non-surgical conservative treatments for greater than 12 weeks to include corticosteriod injections, viscosupplementation injections, and activity modification. Onset of symptoms was gradual, starting several years ago with gradually worsening course since that time. The patient noted no past surgery on the left knee.  Patient currently rates pain in the left knee at 7 out of 10 with activity. Patient has worsening of pain with activity and weight bearing and pain that interferes with activities of daily living. Patient has evidence of periarticular osteophytes and joint space narrowing by imaging studies. There is no active infection.  Patient Active Problem List   Diagnosis Date Noted   OA (osteoarthritis) of knee 11/11/2023   Primary osteoarthritis of right knee 11/11/2023   Abdominal pain, epigastric 04/17/2022   Chronic diarrhea 01/31/2022   Salmonella enteritis    Rectal bleeding    Hematochezia 11/19/2021   Chronic combined systolic and diastolic CHF (congestive heart failure) (HCC) 11/19/2021   Mixed hyperlipidemia 11/19/2021   Transaminasemia 11/19/2021   GERD (gastroesophageal reflux disease) 11/18/2021   CAD (coronary artery disease) 11/18/2021   Essential hypertension 11/18/2021   Anemia, chronic disease 02/08/2021   Bile salt-induced diarrhea 02/08/2021   Goals of care, counseling/discussion 05/26/2019   Calculus of gallbladder without cholecystitis without obstruction    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   Right inguinal hernia    Generalized lymphadenopathy    Lymphadenopathy, mesenteric 04/02/2019   RUQ pain 03/11/2019   Dyspepsia 11/12/2018   Loss of weight 11/12/2018    Past Medical History:   Diagnosis Date   Arthritis    CAD (coronary artery disease)    Complication of anesthesia    Back surgery in 2003 - woke up after and had to have a breathing tx- 6 hour surgery   History of chemotherapy    2022   History of kidney stones    HTN (hypertension)    Hypercholesterolemia    MI (myocardial infarction) (HCC) 2003   Small lymphocytic lymphoma (HCC)    Stomach cancer (HCC)     Past Surgical History:  Procedure Laterality Date   AXILLARY LYMPH NODE BIOPSY Left 04/27/2019   Procedure: AXILLARY LYMPH NODE BIOPSY;  Surgeon: Mavis Anes, MD;  Location: [facility];  Service: General;  Laterality: Left;   BIOPSY  11/26/2018   Procedure: BIOPSY;  Surgeon: Shaaron Lamar HERO, MD;  Location: [facility];  Service: Endoscopy;;   BIOPSY  03/08/2022   Procedure: BIOPSY;  Surgeon: Shaaron Lamar HERO, MD;  Location: [facility];  Service: Endoscopy;;   CHOLECYSTECTOMY N/A 05/18/2019   Procedure: LAPAROSCOPIC CHOLECYSTECTOMY;  Surgeon: Mavis Anes, MD;  Location: [facility];  Service: General;  Laterality: N/A;   COLONOSCOPY WITH PROPOFOL  N/A 03/08/2022   Procedure: COLONOSCOPY WITH PROPOFOL ;  Surgeon: Shaaron Lamar HERO, MD;  [facility];  Service: Endoscopy;  Laterality: N/A;  8:45am   CORONARY ANGIOPLASTY WITH STENT PLACEMENT     2003   ESOPHAGOGASTRODUODENOSCOPY N/A 11/26/2018   mild erosive reflux esophagitis, gastric erythema. Negative H.pylori    ESOPHAGOGASTRODUODENOSCOPY (EGD) WITH PROPOFOL  N/A 03/08/2022   Procedure: ESOPHAGOGASTRODUODENOSCOPY (EGD) WITH PROPOFOL ;  Surgeon: Shaaron Lamar HERO, MD;  [facility];  Service: Endoscopy;  Laterality: N/A;   INGUINAL HERNIA  REPAIR Right 04/27/2019   Procedure: HERNIA REPAIR INGUINAL ADULT;  Surgeon: Mavis Anes, MD;  Location: [facility];  Service: General;  Laterality: Right;   KNEE SURGERY Right    lumbar fusion with cage  07/2017   NASAL SINUS SURGERY     TONSILLECTOMY AND ADENOIDECTOMY     TOTAL KNEE ARTHROPLASTY Right  11/11/2023   Procedure: ARTHROPLASTY, KNEE, TOTAL;  Surgeon: Melodi Lerner, MD;  Location: [facility];  Service: Orthopedics;  Laterality: Right;    Prior to Admission medications   Medication Sig Start Date End Date Taking? Authorizing Provider  acetaminophen  (TYLENOL ) 650 MG CR tablet Take 650 mg by mouth in the morning and at bedtime.    [provider]  aspirin  81 MG chewable tablet Chew 1 tablet by mouth every Monday, Wednesday, and Friday.    [provider]  carvedilol  (COREG ) 6.25 MG tablet Take 6.25 mg by mouth 2 (two) times daily. 05/25/20   [provider]  ENTRESTO  49-51 MG Take 1 tablet by mouth 2 (two) times daily. 08/21/22   [provider]  escitalopram  (LEXAPRO ) 20 MG tablet Take 20 mg by mouth in the morning. 10/06/18   [provider]  methocarbamol  (ROBAXIN ) 500 MG tablet Take 1 tablet (500 mg total) by mouth every 6 (six) hours as needed for muscle spasms. 11/12/23   Kristian Stabs, PA  nitroGLYCERIN  (NITROSTAT ) 0.4 MG SL tablet Place 0.4 mg under the tongue every 5 (five) minutes x 3 doses as needed for chest pain.    [provider]  ondansetron  (ZOFRAN ) 4 MG tablet Take 1 tablet (4 mg total) by mouth every 6 (six) hours as needed for nausea. 11/12/23   Kristian Stabs, PA  oxyCODONE  (OXY IR/ROXICODONE ) 5 MG immediate release tablet Take 1-2 tablets (5-10 mg total) by mouth every 6 (six) hours as needed for severe pain (pain score 7-10). 11/12/23   Kristian Stabs, PA  pantoprazole  (PROTONIX ) 40 MG tablet TAKE 1 TABLET BY MOUTH EVERY DAY 07/25/23   Lewis, Leslie S, PA-C  Pitavastatin  Calcium (LIVALO ) 4 MG TABS Take 4 mg by mouth every evening.    [provider]  prasugrel  (EFFIENT ) 10 MG TABS tablet Take 10 mg by mouth in the morning. 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  Tobacco Use   Smoking status: Every Day    Current packs/day: 0.50    Average packs/day: 0.5 packs/day for 25.0 years (12.5 ttl pk-yrs)    Types: Cigarettes   Smokeless tobacco: Never  Vaping Use   Vaping status: Never Used  Substance and Sexual Activity   Alcohol use: Yes    Comment: occ   Drug use: Never   Sexual activity: Yes  Other Topics Concern   Not on file  Social History Narrative   Not on file   Social Drivers of Health   Financial Resource Strain: Low Risk  (06/22/2020)   Overall Financial Resource Strain (CARDIA)    Difficulty of Paying Living Expenses: Not hard at all  Food Insecurity: No Food Insecurity (11/11/2023)   Hunger Vital Sign    Worried About Running Out of Food  in the Last Year: Never true    Ran Out of Food in the Last Year: Never true  Transportation Needs: No Transportation Needs (11/11/2023)   PRAPARE - Administrator, Civil Service (Medical): No    Lack of Transportation (Non-Medical): No  Physical Activity: Inactive (06/22/2020)   Exercise Vital Sign    Days of Exercise per Week: 0 days    Minutes of Exercise per Session: 0 min  Stress: No Stress Concern Present (06/22/2020)   [employer] of Occupational Health - Occupational Stress Questionnaire    Feeling of Stress : Not at all  Social Connections: Socially Isolated (11/11/2023)   Social Connection and Isolation Panel    Frequency of Communication with Friends and Family: More than three times a week    Frequency of Social Gatherings with Friends and Family: More than three times a week    Attends Religious Services: Never    Database administrator or Organizations: No    Attends Banker Meetings: Never    Marital Status: Divorced  Catering manager Violence: Not At Risk (11/11/2023)   Humiliation,  Afraid, Rape, and Kick questionnaire    Fear of Current or Ex-Partner: No    Emotionally Abused: No    Physically Abused: No    Sexually Abused: No    Tobacco Use: High Risk (11/11/2023)   Patient History    Smoking Tobacco Use: Every Day    Smokeless Tobacco Use: Never    Passive Exposure: Not on file   Social History   Substance and Sexual Activity  Alcohol Use Yes   Comment: occ    Family History  Problem Relation Age of Onset   Colon polyps Father        thinks he may have had polyps, possibly in his 55s.    Heart attack Father    Dementia Mother    Hypotension Mother    Stroke Sister    Diabetes Maternal Grandmother    Stroke Maternal Grandfather    Cancer Paternal Grandmother    Congestive Heart Failure Paternal Grandfather    Kidney cancer Daughter    Colon cancer Neg Hx    Pancreatic cancer Neg Hx    Liver disease Neg Hx     ROS  Objective:  Physical Exam: - Left knee, no effusion. Range of motion is 0-125. Moderate crepitus on range of motion. Tender medially with some slight lateral tenderness also and no instability noted.  IMAGING:  - Radiographs AP and lateral views of both knees taken today demonstrate the prosthesis on the right knee is in excellent position with no periprosthetic abnormalities. The left knee shows bone-on-bone changes medially with varus deformity.  Assessment/Plan:  End stage arthritis, left knee   The patient history, physical examination, clinical judgment of the provider and imaging studies are consistent with end stage degenerative joint disease of the left knee and total knee arthroplasty is deemed medically necessary. The treatment options including medical management, injection therapy arthroscopy and arthroplasty were discussed at length. The risks and benefits of total knee arthroplasty were presented and reviewed. The risks due to aseptic loosening, infection, stiffness, patella tracking problems, thromboembolic  complications and other imponderables were discussed. The patient acknowledged the explanation, agreed to proceed with the plan and consent was signed. Patient is being admitted for inpatient treatment for surgery, pain control, PT, OT, prophylactic antibiotics, VTE prophylaxis, progressive ambulation and ADLs and discharge planning. The patient is planning to be discharged home.  Patient's anticipated LOS is less than 2 midnights, meeting these requirements: - Younger than 69 - Lives within 1 hour of care - Has a competent adult at home to recover with post-op recover - NO history of  - Chronic pain requiring opiods  - Diabetes  - Heart failure  - Stroke  - DVT/VTE  - Cardiac arrhythmia  - Respiratory Failure/COPD  - Renal failure  - Anemia  - Advanced Liver disease  Therapy Plans: [facility] in [place], [place] Disposition: Home with girlfriend who is a nurse Planned DVT Prophylaxis: Prasugrel  + ASA 81mg  BID DME Needed: None PCP: Amy Stanfield (clearance in 9/29 OV note) Cardiologist: Arley Pinal, MD (clearance received) TXA: IV Allergies: NKDA Anesthesia Concerns: None BMI: 29.3 Last HgbA1c: Not diabetic, last A1C 5.5% [facility] [address], [place] Pain Regimen: Oxycodone   Other: -Stop Prasugrel  and aspirin  5 days before surgery per cardiology clearance   - Patient was instructed on what medications to stop prior to surgery. - Follow-up visit in 2 weeks with Dr. Melodi - Begin physical therapy following surgery - Pre-operative lab work as pre-surgical testing - Prescriptions will be provided in hospital at time of discharge  Corean Sender, PA-C Orthopedic Surgery [facility] [place]

## 2024-05-14 ENCOUNTER — Encounter: Payer: Self-pay | Admitting: *Deleted

## 2024-05-15 ENCOUNTER — Ambulatory Visit: Admitting: Cardiology

## 2024-05-20 NOTE — Progress Notes (Signed)
 COVID Vaccine received:  [x]  No []  Yes Date of any COVID positive Test in last 90 days: no PCP - Greig Margo FNP Cardiologist - Dr. Arley Pinal- In [place] Notes requested from Cardio office 05/22/24. Chest x-ray -  EKG -   Stress Test -  ECHO - 01/14/20 Epic Cardiac Cath -   Bowel Prep - [x]  No  []   Yes ______  Pacemaker / ICD device [x]  No []  Yes   Spinal Cord Stimulator:[x]  No []  Yes       History of Sleep Apnea? [x]  No []  Yes   CPAP used?- [x]  No []  Yes    Does the patient monitor blood sugar?          [x]  No []  Yes  []  N/A  Patient has: [x]  NO Hx DM   []  Pre-DM                 []  DM1  []   DM2 Does patient have a [employer] or Dexacom? []  No []  Yes   Fasting Blood Sugar Ranges-  Checks Blood Sugar _____ times a day  GLP1 agonist / usual dose - no GLP1 instructions:  SGLT-2 inhibitors / usual dose - no SGLT-2 instructions:   Blood Thinner / Instructions:Effient (Prasugrel ) Instructed to hold 5 days. Last dose will be 05/26/24 AM Aspirin  Instructions:N/A  Comments:   Activity level: Patient is able  to climb a flight of stairs without difficulty; [x]  No CP  [x]  No SOB,  Patient can perform ADLs without assistance.   Anesthesia review: CAD, MI-stents 2003, CHF, HTN, Abnl. EKG  Patient denies shortness of breath, fever, cough and chest pain at PAT appointment.  Patient verbalized understanding and agreement to the Pre-Surgical Instructions that were given to them at this PAT appointment. Patient was also educated of the need to review these PAT instructions again prior to his/her surgery.I reviewed the appropriate phone numbers to call if they have any and questions or concerns.

## 2024-05-20 NOTE — Patient Instructions (Signed)
 SURGICAL WAITING ROOM VISITATION  Patients having surgery or a procedure may have no more than 2 support people in the waiting area - these visitors may rotate.    Children under the age of 47 must have an adult with them who is not the patient.  Visitors with respiratory illnesses are discouraged from visiting and should remain at home.  If the patient needs to stay at the hospital during part of their recovery, the visitor guidelines for inpatient rooms apply. Pre-op nurse will coordinate an appropriate time for 1 support person to accompany patient in pre-op.  This support person may not rotate.    Please refer to the [facility] website for the visitor guidelines for Inpatients (after your surgery is over and you are in a regular room).       Your procedure is scheduled on: 06/01/24   Report to [facility] Main Entrance    Report to admitting at 5:50 AM   Call this number if you have problems the morning of surgery [phone]   Do not eat food :After Midnight.   After Midnight you may have the following liquids until 5:20 AM DAY OF SURGERY  Water  Non-Citrus Juices (without pulp, NO RED-Apple, White grape, White cranberry) Black Coffee (NO MILK/CREAM OR CREAMERS, sugar ok)  Clear Tea (NO MILK/CREAM OR CREAMERS, sugar ok) regular and decaf                             Plain Jell-O (NO RED)                                           Fruit ices (not with fruit pulp, NO RED)                                     Popsicles (NO RED)                                                               Sports drinks like Gatorade (NO RED)                   The day of surgery:  Drink ONE (1) Pre-Surgery Clear Ensure at 5:20 AM the morning of surgery. Drink in one sitting. Do not sip.  This drink was given to you during your hospital  pre-op appointment visit. Nothing else to drink after completing the  Pre-Surgery Clear Ensure.      Oral Hygiene is also important to reduce your  risk of infection.                                    Remember - BRUSH YOUR TEETH THE MORNING OF SURGERY WITH YOUR REGULAR TOOTHPASTE  DENTURES WILL BE REMOVED PRIOR TO SURGERY PLEASE DO NOT APPLY Poly grip OR ADHESIVES!!!   Do NOT smoke after Midnight   Stop all vitamins and herbal supplements 7 days before surgery.   Take these medicines the morning of surgery  with A SIP OF WATER : Tylenol , carvedilol , entresto , escitalopram (lexapro ), gabapentin , pantoprazole              You may not have any metal on your body including hair pins, jewelry, and body piercing             Do not wear make-up, lotions, powders, perfumes/cologne, or deodorant              Men may shave face and neck.   Do not bring valuables to the hospital. [facility] IS NOT             RESPONSIBLE   FOR VALUABLES.   Contacts, glasses, dentures or bridgework may not be worn into surgery.   Bring small overnight bag day of surgery.   DO NOT BRING YOUR HOME MEDICATIONS TO THE HOSPITAL. PHARMACY WILL DISPENSE MEDICATIONS LISTED ON YOUR MEDICATION LIST TO YOU DURING YOUR ADMISSION IN THE HOSPITAL!    Patients discharged on the day of surgery will not be allowed to drive home.  Someone NEEDS to stay with you for the first 24 hours after anesthesia.   Special Instructions: Bring a copy of your healthcare power of attorney and living will documents the day of surgery if you haven't scanned them before.              Please read over the following fact sheets you were given: IF YOU HAVE QUESTIONS ABOUT YOUR PRE-OP INSTRUCTIONS PLEASE [phone] [place]   If you received a COVID test during your pre-op visit  it is requested that you wear a mask when out in public, stay away from anyone that may not be feeling well and notify your surgeon if you develop symptoms. If you test positive for Covid or have been in contact with anyone that has tested positive in the last 10 days please notify you surgeon.       Pre-operative 4 CHG Bath Instructions  DYNA-Hex 4 Chlorhexidine  Gluconate 4% Solution Antiseptic 4 fl. oz   You can play a key role in reducing the risk of infection after surgery. Your skin needs to be as free of germs as possible. You can reduce the number of germs on your skin by washing with CHG (chlorhexidine  gluconate) soap before surgery. CHG is an antiseptic soap that kills germs and continues to kill germs even after washing.   DO NOT use if you have an allergy to chlorhexidine /CHG or antibacterial soaps. If your skin becomes reddened or irritated, stop using the CHG and notify one of our RNs at   Please shower with the CHG soap starting 4 days before surgery using the following schedule:     Please keep in mind the following:  DO NOT shave, including legs and underarms, starting the day of your first shower.   You may shave your face at any point before/day of surgery.  Place clean sheets on your bed the day you start using CHG soap. Use a clean washcloth (not used since being washed) for each shower. DO NOT sleep with pets once you start using the CHG.  CHG Shower Instructions:  If you choose to wash your hair and private area, wash first with your normal shampoo/soap.  After you use shampoo/soap, rinse your hair and body thoroughly to remove shampoo/soap residue.  Turn the water  OFF and apply about 3 tablespoons (45 ml) of CHG soap to a CLEAN washcloth.  Apply CHG soap ONLY FROM YOUR NECK DOWN TO YOUR TOES (washing for 3-5 minutes)  DO NOT use CHG soap on face, private areas, open wounds, or sores.  Pay special attention to the area where your surgery is being performed.  If you are having back surgery, having someone wash your back for you may be helpful. Wait 2 minutes after CHG soap is applied, then you may rinse off the CHG soap.  Pat dry with a clean towel  Put on clean clothes/pajamas   If you choose to wear lotion, please use ONLY the CHG-compatible lotions on the  back of this paper.     Additional instructions for the day of surgery: DO NOT APPLY any lotions, deodorants, cologne, or perfumes.   Put on clean/comfortable clothes.  Brush your teeth.  Ask your nurse before applying any prescription medications to the skin.   CHG Compatible Lotions   Aveeno Moisturizing lotion  Cetaphil Moisturizing Cream  Cetaphil Moisturizing Lotion  Clairol Herbal Essence Moisturizing Lotion, Dry Skin  Clairol Herbal Essence Moisturizing Lotion, Extra Dry Skin  Clairol Herbal Essence Moisturizing Lotion, Normal Skin  Curel Age Defying Therapeutic Moisturizing Lotion with Alpha Hydroxy  Curel Extreme Care Body Lotion  Curel Soothing Hands Moisturizing Hand Lotion  Curel Therapeutic Moisturizing Cream, Fragrance-Free  Curel Therapeutic Moisturizing Lotion, Fragrance-Free  Curel Therapeutic Moisturizing Lotion, Original Formula  Eucerin Daily Replenishing Lotion  Eucerin Dry Skin Therapy Plus Alpha Hydroxy Crme  Eucerin Dry Skin Therapy Plus Alpha Hydroxy Lotion  Eucerin Original Crme  Eucerin Original Lotion  Eucerin Plus Crme Eucerin Plus Lotion  Eucerin TriLipid Replenishing Lotion  Keri Anti-Bacterial Hand Lotion  Keri Deep Conditioning Original Lotion Dry Skin Formula Softly Scented  Keri Deep Conditioning Original Lotion, Fragrance Free Sensitive Skin Formula  Keri Lotion Fast Absorbing Fragrance Free Sensitive Skin Formula  Keri Lotion Fast Absorbing Softly Scented Dry Skin Formula  Keri Original Lotion  Keri Skin Renewal Lotion Keri Silky Smooth Lotion  Keri Silky Smooth Sensitive Skin Lotion  Nivea Body Creamy Conditioning Oil  Nivea Body Extra Enriched Lotion  Nivea Body Original Lotion  Nivea Body Sheer Moisturizing Lotion Nivea Crme  Nivea Skin Firming Lotion  NutraDerm 30 Skin Lotion  NutraDerm Skin Lotion  NutraDerm Therapeutic Skin Cream  NutraDerm Therapeutic Skin Lotion  ProShield Protective Hand Cream  Incentive  Spirometer ( An incentive spirometer is a tool that can help keep your lungs clear and active. This tool measures how well you are filling your lungs with each breath. Taking long deep breaths may help reverse or decrease the chance of developing breathing (pulmonary) problems (especially infection) following: A long period of time when you are unable to move or be active. BEFORE THE PROCEDURE  If the spirometer includes an indicator to show your best effort, your nurse or respiratory therapist will set it to a desired goal. If possible, sit up straight or lean slightly forward. Try not to slouch. Hold the incentive spirometer in an upright position. INSTRUCTIONS FOR USE  Sit on the edge of your bed if possible, or sit up as far as you can in bed or on a chair. Hold the incentive spirometer in an upright position. Breathe out normally. Place the mouthpiece in your mouth and seal your lips tightly around it. Breathe in slowly and as deeply as possible, raising the piston or the ball toward the top of the column. Hold your breath for 3-5 seconds or for as long as possible. Allow the piston or ball to fall to the bottom of the column. Remove the mouthpiece from your mouth and  breathe out normally. Rest for a few seconds and repeat Steps 1 through 7 at least 10 times every 1-2 hours when you are awake. Take your time and take a few normal breaths between deep breaths. The spirometer may include an indicator to show your best effort. Use the indicator as a goal to work toward during each repetition. After each set of 10 deep breaths, practice coughing to be sure your lungs are clear. If you have an incision (the cut made at the time of surgery), support your incision when coughing by placing a pillow or rolled up towels firmly against it. Once you are able to get out of bed, walk around indoors and cough well. You may stop using the incentive spirometer when instructed by your caregiver.  RISKS AND  COMPLICATIONS Take your time so you do not get dizzy or light-headed. If you are in pain, you may need to take or ask for pain medication before doing incentive spirometry. It is harder to take a deep breath if you are having pain. AFTER USE Rest and breathe slowly and easily. It can be helpful to keep track of a log of your progress. Your caregiver can provide you with a simple table to help with this. If you are using the spirometer at home, follow these instructions: SEEK MEDICAL CARE IF:  You are having difficultly using the spirometer. You have trouble using the spirometer as often as instructed. Your pain medication is not giving enough relief while using the spirometer. You develop fever of 100.5 F (38.1 C) or higher. SEEK IMMEDIATE MEDICAL CARE IF:  You cough up bloody sputum that had not been present before. You develop fever of 102 F (38.9 C) or greater. You develop worsening pain at or near the incision site. MAKE SURE YOU:  Understand these instructions. Will watch your condition. Will get help right away if you are not doing well or get worse. Document Released: 11/26/2006 Document Revised: 10/08/2011 Document Reviewed: 01/27/2007 [facility] Patient Information 2014 [place], [place].

## 2024-05-22 ENCOUNTER — Encounter (HOSPITAL_COMMUNITY)
Admission: RE | Admit: 2024-05-22 | Discharge: 2024-05-22 | Disposition: A | Discharge status: Home / Self Care | Source: Ambulatory Visit | Attending: Orthopedic Surgery | Admitting: Orthopedic Surgery

## 2024-05-22 ENCOUNTER — Other Ambulatory Visit: Payer: Self-pay

## 2024-05-22 ENCOUNTER — Encounter (HOSPITAL_COMMUNITY): Payer: Self-pay

## 2024-05-22 VITALS — BP 143/96 | HR 58 | Temp 97.6°F | Resp 16 | Ht 70.0 in | Wt 197.0 lb

## 2024-05-22 DIAGNOSIS — Z8572 Personal history of non-Hodgkin lymphomas: Secondary | ICD-10-CM | POA: Insufficient documentation

## 2024-05-22 DIAGNOSIS — R0602 Shortness of breath: Secondary | ICD-10-CM | POA: Diagnosis not present

## 2024-05-22 DIAGNOSIS — I110 Hypertensive heart disease with heart failure: Secondary | ICD-10-CM | POA: Diagnosis not present

## 2024-05-22 DIAGNOSIS — I509 Heart failure, unspecified: Secondary | ICD-10-CM | POA: Insufficient documentation

## 2024-05-22 DIAGNOSIS — I2510 Atherosclerotic heart disease of native coronary artery without angina pectoris: Secondary | ICD-10-CM | POA: Insufficient documentation

## 2024-05-22 DIAGNOSIS — Z9221 Personal history of antineoplastic chemotherapy: Secondary | ICD-10-CM | POA: Diagnosis not present

## 2024-05-22 DIAGNOSIS — R001 Bradycardia, unspecified: Secondary | ICD-10-CM | POA: Diagnosis not present

## 2024-05-22 DIAGNOSIS — M1712 Unilateral primary osteoarthritis, left knee: Secondary | ICD-10-CM | POA: Diagnosis present

## 2024-05-22 DIAGNOSIS — I10 Essential (primary) hypertension: Secondary | ICD-10-CM

## 2024-05-22 DIAGNOSIS — I255 Ischemic cardiomyopathy: Secondary | ICD-10-CM | POA: Diagnosis not present

## 2024-05-22 DIAGNOSIS — I252 Old myocardial infarction: Secondary | ICD-10-CM | POA: Diagnosis not present

## 2024-05-22 DIAGNOSIS — R42 Dizziness and giddiness: Secondary | ICD-10-CM | POA: Diagnosis not present

## 2024-05-22 DIAGNOSIS — Z955 Presence of coronary angioplasty implant and graft: Secondary | ICD-10-CM | POA: Insufficient documentation

## 2024-05-22 DIAGNOSIS — Z01818 Encounter for other preprocedural examination: Secondary | ICD-10-CM

## 2024-05-22 HISTORY — DX: Heart failure, unspecified: I50.9

## 2024-05-22 LAB — CBC
HCT: 40.1 % (ref 39.0–52.0)
Hemoglobin: 12.9 g/dL — ABNORMAL LOW (ref 13.0–17.0)
MCH: 30.3 pg (ref 26.0–34.0)
MCHC: 32.2 g/dL (ref 30.0–36.0)
MCV: 94.1 fL (ref 80.0–100.0)
Platelets: 209 K/uL (ref 150–400)
RBC: 4.26 MIL/uL (ref 4.22–5.81)
RDW: 12.5 % (ref 11.5–15.5)
WBC: 7.8 K/uL (ref 4.0–10.5)
nRBC: 0 % (ref 0.0–0.2)

## 2024-05-22 LAB — BASIC METABOLIC PANEL WITH GFR
Anion gap: 10 (ref 5–15)
BUN: 12 mg/dL (ref 8–23)
CO2: 24 mmol/L (ref 22–32)
Calcium: 9.5 mg/dL (ref 8.9–10.3)
Chloride: 102 mmol/L (ref 98–111)
Creatinine, Ser: 0.88 mg/dL (ref 0.61–1.24)
GFR, Estimated: >60 mL/min (ref >60)
Glucose, Bld: 99 mg/dL (ref 70–99)
Potassium: 4 mmol/L (ref 3.5–5.1)
Sodium: 137 mmol/L (ref 135–145)

## 2024-05-22 LAB — SURGICAL PCR SCREEN
MRSA, PCR: NEGATIVE
Staphylococcus aureus: NEGATIVE

## 2024-05-25 ENCOUNTER — Encounter (HOSPITAL_COMMUNITY): Payer: Self-pay

## 2024-05-25 NOTE — Progress Notes (Signed)
 Case: [id number] Date/Time: 06/01/24 0805   Procedure: ARTHROPLASTY, KNEE, TOTAL (Left: Knee)   Anesthesia type: Choice   Pre-op diagnosis: Left knee osteoarthritis   Location: [facility] ROOM 10 / [facility]   Surgeons: Melodi Lerner, MD       DISCUSSION: Patrick Rubio is a 63 yo male with PMH of every day smoking, HTN, CAD s/p PCI to RCA (2003, 2021) and circumflex (2021), hx of MI (~2003), CHF, lymphocytic lymphoma s/p chemo (2021), arthritis, lumbar fusion.  Patient underwent R TKA on 11/11/23 which was uncomplicated.   Patient follows with cardiologist Dr. Cleotilde in [facility] for history of HTN, HLD, PVCs, ischemic cardiomyopathy, CAD - cardiac catheterization on 10/01/2019 found to have 100% occlusion of the RCA. -angioplasty and stenting of the circumflex was done.  Nuclear stress 02/18/2023 was low risk. Last seen by Dr. Cleotilde on 04/20/24 for annual visit/pre op visit. Noted to be stable, without symptoms. No changes to management.  He did subsequently cleared patient for TKA, copy of clearance scanned in media (on 04/21/24) with instructions:  This is to inform you that the above mentioned patient was evaluated by me and has been cleared from a cardiac standpoint fro left total knee arthoplasty. Patient can stop Prasugrel  HCL and aspirin  5 days prior to procedure then resume afterwards  Seen by PCP on 04/27/24 for annual exam. Stable at that visit: Patient is in need of clearance for knee surgery, he has been evaluated and cleared by his cardiologist note in chart with recommendations hold Prasugrel  HCL and Aspirin  5 days prior to procedure then resume afterwards. Patient in need of left knee surgery by dr. melodi of [place], he has been seen and cleared by cardiology, paperwork filled out and faxed for patient he is medically cleared  Follows with oncologist Dr. 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  PET scan 05/2020 did not show  any evidence of lymphoma.  Last seen by Dr. Rogers on 03/05/24, doing well at that time. Labs normal. Advised f/u in 1 year.   Patient reports last dose of prasugrel  05/26/24  VS: BP (!) 143/96   Pulse (!) 58   Temp 36.4 C (Oral)   Resp 16   Ht 5' 10 (1.778 m)   Wt 89.4 kg   SpO2 97%   BMI 28.27 kg/m   PROVIDERS: Paola Greig CROME, FNP   LABS: Labs reviewed: Acceptable for surgery. (all labs ordered are listed, but only abnormal results are displayed)  Labs Reviewed  CBC - Abnormal; Notable for the following components:      Result Value   Hemoglobin 12.9 (*)    All other components within normal limits  SURGICAL PCR SCREEN  BASIC METABOLIC PANEL WITH GFR     IMAGES:   EKG 05/22/24:  Sinus bradycardia with occasional Premature ventricular complexes Non-specific intra-ventricular conduction block Minimal voltage criteria for LVH, may be normal variant ( R in aVL ) Abnormal ECG When compared with ECG of 18-Nov-2021 17:19, PREVIOUS ECG IS PRESENT Since last tracing rate slower305  Nuclear stress test 02/18/2023 (copy scanned into media): Impressions: The perfusion study is equivocal.  No perfusion defect seen on imaging. The patient experienced SOB, lightheaded. Positive stress-induced PVCs. Physiologic blood pressure response. No infusion induced EKG changes. SPECT nuclear imaging demonstrates no evidence of ischemia. No evidence of infarct. Paradoxical septal motion with dilated LV. Calculated LVEF: 40 to 45%. Recommendations/conclusions: Low risk preoperative study.  Paradoxical septal  motion with dilated LV.  Summed rest = 2, summed stress = 1. TID index 1.08.  No significant ischemic perfusion defect noted.   TTE 09/13/2022 (copy scanned into media): Conclusions: Normal left ventricular systolic function.  LVEF: 50 to 55%. Normal right ventricular function.  TAPSE normal. Mild to moderate left ventricular hypertrophy. Stage I diastolic  dysfunction. Cardiac chamber imaging demonstrates moderate left ventricular enlargement, mild right ventricular enlargement, and mild right atrial enlargement. Valvular and Doppler imaging demonstrates trace to mild aortic insufficiency, mild mitral regurgitation, mild pulmonic insufficiency, and mild tricuspid regurgitation with Doppler evidence of mild pulmonary hypertension. Mildly elevated estimated PA systolic pressure. No pericardial effusion. No other significant findings.  Past Medical History:  Diagnosis Date   Arthritis    CAD (coronary artery disease)    CHF (congestive heart failure) (HCC)    Complication of anesthesia    Back surgery in 2003 - woke up after and had to have a breathing tx- 6 hour surgery   History of chemotherapy    2022   History of kidney stones    HTN (hypertension)    Hypercholesterolemia    MI (myocardial infarction) (HCC) 2003   Small lymphocytic lymphoma (HCC)    Stomach cancer (HCC)     Past Surgical History:  Procedure Laterality Date   AXILLARY LYMPH NODE BIOPSY Left 04/27/2019   Procedure: AXILLARY LYMPH NODE BIOPSY;  Surgeon: Mavis Anes, MD;  [facility];  Service: General;  Laterality: Left;   BIOPSY  11/26/2018   Procedure: BIOPSY;  Surgeon: Shaaron Lamar HERO, MD;  [facility];  Service: Endoscopy;;   BIOPSY  03/08/2022   Procedure: BIOPSY;  Surgeon: Shaaron Lamar HERO, MD;  [facility];  Service: Endoscopy;;   CHOLECYSTECTOMY N/A 05/18/2019   Procedure: LAPAROSCOPIC CHOLECYSTECTOMY;  Surgeon: Mavis Anes, MD;  [facility];  Service: General;  Laterality: N/A;   COLONOSCOPY WITH PROPOFOL  N/A 03/08/2022   Procedure: COLONOSCOPY WITH PROPOFOL ;  Surgeon: Shaaron Lamar HERO, MD;  [facility];  Service: Endoscopy;  Laterality: N/A;  8:45am   CORONARY ANGIOPLASTY WITH STENT PLACEMENT     2003   ESOPHAGOGASTRODUODENOSCOPY N/A 11/26/2018   mild erosive reflux esophagitis, gastric erythema. Negative  H.pylori    ESOPHAGOGASTRODUODENOSCOPY (EGD) WITH PROPOFOL  N/A 03/08/2022   Procedure: ESOPHAGOGASTRODUODENOSCOPY (EGD) WITH PROPOFOL ;  Surgeon: Shaaron Lamar HERO, MD;  [facility];  Service: Endoscopy;  Laterality: N/A;   INGUINAL HERNIA REPAIR Right 04/27/2019   Procedure: HERNIA REPAIR INGUINAL ADULT;  Surgeon: Mavis Anes, MD;  [facility];  Service: General;  Laterality: Right;   KNEE SURGERY Right    lumbar fusion with cage  07/2017   NASAL SINUS SURGERY     TONSILLECTOMY AND ADENOIDECTOMY     TOTAL KNEE ARTHROPLASTY Right 11/11/2023   Procedure: ARTHROPLASTY, KNEE, TOTAL;  Surgeon: Melodi Lerner, MD;  [facility];  Service: Orthopedics;  Laterality: Right;    MEDICATIONS:  acetaminophen  (TYLENOL ) 650 MG CR tablet   amoxicillin (AMOXIL) 500 MG tablet   baclofen  (LIORESAL ) 10 MG tablet   carvedilol  (COREG ) 6.25 MG tablet   celecoxib (CELEBREX) 200 MG capsule   Cholecalciferol (VITAMIN D3 PO)   ENTRESTO  49-51 MG   escitalopram  (LEXAPRO ) 20 MG tablet   gabapentin  (NEURONTIN ) 300 MG capsule   nitroGLYCERIN  (NITROSTAT ) 0.4 MG SL tablet   pantoprazole  (PROTONIX ) 40 MG tablet   Pitavastatin  Calcium (LIVALO ) 4 MG TABS   prasugrel  (EFFIENT ) 10 MG TABS tablet  No current facility-administered medications for this encounter.   Burnard CHRISTELLA Odis DEVONNA [facility] Short Stay/Anesthesiology [facility] Phone [phone] 05/25/2024 1:59 PM

## 2024-05-25 NOTE — Anesthesia Preprocedure Evaluation (Addendum)
 Anesthesia Evaluation  Patient identified by MRN, date of birth, ID band Patient awake    Reviewed: Allergy & Precautions, NPO status , Patient's Chart, lab work & pertinent test results, reviewed documented beta blocker date and time   History of Anesthesia Complications Negative for: history of anesthetic complications  Airway Mallampati: II  TM Distance: >3 FB Neck ROM: Full    Dental no notable dental hx.    Pulmonary Current Smoker and Patient abstained from smoking.   Pulmonary exam normal        Cardiovascular hypertension, Pt. on home beta blockers and Pt. on medications + CAD, + Past MI (2003, on prasugrel  (last dose 05/26/24)) and +CHF  Normal cardiovascular exam     Neuro/Psych negative neurological ROS     GI/Hepatic Neg liver ROS,GERD  Medicated,,  Endo/Other  negative endocrine ROS    Renal/GU negative Renal ROS     Musculoskeletal  (+) Arthritis ,    Abdominal   Peds  Hematology  (+) Blood dyscrasia (Hgb 12.9), anemia   Anesthesia Other Findings   Reproductive/Obstetrics                              Anesthesia Physical Anesthesia Plan  ASA: 3  Anesthesia Plan: General   Post-op Pain Management: Tylenol  PO (pre-op)* and Regional block*   Induction:   PONV Risk Score and Plan: 2 and Treatment may vary due to age or medical condition, Ondansetron , Propofol  infusion, Dexamethasone , Midazolam  and TIVA  Airway Management Planned: LMA  Additional Equipment: None  Intra-op Plan:   Post-operative Plan: Extubation in OR  Informed Consent: I have reviewed the patients History and Physical, chart, labs and discussed the procedure including the risks, benefits and alternatives for the proposed anesthesia with the patient or authorized representative who has indicated his/her understanding and acceptance.     Dental advisory given  Plan Discussed with:  CRNA  Anesthesia Plan Comments: (See PAT note from 10/24)         Anesthesia Quick Evaluation

## 2024-06-01 ENCOUNTER — Other Ambulatory Visit: Payer: Self-pay

## 2024-06-01 ENCOUNTER — Ambulatory Visit (HOSPITAL_BASED_OUTPATIENT_CLINIC_OR_DEPARTMENT_OTHER): Admitting: Anesthesiology

## 2024-06-01 ENCOUNTER — Encounter (HOSPITAL_COMMUNITY): Payer: Self-pay | Admitting: Orthopedic Surgery

## 2024-06-01 ENCOUNTER — Observation Stay (HOSPITAL_COMMUNITY)
Admission: RE | Admit: 2024-06-01 | Discharge: 2024-06-02 | Disposition: A | Discharge status: Home / Self Care | Attending: Orthopedic Surgery | Admitting: Orthopedic Surgery

## 2024-06-01 ENCOUNTER — Encounter (HOSPITAL_COMMUNITY): Admission: RE | Disposition: A | Payer: Self-pay | Source: Home / Self Care | Attending: Orthopedic Surgery

## 2024-06-01 ENCOUNTER — Ambulatory Visit (HOSPITAL_COMMUNITY): Payer: Self-pay | Admitting: Medical

## 2024-06-01 DIAGNOSIS — F1090 Alcohol use, unspecified, uncomplicated: Secondary | ICD-10-CM | POA: Diagnosis not present

## 2024-06-01 DIAGNOSIS — Z7982 Long term (current) use of aspirin: Secondary | ICD-10-CM | POA: Insufficient documentation

## 2024-06-01 DIAGNOSIS — I2510 Atherosclerotic heart disease of native coronary artery without angina pectoris: Secondary | ICD-10-CM | POA: Diagnosis not present

## 2024-06-01 DIAGNOSIS — I110 Hypertensive heart disease with heart failure: Secondary | ICD-10-CM | POA: Insufficient documentation

## 2024-06-01 DIAGNOSIS — I509 Heart failure, unspecified: Secondary | ICD-10-CM | POA: Diagnosis not present

## 2024-06-01 DIAGNOSIS — F17210 Nicotine dependence, cigarettes, uncomplicated: Secondary | ICD-10-CM | POA: Diagnosis not present

## 2024-06-01 DIAGNOSIS — M179 Osteoarthritis of knee, unspecified: Principal | ICD-10-CM | POA: Diagnosis present

## 2024-06-01 DIAGNOSIS — M1712 Unilateral primary osteoarthritis, left knee: Principal | ICD-10-CM | POA: Diagnosis present

## 2024-06-01 DIAGNOSIS — M1711 Unilateral primary osteoarthritis, right knee: Principal | ICD-10-CM

## 2024-06-01 HISTORY — PX: TOTAL KNEE ARTHROPLASTY: SHX125

## 2024-06-01 SURGERY — ARTHROPLASTY, KNEE, TOTAL
Anesthesia: General | Site: Knee | Laterality: Left

## 2024-06-01 MED ORDER — POLYETHYLENE GLYCOL 3350 17 G PO PACK: 17.0000 g | PACK | Freq: Every day | ORAL | Status: DC | PRN

## 2024-06-01 MED ORDER — NITROGLYCERIN 0.4 MG SL SUBL: 0.4000 mg | SUBLINGUAL_TABLET | SUBLINGUAL | Status: DC | PRN

## 2024-06-01 MED ORDER — PHENYLEPHRINE 80 MCG/ML (10ML) SYRINGE FOR IV PUSH (FOR BLOOD PRESSURE SUPPORT)
PREFILLED_SYRINGE | INTRAVENOUS | Status: AC
  Filled 2024-06-01: qty 10

## 2024-06-01 MED ORDER — ESCITALOPRAM OXALATE 20 MG PO TABS
20.0000 mg | ORAL_TABLET | Freq: Every day | ORAL | Status: DC
  Administered 2024-06-02: 20 mg via ORAL
  Filled 2024-06-01: qty 1

## 2024-06-01 MED ORDER — PROPOFOL 10 MG/ML IV BOLUS
INTRAVENOUS | Status: AC
  Filled 2024-06-01: qty 20

## 2024-06-01 MED ORDER — CEFAZOLIN SODIUM-DEXTROSE 2-4 GM/100ML-% IV SOLN
2.0000 g | Freq: Four times a day (QID) | INTRAVENOUS | Status: AC
  Administered 2024-06-01 (×2): 2 g via INTRAVENOUS
  Filled 2024-06-01 (×2): qty 100

## 2024-06-01 MED ORDER — BISACODYL 10 MG RE SUPP: 10.0000 mg | Freq: Every day | RECTAL | Status: DC | PRN

## 2024-06-01 MED ORDER — FENTANYL CITRATE (PF) 100 MCG/2ML IJ SOLN
INTRAMUSCULAR | Status: AC
  Filled 2024-06-01: qty 2

## 2024-06-01 MED ORDER — POVIDONE-IODINE 10 % EX SWAB: 2.0000 | Freq: Once | CUTANEOUS | Status: DC

## 2024-06-01 MED ORDER — LACTATED RINGERS IV SOLN: INTRAVENOUS | Status: DC

## 2024-06-01 MED ORDER — SODIUM CHLORIDE (PF) 0.9 % IJ SOLN
INTRAMUSCULAR | Status: DC | PRN
  Administered 2024-06-01: 80 mL

## 2024-06-01 MED ORDER — PHENYLEPHRINE HCL (PRESSORS) 10 MG/ML IV SOLN
INTRAVENOUS | Status: AC
  Filled 2024-06-01: qty 1

## 2024-06-01 MED ORDER — OXYCODONE HCL 5 MG PO TABS
10.0000 mg | ORAL_TABLET | ORAL | Status: DC | PRN
  Administered 2024-06-01 – 2024-06-02 (×2): 10 mg via ORAL
  Filled 2024-06-01 (×2): qty 2

## 2024-06-01 MED ORDER — MENTHOL 3 MG MT LOZG: 1.0000 | LOZENGE | OROMUCOSAL | Status: DC | PRN

## 2024-06-01 MED ORDER — 0.9 % SODIUM CHLORIDE (POUR BTL) OPTIME
TOPICAL | Status: DC | PRN
  Administered 2024-06-01: 1000 mL

## 2024-06-01 MED ORDER — PROPOFOL 10 MG/ML IV BOLUS
INTRAVENOUS | Status: DC | PRN
  Administered 2024-06-01: 150 mg via INTRAVENOUS

## 2024-06-01 MED ORDER — DEXAMETHASONE SOD PHOSPHATE PF 10 MG/ML IJ SOLN: 8.0000 mg | Freq: Once | INTRAMUSCULAR | Status: DC

## 2024-06-01 MED ORDER — DIPHENHYDRAMINE HCL 12.5 MG/5ML PO ELIX: 12.5000 mg | ORAL_SOLUTION | ORAL | Status: DC | PRN

## 2024-06-01 MED ORDER — PRAVASTATIN SODIUM 20 MG PO TABS
80.0000 mg | ORAL_TABLET | Freq: Every day | ORAL | Status: DC
Start: 2024-06-02 — End: 2024-06-02

## 2024-06-01 MED ORDER — PROPOFOL 1000 MG/100ML IV EMUL
INTRAVENOUS | Status: AC
  Filled 2024-06-01: qty 100

## 2024-06-01 MED ORDER — FLEET ENEMA RE ENEM: 1.0000 | ENEMA | Freq: Once | RECTAL | Status: DC | PRN

## 2024-06-01 MED ORDER — FENTANYL CITRATE (PF) 50 MCG/ML IJ SOSY: 50.0000 ug | PREFILLED_SYRINGE | INTRAMUSCULAR | Status: DC

## 2024-06-01 MED ORDER — ACETAMINOPHEN 10 MG/ML IV SOLN
1000.0000 mg | Freq: Four times a day (QID) | INTRAVENOUS | Status: DC
  Filled 2024-06-01: qty 100

## 2024-06-01 MED ORDER — ACETAMINOPHEN 325 MG PO TABS: 325.0000 mg | ORAL_TABLET | Freq: Four times a day (QID) | ORAL | Status: DC | PRN

## 2024-06-01 MED ORDER — MIDAZOLAM HCL 2 MG/2ML IJ SOLN
INTRAMUSCULAR | Status: AC
  Filled 2024-06-01: qty 2

## 2024-06-01 MED ORDER — CHLORHEXIDINE GLUCONATE 0.12 % MT SOLN
15.0000 mL | Freq: Once | OROMUCOSAL | Status: AC
  Administered 2024-06-01: 15 mL via OROMUCOSAL

## 2024-06-01 MED ORDER — FENTANYL CITRATE (PF) 100 MCG/2ML IJ SOLN
INTRAMUSCULAR | Status: DC | PRN
  Administered 2024-06-01 (×4): 50 ug via INTRAVENOUS

## 2024-06-01 MED ORDER — ORAL CARE MOUTH RINSE: 15.0000 mL | Freq: Once | OROMUCOSAL | Status: AC

## 2024-06-01 MED ORDER — DEXAMETHASONE SOD PHOSPHATE PF 10 MG/ML IJ SOLN
INTRAMUSCULAR | Status: DC | PRN
  Administered 2024-06-01: 10 mg via INTRAVENOUS

## 2024-06-01 MED ORDER — DROPERIDOL 2.5 MG/ML IJ SOLN: 0.6250 mg | Freq: Once | INTRAMUSCULAR | Status: DC | PRN

## 2024-06-01 MED ORDER — METOCLOPRAMIDE HCL 5 MG/ML IJ SOLN: 5.0000 mg | Freq: Three times a day (TID) | INTRAMUSCULAR | Status: DC | PRN

## 2024-06-01 MED ORDER — CEFAZOLIN SODIUM-DEXTROSE 2-4 GM/100ML-% IV SOLN
2.0000 g | INTRAVENOUS | Status: AC
  Administered 2024-06-01: 2 g via INTRAVENOUS
  Filled 2024-06-01: qty 100

## 2024-06-01 MED ORDER — LACTATED RINGERS IV SOLN: INTRAVENOUS | Status: DC | PRN

## 2024-06-01 MED ORDER — ASPIRIN 81 MG PO CHEW
81.0000 mg | CHEWABLE_TABLET | Freq: Two times a day (BID) | ORAL | Status: DC
  Administered 2024-06-02: 81 mg via ORAL
  Filled 2024-06-01: qty 1

## 2024-06-01 MED ORDER — CLONIDINE HCL (ANALGESIA) 100 MCG/ML EP SOLN
EPIDURAL | Status: DC | PRN
  Administered 2024-06-01: 100 ug

## 2024-06-01 MED ORDER — PHENOL 1.4 % MT LIQD: 1.0000 | OROMUCOSAL | Status: DC | PRN

## 2024-06-01 MED ORDER — TRANEXAMIC ACID-NACL 1000-0.7 MG/100ML-% IV SOLN
INTRAVENOUS | Status: DC | PRN
  Administered 2024-06-01: 1000 mg via INTRAVENOUS

## 2024-06-01 MED ORDER — DEXAMETHASONE SOD PHOSPHATE PF 10 MG/ML IJ SOLN
10.0000 mg | Freq: Once | INTRAMUSCULAR | Status: AC
  Administered 2024-06-02: 10 mg via INTRAVENOUS

## 2024-06-01 MED ORDER — LIDOCAINE HCL (PF) 2 % IJ SOLN
INTRAMUSCULAR | Status: AC
  Filled 2024-06-01: qty 5

## 2024-06-01 MED ORDER — SACUBITRIL-VALSARTAN 49-51 MG PO TABS
1.0000 | ORAL_TABLET | Freq: Two times a day (BID) | ORAL | Status: DC
  Administered 2024-06-01 – 2024-06-02 (×2): 1 via ORAL
  Filled 2024-06-01 (×2): qty 1

## 2024-06-01 MED ORDER — PHENYLEPHRINE 80 MCG/ML (10ML) SYRINGE FOR IV PUSH (FOR BLOOD PRESSURE SUPPORT)
PREFILLED_SYRINGE | INTRAVENOUS | Status: DC | PRN
  Administered 2024-06-01: 80 ug via INTRAVENOUS

## 2024-06-01 MED ORDER — PROPOFOL 500 MG/50ML IV EMUL
INTRAVENOUS | Status: DC | PRN
  Administered 2024-06-01: 130 ug/kg/min via INTRAVENOUS

## 2024-06-01 MED ORDER — GABAPENTIN 300 MG PO CAPS
300.0000 mg | ORAL_CAPSULE | Freq: Two times a day (BID) | ORAL | Status: DC
  Administered 2024-06-01 – 2024-06-02 (×2): 300 mg via ORAL
  Filled 2024-06-01 (×2): qty 1

## 2024-06-01 MED ORDER — TRANEXAMIC ACID-NACL 1000-0.7 MG/100ML-% IV SOLN
1000.0000 mg | INTRAVENOUS | Status: DC
  Filled 2024-06-01: qty 100

## 2024-06-01 MED ORDER — DOCUSATE SODIUM 100 MG PO CAPS
100.0000 mg | ORAL_CAPSULE | Freq: Two times a day (BID) | ORAL | Status: DC
  Administered 2024-06-01 – 2024-06-02 (×2): 100 mg via ORAL
  Filled 2024-06-01 (×2): qty 1

## 2024-06-01 MED ORDER — METHOCARBAMOL 1000 MG/10ML IJ SOLN: 500.0000 mg | Freq: Four times a day (QID) | INTRAMUSCULAR | Status: DC | PRN

## 2024-06-01 MED ORDER — SODIUM CHLORIDE 0.9 % IR SOLN
Status: DC | PRN
  Administered 2024-06-01: 1000 mL

## 2024-06-01 MED ORDER — BUPIVACAINE LIPOSOME 1.3 % IJ SUSP
INTRAMUSCULAR | Status: AC
  Filled 2024-06-01: qty 20

## 2024-06-01 MED ORDER — HYDROMORPHONE HCL 1 MG/ML IJ SOLN
INTRAMUSCULAR | Status: AC
  Filled 2024-06-01: qty 1

## 2024-06-01 MED ORDER — SODIUM CHLORIDE 0.9 % IV SOLN: INTRAVENOUS | Status: DC

## 2024-06-01 MED ORDER — HYDROMORPHONE HCL 2 MG/ML IJ SOLN
INTRAMUSCULAR | Status: AC
  Filled 2024-06-01: qty 1

## 2024-06-01 MED ORDER — SODIUM CHLORIDE (PF) 0.9 % IJ SOLN
INTRAMUSCULAR | Status: AC
  Filled 2024-06-01: qty 50

## 2024-06-01 MED ORDER — STERILE WATER FOR IRRIGATION IR SOLN
Status: DC | PRN
  Administered 2024-06-01: 2000 mL

## 2024-06-01 MED ORDER — SODIUM CHLORIDE (PF) 0.9 % IJ SOLN
INTRAMUSCULAR | Status: AC
  Filled 2024-06-01: qty 10

## 2024-06-01 MED ORDER — METHOCARBAMOL 500 MG PO TABS
500.0000 mg | ORAL_TABLET | Freq: Four times a day (QID) | ORAL | Status: DC | PRN
  Administered 2024-06-01 – 2024-06-02 (×2): 500 mg via ORAL
  Filled 2024-06-01 (×2): qty 1

## 2024-06-01 MED ORDER — ONDANSETRON HCL 4 MG/2ML IJ SOLN: 4.0000 mg | Freq: Four times a day (QID) | INTRAMUSCULAR | Status: DC | PRN

## 2024-06-01 MED ORDER — ONDANSETRON HCL 4 MG PO TABS
4.0000 mg | ORAL_TABLET | Freq: Four times a day (QID) | ORAL | Status: DC | PRN
  Administered 2024-06-01: 4 mg via ORAL
  Filled 2024-06-01: qty 1

## 2024-06-01 MED ORDER — CARVEDILOL 6.25 MG PO TABS
6.2500 mg | ORAL_TABLET | Freq: Two times a day (BID) | ORAL | Status: DC
  Administered 2024-06-01 – 2024-06-02 (×2): 6.25 mg via ORAL
  Filled 2024-06-01 (×2): qty 1

## 2024-06-01 MED ORDER — PANTOPRAZOLE SODIUM 40 MG PO TBEC
40.0000 mg | DELAYED_RELEASE_TABLET | Freq: Every day | ORAL | Status: DC
  Administered 2024-06-02: 40 mg via ORAL
  Filled 2024-06-01: qty 1

## 2024-06-01 MED ORDER — BUPIVACAINE LIPOSOME 1.3 % IJ SUSP: 20.0000 mL | Freq: Once | INTRAMUSCULAR | Status: DC

## 2024-06-01 MED ORDER — ONDANSETRON HCL 4 MG/2ML IJ SOLN
INTRAMUSCULAR | Status: AC
  Filled 2024-06-01: qty 2

## 2024-06-01 MED ORDER — LIDOCAINE HCL (CARDIAC) PF 100 MG/5ML IV SOSY
PREFILLED_SYRINGE | INTRAVENOUS | Status: DC | PRN
  Administered 2024-06-01: 100 mg via INTRAVENOUS

## 2024-06-01 MED ORDER — ACETAMINOPHEN 500 MG PO TABS
1000.0000 mg | ORAL_TABLET | Freq: Four times a day (QID) | ORAL | Status: AC
  Administered 2024-06-01 – 2024-06-02 (×4): 1000 mg via ORAL
  Filled 2024-06-01 (×4): qty 2

## 2024-06-01 MED ORDER — ONDANSETRON HCL 4 MG/2ML IJ SOLN
INTRAMUSCULAR | Status: DC | PRN
  Administered 2024-06-01: 4 mg via INTRAVENOUS

## 2024-06-01 MED ORDER — HYDROMORPHONE HCL 1 MG/ML IJ SOLN: 0.5000 mg | INTRAMUSCULAR | Status: DC | PRN

## 2024-06-01 MED ORDER — MIDAZOLAM HCL 5 MG/5ML IJ SOLN
INTRAMUSCULAR | Status: DC | PRN
  Administered 2024-06-01: 2 mg via INTRAVENOUS

## 2024-06-01 MED ORDER — BUPIVACAINE-EPINEPHRINE (PF) 0.5% -1:200000 IJ SOLN
INTRAMUSCULAR | Status: DC | PRN
  Administered 2024-06-01: 15 mL via PERINEURAL

## 2024-06-01 MED ORDER — METOCLOPRAMIDE HCL 5 MG PO TABS: 5.0000 mg | ORAL_TABLET | Freq: Three times a day (TID) | ORAL | Status: DC | PRN

## 2024-06-01 MED ORDER — PROPOFOL 1000 MG/100ML IV EMUL
INTRAVENOUS | Status: AC
Start: 2024-06-01 — End: 2024-06-01
  Filled 2024-06-01: qty 100

## 2024-06-01 MED ORDER — OXYCODONE HCL 5 MG PO TABS
5.0000 mg | ORAL_TABLET | ORAL | Status: DC | PRN
  Administered 2024-06-01: 10 mg via ORAL
  Filled 2024-06-01: qty 2

## 2024-06-01 MED ORDER — HYDROMORPHONE HCL 1 MG/ML IJ SOLN
0.2500 mg | INTRAMUSCULAR | Status: DC | PRN
  Administered 2024-06-01 (×2): 0.5 mg via INTRAVENOUS

## 2024-06-01 MED ORDER — MIDAZOLAM HCL (PF) 2 MG/2ML IJ SOLN: 1.0000 mg | INTRAMUSCULAR | Status: DC

## 2024-06-01 MED ORDER — PRASUGREL HCL 10 MG PO TABS
10.0000 mg | ORAL_TABLET | Freq: Every day | ORAL | Status: DC
  Administered 2024-06-02: 10 mg via ORAL
  Filled 2024-06-01: qty 1

## 2024-06-01 SURGICAL SUPPLY — 43 items
ATTUNE MED DOME PAT 41 KNEE (Knees) IMPLANT
ATTUNE PS FEM LT SZ 8 CEM KNEE (Femur) IMPLANT
ATTUNE PSRP INSR SZ8 6 KNEE (Insert) IMPLANT
BAG COUNTER SPONGE SURGICOUNT (BAG) IMPLANT
BAG ZIPLOCK 12X15 (MISCELLANEOUS) ×1 IMPLANT
BASE TIBIAL ROT PLAT SZ 7 KNEE (Knees) IMPLANT
BLADE SAG 18X100X1.27 (BLADE) ×1 IMPLANT
BLADE SAW SGTL 11.0X1.19X90.0M (BLADE) ×1 IMPLANT
BNDG ELASTIC 4X5.8 VLCR NS LF (GAUZE/BANDAGES/DRESSINGS) IMPLANT
BNDG ELASTIC 6INX 5YD STR LF (GAUZE/BANDAGES/DRESSINGS) ×1 IMPLANT
BOWL SMART MIX CTS (DISPOSABLE) ×1 IMPLANT
CEMENT HV SMART SET (Cement) ×2 IMPLANT
COVER SURGICAL LIGHT HANDLE (MISCELLANEOUS) ×1 IMPLANT
CUFF TRNQT CYL 34X4.125X (TOURNIQUET CUFF) ×1 IMPLANT
DERMABOND ADVANCED .7 DNX12 (GAUZE/BANDAGES/DRESSINGS) ×1 IMPLANT
DRAPE U-SHAPE 47X51 STRL (DRAPES) ×1 IMPLANT
DRESSING AQUACEL AG SP 3.5X10 (GAUZE/BANDAGES/DRESSINGS) IMPLANT
DRSG AQUACEL AG ADV 3.5X10 (GAUZE/BANDAGES/DRESSINGS) ×1 IMPLANT
DURAPREP 26ML APPLICATOR (WOUND CARE) ×1 IMPLANT
ELECT REM PT RETURN 15FT ADLT (MISCELLANEOUS) ×1 IMPLANT
GLOVE BIO SURGEON STRL SZ 6.5 (GLOVE) IMPLANT
GLOVE BIO SURGEON STRL SZ8 (GLOVE) ×1 IMPLANT
GLOVE BIOGEL PI IND STRL 7.0 (GLOVE) ×1 IMPLANT
GLOVE BIOGEL PI IND STRL 8 (GLOVE) ×1 IMPLANT
GOWN STRL REUS W/ TWL LRG LVL3 (GOWN DISPOSABLE) ×1 IMPLANT
HOLDER FOLEY CATH W/STRAP (MISCELLANEOUS) ×1 IMPLANT
IMMOBILIZER KNEE 20 THIGH 36 (SOFTGOODS) ×1 IMPLANT
KIT TURNOVER KIT A (KITS) ×1 IMPLANT
MANIFOLD NEPTUNE II (INSTRUMENTS) ×1 IMPLANT
NS IRRIG 1000ML POUR BTL (IV SOLUTION) ×1 IMPLANT
PACK TOTAL KNEE CUSTOM (KITS) ×1 IMPLANT
PADDING CAST COTTON 6X4 STRL (CAST SUPPLIES) ×2 IMPLANT
PENCIL SMOKE EVACUATOR (MISCELLANEOUS) ×1 IMPLANT
PIN STEINMAN FIXATION KNEE (PIN) IMPLANT
PROTECTOR NERVE ULNAR (MISCELLANEOUS) ×1 IMPLANT
SET HNDPC FAN SPRY TIP SCT (DISPOSABLE) ×1 IMPLANT
SUT MNCRL AB 4-0 PS2 18 (SUTURE) ×1 IMPLANT
SUT VIC AB 2-0 CT1 TAPERPNT 27 (SUTURE) ×3 IMPLANT
SUTURE STRATFX 0 PDS 27 VIOLET (SUTURE) ×1 IMPLANT
TOWEL GREEN STERILE FF (TOWEL DISPOSABLE) ×1 IMPLANT
TRAY FOLEY MTR SLVR 16FR STAT (SET/KITS/TRAYS/PACK) ×1 IMPLANT
TUBE SUCTION HIGH CAP CLEAR NV (SUCTIONS) ×1 IMPLANT
WRAP KNEE MAXI GEL POST OP (GAUZE/BANDAGES/DRESSINGS) ×1 IMPLANT

## 2024-06-01 NOTE — Discharge Instructions (Signed)
 Patrick Moan, MD Total Joint Specialist [facility] [place] [address] [place], [place] [address] [phone]  TOTAL KNEE REPLACEMENT POSTOPERATIVE DIRECTIONS    Knee Rehabilitation, Guidelines Following Surgery  Results after knee surgery are often greatly improved when you follow the exercise, range of motion and muscle strengthening exercises prescribed by your doctor. Safety measures are also important to protect the knee from further injury. If any of these exercises cause you to have increased pain or swelling in your knee joint, decrease the amount until you are comfortable again and slowly increase them. If you have problems or questions, call your caregiver or physical therapist for advice.   BLOOD CLOT PREVENTION Take 81 mg Aspirin  two times a day for three weeks following surgery with chronic DVT prophylaxis medication. Then take an 81 mg Aspirin  once a day for three weeks. Then discontinue Aspirin . You may resume your vitamins/supplements upon discharge from the hospital. Do not take any NSAIDs (Advil, Aleve , Ibuprofen, Meloxicam , etc.) for 3 weeks, while taking 81mg  Aspirin  twice a day.   HOME CARE INSTRUCTIONS  Remove items at home which could result in a fall. This includes throw rugs or furniture in walking pathways.  ICE to the affected knee as much as tolerated. Icing helps control swelling. If the swelling is well controlled you will be more comfortable and rehab easier. Continue to use ice on the knee for pain and swelling from surgery. You may notice swelling that will progress down to the foot and ankle. This is normal after surgery. Elevate the leg when you are not up walking on it.    Continue to use the breathing machine which will help keep your temperature down. It is common for your temperature to cycle up and down following surgery, especially at night when you are not up moving around and exerting yourself. The breathing machine keeps  your lungs expanded and your temperature down. Do not place pillow under the operative knee, focus on keeping the knee straight while resting  DIET You may resume your previous home diet once you are discharged from the hospital.  DRESSING / WOUND CARE / SHOWERING Keep your bulky bandage on for 2 days. On the third post-operative day you may remove the Ace bandage and gauze. There is a waterproof adhesive bandage on your skin which will stay in place until your first follow-up appointment. Once you remove this you will not need to place another bandage You may begin showering 3 days following surgery, but do not submerge the incision under water .  ACTIVITY For the first 5 days, the key is rest and control of pain and swelling Do your home exercises twice a day starting on post-operative day 3. On the days you go to physical therapy, just do the home exercises once that day. You should rest, ice and elevate the leg for 50 minutes out of every hour. Get up and walk/stretch for 10 minutes per hour. After 5 days you can increase your activity slowly as tolerated. Walk with your walker as instructed. Use the walker until you are comfortable transitioning to a cane. Walk with the cane in the opposite hand of the operative leg. You may discontinue the cane once you are comfortable and walking steadily. Avoid periods of inactivity such as sitting longer than an hour when not asleep. This helps prevent blood clots.  You may discontinue the knee immobilizer once you are able to perform a straight leg raise while lying down. You may  resume a sexual relationship in one month or when given the OK by your doctor.  You may return to work once you are cleared by your doctor.  Do not drive a car for 6 weeks or until released by your surgeon.  Do not drive while taking narcotics.  TED HOSE STOCKINGS Wear the elastic stockings on both legs for three weeks following surgery during the day. You may remove them at  night for sleeping.  WEIGHT BEARING Weight bearing as tolerated with assist device (walker, cane, etc) as directed, use it as long as suggested by your surgeon or therapist, typically at least 4-6 weeks.  POSTOPERATIVE CONSTIPATION PROTOCOL Constipation - defined medically as fewer than three stools per week and severe constipation as less than one stool per week.  One of the most common issues patients have following surgery is constipation.  Even if you have a regular bowel pattern at home, your normal regimen is likely to be disrupted due to multiple reasons following surgery.  Combination of anesthesia, postoperative narcotics, change in appetite and fluid intake all can affect your bowels.  In order to avoid complications following surgery, here are some recommendations in order to help you during your recovery period.  Colace (docusate) - Pick up an over-the-counter form of Colace or another stool softener and take twice a day as long as you are requiring postoperative pain medications.  Take with a full glass of water  daily.  If you experience loose stools or diarrhea, hold the colace until you stool forms back up. If your symptoms do not get better within 1 week or if they get worse, check with your doctor. Dulcolax (bisacodyl ) - Pick up over-the-counter and take as directed by the product packaging as needed to assist with the movement of your bowels.  Take with a full glass of water .  Use this product as needed if not relieved by Colace only.  MiraLax  (polyethylene glycol) - Pick up over-the-counter to have on hand. MiraLax  is a solution that will increase the amount of water  in your bowels to assist with bowel movements.  Take as directed and can mix with a glass of water , juice, soda, coffee, or tea. Take if you go more than two days without a movement. Do not use MiraLax  more than once per day. Call your doctor if you are still constipated or irregular after using this medication for 7 days  in a row.  If you continue to have problems with postoperative constipation, please contact the office for further assistance and recommendations.  If you experience the worst abdominal pain ever or develop nausea or vomiting, please contact the office immediatly for further recommendations for treatment.  ITCHING If you experience itching with your medications, try taking only a single pain pill, or even half a pain pill at a time.  You can also use Benadryl  over the counter for itching or also to help with sleep.   MEDICATIONS See your medication summary on the "After Visit Summary" that the nursing staff will review with you prior to discharge.  You may have some home medications which will be placed on hold until you complete the course of blood thinner medication.  It is important for you to complete the blood thinner medication as prescribed by your surgeon.  Continue your approved medications as instructed at time of discharge.  PRECAUTIONS If you experience chest pain or shortness of breath - call 911 immediately for transfer to the hospital emergency department.  If you  develop a fever greater that 101 F, purulent drainage from wound, increased redness or drainage from wound, foul odor from the wound/dressing, or calf pain - CONTACT YOUR SURGEON.                                                   FOLLOW-UP APPOINTMENTS Make sure you keep all of your appointments after your operation with your surgeon and caregivers. You should call the office at the above phone number and make an appointment for approximately two weeks after the date of your surgery or on the date instructed by your surgeon outlined in the After Visit Summary.  RANGE OF MOTION AND STRENGTHENING EXERCISES  Rehabilitation of the knee is important following a knee injury or an operation. After just a few days of immobilization, the muscles of the thigh which control the knee become weakened and shrink (atrophy). Knee  exercises are designed to build up the tone and strength of the thigh muscles and to improve knee motion. Often times heat used for twenty to thirty minutes before working out will loosen up your tissues and help with improving the range of motion but do not use heat for the first two weeks following surgery. These exercises can be done on a training (exercise) mat, on the floor, on a table or on a bed. Use what ever works the best and is most comfortable for you Knee exercises include:  Leg Lifts - While your knee is still immobilized in a splint or cast, you can do straight leg raises. Lift the leg to 60 degrees, hold for 3 sec, and slowly lower the leg. Repeat 10-20 times 2-3 times daily. Perform this exercise against resistance later as your knee gets better.  Quad and Hamstring Sets - Tighten up the muscle on the front of the thigh (Quad) and hold for 5-10 sec. Repeat this 10-20 times hourly. Hamstring sets are done by pushing the foot backward against an object and holding for 5-10 sec. Repeat as with quad sets.  Leg Slides: Lying on your back, slowly slide your foot toward your buttocks, bending your knee up off the floor (only go as far as is comfortable). Then slowly slide your foot back down until your leg is flat on the floor again. Angel Wings: Lying on your back spread your legs to the side as far apart as you can without causing discomfort.  A rehabilitation program following serious knee injuries can speed recovery and prevent re-injury in the future due to weakened muscles. Contact your doctor or a physical therapist for more information on knee rehabilitation.   POST-OPERATIVE OPIOID TAPER INSTRUCTIONS: It is important to wean off of your opioid medication as soon as possible. If you do not need pain medication after your surgery it is ok to stop day one. Opioids include: Codeine, Hydrocodone (Norco, Vicodin), Oxycodone (Percocet, oxycontin ) and hydromorphone  amongst others.  Long term and  even short term use of opiods can cause: Increased pain response Dependence Constipation Depression Respiratory depression And more.  Withdrawal symptoms can include Flu like symptoms Nausea, vomiting And more Techniques to manage these symptoms Hydrate well Eat regular healthy meals Stay active Use relaxation techniques(deep breathing, meditating, yoga) Do Not substitute Alcohol to help with tapering If you have been on opioids for less than two weeks and do not have pain than it is  ok to stop all together.  Plan to wean off of opioids This plan should start within one week post op of your joint replacement. Maintain the same interval or time between taking each dose and first decrease the dose.  Cut the total daily intake of opioids by one tablet each day Next start to increase the time between doses. The last dose that should be eliminated is the evening dose.   IF YOU ARE TRANSFERRED TO A SKILLED REHAB FACILITY If the patient is transferred to a skilled rehab facility following release from the hospital, a list of the current medications will be sent to the facility for the patient to continue.  When discharged from the skilled rehab facility, please have the facility set up the patient's Home Health Physical Therapy prior to being released. Also, the skilled facility will be responsible for providing the patient with their medications at time of release from the facility to include their pain medication, the muscle relaxants, and their blood thinner medication. If the patient is still at the rehab facility at time of the two week follow up appointment, the skilled rehab facility will also need to assist the patient in arranging follow up appointment in our office and any transportation needs.  MAKE SURE YOU:  Understand these instructions.  Get help right away if you are not doing well or get worse.   DENTAL ANTIBIOTICS:  In most cases prophylactic antibiotics for Dental  procdeures after total joint surgery are not necessary.  Exceptions are as follows:  1. History of prior total joint infection  2. Severely immunocompromised (Organ Transplant, cancer chemotherapy, Rheumatoid biologic meds such as Humera)  3. Poorly controlled diabetes (A1C &gt; 8.0, blood glucose over 200)  If you have one of these conditions, contact your surgeon for an antibiotic prescription, prior to your dental procedure.    Pick up stool softner and laxative for home use following surgery while on pain medications. Do not submerge incision under water . Please use good hand washing techniques while changing dressing each day. May shower starting three days after surgery. Please use a clean towel to pat the incision dry following showers. Continue to use ice for pain and swelling after surgery. Do not use any lotions or creams on the incision until instructed by your surgeon.

## 2024-06-01 NOTE — Interval H&P Note (Signed)
 History and Physical Interval Note:  06/01/2024 6:33 AM  Patrick Rubio  has presented today for surgery, with the diagnosis of Left knee osteoarthritis.  The various methods of treatment have been discussed with the patient and family. After consideration of risks, benefits and other options for treatment, the patient has consented to  Procedure(s): ARTHROPLASTY, KNEE, TOTAL (Left) as a surgical intervention.  The patient's history has been reviewed, patient examined, no change in status, stable for surgery.  I have reviewed the patient's chart and labs.  Questions were answered to the patient's satisfaction.     [name]

## 2024-06-01 NOTE — Progress Notes (Signed)
 Orthopedic Tech Progress Note Patient Details:  Patrick Rubio 29-Dec-1960 [id number]  Patient ID: Alm Ao, male   DOB: [birth year], 62 y.o.   MRN: [id number] Removed CPM from pt.  [name] 06/01/2024, 2:35 PM

## 2024-06-01 NOTE — Evaluation (Signed)
 Physical Therapy Evaluation Patient Details Name: Patrick Rubio MRN: [id number] DOB: 1960-12-26 Today's Date: 06/01/2024  History of Present Illness  63 yo male s/p LTKA> PMH RTKA ,CHF, CAD, MI, lymphoma, stomach ca, back sg  Clinical Impression   Patient ambulated x 42' with RW. Should progress to Dc tomorrow. Pt admitted with above diagnosis.  Pt currently with functional limitations due to the deficits listed below (see PT Problem List). Pt will benefit from acute skilled PT to increase their independence and safety with mobility to allow discharge.           If plan is discharge home, recommend the following: A little help with walking and/or transfers;A little help with bathing/dressing/bathroom;Assist for transportation;Help with stairs or ramp for entrance   Can travel by private vehicle        Equipment Recommendations None recommended by PT  Recommendations for Other Services       Functional Status Assessment Patient has had a recent decline in their functional status and demonstrates the ability to make significant improvements in function in a reasonable and predictable amount of time.     Precautions / Restrictions Precautions Precautions: Knee;Fall Restrictions Weight Bearing Restrictions Per Provider Order: No      Mobility  Bed Mobility               General bed mobility comments: sitting on bed ege    Transfers Overall transfer level: Needs assistance Equipment used: Rolling walker (2 wheels) Transfers: Sit to/from Stand Sit to Stand: Supervision                Ambulation/Gait Ambulation/Gait assistance: Contact guard assist Gait Distance (Feet): 60 Feet Assistive device: Rolling walker (2 wheels) Gait Pattern/deviations: Step-to pattern, Step-through pattern          Stairs            Wheelchair Mobility     Tilt Bed    Modified Rankin (Stroke Patients Only)       Balance Overall balance assessment: Mild deficits  observed, not formally tested                                           Pertinent Vitals/Pain Pain Assessment Pain Assessment: 0-10 Pain Score: 5  Pain Location: left knee Pain Descriptors / Indicators: Aching Pain Intervention(s): Limited activity within patient's tolerance, Monitored during session, Premedicated before session, Ice applied    Home Living Family/patient expects to be discharged to:: Private residence Living Arrangements: Spouse/significant other Available Help at Discharge: Family;Available 24 hours/day Type of Home: House Home Access: Stairs to enter Entrance Stairs-Rails: Doctor, General Practice of Steps: 2   Home Layout: One level Home Equipment: Agricultural Consultant (2 wheels);Cane - single point      Prior Function Prior Level of Function : Independent/Modified Independent;Driving                     Extremity/Trunk Assessment   Upper Extremity Assessment Upper Extremity Assessment: Overall WFL for tasks assessed    Lower Extremity Assessment Lower Extremity Assessment: LLE deficits/detail LLE Deficits / Details: + SLR    Cervical / Trunk Assessment Cervical / Trunk Assessment: Normal  Communication   Communication Communication: No apparent difficulties    Cognition Arousal: Alert Behavior During Therapy: WFL for tasks assessed/performed   PT - Cognitive impairments: No apparent impairments  Following commands: Intact       Cueing       General Comments      Exercises     Assessment/Plan    PT Assessment Patient needs continued PT services  PT Problem List Decreased strength;Decreased range of motion;Decreased activity tolerance;Decreased mobility;Pain       PT Treatment Interventions DME instruction;Gait training;Stair training;Functional mobility training;Therapeutic activities;Therapeutic exercise;Patient/family education    PT Goals (Current goals can be  found in the Care Plan section)  Acute Rehab PT Goals Patient Stated Goal: go home PT Goal Formulation: With patient/family Time For Goal Achievement: 06/08/24 Potential to Achieve Goals: Good    Frequency 7X/week     Co-evaluation               AM-PAC PT 6 Clicks Mobility  Outcome Measure Help needed turning from your back to your side while in a flat bed without using bedrails?: None Help needed moving from lying on your back to sitting on the side of a flat bed without using bedrails?: None Help needed moving to and from a bed to a chair (including a wheelchair)?: A Little Help needed standing up from a chair using your arms (e.g., wheelchair or bedside chair)?: A Little Help needed to walk in hospital room?: A Little Help needed climbing 3-5 steps with a railing? : A Little 6 Click Score: 20    End of Session Equipment Utilized During Treatment: Gait belt Activity Tolerance: Patient tolerated treatment well Patient left: in chair;with call bell/phone within reach;with chair alarm set;with family/visitor present Nurse Communication: Mobility status PT Visit Diagnosis: Unsteadiness on feet (R26.81);Pain;Muscle weakness (generalized) (M62.81) Pain - Right/Left: Left Pain - part of body: Knee    Time: 8348-8288 PT Time Calculation (min) (ACUTE ONLY): 20 min   Charges:   PT Evaluation $PT Eval Low Complexity: 1 Low   PT General Charges $$ ACUTE PT VISIT: 1 Visit         Darice Potters PT Acute Rehabilitation Services Office [phone]   Potters Darice Norris 06/01/2024, 5:24 PM

## 2024-06-01 NOTE — Anesthesia Postprocedure Evaluation (Signed)
 Anesthesia Post Note  Patient: Patrick Rubio  Procedure(s) Performed: ARTHROPLASTY, KNEE, TOTAL (Left: Knee)     Patient location during evaluation: PACU Anesthesia Type: General Level of consciousness: awake and alert Pain management: pain level controlled Vital Signs Assessment: post-procedure vital signs reviewed and stable Respiratory status: spontaneous breathing, nonlabored ventilation and respiratory function stable Cardiovascular status: blood pressure returned to baseline Postop Assessment: no apparent nausea or vomiting Anesthetic complications: no   No notable events documented.  Last Vitals:  Vitals:   06/01/24 1100 06/01/24 1115  BP: (!) 156/91 (!) 160/94  Pulse: (!) 51 (!) 50  Resp: 17 17  Temp:    SpO2: 91% 92%    Last Pain:  Vitals:   06/01/24 1118  TempSrc:   PainSc: Asleep                 Vertell Row

## 2024-06-01 NOTE — Transfer of Care (Signed)
 Immediate Anesthesia Transfer of Care Note  Patient: Patrick Rubio  Procedure(s) Performed: ARTHROPLASTY, KNEE, TOTAL (Left: Knee)  Patient Location: PACU  Anesthesia Type:General and Regional  Level of Consciousness: sedated and responds to stimulation  Airway & Oxygen Therapy: Patient Spontanous Breathing and Patient connected to face mask oxygen  Post-op Assessment: Report given to RN and Post -op Vital signs reviewed and stable  Post vital signs: Reviewed and stable  Last Vitals:  Vitals Value Taken Time  BP 144/92 06/01/24 09:55  Temp    Pulse 50 06/01/24 09:57  Resp 19 06/01/24 09:57  SpO2 100 % 06/01/24 09:57  Vitals shown include unfiled device data.  Last Pain:  Vitals:   06/01/24 0612  TempSrc:   PainSc: 4          Complications: No notable events documented.

## 2024-06-01 NOTE — Op Note (Signed)
 OPERATIVE REPORT-TOTAL KNEE ARTHROPLASTY   Pre-operative diagnosis- Osteoarthritis  Left knee(s)  Post-operative diagnosis- Osteoarthritis Left knee(s)  Procedure-  Left  Total Knee Arthroplasty  Surgeon- Patrick GAILS. Horatio Bertz, MD  Assistant- Roxie Mess, PA-C   Anesthesia-  GA combined with regional for post-op pain  EBL- 25 ml   Drains None  Tourniquet time-  Total Tourniquet Time Documented: Thigh (Left) - 43 minutes Total: Thigh (Left) - 43 minutes     Complications- None  Condition-PACU - hemodynamically stable.   Brief Clinical Note   Patrick Rubio is a 63 y.o. year old male with end stage OA of his left knee with progressively worsening pain and dysfunction. He has constant pain, with activity and at rest and significant functional deficits with difficulties even with ADLs. He has had extensive non-op management including analgesics, injections of cortisone and viscosupplements, and home exercise program, but remains in significant pain with significant dysfunction. Radiographs show bone on bone arthritis medial and patellofemoral. He presents now for left Total Knee Arthroplasty.     Procedure in detail---   The patient is brought into the operating room and positioned supine on the operating table. After successful administration of  GA combined with regional for post-op pain,   a tourniquet is placed high on the  Left thigh(s) and the lower extremity is prepped and draped in the usual sterile fashion. Time out is performed by the operating team and then the  Left lower extremity is wrapped in Esmarch, knee flexed and the tourniquet inflated to 300 mmHg.       A midline incision is made with a ten blade through the subcutaneous tissue to the level of the extensor mechanism. A fresh blade is used to make a medial parapatellar arthrotomy. Soft tissue over the proximal medial tibia is subperiosteally elevated to the joint line with a knife and into the semimembranosus bursa  with a Cobb elevator. Soft tissue over the proximal lateral tibia is elevated with attention being paid to avoiding the patellar tendon on the tibial tubercle. The patella is everted, knee flexed 90 degrees and the ACL and PCL are removed. Findings are bone on bone medial and patellofemoral with large global osteophytes        The drill is used to create a starting hole in the distal femur and the canal is thoroughly irrigated with sterile saline to remove the fatty contents. The 5 degree Left  valgus alignment guide is placed into the femoral canal and the distal femoral cutting block is pinned to remove 10 mm off the distal femur. Resection is made with an oscillating saw.      The tibia is subluxed forward and the menisci are removed. The extramedullary alignment guide is placed referencing proximally at the medial aspect of the tibial tubercle and distally along the second metatarsal axis and tibial crest. The block is pinned to remove 2mm off the more deficient medial  side. Resection is made with an oscillating saw. Size 7is the most appropriate size for the tibia and the proximal tibia is prepared with the modular drill and keel punch for that size.      The femoral sizing guide is placed and size 8 is most appropriate. Rotation is marked off the epicondylar axis and confirmed by creating a rectangular flexion gap at 90 degrees. The size 8 cutting block is pinned in this rotation and the anterior, posterior and chamfer cuts are made with the oscillating saw. The intercondylar block is then placed  and that cut is made.      Trial size 7 tibial component, trial size 8 posterior stabilized femur and a 6  mm posterior stabilized rotating platform insert trial is placed. Full extension is achieved with excellent varus/valgus and anterior/posterior balance throughout full range of motion. The patella is everted and thickness measured to be 27  mm. Free hand resection is taken to 15 mm, a 41 template is placed,  lug holes are drilled, trial patella is placed, and it tracks normally. Osteophytes are removed off the posterior femur with the trial in place. All trials are removed and the cut bone surfaces prepared with pulsatile lavage. Cement is mixed and once ready for implantation, the size 7 tibial implant, size  8 posterior stabilized femoral component, and the size 41 patella are cemented in place and the patella is held with the clamp. The trial insert is placed and the knee held in full extension. The Exparel  (20 ml mixed with 60 ml saline) is injected into the extensor mechanism, posterior capsule, medial and lateral gutters and subcutaneous tissues.  All extruded cement is removed and once the cement is hard the permanent 6 mm posterior stabilized rotating platform insert is placed into the tibial tray.      The wound is copiously irrigated with saline solution and the extensor mechanism closed with # 0 Stratofix suture. The tourniquet is released for a total tourniquet time of 43  minutes. Flexion against gravity is 140 degrees and the patella tracks normally. Subcutaneous tissue is closed with 2.0 vicryl and subcuticular with running 4.0 Monocryl. The incision is cleaned and dried and steri-strips and a bulky sterile dressing are applied. The limb is placed into a knee immobilizer and the patient is awakened and transported to recovery in stable condition.      Please note that a surgical assistant was a medical necessity for this procedure in order to perform it in a safe and expeditious manner. Surgical assistant was necessary to retract the ligaments and vital neurovascular structures to prevent injury to them and also necessary for proper positioning of the limb to allow for anatomic placement of the prosthesis.   Patrick ROCKFORD Kinzlie Harney, MD    06/01/2024, 9:28 AM

## 2024-06-01 NOTE — Anesthesia Procedure Notes (Signed)
 Procedure Name: LMA Insertion Date/Time: 06/01/2024 8:24 AM  Performed by: Obadiah Reyes BROCKS, CRNAPre-anesthesia Checklist: Patient identified, Emergency Drugs available, Suction available and Patient being monitored Patient Re-evaluated:Patient Re-evaluated prior to induction Oxygen Delivery Method: Circle System Utilized Preoxygenation: Pre-oxygenation with 100% oxygen Induction Type: IV induction Ventilation: Mask ventilation without difficulty LMA: LMA inserted LMA Size: 4.0 Number of attempts: 1 Airway Equipment and Method: Bite block Placement Confirmation: positive ETCO2 Tube secured with: Tape Dental Injury: Teeth and Oropharynx as per pre-operative assessment

## 2024-06-01 NOTE — Anesthesia Procedure Notes (Signed)
 Anesthesia Regional Block: Adductor canal block   Pre-Anesthetic Checklist: , timeout performed,  Correct Patient, Correct Site, Correct Laterality,  Correct Procedure, Correct Position, site marked,  Risks and benefits discussed,  Pre-op evaluation,  At surgeon's request and post-op pain management  Laterality: Left  Prep: Maximum Sterile Barrier Precautions used, chloraprep       Needles:  Injection technique: Single-shot  Needle Type: Echogenic Stimulator Needle     Needle Length: 9cm  Needle Gauge: 22     Additional Needles:   Procedures:,,,, ultrasound used (permanent image in chart),,    Narrative:  Start time: 06/01/2024 7:58 AM End time: 06/01/2024 8:01 AM Injection made incrementally with aspirations every 5 mL.  Performed by: Personally  Anesthesiologist: Paul Lamarr BRAVO, MD  Additional Notes: Risks, benefits, and alternative discussed. Patient gave consent for procedure. Patient prepped and draped in sterile fashion. Sedation administered, patient remains easily responsive to voice. Relevant anatomy identified with ultrasound guidance. Local anesthetic given in 5cc increments with no signs or symptoms of intravascular injection. No pain or paraesthesias with injection. Patient monitored throughout procedure with no signs of LAST or immediate complications. Tolerated well. Ultrasound image placed in chart.  LANEY Paul, MD

## 2024-06-01 NOTE — Progress Notes (Signed)
 Orthopedic Tech Progress Note Patient Details:  Patrick Rubio 1960/09/04 [id number] Applied CPM per order. Will remove at 2:20pm.  CPM Left Knee CPM Left Knee: On Left Knee Flexion (Degrees): 40 Left Knee Extension (Degrees): 10  Post Interventions Patient Tolerated: Well Instructions Provided: Adjustment of device, Care of device, Poper ambulation with device Ortho Devices Type of Ortho Device: CPM padding Ortho Device/Splint Location: LLE Ortho Device/Splint Interventions: Ordered, Application, Adjustment   Post Interventions Patient Tolerated: Well Instructions Provided: Adjustment of device, Care of device, Poper ambulation with device  [name] 06/01/2024, 10:25 AM

## 2024-06-02 ENCOUNTER — Encounter (HOSPITAL_COMMUNITY): Payer: Self-pay | Admitting: Orthopedic Surgery

## 2024-06-02 DIAGNOSIS — M1712 Unilateral primary osteoarthritis, left knee: Secondary | ICD-10-CM | POA: Diagnosis not present

## 2024-06-02 LAB — CBC
HCT: 33.4 % — ABNORMAL LOW (ref 39.0–52.0)
Hemoglobin: 10.7 g/dL — ABNORMAL LOW (ref 13.0–17.0)
MCH: 30.5 pg (ref 26.0–34.0)
MCHC: 32 g/dL (ref 30.0–36.0)
MCV: 95.2 fL (ref 80.0–100.0)
Platelets: 180 K/uL (ref 150–400)
RBC: 3.51 MIL/uL — ABNORMAL LOW (ref 4.22–5.81)
RDW: 12.6 % (ref 11.5–15.5)
WBC: 15.6 K/uL — ABNORMAL HIGH (ref 4.0–10.5)
nRBC: 0 % (ref 0.0–0.2)

## 2024-06-02 LAB — BASIC METABOLIC PANEL WITH GFR
Anion gap: 8 (ref 5–15)
BUN: 13 mg/dL (ref 8–23)
CO2: 24 mmol/L (ref 22–32)
Calcium: 8.6 mg/dL — ABNORMAL LOW (ref 8.9–10.3)
Chloride: 103 mmol/L (ref 98–111)
Creatinine, Ser: 0.62 mg/dL (ref 0.61–1.24)
GFR, Estimated: >60 mL/min (ref >60)
Glucose, Bld: 136 mg/dL — ABNORMAL HIGH (ref 70–99)
Potassium: 4.5 mmol/L (ref 3.5–5.1)
Sodium: 134 mmol/L — ABNORMAL LOW (ref 135–145)

## 2024-06-02 MED ORDER — OXYCODONE HCL 5 MG PO TABS: 5.0000 mg | ORAL_TABLET | Freq: Four times a day (QID) | ORAL | 0 refills | Status: AC | PRN

## 2024-06-02 MED ORDER — ONDANSETRON HCL 4 MG PO TABS: 4.0000 mg | ORAL_TABLET | Freq: Four times a day (QID) | ORAL | 0 refills | Status: AC | PRN

## 2024-06-02 MED ORDER — ASPIRIN 81 MG PO CHEW: 81.0000 mg | CHEWABLE_TABLET | Freq: Two times a day (BID) | ORAL | 0 refills | Status: AC

## 2024-06-02 MED ORDER — METHOCARBAMOL 500 MG PO TABS: 500.0000 mg | ORAL_TABLET | Freq: Four times a day (QID) | ORAL | 0 refills | Status: AC | PRN

## 2024-06-02 NOTE — Progress Notes (Signed)
 Subjective: 1 Day Post-Op Procedure(s) (LRB): ARTHROPLASTY, KNEE, TOTAL (Left) Patient reports pain as mild.   Patient seen in rounds by Dr. Melodi. Patient is well, and has had no acute complaints or problems No issues overnight. Denies chest pain, SOB, or calf pain. Foley catheter removed this AM.  We will continue therapy today, ambulated 60' yesterday.   Objective: Vital signs in last 24 hours: Temp:  [97.4 F (36.3 C)-98.1 F (36.7 C)] 97.7 F (36.5 C) (11/04 0622) Pulse Rate:  [49-58] 55 (11/04 0622) Resp:  [15-20] 17 (11/04 0622) BP: (104-169)/(71-100) 104/76 (11/04 0622) SpO2:  [91 %-100 %] 97 % (11/04 0622)  Intake/Output from previous day:  Intake/Output Summary (Last 24 hours) at 06/02/2024 0828 Last data filed at 06/02/2024 0624 Gross per 24 hour  Intake 3276.92 ml  Output 1550 ml  Net 1726.92 ml     Intake/Output this shift: No intake/output data recorded.  Labs: Recent Labs    06/02/24 0358  HGB 10.7*   Recent Labs    06/02/24 0358  WBC 15.6*  RBC 3.51*  HCT 33.4*  PLT 180   Recent Labs    06/02/24 0358  NA 134*  K 4.5  CL 103  CO2 24  BUN 13  CREATININE 0.62  GLUCOSE 136*  CALCIUM 8.6*   No results for input(s): LABPT, INR in the last 72 hours.  Exam: General - Patient is Alert and Oriented Extremity - Neurologically intact Neurovascular intact Sensation intact distally Dorsiflexion/Plantar flexion intact Dressing - dressing C/D/I Motor Function - intact, moving foot and toes well on exam.   Past Medical History:  Diagnosis Date   Arthritis    CAD (coronary artery disease)    CHF (congestive heart failure) (HCC)    Complication of anesthesia    Back surgery in 2003 - woke up after and had to have a breathing tx- 6 hour surgery   History of chemotherapy    2022   History of kidney stones    HTN (hypertension)    Hypercholesterolemia    MI (myocardial infarction) (HCC) 2003   Small lymphocytic lymphoma (HCC)     Stomach cancer (HCC)     Assessment/Plan: 1 Day Post-Op Procedure(s) (LRB): ARTHROPLASTY, KNEE, TOTAL (Left) Principal Problem:   OA (osteoarthritis) of knee Active Problems:   Primary osteoarthritis of left knee  Estimated body mass index is 28.15 kg/m as calculated from the following:   Height as of this encounter: 5' 10 (1.778 m).   Weight as of this encounter: 89 kg. Advance diet Up with therapy D/C IV fluids   Patient's anticipated LOS is less than 2 midnights, meeting these requirements: - Younger than 38 - Lives within 1 hour of care - Has a competent adult at home to recover with post-op recover - NO history of  - Chronic pain requiring opioids  - Diabetes  - Heart failure  - Heart attack  - Stroke  - DVT/VTE  - Cardiac arrhythmia  - Respiratory Failure/COPD  - Renal failure  - Anemia  - Advanced Liver disease    DVT Prophylaxis - Aspirin  Weight bearing as tolerated. Continue therapy.  Plan is to go Home after hospital stay. Plan for discharge later today if progresses with therapy and meeting goals. Scheduled for [facility]PPT at [facility]. Follow-up in the office in 2 weeks.  The PDMP database was reviewed today prior to any opioid medications being prescribed to this patient.  Roxie Mess, PA-C Orthopedic Surgery [phone] 06/02/2024, 8:28  AM

## 2024-06-02 NOTE — TOC Transition Note (Signed)
 Transition of Care [facility]) - Discharge Note   Patient Details  Name: Patrick Rubio MRN: [id number] Date of Birth: October 13, 1960  [facility] CM/SW Contact:  Alfonse JONELLE Rex, RN Phone Number: 06/02/2024, 11:09 AM   Clinical Narrative:   Met with patient at bedside to review dc therapy and home equipment needs, pt confirmed OPPT-[facility] om [place], [place], has RW. No TOC needs.     Final next level of care: OP Rehab Barriers to Discharge: No Barriers Identified   Patient Goals and CMS Choice Patient states their goals for this hospitalization and ongoing recovery are:: return home          Discharge Placement                       Discharge Plan and Services Additional resources added to the After Visit Summary for                                       Social Drivers of Health (SDOH) Interventions SDOH Screenings   Food Insecurity: No Food Insecurity (06/01/2024)  Housing: Low Risk  (06/01/2024)  Transportation Needs: No Transportation Needs (06/01/2024)  Utilities: Not At Risk (06/01/2024)  Alcohol Screen: Low Risk  (06/22/2020)  Depression (PHQ2-9): Low Risk  (06/22/2020)  Financial Resource Strain: Low Risk  (06/22/2020)  Physical Activity: Inactive (06/22/2020)  Social Connections: Unknown (06/01/2024)  Stress: No Stress Concern Present (06/22/2020)  Tobacco Use: High Risk (06/01/2024)     Readmission Risk Interventions     No data to display

## 2024-06-02 NOTE — Plan of Care (Signed)
   Problem: Coping: Goal: Level of anxiety will decrease Outcome: Progressing   Problem: Pain Managment: Goal: General experience of comfort will improve and/or be controlled Outcome: Progressing   Problem: Safety: Goal: Ability to remain free from injury will improve Outcome: Progressing

## 2024-06-02 NOTE — Progress Notes (Signed)
 Physical Therapy Treatment Patient Details Name: Patrick Rubio MRN: [id number] DOB: 1961-05-03 Today's Date: 06/02/2024   History of Present Illness 63 yo male s/p LTKA> PMH RTKA ,CHF, CAD, MI, lymphoma, stomach ca, back sg    PT Comments   Patient is progressing well. Patient has met PT goals for DC.     If plan is discharge home, recommend the following: A little help with walking and/or transfers;A little help with bathing/dressing/bathroom;Assist for transportation;Help with stairs or ramp for entrance   Can travel by private vehicle        Equipment Recommendations  None recommended by PT    Recommendations for Other Services       Precautions / Restrictions Precautions Precautions: Knee;Fall Restrictions LLE Weight Bearing Per Provider Order: Weight bearing as tolerated     Mobility  Bed Mobility Overal bed mobility: Needs Assistance Bed Mobility: Supine to Sit, Sit to Supine     Supine to sit: Modified independent (Device/Increase time) Sit to supine: Modified independent (Device/Increase time)        Transfers   Equipment used: Rolling walker (2 wheels) Transfers: Sit to/from Stand Sit to Stand: Supervision                Ambulation/Gait Ambulation/Gait assistance: Supervision Gait Distance (Feet): 120 Feet Assistive device: Rolling walker (2 wheels) Gait Pattern/deviations: Step-to pattern, Step-through pattern Gait velocity: decr     General Gait Details: cues to roll RW, cues inside RW   Stairs Stairs: Yes Stairs assistance: Supervision Stair Management: Step to pattern Number of Stairs: 2 General stair comments: cues for sequence, negotiated 2 but simulated as 1, cues for proper sequence several times   Wheelchair Mobility     Tilt Bed    Modified Rankin (Stroke Patients Only)       Balance Overall balance assessment: Mild deficits observed, not formally tested                                           Communication    Cognition Arousal: Alert Behavior During Therapy: [facility] for tasks assessed/performed                                    Cueing    Exercises Total Joint Exercises Ankle Circles/Pumps: AROM, Both, 10 reps Quad Sets: AROM, Left, 10 reps Straight Leg Raises: AAROM, Left, 10 reps Long Arc Quad: AROM, Left, 5 reps    General Comments        Pertinent Vitals/Pain Pain Assessment Pain Score: 5  Pain Location: left knee Pain Descriptors / Indicators: Aching Pain Intervention(s): Monitored during session, Premedicated before session    Home Living                          Prior Function            PT Goals (current goals can now be found in the care plan section) Progress towards PT goals: Progressing toward goals    Frequency    7X/week      PT Plan      Co-evaluation              AM-PAC PT 6 Clicks Mobility   Outcome Measure  Help needed turning from your back to your side while  in a flat bed without using bedrails?: None Help needed moving from lying on your back to sitting on the side of a flat bed without using bedrails?: None Help needed moving to and from a bed to a chair (including a wheelchair)?: A Little Help needed standing up from a chair using your arms (e.g., wheelchair or bedside chair)?: A Little Help needed to walk in hospital room?: A Little Help needed climbing 3-5 steps with a railing? : A Little 6 Click Score: 20    End of Session Equipment Utilized During Treatment: Gait belt Activity Tolerance: Patient tolerated treatment well Patient left: with call bell/phone within reach;with family/visitor present Nurse Communication: Mobility status PT Visit Diagnosis: Unsteadiness on feet (R26.81);Pain;Muscle weakness (generalized) (M62.81) Pain - Right/Left: Left Pain - part of body: Knee     Time: 8940-8870 PT Time Calculation (min) (ACUTE ONLY): 30 min  Charges:    $Gait Training: 8-22  mins $Therapeutic Exercise: 8-22 mins PT General Charges $$ ACUTE PT VISIT: 1 Visit                     Darice Potters PT Acute Rehabilitation Services Office [phone]    Potters Darice Norris 06/02/2024, 1:27 PM

## 2024-06-03 NOTE — Discharge Summary (Signed)
 Patient ID: Patrick Rubio MRN: [id number] DOB/AGE: 1960/11/19 63 y.o.  Admit date: 06/01/2024 Discharge date: 06/02/2024  Admission Diagnoses:  Principal Problem:   OA (osteoarthritis) of knee Active Problems:   Primary osteoarthritis of left knee   Discharge Diagnoses:  Same  Past Medical History:  Diagnosis Date   Arthritis    CAD (coronary artery disease)    CHF (congestive heart failure) (HCC)    Complication of anesthesia    Back surgery in 2003 - woke up after and had to have a breathing tx- 6 hour surgery   History of chemotherapy    2022   History of kidney stones    HTN (hypertension)    Hypercholesterolemia    MI (myocardial infarction) (HCC) 2003   Small lymphocytic lymphoma (HCC)    Stomach cancer (HCC)     Surgeries: Procedure(s): ARTHROPLASTY, KNEE, TOTAL on 06/01/2024   Consultants:   Discharged Condition: Improved  Hospital Course: Jyles Sontag is an 63 y.o. male who was admitted 06/01/2024 for operative treatment ofOA (osteoarthritis) of knee. Patient has severe unremitting pain that affects sleep, daily activities, and work/hobbies. After pre-op clearance the patient was taken to the operating room on 06/01/2024 and underwent  Procedure(s): ARTHROPLASTY, KNEE, TOTAL.    Patient was given perioperative antibiotics:  Anti-infectives (From admission, onward)    Start     Dose/Rate Route Frequency Ordered Stop   06/01/24 1500  ceFAZolin  (ANCEF ) IVPB 2g/100 mL premix        2 g 200 mL/hr over 30 Minutes Intravenous Every 6 hours 06/01/24 1219 06/02/24 0947   06/01/24 0615  ceFAZolin  (ANCEF ) IVPB 2g/100 mL premix        2 g 200 mL/hr over 30 Minutes Intravenous On call to O.R. 06/01/24 9388 06/01/24 0825        Patient was given sequential compression devices, early ambulation, and chemoprophylaxis to prevent DVT.  Patient benefited maximally from hospital stay and there were no complications.    Recent vital signs: Patient Vitals for the past 24  hrs:  BP Pulse Resp SpO2  06/02/24 1144 124/82 60 -- 99 %  06/02/24 0946 (!) 96/59 (!) 57 17 97 %     Recent laboratory studies:  Recent Labs    06/02/24 0358  WBC 15.6*  HGB 10.7*  HCT 33.4*  PLT 180  NA 134*  K 4.5  CL 103  CO2 24  BUN 13  CREATININE 0.62  GLUCOSE 136*  CALCIUM 8.6*     Discharge Medications:   Allergies as of 06/02/2024   No Known Allergies      Medication List     STOP taking these medications    baclofen  10 MG tablet Commonly known as: LIORESAL    celecoxib 200 MG capsule Commonly known as: CELEBREX       TAKE these medications    acetaminophen  650 MG CR tablet Commonly known as: TYLENOL  Take 650 mg by mouth in the morning and at bedtime.   amoxicillin 500 MG tablet Commonly known as: AMOXIL Take 2,000 mg by mouth See admin instructions. Take 4 tablets (2000 mg) by mouth 1 hour prior to dental treatment   aspirin  81 MG chewable tablet Chew 1 tablet (81 mg total) by mouth 2 (two) times daily for 21 days. Then take one 81 mg aspirin  once a day for three weeks. Then discontinue aspirin .   carvedilol  6.25 MG tablet Commonly known as: COREG  Take 6.25 mg by mouth 2 (two) times daily.   Entresto   49-51 MG Generic drug: sacubitril -valsartan  Take 1 tablet by mouth 2 (two) times daily.   escitalopram  20 MG tablet Commonly known as: LEXAPRO  Take 20 mg by mouth in the morning.   gabapentin  300 MG capsule Commonly known as: NEURONTIN  Take 300 mg by mouth 2 (two) times daily.   Livalo  4 MG Tabs Generic drug: Pitavastatin  Calcium Take 4 mg by mouth every evening.   methocarbamol  500 MG tablet Commonly known as: ROBAXIN  Take 1 tablet (500 mg total) by mouth every 6 (six) hours as needed for muscle spasms.   nitroGLYCERIN  0.4 MG SL tablet Commonly known as: NITROSTAT  Place 0.4 mg under the tongue every 5 (five) minutes x 3 doses as needed for chest pain.   ondansetron  4 MG tablet Commonly known as: ZOFRAN  Take 1 tablet (4 mg  total) by mouth every 6 (six) hours as needed for nausea.   oxyCODONE  5 MG immediate release tablet Commonly known as: Oxy IR/ROXICODONE  Take 1-2 tablets (5-10 mg total) by mouth every 6 (six) hours as needed for severe pain (pain score 7-10) or moderate pain (pain score 4-6).   pantoprazole  40 MG tablet Commonly known as: PROTONIX  TAKE 1 TABLET BY MOUTH EVERY DAY   prasugrel  10 MG Tabs tablet Commonly known as: EFFIENT  Take 10 mg by mouth in the morning.   VITAMIN D3 PO Take 1 tablet by mouth in the morning.               Discharge Care Instructions  (From admission, onward)           Start     Ordered   06/02/24 0000  Weight bearing as tolerated        06/02/24 0830   06/02/24 0000  Change dressing       Comments: You may remove the bulky bandage (ACE wrap and gauze) two days after surgery. You will have an adhesive waterproof bandage underneath. Leave this in place until your first follow-up appointment.   06/02/24 0830            Diagnostic Studies: No results found.  Disposition: Discharge disposition: 01-Home or Self Care       Discharge Instructions     Call MD / Call 911   Complete by: As directed    If you experience chest pain or shortness of breath, CALL 911 and be transported to the hospital emergency room.  If you develope a fever above 101 F, pus (white drainage) or increased drainage or redness at the wound, or calf pain, call your surgeon's office.   Change dressing   Complete by: As directed    You may remove the bulky bandage (ACE wrap and gauze) two days after surgery. You will have an adhesive waterproof bandage underneath. Leave this in place until your first follow-up appointment.   Constipation Prevention   Complete by: As directed    Drink plenty of fluids.  Prune juice may be helpful.  You may use a stool softener, such as Colace (over the counter) 100 mg twice a day.  Use MiraLax  (over the counter) for constipation as needed.    Diet - low sodium heart healthy   Complete by: As directed    Do not put a pillow under the knee. Place it under the heel.   Complete by: As directed    Driving restrictions   Complete by: As directed    No driving for two weeks   Post-operative opioid taper instructions:   Complete by:  As directed    POST-OPERATIVE OPIOID TAPER INSTRUCTIONS: It is important to wean off of your opioid medication as soon as possible. If you do not need pain medication after your surgery it is ok to stop day one. Opioids include: Codeine, Hydrocodone (Norco, Vicodin), Oxycodone (Percocet, oxycontin ) and hydromorphone  amongst others.  Long term and even short term use of opiods can cause: Increased pain response Dependence Constipation Depression Respiratory depression And more.  Withdrawal symptoms can include Flu like symptoms Nausea, vomiting And more Techniques to manage these symptoms Hydrate well Eat regular healthy meals Stay active Use relaxation techniques(deep breathing, meditating, yoga) Do Not substitute Alcohol to help with tapering If you have been on opioids for less than two weeks and do not have pain than it is ok to stop all together.  Plan to wean off of opioids This plan should start within one week post op of your joint replacement. Maintain the same interval or time between taking each dose and first decrease the dose.  Cut the total daily intake of opioids by one tablet each day Next start to increase the time between doses. The last dose that should be eliminated is the evening dose.      TED hose   Complete by: As directed    Use stockings (TED hose) for three weeks on both leg(s).  You may remove them at night for sleeping.   Weight bearing as tolerated   Complete by: As directed         Follow-up Information     Aluisio, Dempsey, MD. Schedule an appointment as soon as possible for a visit in 2 week(s).   Specialty: Orthopedic Surgery Contact  information: [address] [place] [address] [phone]                  Signed: Roxie Mess 06/03/2024, 8:16 AM

## 2025-03-02 ENCOUNTER — Other Ambulatory Visit

## 2025-03-09 ENCOUNTER — Ambulatory Visit: Admitting: Oncology
